# Patient Record
Sex: Male | Born: 1945 | Smoking: Former smoker
Health system: Southern US, Community
[De-identification: ages and names within clinical notes are randomized; demographics above are authoritative.]

## PROBLEM LIST (undated history)

## (undated) DIAGNOSIS — I10 Essential (primary) hypertension: Secondary | ICD-10-CM

## (undated) DIAGNOSIS — I219 Acute myocardial infarction, unspecified: Secondary | ICD-10-CM

## (undated) DIAGNOSIS — K5790 Diverticulosis of intestine, part unspecified, without perforation or abscess without bleeding: Secondary | ICD-10-CM

## (undated) DIAGNOSIS — K219 Gastro-esophageal reflux disease without esophagitis: Secondary | ICD-10-CM

## (undated) DIAGNOSIS — E079 Disorder of thyroid, unspecified: Secondary | ICD-10-CM

## (undated) DIAGNOSIS — A419 Sepsis, unspecified organism: Secondary | ICD-10-CM

## (undated) DIAGNOSIS — I2510 Atherosclerotic heart disease of native coronary artery without angina pectoris: Secondary | ICD-10-CM

## (undated) DIAGNOSIS — J449 Chronic obstructive pulmonary disease, unspecified: Secondary | ICD-10-CM

## (undated) DIAGNOSIS — K7030 Alcoholic cirrhosis of liver without ascites: Secondary | ICD-10-CM

## (undated) DIAGNOSIS — B1910 Unspecified viral hepatitis B without hepatic coma: Secondary | ICD-10-CM

## (undated) DIAGNOSIS — F32A Depression, unspecified: Secondary | ICD-10-CM

## (undated) DIAGNOSIS — F329 Major depressive disorder, single episode, unspecified: Secondary | ICD-10-CM

## (undated) DIAGNOSIS — R1310 Dysphagia, unspecified: Secondary | ICD-10-CM

## (undated) DIAGNOSIS — R339 Retention of urine, unspecified: Secondary | ICD-10-CM

## (undated) DIAGNOSIS — I4891 Unspecified atrial fibrillation: Secondary | ICD-10-CM

## (undated) HISTORY — DX: Acute myocardial infarction, unspecified: I21.9

## (undated) HISTORY — PX: COLOSTOMY: SHX63

## (undated) HISTORY — DX: Essential (primary) hypertension: I10

## (undated) HISTORY — DX: Sepsis, unspecified organism: A41.9

## (undated) HISTORY — DX: Chronic obstructive pulmonary disease, unspecified: J44.9

## (undated) HISTORY — DX: Dysphagia, unspecified: R13.10

## (undated) HISTORY — DX: Unspecified atrial fibrillation: I48.91

## (undated) HISTORY — DX: Gastro-esophageal reflux disease without esophagitis: K21.9

## (undated) HISTORY — DX: Diverticulosis of intestine, part unspecified, without perforation or abscess without bleeding: K57.90

## (undated) HISTORY — DX: Unspecified viral hepatitis B without hepatic coma: B19.10

## (undated) HISTORY — PX: PEG TUBE REMOVAL: SHX2187

## (undated) HISTORY — PX: PEG TUBE PLACEMENT: SUR1034

---

## 2013-03-03 ENCOUNTER — Encounter: Payer: Self-pay | Admitting: Internal Medicine

## 2013-03-03 ENCOUNTER — Non-Acute Institutional Stay (SKILLED_NURSING_FACILITY): Payer: Medicare Other | Admitting: Internal Medicine

## 2013-03-03 DIAGNOSIS — R5383 Other fatigue: Secondary | ICD-10-CM

## 2013-03-03 DIAGNOSIS — R5381 Other malaise: Secondary | ICD-10-CM

## 2013-03-03 DIAGNOSIS — F3289 Other specified depressive episodes: Secondary | ICD-10-CM

## 2013-03-03 DIAGNOSIS — R531 Weakness: Secondary | ICD-10-CM

## 2013-03-03 DIAGNOSIS — F329 Major depressive disorder, single episode, unspecified: Secondary | ICD-10-CM

## 2013-03-03 DIAGNOSIS — E039 Hypothyroidism, unspecified: Secondary | ICD-10-CM

## 2013-03-03 DIAGNOSIS — K219 Gastro-esophageal reflux disease without esophagitis: Secondary | ICD-10-CM

## 2013-03-03 DIAGNOSIS — F32A Depression, unspecified: Secondary | ICD-10-CM

## 2013-03-03 DIAGNOSIS — L89109 Pressure ulcer of unspecified part of back, unspecified stage: Secondary | ICD-10-CM

## 2013-03-03 DIAGNOSIS — E43 Unspecified severe protein-calorie malnutrition: Secondary | ICD-10-CM

## 2013-03-03 DIAGNOSIS — L89154 Pressure ulcer of sacral region, stage 4: Secondary | ICD-10-CM

## 2013-03-03 DIAGNOSIS — I4891 Unspecified atrial fibrillation: Secondary | ICD-10-CM

## 2013-03-03 DIAGNOSIS — L8994 Pressure ulcer of unspecified site, stage 4: Secondary | ICD-10-CM

## 2013-03-03 DIAGNOSIS — K5732 Diverticulitis of large intestine without perforation or abscess without bleeding: Secondary | ICD-10-CM

## 2013-03-03 NOTE — Progress Notes (Signed)
Patient ID: Craig GuerinWilliam [name], male   DOB: Nov 14, 1945, 68 y.o.   MRN: [id number]    [name]    PCP: No primary provider on file.  Code Status: full code  No Known Allergies  Chief Complaint: new admit  HPI:  68 y/o male patient is here for STR after hospital and [facility] with respiratory failure, malnutrition, decubitus ulcer and generalized weakness. He has history of HTN, COPD, depression, CAD, afib and was residing in a nursing home prior to this. [facility] was [facility]. He was intubated and treated for influenza a and HCAP. [facility] was [facility]/18/[facility]. He had a diverting colostomy and gastrojejunostomy to help with healing of his decubitus ulcer. He was started on TPN. He developed diverticulitis and is on tigecycline. He is here for rehabilitation. Goal is for him to return him. He is seen in his room today. He appears frail, chronically ill but in no distress. He is sitting on his wheelchair, able to answer few questions. No new concern from the staff. Therapy team have evaluated him.   Review of Systems:  Difficult to obtain from patient No fever or chills No chest pain or dyspnea Denies any pain On TPN Is out of bed to chair Denies nausea or vomiting  Past Medical History  Diagnosis Date  . COPD (chronic obstructive pulmonary disease)   . Hypertension   . Myocardial infarction   . A-fib   . GERD (gastroesophageal reflux disease)    No past surgical history on file. Social History:   reports that he has quit smoking. He does not have any smokeless tobacco history on file. He reports that he does not drink alcohol or use illicit drugs.  Family History  Problem Relation Age of Onset  . Family history unknown: Yes    Medications: Patient's Medications  New Prescriptions   No medications on file  Previous Medications   AMIODARONE (PACERONE) 200 MG TABLET    Take 200 mg by mouth daily.     ASPIRIN 81 MG TABLET    Take 81 mg by mouth daily.   ESCITALOPRAM (LEXAPRO) 10 MG TABLET    Take 10 mg by mouth daily.   FLUTICASONE FUROATE-VILANTEROL (BREO ELLIPTA) 100-25 MCG/INH AEPB    Inhale 1 each into the lungs daily.   FOLIC ACID (FOLVITE) 1 MG TABLET    Take 1 mg by mouth daily.   K PHOS MONO-SOD PHOS DI & MONO (PHOSPHA 250 NEUTRAL PO)    Take 1 capsule by mouth 2 (two) times daily.   LACTULOSE (CHRONULAC) 10 GM/15ML SOLUTION    Take 20 g by mouth daily.   LANSOPRAZOLE (PREVACID SOLUTAB) 30 MG DISINTEGRATING TABLET    Take 30 mg by mouth daily.   LEVOTHYROXINE (SYNTHROID, LEVOTHROID) 150 MCG TABLET    Take 150 mcg by mouth daily before breakfast.   MELATONIN 3 MG TABS    Take 1 tablet by mouth daily.   METOCLOPRAMIDE (REGLAN) 5 MG/5ML SOLUTION    Take 5 mg by mouth every 6 (six) hours as needed for nausea.   METOLAZONE (ZAROXOLYN) 10 MG TABLET    Take 10 mg by mouth 2 (two) times daily.   MORPHINE (ROXANOL) 20 MG/ML CONCENTRATED SOLUTION    Place 5 mg into feeding tube every 4 (four) hours as needed for severe pain.   POTASSIUM CHLORIDE (KLOR-CON) 20 MEQ PACKET    Take 20 mEq by mouth daily.  THIAMINE 100 MG TABLET    Take 100 mg by mouth daily.   TIGECYCLINE (TYGACIL) 50 MG INJECTION    Inject 50 mg into the vein 2 (two) times daily.  Modified Medications   No medications on file  Discontinued Medications   No medications on file     Physical Exam: Filed Vitals:   03/03/13 1102  BP: 118/82  Pulse: 70  Temp: 98 F (36.7 C)  Resp: 18  Height: 5\' 10"  (1.778 m)  Weight: 130 lb (58.968 kg)  SpO2: 95%   General- elderly ill appearing frail male in no acute distress Head- atraumatic, normocephalic Eyes- PERRLA, EOMI, no pallor, no icterus Neck- no lymphadenopathy Cardiovascular- irregular rate, no murmurs/ rubs/ gallops Respiratory- bilateral decreased air entry but no wheeze/ crackles or rhonchi Abdomen- bowel sounds present, soft, non tender, colostomy bag in place,  jejunostomy site clean and dry Musculoskeletal- able to move all 4 extremities, weakness present, thin built Neurological- no focal deficit Psychiatry- alert and oriented to person  Labs reviewed: 02/24/13 wbc 17.6, hb 9.7, hct 34.1, plt 373, na 149, k 4.6, bun 41, cr 1, ca 10.3, phos 1.6 02/28/13 wbc 17.7, hb 10.8, hct 34.1, plt 373, na 137, k 4, bun 73, cr 1  Assessment/Plan  Generalized weakness- with his co-morbidities, malnutrition, recent infection and wounds, he has a poor prognosis. He is here for STR but I feel he might be a long term care resident. Continue antibiotics, working with therapy team and skin care for now. Continue peg site and colostomy site care. I will see how he participates with therapy team. We will need to readdress goals of care for him with his sister depending on whether he makes improvement or not. Check cmp and cbc with diff  Severe protein calorie malnutrition- is on TPN at present. Continue this with protein supplement. Continue folic acid and thiamin supplement, potassium and phosphate supplement. Check cmp and pre-albumin level  Sacral wound- continue wound care, pressure ulcer prophylaxis and morphine prn for pain. Monitor nutritional status  Diverticulitis- continue tigecycline and complete course on 03/04/13  GERD- has hx of hiatal hernia, is on TPN. To prevent stress ulcer as well, continue lansoprazole  Hypothyroidism- continue levothyroxine 150 mcg daily  afib- rate currently controlled. Continue amiodarone and aspirin for now  Depression- will continue his lexapro, monitor mood   Family/ staff Communication: reviewed care plan with patient and nursing supervisor  Goals of care: STR   Labs/tests ordered: cbc, cmp, mg, pre-albumin  Oneal Grout, MD  [facility] [phone] (Monday-Friday 8 am - 5 pm) [phone] (afterhours)

## 2013-03-04 ENCOUNTER — Other Ambulatory Visit: Payer: Self-pay

## 2013-03-04 MED ORDER — MORPHINE SULFATE (CONCENTRATE) 20 MG/ML PO SOLN: ORAL | Status: DC

## 2013-03-04 NOTE — Telephone Encounter (Signed)
RX was received via fax and [facility]ed back to [facility] @ [phone], phone number [phone]

## 2013-03-05 DIAGNOSIS — K5790 Diverticulosis of intestine, part unspecified, without perforation or abscess without bleeding: Secondary | ICD-10-CM

## 2013-03-05 DIAGNOSIS — K219 Gastro-esophageal reflux disease without esophagitis: Secondary | ICD-10-CM | POA: Insufficient documentation

## 2013-03-05 DIAGNOSIS — E039 Hypothyroidism, unspecified: Secondary | ICD-10-CM | POA: Insufficient documentation

## 2013-03-05 DIAGNOSIS — I4891 Unspecified atrial fibrillation: Secondary | ICD-10-CM | POA: Insufficient documentation

## 2013-03-05 DIAGNOSIS — F32A Depression, unspecified: Secondary | ICD-10-CM | POA: Insufficient documentation

## 2013-03-05 DIAGNOSIS — R531 Weakness: Secondary | ICD-10-CM | POA: Insufficient documentation

## 2013-03-05 DIAGNOSIS — E43 Unspecified severe protein-calorie malnutrition: Secondary | ICD-10-CM | POA: Insufficient documentation

## 2013-03-05 DIAGNOSIS — F329 Major depressive disorder, single episode, unspecified: Secondary | ICD-10-CM | POA: Insufficient documentation

## 2013-03-05 DIAGNOSIS — L89154 Pressure ulcer of sacral region, stage 4: Secondary | ICD-10-CM | POA: Insufficient documentation

## 2013-03-05 HISTORY — DX: Diverticulosis of intestine, part unspecified, without perforation or abscess without bleeding: K57.90

## 2013-03-18 ENCOUNTER — Non-Acute Institutional Stay (SKILLED_NURSING_FACILITY): Payer: Medicare Other | Admitting: Internal Medicine

## 2013-03-18 DIAGNOSIS — R5381 Other malaise: Secondary | ICD-10-CM

## 2013-03-18 DIAGNOSIS — R52 Pain, unspecified: Secondary | ICD-10-CM

## 2013-03-18 DIAGNOSIS — L8990 Pressure ulcer of unspecified site, unspecified stage: Secondary | ICD-10-CM

## 2013-03-18 DIAGNOSIS — L8994 Pressure ulcer of unspecified site, stage 4: Secondary | ICD-10-CM

## 2013-03-18 NOTE — Progress Notes (Signed)
Patient ID: Booker Bhatnagar, male   DOB: Apr 24, 1945, 68 y.o.   MRN: [id number]    [name] living [employer]  Chief Complaint  Patient presents with  . Acute Visit    pain   No Known Allergies  HPI 68 y/o male patient seen today for his pain. He has pressure ulcers, severe protein calorie malnutrition and his pain is not under control with current pain regimen. he has history of HTN, COPD, depression, CAD, afib   Review of Systems:  No fever or chills No chest pain or dyspnea On TPN No nausea or vomiting  Past Medical History  Diagnosis Date  . COPD (chronic obstructive pulmonary disease)   . Hypertension   . Myocardial infarction   . A-fib   . GERD (gastroesophageal reflux disease)    No past surgical history on file.  Current Outpatient Prescriptions on File Prior to Visit  Medication Sig Dispense Refill  . amiodarone (PACERONE) 200 MG tablet Take 200 mg by mouth daily.      Marland Kitchen aspirin 81 MG tablet Take 81 mg by mouth daily.      Marland Kitchen escitalopram (LEXAPRO) 10 MG tablet Take 10 mg by mouth daily.      . Fluticasone Furoate-Vilanterol (BREO ELLIPTA) 100-25 MCG/INH AEPB Inhale 1 each into the lungs daily.      . folic acid (FOLVITE) 1 MG tablet Take 1 mg by mouth daily.      . K Phos Mono-Sod Phos Di & Mono (PHOSPHA 250 NEUTRAL PO) Take 1 capsule by mouth 2 (two) times daily.      Marland Kitchen lactulose (CHRONULAC) 10 GM/15ML solution Take 20 g by mouth daily.      . lansoprazole (PREVACID SOLUTAB) 30 MG disintegrating tablet Take 30 mg by mouth daily.      Marland Kitchen levothyroxine (SYNTHROID, LEVOTHROID) 150 MCG tablet Take 150 mcg by mouth daily before breakfast.      . Melatonin 3 MG TABS Take 1 tablet by mouth daily.      . metoCLOPramide (REGLAN) 5 MG/5ML solution Take 5 mg by mouth every 6 (six) hours as needed for nausea.      . metolazone (ZAROXOLYN) 10 MG tablet Take 10 mg by mouth 2 (two) times daily.      Marland Kitchen morphine (ROXANOL) 20 MG/ML concentrated solution Give 0.25 cc per G-Tube once  daily 30 minutes before dressing changes, give 0.25 ml per G-Tube every 4 hours as needed for pain  120 mL  0  . potassium chloride (KLOR-CON) 20 MEQ packet Take 20 mEq by mouth daily.      Marland Kitchen thiamine 100 MG tablet Take 100 mg by mouth daily.      . tigecycline (TYGACIL) 50 MG injection Inject 50 mg into the vein 2 (two) times daily.       No current facility-administered medications on file prior to visit.    Physical exam BP 112/58  Pulse 77  Temp(Src) 97.5 F (36.4 C)  Resp 18  Ht 5\' 10"  (1.778 m)  Wt 131 lb (59.421 kg)  BMI 18.80 kg/m2  SpO2 94%  General- elderly ill appearing frail male in no acute distress Head- atraumatic, normocephalic Eyes- PERRLA, EOMI, no pallor, no icterus Neck- no lymphadenopathy Cardiovascular- irregular rate, no murmurs/ rubs/ gallops Respiratory- bilateral decreased air entry but no wheeze/ crackles or rhonchi Abdomen- bowel sounds present, soft, non tender, colostomy bag in place, jejunostomy site clean and dry Musculoskeletal- able to move all 4 extremities, pain with change in position  Neurological- no focal deficit Skin- stage 4 sacral ulcer, right lateral foot ulcer, right ankle ulcer, ulcer also present in left ankle and foot, has stage 3 ulcer at peg tube site Psychiatry- awake and oriented to person  Labs reviewed: 02/24/13 wbc 17.6, hb 9.7, hct 34.1, plt 373, na 149, k 4.6, bun 41, cr 1, ca 10.3, phos 1.6 02/28/13 wbc 17.7, hb 10.8, hct 34.1, plt 373, na 137, k 4, bun 73, cr 1  Assessment/Plan  68 y/o male pt with severe protein calorie malnutrtion, decubitus ulcer, generalized weakness here for rehabilitation was seen today for worsening pain.   Pressure ulcer- multiple decubitus ulcer present. Continue wound care , pressure ulcer prophylaxis and TPN. Will adjust his pain medication. See below. With his poor prognosis, will also have palliative care see patient  Pain- with multiple pressure ulcers. Will change his morphine sulphate to  5 mg po every 6 hours for now and have him get 10 mg half an hour prior to dressing change. Reassess if no improvement.   Deconditioning- medical co-morbidities with his poor nutritional state and pressure ulcers has debilitated him. He has poor overall prognosis at present. He has minimal participation with therapy team. He is on TPN for nutrition and poor wound healing. Will provide palliative care referral to evaluate him further for comfort care.   Family/ staff Communication: reviewed care plan with patient and nursing supervisor   Oneal GroutMAHIMA Araina Butrick, MD  [facility] [phone] (Monday-Friday 8 am - 5 pm) [phone] (afterhours)

## 2013-03-28 ENCOUNTER — Non-Acute Institutional Stay (SKILLED_NURSING_FACILITY): Payer: Medicare Other | Admitting: Internal Medicine

## 2013-03-28 ENCOUNTER — Encounter: Payer: Self-pay | Admitting: Internal Medicine

## 2013-03-28 DIAGNOSIS — F32A Depression, unspecified: Secondary | ICD-10-CM

## 2013-03-28 DIAGNOSIS — I4891 Unspecified atrial fibrillation: Secondary | ICD-10-CM

## 2013-03-28 DIAGNOSIS — F3289 Other specified depressive episodes: Secondary | ICD-10-CM

## 2013-03-28 DIAGNOSIS — E43 Unspecified severe protein-calorie malnutrition: Secondary | ICD-10-CM

## 2013-03-28 DIAGNOSIS — F329 Major depressive disorder, single episode, unspecified: Secondary | ICD-10-CM

## 2013-03-28 DIAGNOSIS — G4700 Insomnia, unspecified: Secondary | ICD-10-CM

## 2013-03-28 DIAGNOSIS — L8994 Pressure ulcer of unspecified site, stage 4: Secondary | ICD-10-CM

## 2013-03-28 DIAGNOSIS — L89109 Pressure ulcer of unspecified part of back, unspecified stage: Secondary | ICD-10-CM

## 2013-03-28 DIAGNOSIS — K219 Gastro-esophageal reflux disease without esophagitis: Secondary | ICD-10-CM

## 2013-03-28 DIAGNOSIS — L89154 Pressure ulcer of sacral region, stage 4: Secondary | ICD-10-CM

## 2013-03-28 NOTE — Progress Notes (Signed)
Patient ID: Craig [name], male   DOB: [birth year], 68 y.o.   MRN: [id number]    [name] living [employer]  Chief Complaint  Patient presents with  . Medical Managment of Chronic Issues   No Known Allergies  HPI 68 y/o male patient is here for STR. He has history of copd with respiratory failure, malnutrition, decubitus ulcer,depression, CAD, afib among others. He is on TPN here. He has been working with therapy team. He is now on room air and using incentive spirometer. He appears in no distress. He denies any concern this visit. Difficult to obtain history and ROS from patient  ROSReview of Systems  Constitutional: Negative for fever, chills, diaphoresis.  HENT: Negative for congestion, hearing loss and sore throat.   Eyes: Negative for blurred vision, double vision and discharge.  Respiratory: Negative for cough, sputum production, shortness of breath and wheezing.   Cardiovascular: Negative for chest pain, palpitations, orthopnea and leg swelling.  Gastrointestinal: Negative for heartburn, nausea, vomiting, abdominal pain Genitourinary: Negative for dysuria Musculoskeletal: Negative for back pain, falls Skin: Negative for itching and rash. getting wound care Neurological: Positive for weakness. Negative for dizziness, tingling, focal weakness and headaches.  Psychiatric/Behavioral: Negative for depression.The patient is not nervous/anxious.    Past Medical History  Diagnosis Date  . COPD (chronic obstructive pulmonary disease)   . Hypertension   . Myocardial infarction   . A-fib   . GERD (gastroesophageal reflux disease)    Current Outpatient Prescriptions on File Prior to Visit  Medication Sig Dispense Refill  . amiodarone (PACERONE) 200 MG tablet Take 200 mg by mouth daily.      Marland Kitchen aspirin 81 MG tablet Take 81 mg by mouth daily.      Marland Kitchen escitalopram (LEXAPRO) 10 MG tablet Take 10 mg by mouth daily.      . Fluticasone Furoate-Vilanterol (BREO ELLIPTA) 100-25 MCG/INH AEPB  Inhale 1 each into the lungs daily.      . folic acid (FOLVITE) 1 MG tablet Take 1 mg by mouth daily.      Marland Kitchen lactulose (CHRONULAC) 10 GM/15ML solution Take 20 g by mouth daily.      . lansoprazole (PREVACID SOLUTAB) 30 MG disintegrating tablet Take 30 mg by mouth daily.      Marland Kitchen levothyroxine (SYNTHROID, LEVOTHROID) 150 MCG tablet Take 150 mcg by mouth daily before breakfast.      . Melatonin 3 MG TABS Take 1 tablet by mouth daily.      . metoCLOPramide (REGLAN) 5 MG/5ML solution Take 5 mg by mouth every 6 (six) hours as needed for nausea.      . metolazone (ZAROXOLYN) 10 MG tablet Take 10 mg by mouth 2 (two) times daily.      Marland Kitchen thiamine 100 MG tablet Take 100 mg by mouth daily.      . K Phos Mono-Sod Phos Di & Mono (PHOSPHA 250 NEUTRAL PO) Take 1 capsule by mouth 2 (two) times daily.       No current facility-administered medications on file prior to visit.   History reviewed. No pertinent past surgical history.  Physical exam BP 115/60  Pulse 88  Temp(Src) 97.3 F (36.3 C)  Resp 18  Ht 5\' 10"  (1.778 m)  Wt 138 lb (62.596 kg)  BMI 19.80 kg/m2  SpO2 95%  General- elderly ifrail male in no acute distress Head- atraumatic, normocephalic Eyes- PERRLA, EOMI, no pallor, no icterus Neck- no lymphadenopathy Cardiovascular- irregular rate, no murmurs/ rubs/ gallops Respiratory- bilateral decreased air entry  but no wheeze/ crackles or rhonchi Abdomen- bowel sounds present, soft, non tender, colostomy bag in place, jejunostomy site clean and dry Musculoskeletal- able to move all 4 extremities, weakness present, thin built Neurological- no focal deficit Psychiatry- alert and oriented to person  Labs reviewed: 02/24/13 wbc 17.6, hb 9.7, hct 34.1, plt 373, na 149, k 4.6, bun 41, cr 1, ca 10.3, phos 1.6 02/28/13 wbc 17.7, hb 10.8, hct 34.1, plt 373, na 137, k 4, bun 73, cr 1 03/24/13 na 132, k 3.1, bun 64, cr 1.05, glu 99  Assessment/Plan  Severe protein calorie malnutrition- is on TPN at  present. Continue this with protein supplement. Continue folic acid and thiamine supplement. D/c florastor with increased risk for fungal infection. Encourage OOB and to work with PT and OT. Skin care, fall precautions  afib- rate currently controlled. Continue amiodarone and aspirin for now  pneumonia- complete course of levofloxacin on 03/30/13, aspiration precautions  Sacral wound- continue wound care, pressure ulcer prophylaxis and morphine for pain. Recent pain med adjustment has been helpful  Depression- will continue his lexapro, monitor mood  GERD- has hx of hiatal hernia, is on TPN. continue lansoprazole and ranitidine  Insomnia- melatonin at bedtime for sleep

## 2013-04-10 DIAGNOSIS — G4700 Insomnia, unspecified: Secondary | ICD-10-CM | POA: Insufficient documentation

## 2013-04-26 ENCOUNTER — Non-Acute Institutional Stay (SKILLED_NURSING_FACILITY): Payer: Medicare Other | Admitting: Internal Medicine

## 2013-04-26 ENCOUNTER — Emergency Department (HOSPITAL_COMMUNITY): Payer: Medicare Other

## 2013-04-26 ENCOUNTER — Encounter (HOSPITAL_COMMUNITY): Payer: Self-pay | Admitting: Emergency Medicine

## 2013-04-26 ENCOUNTER — Inpatient Hospital Stay (HOSPITAL_COMMUNITY): Payer: Medicare Other

## 2013-04-26 ENCOUNTER — Encounter: Payer: Self-pay | Admitting: Internal Medicine

## 2013-04-26 ENCOUNTER — Inpatient Hospital Stay (HOSPITAL_COMMUNITY)
Admission: EM | Admit: 2013-04-26 | Discharge: 2013-05-03 | DRG: 871 | Disposition: A | Discharge status: Skilled Nursing Facility | Payer: Medicare Other | Attending: Internal Medicine | Admitting: Internal Medicine

## 2013-04-26 DIAGNOSIS — J189 Pneumonia, unspecified organism: Secondary | ICD-10-CM

## 2013-04-26 DIAGNOSIS — Z5900 Homelessness unspecified: Secondary | ICD-10-CM

## 2013-04-26 DIAGNOSIS — I10 Essential (primary) hypertension: Secondary | ICD-10-CM | POA: Diagnosis present

## 2013-04-26 DIAGNOSIS — R627 Adult failure to thrive: Secondary | ICD-10-CM | POA: Diagnosis present

## 2013-04-26 DIAGNOSIS — K5790 Diverticulosis of intestine, part unspecified, without perforation or abscess without bleeding: Secondary | ICD-10-CM

## 2013-04-26 DIAGNOSIS — K5732 Diverticulitis of large intestine without perforation or abscess without bleeding: Secondary | ICD-10-CM

## 2013-04-26 DIAGNOSIS — E039 Hypothyroidism, unspecified: Secondary | ICD-10-CM | POA: Diagnosis present

## 2013-04-26 DIAGNOSIS — J449 Chronic obstructive pulmonary disease, unspecified: Secondary | ICD-10-CM | POA: Diagnosis present

## 2013-04-26 DIAGNOSIS — Z789 Other specified health status: Secondary | ICD-10-CM | POA: Diagnosis present

## 2013-04-26 DIAGNOSIS — Z933 Colostomy status: Secondary | ICD-10-CM

## 2013-04-26 DIAGNOSIS — I4891 Unspecified atrial fibrillation: Secondary | ICD-10-CM | POA: Diagnosis present

## 2013-04-26 DIAGNOSIS — N390 Urinary tract infection, site not specified: Secondary | ICD-10-CM | POA: Diagnosis present

## 2013-04-26 DIAGNOSIS — A419 Sepsis, unspecified organism: Secondary | ICD-10-CM

## 2013-04-26 DIAGNOSIS — R042 Hemoptysis: Secondary | ICD-10-CM

## 2013-04-26 DIAGNOSIS — R740 Nonspecific elevation of levels of transaminase and lactic acid dehydrogenase [LDH]: Secondary | ICD-10-CM

## 2013-04-26 DIAGNOSIS — J4489 Other specified chronic obstructive pulmonary disease: Secondary | ICD-10-CM | POA: Diagnosis present

## 2013-04-26 DIAGNOSIS — L89154 Pressure ulcer of sacral region, stage 4: Secondary | ICD-10-CM

## 2013-04-26 DIAGNOSIS — B9689 Other specified bacterial agents as the cause of diseases classified elsewhere: Secondary | ICD-10-CM | POA: Diagnosis present

## 2013-04-26 DIAGNOSIS — K7460 Unspecified cirrhosis of liver: Secondary | ICD-10-CM | POA: Diagnosis present

## 2013-04-26 DIAGNOSIS — L8990 Pressure ulcer of unspecified site, unspecified stage: Secondary | ICD-10-CM

## 2013-04-26 DIAGNOSIS — R1310 Dysphagia, unspecified: Secondary | ICD-10-CM

## 2013-04-26 DIAGNOSIS — L8995 Pressure ulcer of unspecified site, unstageable: Secondary | ICD-10-CM | POA: Diagnosis present

## 2013-04-26 DIAGNOSIS — F32A Depression, unspecified: Secondary | ICD-10-CM | POA: Diagnosis present

## 2013-04-26 DIAGNOSIS — Z7982 Long term (current) use of aspirin: Secondary | ICD-10-CM

## 2013-04-26 DIAGNOSIS — K219 Gastro-esophageal reflux disease without esophagitis: Secondary | ICD-10-CM | POA: Diagnosis present

## 2013-04-26 DIAGNOSIS — R7401 Elevation of levels of liver transaminase levels: Secondary | ICD-10-CM | POA: Diagnosis present

## 2013-04-26 DIAGNOSIS — B1910 Unspecified viral hepatitis B without hepatic coma: Secondary | ICD-10-CM | POA: Diagnosis present

## 2013-04-26 DIAGNOSIS — R7881 Bacteremia: Secondary | ICD-10-CM

## 2013-04-26 DIAGNOSIS — R000 Tachycardia, unspecified: Secondary | ICD-10-CM | POA: Diagnosis present

## 2013-04-26 DIAGNOSIS — I252 Old myocardial infarction: Secondary | ICD-10-CM

## 2013-04-26 DIAGNOSIS — Z418 Encounter for other procedures for purposes other than remedying health state: Secondary | ICD-10-CM

## 2013-04-26 DIAGNOSIS — L8994 Pressure ulcer of unspecified site, stage 4: Secondary | ICD-10-CM | POA: Diagnosis present

## 2013-04-26 DIAGNOSIS — F3289 Other specified depressive episodes: Secondary | ICD-10-CM | POA: Diagnosis present

## 2013-04-26 DIAGNOSIS — E43 Unspecified severe protein-calorie malnutrition: Secondary | ICD-10-CM | POA: Diagnosis present

## 2013-04-26 DIAGNOSIS — R531 Weakness: Secondary | ICD-10-CM | POA: Diagnosis present

## 2013-04-26 DIAGNOSIS — Z2989 Encounter for other specified prophylactic measures: Secondary | ICD-10-CM

## 2013-04-26 DIAGNOSIS — R7402 Elevation of levels of lactic acid dehydrogenase (LDH): Secondary | ICD-10-CM | POA: Diagnosis present

## 2013-04-26 DIAGNOSIS — Z87891 Personal history of nicotine dependence: Secondary | ICD-10-CM

## 2013-04-26 DIAGNOSIS — Z931 Gastrostomy status: Secondary | ICD-10-CM

## 2013-04-26 DIAGNOSIS — D649 Anemia, unspecified: Secondary | ICD-10-CM | POA: Diagnosis present

## 2013-04-26 DIAGNOSIS — Z79899 Other long term (current) drug therapy: Secondary | ICD-10-CM

## 2013-04-26 DIAGNOSIS — L89609 Pressure ulcer of unspecified heel, unspecified stage: Secondary | ICD-10-CM | POA: Diagnosis present

## 2013-04-26 DIAGNOSIS — L97509 Non-pressure chronic ulcer of other part of unspecified foot with unspecified severity: Secondary | ICD-10-CM | POA: Diagnosis present

## 2013-04-26 DIAGNOSIS — L89899 Pressure ulcer of other site, unspecified stage: Secondary | ICD-10-CM | POA: Diagnosis present

## 2013-04-26 DIAGNOSIS — F329 Major depressive disorder, single episode, unspecified: Secondary | ICD-10-CM

## 2013-04-26 DIAGNOSIS — R64 Cachexia: Secondary | ICD-10-CM | POA: Diagnosis present

## 2013-04-26 DIAGNOSIS — J9600 Acute respiratory failure, unspecified whether with hypoxia or hypercapnia: Secondary | ICD-10-CM | POA: Diagnosis present

## 2013-04-26 DIAGNOSIS — R6510 Systemic inflammatory response syndrome (SIRS) of non-infectious origin without acute organ dysfunction: Secondary | ICD-10-CM

## 2013-04-26 DIAGNOSIS — L89109 Pressure ulcer of unspecified part of back, unspecified stage: Secondary | ICD-10-CM | POA: Diagnosis present

## 2013-04-26 HISTORY — DX: Sepsis, unspecified organism: A41.9

## 2013-04-26 LAB — CBC WITH DIFFERENTIAL/PLATELET
BASOS PCT: 0 % (ref 0–1)
Basophils Absolute: 0 10*3/uL (ref 0.0–0.1)
EOS ABS: 0.2 10*3/uL (ref 0.0–0.7)
EOS PCT: 1 % (ref 0–5)
HEMATOCRIT: 30.5 % — AB (ref 39.0–52.0)
HEMOGLOBIN: 9.8 g/dL — AB (ref 13.0–17.0)
Lymphocytes Relative: 19 % (ref 12–46)
Lymphs Abs: 3.2 10*3/uL (ref 0.7–4.0)
MCH: 25.3 pg — AB (ref 26.0–34.0)
MCHC: 32.1 g/dL (ref 30.0–36.0)
MCV: 78.8 fL (ref 78.0–100.0)
MONO ABS: 1.5 10*3/uL — AB (ref 0.1–1.0)
Monocytes Relative: 9 % (ref 3–12)
NEUTROS ABS: 12 10*3/uL — AB (ref 1.7–7.7)
Neutrophils Relative %: 71 % (ref 43–77)
Platelets: 284 10*3/uL (ref 150–400)
RBC: 3.87 MIL/uL — AB (ref 4.22–5.81)
RDW: 19.4 % — ABNORMAL HIGH (ref 11.5–15.5)
WBC: 16.9 10*3/uL — ABNORMAL HIGH (ref 4.0–10.5)

## 2013-04-26 LAB — URINALYSIS, ROUTINE W REFLEX MICROSCOPIC
Bilirubin Urine: NEGATIVE
Glucose, UA: NEGATIVE mg/dL
Ketones, ur: NEGATIVE mg/dL
NITRITE: NEGATIVE
Protein, ur: 30 mg/dL — AB
SPECIFIC GRAVITY, URINE: 1.017 (ref 1.005–1.030)
UROBILINOGEN UA: 0.2 mg/dL (ref 0.0–1.0)
pH: 7 (ref 5.0–8.0)

## 2013-04-26 LAB — URINE MICROSCOPIC-ADD ON

## 2013-04-26 LAB — COMPREHENSIVE METABOLIC PANEL
ALT: 119 U/L — ABNORMAL HIGH (ref 0–53)
AST: 131 U/L — ABNORMAL HIGH (ref 0–37)
Albumin: 2.2 g/dL — ABNORMAL LOW (ref 3.5–5.2)
Alkaline Phosphatase: 90 U/L (ref 39–117)
BILIRUBIN TOTAL: 0.2 mg/dL — AB (ref 0.3–1.2)
BUN: 42 mg/dL — ABNORMAL HIGH (ref 6–23)
CALCIUM: 10.3 mg/dL (ref 8.4–10.5)
CHLORIDE: 100 meq/L (ref 96–112)
CO2: 27 mEq/L (ref 19–32)
CREATININE: 1.09 mg/dL (ref 0.50–1.35)
GFR calc non Af Amer: 69 mL/min — ABNORMAL LOW (ref >90)
GFR, EST AFRICAN AMERICAN: 80 mL/min — AB (ref >90)
GLUCOSE: 121 mg/dL — AB (ref 70–99)
Potassium: 4.9 mEq/L (ref 3.7–5.3)
Sodium: 140 mEq/L (ref 137–147)
Total Protein: 8.7 g/dL — ABNORMAL HIGH (ref 6.0–8.3)

## 2013-04-26 LAB — CLOSTRIDIUM DIFFICILE BY PCR: Toxigenic C. Difficile by PCR: NEGATIVE

## 2013-04-26 LAB — MRSA PCR SCREENING: MRSA by PCR: NEGATIVE

## 2013-04-26 LAB — I-STAT CG4 LACTIC ACID, ED: LACTIC ACID, VENOUS: 1.52 mmol/L (ref 0.5–2.2)

## 2013-04-26 LAB — I-STAT TROPONIN, ED: Troponin i, poc: 0 ng/mL (ref 0.00–0.08)

## 2013-04-26 LAB — PRO B NATRIURETIC PEPTIDE: Pro B Natriuretic peptide (BNP): 525.4 pg/mL — ABNORMAL HIGH (ref 0–125)

## 2013-04-26 LAB — VANCOMYCIN, TROUGH: VANCOMYCIN TR: 19 ug/mL (ref 10.0–20.0)

## 2013-04-26 LAB — APTT: APTT: 35 s (ref 24–37)

## 2013-04-26 MED ORDER — ONDANSETRON HCL 4 MG PO TABS: 4.0000 mg | ORAL_TABLET | Freq: Four times a day (QID) | ORAL | Status: DC | PRN

## 2013-04-26 MED ORDER — ESCITALOPRAM OXALATE 10 MG PO TABS
10.0000 mg | ORAL_TABLET | Freq: Every day | ORAL | Status: DC
  Administered 2013-04-27: 10 mg via ORAL
  Filled 2013-04-26: qty 1

## 2013-04-26 MED ORDER — LANSOPRAZOLE 30 MG PO CPDR
30.0000 mg | DELAYED_RELEASE_CAPSULE | Freq: Every day | ORAL | Status: DC
  Administered 2013-04-27: 30 mg via ORAL
  Filled 2013-04-26: qty 1

## 2013-04-26 MED ORDER — DEXTROSE 10 % IV SOLN: INTRAVENOUS | Status: AC

## 2013-04-26 MED ORDER — LACTULOSE 10 GM/15ML PO SOLN
20.0000 g | Freq: Every day | ORAL | Status: DC
  Administered 2013-04-27: 20 g via ORAL
  Filled 2013-04-26: qty 30

## 2013-04-26 MED ORDER — FLUTICASONE FUROATE-VILANTEROL 100-25 MCG/INH IN AEPB: 1.0000 | INHALATION_SPRAY | Freq: Every day | RESPIRATORY_TRACT | Status: DC

## 2013-04-26 MED ORDER — AMIODARONE HCL 200 MG PO TABS
200.0000 mg | ORAL_TABLET | Freq: Every day | ORAL | Status: DC
  Administered 2013-04-27: 200 mg via ORAL
  Filled 2013-04-26: qty 1

## 2013-04-26 MED ORDER — METOLAZONE 10 MG PO TABS
10.0000 mg | ORAL_TABLET | Freq: Two times a day (BID) | ORAL | Status: DC
  Administered 2013-04-26: 10 mg via ORAL
  Filled 2013-04-26 (×3): qty 1

## 2013-04-26 MED ORDER — ONDANSETRON HCL 4 MG/2ML IJ SOLN
4.0000 mg | Freq: Four times a day (QID) | INTRAMUSCULAR | Status: DC | PRN
Start: 2013-04-26 — End: 2013-04-27

## 2013-04-26 MED ORDER — SODIUM CHLORIDE 0.9 % IV SOLN: INTRAVENOUS | Status: DC

## 2013-04-26 MED ORDER — DEXTROSE 5 % IV SOLN
1.0000 g | Freq: Three times a day (TID) | INTRAVENOUS | Status: DC
  Administered 2013-04-26 – 2013-04-27 (×2): 1 g via INTRAVENOUS
  Filled 2013-04-26 (×5): qty 1

## 2013-04-26 MED ORDER — HYDROGEL GEL: 1.0000 "application " | Status: DC

## 2013-04-26 MED ORDER — MOMETASONE FURO-FORMOTEROL FUM 100-5 MCG/ACT IN AERO
2.0000 | INHALATION_SPRAY | Freq: Two times a day (BID) | RESPIRATORY_TRACT | Status: DC
  Administered 2013-04-26 – 2013-05-03 (×13): 2 via RESPIRATORY_TRACT
  Filled 2013-04-26: qty 8.8

## 2013-04-26 MED ORDER — DEXTROSE 5 % IV SOLN
2.0000 g | Freq: Once | INTRAVENOUS | Status: AC
  Administered 2013-04-26: 2 g via INTRAVENOUS

## 2013-04-26 MED ORDER — VANCOMYCIN HCL 10 G IV SOLR: 1500.0000 mg | INTRAVENOUS | Status: DC

## 2013-04-26 MED ORDER — SODIUM CHLORIDE 0.9 % IJ SOLN
3.0000 mL | Freq: Two times a day (BID) | INTRAMUSCULAR | Status: DC
  Administered 2013-04-27 – 2013-05-03 (×9): 3 mL via INTRAVENOUS

## 2013-04-26 MED ORDER — VITAMIN B-1 100 MG PO TABS
100.0000 mg | ORAL_TABLET | Freq: Every day | ORAL | Status: DC
  Administered 2013-04-27: 100 mg via ORAL
  Filled 2013-04-26: qty 1

## 2013-04-26 MED ORDER — VANCOMYCIN HCL IN DEXTROSE 1-5 GM/200ML-% IV SOLN
1000.0000 mg | INTRAVENOUS | Status: DC
  Administered 2013-04-26 – 2013-04-28 (×3): 1000 mg via INTRAVENOUS
  Filled 2013-04-26 (×4): qty 200

## 2013-04-26 MED ORDER — K PHOS MONO-SOD PHOS DI & MONO 155-852-130 MG PO TABS
250.0000 mg | ORAL_TABLET | Freq: Two times a day (BID) | ORAL | Status: DC
  Administered 2013-04-26 – 2013-04-27 (×2): 250 mg via ORAL
  Filled 2013-04-26 (×3): qty 1

## 2013-04-26 MED ORDER — ALBUTEROL SULFATE (2.5 MG/3ML) 0.083% IN NEBU: 2.5000 mg | INHALATION_SOLUTION | RESPIRATORY_TRACT | Status: DC | PRN

## 2013-04-26 MED ORDER — LEVOTHYROXINE SODIUM 150 MCG PO TABS
150.0000 ug | ORAL_TABLET | Freq: Every day | ORAL | Status: DC
  Administered 2013-04-27: 150 ug via ORAL
  Filled 2013-04-26 (×2): qty 1

## 2013-04-26 MED ORDER — POTASSIUM CHLORIDE 20 MEQ PO PACK
20.0000 meq | PACK | Freq: Two times a day (BID) | ORAL | Status: DC
  Filled 2013-04-26 (×2): qty 1

## 2013-04-26 MED ORDER — MORPHINE SULFATE (CONCENTRATE) 10 MG /0.5 ML PO SOLN
5.0000 mg | Freq: Four times a day (QID) | ORAL | Status: DC
  Administered 2013-04-26 – 2013-04-29 (×11): 5 mg
  Filled 2013-04-26 (×10): qty 0.5

## 2013-04-26 MED ORDER — ENOXAPARIN SODIUM 40 MG/0.4ML ~~LOC~~ SOLN
40.0000 mg | SUBCUTANEOUS | Status: DC
  Administered 2013-04-26 – 2013-05-02 (×7): 40 mg via SUBCUTANEOUS
  Filled 2013-04-26 (×8): qty 0.4

## 2013-04-26 MED ORDER — ASPIRIN EC 81 MG PO TBEC
81.0000 mg | DELAYED_RELEASE_TABLET | Freq: Every day | ORAL | Status: DC
  Administered 2013-04-27: 81 mg via ORAL
  Filled 2013-04-26: qty 1

## 2013-04-26 MED ORDER — FOLIC ACID 1 MG PO TABS
1.0000 mg | ORAL_TABLET | Freq: Every day | ORAL | Status: DC
  Administered 2013-04-27: 1 mg via ORAL
  Filled 2013-04-26: qty 1

## 2013-04-26 MED ORDER — SODIUM CHLORIDE 0.9 % IV SOLN
INTRAVENOUS | Status: DC
  Administered 2013-04-27: 05:00:00 via INTRAVENOUS

## 2013-04-26 MED ORDER — MORPHINE SULFATE (CONCENTRATE) 10 MG /0.5 ML PO SOLN
10.0000 mg | Freq: Every day | ORAL | Status: DC | PRN
  Filled 2013-04-26: qty 0.5

## 2013-04-26 MED ORDER — MELATONIN 3 MG PO TABS: 1.0000 | ORAL_TABLET | Freq: Every day | ORAL | Status: DC

## 2013-04-26 MED ORDER — SODIUM CHLORIDE 0.9 % IV SOLN
1000.0000 mL | INTRAVENOUS | Status: DC
  Administered 2013-04-26: 1000 mL via INTRAVENOUS

## 2013-04-26 MED ORDER — COLLAGENASE 250 UNIT/GM EX OINT
1.0000 "application " | TOPICAL_OINTMENT | Freq: Three times a day (TID) | CUTANEOUS | Status: DC
  Administered 2013-04-26 – 2013-04-28 (×6): 1 via TOPICAL
  Filled 2013-04-26: qty 30

## 2013-04-26 MED ORDER — IOHEXOL 350 MG/ML SOLN
100.0000 mL | Freq: Once | INTRAVENOUS | Status: AC | PRN
  Administered 2013-04-26: 100 mL via INTRAVENOUS

## 2013-04-26 NOTE — ED Notes (Signed)
NOTIFIED DR. ZACKOWSKI IN PERSON OF PATIENTS LAB RESULTS OF CG4+ LACTIC ACID ,@ 12:00 PM , 04/26/2013.

## 2013-04-26 NOTE — Progress Notes (Signed)
PHARMACIST - PHYSICIAN ORDER COMMUNICATION  CONCERNING: [employer] Medication Policy on Herbal Medications  DESCRIPTION:  This patient's order for:  Melatonin  has been noted.  This product(s) is classified as an "herbal" or natural product. Due to a lack of definitive safety studies or FDA approval, nonstandard manufacturing practices, plus the potential risk of unknown drug-drug interactions while on inpatient medications, the Pharmacy and Therapeutics Committee does not permit the use of "herbal" or natural products of this type within [facility].   ACTION TAKEN: The pharmacy department is unable to verify this order at this time and your patient has been informed of this safety policy. Please reevaluate patient's clinical condition at discharge and address if the herbal or natural product(s) should be resumed at that time.   Margie BilletErika K. von Vajna, PharmD Clinical Pharmacist - Resident Pager: [phone] Pharmacy: [phone] 04/26/2013 7:28 PM

## 2013-04-26 NOTE — H&P (Addendum)
[facility] History and Physical  Craig GuerinWilliam [name] JXB:[id number] DOB: 03/14/1945 DOA: 04/26/2013  Referring physician: Dr. Gustavus [name] PCP: Craig GroutMahima Pandey, MD  Chief Complaint:  Sent from skilled nursing facility for fever and hypoxia since one day  History obtained from ED physician and physician notes from skilled nursing facility  HPI:  68 year old African American male with history of diverticulitis status post colostomy, A. fib not on anticoagulation, hypothyroidism, GERD, depression, severe protein calorie malnutrition, stage IV sacral decubitus ulcer and bilateral heel and ankle ulcers, generalized weakness who is a resident of skilled nursing facility was sent from there with fever one day back and episode of hemoptysis with findings of hypoxia on room air. Patient was admitted to skilled nursing facility from [facility] about 2 months back where he had been managed for respiratory failure [facility] with HCAP and influenza. He had a diverting colostomy and GJ tube to help with the healing of his stage IV sacral decubitus ulcer. He is being followed by Dr. Lorenz CoasterKeller with [facility] and currently has a wound vac on his sacral wound.Patient also has a multiple pressure ulcers over the ankles, heels  and foot. Patient has a left sided PICC line with TPN and was treated with IV antibiotics for pneumonia when he first came to the skilled nursing facility. He was treated with Levaquin and a 2 weeks course of vancomycin again I recurrent pneumonia. Patient completed his course of vancomycin only yesterday.  Patient reportedly had a fever overnight and also had an episode of hemoptysis. (Blood-tinged yellow sputum).  This morning he was found to be in respiratory distress and tachycardic . His O2 sats dropped to 81% on room air. Blood work done that showed leukocytosis with WBC of 15,000. Patient placed on 2 L via nasal cannula and O2 sats improved we'll  91%. Patient denied any headache,nausea , vomiting, chest pain, palpitations, SOB, abdominal pain. Did not have any increased output from colostomy.  At baseline he is nonambulatory.  Patient sent to the ED with concern for sepsis and hypoxia.  Course in the ED Patient had a temperature of 100.3F. He had normal respiratory rate and blood pressure. O2 sats were 82% on room air and improved to 97% via nasal cannula. Was unremarkable for any infiltrates. Blood work done showed leukocytosis with WBC of 16.9, hemoglobin of 9.8 (no baseline to compare), platelets of 284. Chemistry was normal except for BUN of 42 and transaminitis with AST of 131, ALT of 119. Lactic acid was normal. Initial troponin was negative. Pro BNP was 588 UA was suggestive of UTI.  Patient given a dose of IV cefepime and [facility] consulted for admission post abdominal. CT angiogram of the chest ordered by ED physician given acute hypoxic respiratory failure, tachycardia and hemoptysis to rule out acute PE A chest x-ray done     Review of Systems:  As outlined in history of present illness. Review of systems difficult to obtain as patient is a poor historian   Past Medical History  Diagnosis Date  . COPD (chronic obstructive pulmonary disease)   . Hypertension   . Myocardial infarction   . A-fib   . GERD (gastroesophageal reflux disease)    History reviewed. No pertinent past surgical history. Social History:  reports that he has quit smoking. He does not have any smokeless tobacco history on file. He reports that he does not drink alcohol or use illicit drugs.  No Known Allergies  History  reviewed. No pertinent family history.  Prior to Admission medications   Medication Sig Start Date End Date Taking? Authorizing Provider  amiodarone (PACERONE) 200 MG tablet Take 200 mg by mouth daily.   Yes Historical Provider, MD  aspirin 81 MG tablet Take 81 mg by mouth daily.   Yes Historical Provider, MD   Carbomer Gel Base (HYDROGEL) GEL 1 application by Does not apply route every other day. To left ankle   Yes Historical Provider, MD  Carbomer Gel Base (HYDROGEL) GEL Apply 1 application topically every other day. To right ankle   Yes Historical Provider, MD  collagenase (SANTYL) ointment Apply 1 application topically 3 (three) times daily. To right plantar surface   Yes Historical Provider, MD  [employer] CALCIUM ALGINATE 4"X4" EX Apply 1 application topically. To left heel   Yes Historical Provider, MD  escitalopram (LEXAPRO) 10 MG tablet Take 10 mg by mouth daily.   Yes Historical Provider, MD  Fluticasone Furoate-Vilanterol (BREO ELLIPTA) 100-25 MCG/INH AEPB Inhale 1 puff into the lungs daily.    Yes Historical Provider, MD  folic acid (FOLVITE) 1 MG tablet Take 1 mg by mouth daily.   Yes Historical Provider, MD  K Phos Mono-Sod Phos Di & Mono (PHOSPHA 250 NEUTRAL PO) Take 1 capsule by mouth 2 (two) times daily.   Yes Historical Provider, MD  lactulose (CHRONULAC) 10 GM/15ML solution Take 20 g by mouth daily.   Yes Historical Provider, MD  lansoprazole (PREVACID SOLUTAB) 30 MG disintegrating tablet Take 30 mg by mouth daily.   Yes Historical Provider, MD  levothyroxine (SYNTHROID, LEVOTHROID) 150 MCG tablet Take 150 mcg by mouth daily before breakfast.   Yes Historical Provider, MD  Melatonin 3 MG TABS Take 1 tablet by mouth daily.   Yes Historical Provider, MD  metoCLOPramide (REGLAN) 5 MG/5ML solution Take 5 mg by mouth every 6 (six) hours as needed for nausea.   Yes Historical Provider, MD  metolazone (ZAROXOLYN) 10 MG tablet Take 10 mg by mouth 2 (two) times daily.   Yes Historical Provider, MD  morphine (ROXANOL) 20 MG/ML concentrated solution Place 5 mg into feeding tube every 6 (six) hours. Give 10 mg per G-Tube once daily 30 minutes before dressing changes 03/04/13  Yes Tieshia A Durant, NP  Multiple Vitamins-Minerals (DECUBI-VITE PO) Take 1 capsule by mouth daily.   Yes Historical Provider, MD   potassium chloride (KLOR-CON) 20 MEQ packet 20 mEq by PEG Tube route 2 (two) times daily.   Yes Historical Provider, MD  thiamine 100 MG tablet Take 100 mg by mouth daily.   Yes Historical Provider, MD     Physical Exam:  Filed Vitals:   04/26/13 1530 04/26/13 1534 04/26/13 1600 04/26/13 1630  BP: 137/77 137/77 155/66 163/69  Pulse: 92 112 110 109  Temp:      TempSrc:      Resp: 18 16 20 18   SpO2: 100% 93% 96% 97%    Constitutional: Vital signs reviewed.  Patient is an elderly male lying in bed in no acute distress HEENT: no pallor, temporal wasting, no icterus, poor dentition, dry oral mucosa no cervical lymphadenopathy Cardiovascular: S1-S2 tachycardic, no murmurs rub or gallop Chest: Bilateral equal air entry, scattered rhonchi Abdominal: Colostomy in place with liquid brown stool, g tube in place, ND, NT, BS+ sacral wound VAC in place No CVA tenderness  Ext: warm, multiple ulceration over the ankles , heels and plantar aspect of b/l feet Neurological: Alert and awake, appears oriented but has poor  speech  Labs on Admission:  Basic Metabolic Panel:  Recent Labs Lab 04/26/13 1135  NA 140  K 4.9  CL 100  CO2 27  GLUCOSE 121*  BUN 42*  CREATININE 1.09  CALCIUM 10.3   Liver Function Tests:  Recent Labs Lab 04/26/13 1135  AST 131*  ALT 119*  ALKPHOS 90  BILITOT 0.2*  PROT 8.7*  ALBUMIN 2.2*   No results found for this basename: LIPASE, AMYLASE,  in the last 168 hours No results found for this basename: AMMONIA,  in the last 168 hours CBC:  Recent Labs Lab 04/26/13 1135  WBC 16.9*  NEUTROABS 12.0*  HGB 9.8*  HCT 30.5*  MCV 78.8  PLT 284   Cardiac Enzymes: No results found for this basename: CKTOTAL, CKMB, CKMBINDEX, TROPONINI,  in the last 168 hours BNP: No components found with this basename: POCBNP,  CBG: No results found for this basename: GLUCAP,  in the last 168 hours  Radiological Exams on Admission: Dg Chest Portable 1 View  04/26/2013    CLINICAL DATA:  Shortness of breath  EXAM: PORTABLE CHEST - 1 VIEW  COMPARISON:  None.  FINDINGS: Cardiac shadow is within normal limits. The lungs are clear bilaterally. A left-sided PICC line is within normal limits. No bony abnormality is noted.  IMPRESSION: No acute abnormality noted.   Electronically Signed   By: Alcide CleverMark  Lukens M.D.   On: 04/26/2013 12:34    EKG: Sinus tachycardia at 109, no ST T changes  Assessment/Plan Principal Problem:   SIRS (systemic inflammatory response syndrome) Broad etiology including sepsis due to UTI, wound infection,? PE/pneumonia. Admit to step down for closer monitoring Patient given a dose of IV cefepime in the ED. I will place him on empiric vancomycin and cefepime. Follow blood culture and wound culture from the feet. Urine culture sent from the ED. Check stool for C. difficile Given concern for hypoxia and hemoptysis plan to obtain a CT angiogram of the chest to rule out for PE.   Active Problems: Acute hypoxic respiratory failure and ?hemoptysis Patient maintaining O2 sat on 2 L via nasal cannula. Has bilateral rhonchi on exam. No signs of infiltrate on chest x-ray. Continue empiric antibiotics and albuterol nebs. Follow H&H (no baseline available). Follow CT angiogram of the chest    UTI (lower urinary tract infection) On empiric cefepime. Follow urine culture. Has chronic Foley.      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 Has good colostomy output.    Unspecified hypothyroidism Continue Synthroid. Check TSH    Sacral decubitus ulcer, stage IV Has a wound VAC    Depression Continue Lexapro   A-fib Currently rate controlled. Continue amiodarone  Anemia Hemoglobin at the facility  yesterday was 8.4. No other baseline available. Monitor closely    GERD (gastroesophageal reflux disease)     Pressure ulcer of foot Does not appear clinically infected. Obtain wound culture. sentyl dressing . Wound care consult. Continue pain control with home dose of roxanol    Cirrhosis of liver Moderate transaminitis noted. No baseline to compare. Monitor for now.    Diet:TPN  DVT prophylaxis: sq lovenox   Code Status: full code Family Communication: None at bedside Disposition Plan: return to SNF upon discharge  Kalli Greenfield [facility] Pager [phone]  Total time spent on admission :70 minutes  If 7PM-7AM, please contact night-coverage [email or web address] Password [email or web address] 04/26/2013, 4:40 PM

## 2013-04-26 NOTE — ED Provider Notes (Addendum)
Medical screening examination/treatment/procedure(s) were conducted as a shared visit with non-physician practitioner(s) and myself.  I personally evaluated the patient during the encounter.   EKG Interpretation None        Date: 04/26/2013  Rate: 109  Rhythm: sinus tachycardia  QRS Axis: normal  Intervals: normal  ST/T Wave abnormalities: nonspecific ST/T changes  Conduction Disutrbances:none  Narrative Interpretation:   Old EKG Reviewed: none available  Did not cross over into MUSE  Results for orders placed during the hospital encounter of 04/26/13  CBC WITH DIFFERENTIAL      Result Value Ref Range   WBC 16.9 (*) 4.0 - 10.5 K/uL   RBC 3.87 (*) 4.22 - 5.81 MIL/uL   Hemoglobin 9.8 (*) 13.0 - 17.0 g/dL   HCT 78.230.5 (*) 95.639.0 - 21.352.0 %   MCV 78.8  78.0 - 100.0 fL   MCH 25.3 (*) 26.0 - 34.0 pg   MCHC 32.1  30.0 - 36.0 g/dL   RDW 08.619.4 (*) 57.811.5 - 46.915.5 %   Platelets 284  150 - 400 K/uL   Neutrophils Relative % 71  43 - 77 %   Lymphocytes Relative 19  12 - 46 %   Monocytes Relative 9  3 - 12 %   Eosinophils Relative 1  0 - 5 %   Basophils Relative 0  0 - 1 %   Neutro Abs 12.0 (*) 1.7 - 7.7 K/uL   Lymphs Abs 3.2  0.7 - 4.0 K/uL   Monocytes Absolute 1.5 (*) 0.1 - 1.0 K/uL   Eosinophils Absolute 0.2  0.0 - 0.7 K/uL   Basophils Absolute 0.0  0.0 - 0.1 K/uL   RBC Morphology POLYCHROMASIA PRESENT     WBC Morphology MILD LEFT SHIFT (1-5% METAS, OCC MYELO, OCC BANDS)    COMPREHENSIVE METABOLIC PANEL      Result Value Ref Range   Sodium 140  137 - 147 mEq/L   Potassium 4.9  3.7 - 5.3 mEq/L   Chloride 100  96 - 112 mEq/L   CO2 27  19 - 32 mEq/L   Glucose, Bld 121 (*) 70 - 99 mg/dL   BUN 42 (*) 6 - 23 mg/dL   Creatinine, Ser 6.291.09  0.50 - 1.35 mg/dL   Calcium 52.810.3  8.4 - 41.310.5 mg/dL   Total Protein 8.7 (*) 6.0 - 8.3 g/dL   Albumin 2.2 (*) 3.5 - 5.2 g/dL   AST 244131 (*) 0 - 37 U/L   ALT 119 (*) 0 - 53 U/L   Alkaline Phosphatase 90  39 - 117 U/L   Total Bilirubin 0.2 (*) 0.3 - 1.2  mg/dL   GFR calc non Af Amer 69 (*) >90 mL/min   GFR calc Af Amer 80 (*) >90 mL/min  URINALYSIS, ROUTINE W REFLEX MICROSCOPIC      Result Value Ref Range   Color, Urine YELLOW  YELLOW   APPearance CLOUDY (*) CLEAR   Specific Gravity, Urine 1.017  1.005 - 1.030   pH 7.0  5.0 - 8.0   Glucose, UA NEGATIVE  NEGATIVE mg/dL   Hgb urine dipstick MODERATE (*) NEGATIVE   Bilirubin Urine NEGATIVE  NEGATIVE   Ketones, ur NEGATIVE  NEGATIVE mg/dL   Protein, ur 30 (*) NEGATIVE mg/dL   Urobilinogen, UA 0.2  0.0 - 1.0 mg/dL   Nitrite NEGATIVE  NEGATIVE   Leukocytes, UA LARGE (*) NEGATIVE  PRO B NATRIURETIC PEPTIDE      Result Value Ref Range   Pro B Natriuretic  peptide (BNP) 525.4 (*) 0 - 125 pg/mL  URINE MICROSCOPIC-ADD ON      Result Value Ref Range   Squamous Epithelial / LPF RARE  RARE   WBC, UA TOO NUMEROUS TO COUNT  <3 WBC/hpf   RBC / HPF 3-6  <3 RBC/hpf   Bacteria, UA MANY (*) RARE   Crystals CA OXALATE CRYSTALS (*) NEGATIVE  APTT      Result Value Ref Range   aPTT 35  24 - 37 seconds  I-STAT CG4 LACTIC ACID, ED      Result Value Ref Range   Lactic Acid, Venous 1.52  0.5 - 2.2 mmol/L  I-STAT TROPOININ, ED      Result Value Ref Range   Troponin i, poc 0.00  0.00 - 0.08 ng/mL   Comment 3            Dg Chest Portable 1 View  04/26/2013   CLINICAL DATA:  Shortness of breath  EXAM: PORTABLE CHEST - 1 VIEW  COMPARISON:  None.  FINDINGS: Cardiac shadow is within normal limits. The lungs are clear bilaterally. A left-sided PICC line is within normal limits. No bony abnormality is noted.  IMPRESSION: No acute abnormality noted.   Electronically Signed   By: Alcide CleverMark  Lukens M.D.   On: 04/26/2013 12:34    Patient with a pre-septic kind of picture. Lactic acid is not significantly elevated so not full-blown sepsis. The persistent tachycardia. Patient treated initially as if it could be urosepsis. Urinalysis consistent with a significant urinary tract infection. Patient will require admission.  Patient's nursing home physician is Dr. Glade LloydPandey.  Condition the patient's initial concern was for the shortness of breath and some coughing up some blood. With tachycardia and some hypoxia even a chest x-ray is negative we'll get CT angios chest to further evaluate the chest and to rule out PE. This all discussed with the admitting hospitalist team. Has been ordered.   Shelda JakesScott W. Kaneshia Cater, MD 04/26/13 1122  Shelda JakesScott W. Destiney Sanabia, MD 04/26/13 1555  Shelda JakesScott W. Layken Doenges, MD 04/26/13 [phone]

## 2013-04-26 NOTE — ED Notes (Signed)
Admitting doctor at bedside 

## 2013-04-26 NOTE — Progress Notes (Addendum)
ANTIBIOTIC CONSULT NOTE - INITIAL  Pharmacy Consult for Cefepime Indication: sepsis - suspected HCAP  No Known Allergies  Patient Measurements:    Vital Signs: Temp: 100.6 F (38.1 C) (04/14 1113) Temp src: Rectal (04/14 1113) BP: 153/65 mmHg (04/14 1245) Pulse Rate: 108 (04/14 1245) Intake/Output from previous day:   Intake/Output from this shift:    Labs:  Recent Labs  04/26/13 1135  WBC 16.9*  HGB 9.8*  PLT 284  CREATININE 1.09   The CrCl is unknown because both a height and weight (above a minimum accepted value) are required for this calculation. No results found for this basename: VANCOTROUGH, VANCOPEAK, VANCORANDOM, GENTTROUGH, GENTPEAK, GENTRANDOM, TOBRATROUGH, TOBRAPEAK, TOBRARND, AMIKACINPEAK, AMIKACINTROU, AMIKACIN,  in the last 72 hours   Microbiology: No results found for this or any previous visit (from the past 720 hour(s)).  Medical History: Past Medical History  Diagnosis Date  . COPD (chronic obstructive pulmonary disease)   . Hypertension   . Myocardial infarction   . A-fib   . GERD (gastroesophageal reflux disease)     Medications:  See electronic med rec  Assessment: 68 y.o. male presents from [facility] coughing up blood-tinged sputum, tachycardic, tachypneic. Pt with h/o recurrent PNA - received Levaquin and then Vancomycin to complete 2 weeks course. Just finished antibiotics yesterday per NH MD note. Pt with PICC line and chronic TPN. UA shows UTI. To begin Cefepime for sepsis.  Estimated CrCl 65 ml/min  Goal of Therapy:  Resolution of infection  Plan:  1. Cefepime 2gm IV now then 1gm IV q8h. 2. Will f/u micro data, renal function, pt's clinical condition 3. If suspect HCAP, will need addition of MRSA coverage   Christoper Fabianaron Amend, PharmD, BCPS Clinical pharmacist, pager [phone] 04/26/2013,1:47 PM   Addendum: Continuing vancomycin. Had just been stopped yesterday and pt received a dose last night. A trough was checked today  and is therapeutic at 19.  Pt was also on chronic TPN PTA. This was ordered to be resumed. However, it was ordered after cutoff time to prepare TPN so TPN will be resumed tomorrow  Plan: 1. Resume previous dose of 1gm IV Q24H 2. F/u renal fxn, C&S, clinical status and recheck trough as appropriate 3. Restart TPN 4/15 4. Start D10 at 10295ml/hr when current TPN runs out 5. TPN labs in AM and consult RD  Lysle Pearlachel Ryenne Lynam, PharmD, BCPS Pager # [phone] 04/26/2013 8:30 PM

## 2013-04-26 NOTE — ED Notes (Signed)
Spoke with sheila in main lab states she will add APTT onto blood work sent down.

## 2013-04-26 NOTE — ED Notes (Signed)
Pt arrives via EMS from [name] with SOB and bright red bloody cough today. NH sats 81% RA. Placed on [place] at 4L sats 98%. Pt awake, alert, orientedx4. PICC line L arm infusing TPN, GTube, colostomy, wound vac to sacrum, wounds to bilateral feet.

## 2013-04-26 NOTE — Progress Notes (Signed)
Patient ID: Craig GuerinWilliam [name], male   DOB: [birth year], 68 y.o.   MRN: [id number]  Location:  [facility] Provider:  Gwenith Spitziffany L. Renato Gailseed, D.O., C.M.D.  Code Status:  FULL CODE   Chief Complaint  Patient presents with  . Acute Visit    tachycardic, tachypneic, coughing up blood-tinged sputum    HPI:  68 yo frail black male with h/o diverticulitis, afib, hypothyroidism, GERD, depression, severe protein calorie malnutrition, and generalized weakness was admitted here for short term rehab 03/03/13 from [facility] where he had been for respiratory failure [facility] and [facility].  He had a diverting colostomy and GJ tube to help with the healing of his stage IV sacral decubitus ulcer.  He is being followed by Dr. Lorenz CoasterKeller with [facility] and currently has a wound vac on his sacral wound.  He had initially come to us on IV antibiotics for pneumonia, then had recurrence of the same for which he received levaquin and finally  was placed back on vancomycin for a two week course which he completed yesterday.  He has not done well.  He has numerous other areas of skin breakdown including his right plantar, right and left ankles and bilateral heels.  There has been no evidence of infection at these sites.    I was asked to see him today due to a fever overnight.  He just completed his latest course of vancomycin for pneumonia yesterday.  He has a PICC in his left arm for his TPN that he continues to receive.  His wounds did not have any signs of infection per wound care notes (copies sent along with latest labs, xray and all active orders to the hospital with pt).    When seen, he initially said he did not want to go to the hospital.  His prognosis is quite poor considering his nutritional state, and it appears, he has really not made much progress in the past 2 mos here.  He had just coughed up yellow sputum that was blood-tinged, he was in respiratory  distress, tachycardic, afebrile at this point.  His labs reveal worsened leukocytosis to 15, his lungs are congested and he is coughing.  His CXR showed only right lower lobe atelectasis on 04/25/13.   I asked nursing staff to send him out and he did then agree to go.    Review of Systems:  Review of Systems  Constitutional: Positive for fever, weight loss and malaise/fatigue.  HENT: Negative for congestion.   Respiratory: Positive for cough, hemoptysis, sputum production and shortness of breath.   Cardiovascular: Negative for chest pain.  Gastrointestinal: Negative for abdominal pain.  Genitourinary:       Foley in place  Skin:       Multiple ulcers  Neurological: Positive for weakness.  Endo/Heme/Allergies: Bruises/bleeds easily.  Psychiatric/Behavioral: Positive for depression.    Medications: Patient's Medications  New Prescriptions   No medications on file  Previous Medications   AMIODARONE (PACERONE) 200 MG TABLET    Take 200 mg by mouth daily.   ASPIRIN 81 MG TABLET    Take 81 mg by mouth daily.   CARBOMER GEL BASE (HYDROGEL) GEL    1 application by Does not apply route every other day. To left ankle   CARBOMER GEL BASE (HYDROGEL) GEL    Apply 1 application topically every other day. To right ankle   COLLAGENASE (SANTYL) OINTMENT    Apply 1 application topically  3 (three) times daily. To right plantar surface   [employer] CALCIUM ALGINATE 4"X4" EX    Apply 1 application topically. To left heel   ESCITALOPRAM (LEXAPRO) 10 MG TABLET    Take 10 mg by mouth daily.   FLUTICASONE FUROATE-VILANTEROL (BREO ELLIPTA) 100-25 MCG/INH AEPB    Inhale 1 each into the lungs daily.   FOLIC ACID (FOLVITE) 1 MG TABLET    Take 1 mg by mouth daily.   K PHOS MONO-SOD PHOS DI & MONO (PHOSPHA 250 NEUTRAL PO)    Take 1 capsule by mouth 2 (two) times daily.   LACTULOSE (CHRONULAC) 10 GM/15ML SOLUTION    Take 20 g by mouth daily.   LANSOPRAZOLE (PREVACID SOLUTAB) 30 MG DISINTEGRATING TABLET    Take 30 mg by  mouth daily.   LEVOTHYROXINE (SYNTHROID, LEVOTHROID) 150 MCG TABLET    Take 150 mcg by mouth daily before breakfast.   MELATONIN 3 MG TABS    Take 1 tablet by mouth daily.   METOCLOPRAMIDE (REGLAN) 5 MG/5ML SOLUTION    Take 5 mg by mouth every 6 (six) hours as needed for nausea.   METOLAZONE (ZAROXOLYN) 10 MG TABLET    Take 10 mg by mouth 2 (two) times daily.   MORPHINE (ROXANOL) 20 MG/ML CONCENTRATED SOLUTION    Place 5 mg into feeding tube every 6 (six) hours. Give 10 mg per G-Tube once daily 30 minutes before dressing changes   MULTIPLE VITAMINS-MINERALS (DECUBI-VITE PO)    Take 1 capsule by mouth daily.   POTASSIUM CHLORIDE (KLOR-CON) 20 MEQ PACKET    20 mEq by PEG Tube route 2 (two) times daily.   THIAMINE 100 MG TABLET    Take 100 mg by mouth daily.  Modified Medications   No medications on file  Discontinued Medications   LEVOFLOXACIN (LEVAQUIN) 500 MG TABLET    Take 500 mg by mouth daily.    Physical Exam: Filed Vitals:   04/26/13 1028  BP: 146/70  Pulse: 24  Temp: 98.6 F (37 C)  Resp: 24  Height: 5\' 10"  (1.778 m)  Weight: 144 lb (65.318 kg)  SpO2: 82%  Physical Exam  Constitutional:  Frail black male in bed; has left PICC line with lipids running w/o erythema, warmth, drainage of site, PEG tube in place with black discoloration of tubing, no erythema or warmth, colostomy with soft brown stool, foley with dark yellow urine, wound vac in place on sacral wound;  kerlix wraps of bilateral ankles/heels  HENT:  Head: Normocephalic and atraumatic.  Eyes: Pupils are equal, round, and reactive to light.  Neck: Neck supple. No JVD present.  Cardiovascular: Regular rhythm.   tachycardic  Pulmonary/Chest:  Hypoxic until oxygen placed;  Coarse rhonchi present bilaterally  Abdominal: Soft. Bowel sounds are normal. He exhibits no distension. There is no tenderness.  Musculoskeletal: Normal range of motion. He exhibits no edema.  Neurological: He is alert.  Does not speak much even  when asked questions (apparently this is not a change)  Skin:  See constitutional  Psychiatric:  Flat affect     Labs reviewed: 04/18/13:  Wbc 14.1, h/h 8.8/28.5, prealb 16.4, AST 48, ALT 65, alb 2.4  04/25/13:  Wbc 15, h/h 8.4/26.5, plts 333, phos 4, Mg 1.7, TG 97, prealb 13.5, Na 139, K 4.6, BUN 42, cr 1.07, AST 52, ALT 59, AP 68, alb 2.4, Ca 9.5  Significant Diagnostic Results: 04/25/13 CXR:  Right basilar atelectasis Reviewed [facility] notes re: wounds  Assessment/Plan 1. SIRS (systemic  inflammatory response syndrome) -suspect this is due to HCAP, ? Empyema -also could have PE due to immobility, inflammation -sent out for evaluation due to acute presentation   2. Hemoptysis -could be pneumonia or bronchitis related vs. PE -needs CT chest  3. HCAP (healthcare-associated pneumonia) -highly suspected though CXR was negative yesterday  Family/ staff Communication: discussed plan with nursing staff  Goals of care: full code--appears palliative care order did not take place and hospice was not possible as he was on his rehab benefit--hospice is my recommendation at this point, but patient was unstable and this was not in place when I saw him so he had to be sent to the hospital for intervention  Labs/tests ordered:  Send to ED for suspected  pneumonia vs. PE;  Recommend palliative care/hospice assessment

## 2013-04-26 NOTE — Progress Notes (Signed)
Pt arrived from ED, VSS, oriented to unit routine, wrapped foot ulcers-wound consult in place, will continue to monitor.

## 2013-04-26 NOTE — ED Provider Notes (Signed)
CSN: [id number]     Arrival date & time 04/26/13  1043 History   First MD Initiated Contact with Patient 04/26/13 1047     Chief Complaint  Patient presents with  . Shortness of Breath  . Hemoptysis     (Consider location/radiation/quality/duration/timing/severity/associated sxs/prior Treatment) Patient is a 68 y.o. male presenting with shortness of breath.  Shortness of Breath Associated symptoms: cough and wheezing    68 yo male with recent hx of PNA being treated with IV Vancomycin for the past 2 weeks at [facility]. Patient presents today with worsening SOB over the past 2 days and reports of blood tinged sputum per the nursing home. Patient is poor historian. Contacted [name], who states they received pateitn on 03/01/13 with PNA in addition to multiple decubitus ulcers. Patient is currently NPO with Foley catheter, colostomy, and TPN.  Nursing staff reports patients has luekocytosis increased from 14 to 15 over the past 24 hours. Patient sent to ED today due to reports of the blood tinged sputum. Patient level 5 caveat due to poor historian.  Past Medical History  Diagnosis Date  . COPD (chronic obstructive pulmonary disease)   . Hypertension   . Myocardial infarction   . A-fib   . GERD (gastroesophageal reflux disease)    History reviewed. No pertinent past surgical history. History reviewed. No pertinent family history. History  Substance Use Topics  . Smoking status: Former Games developermoker  . Smokeless tobacco: Not on file  . Alcohol Use: No    Review of Systems  Unable to perform ROS: Other  Respiratory: Positive for cough, shortness of breath and wheezing.   Skin: Positive for wound.      Allergies  Review of patient's allergies indicates no known allergies.  Home Medications   Prior to Admission medications   Medication Sig Start Date End Date Taking? Authorizing Provider  amiodarone (PACERONE) 200 MG tablet Take 200 mg by mouth daily.     Historical Provider, MD  aspirin 81 MG tablet Take 81 mg by mouth daily.    Historical Provider, MD  Carbomer Gel Base (HYDROGEL) GEL 1 application by Does not apply route every other day. To left ankle    Historical Provider, MD  Carbomer Gel Base (HYDROGEL) GEL Apply 1 application topically every other day. To right ankle    Historical Provider, MD  collagenase (SANTYL) ointment Apply 1 application topically 3 (three) times daily. To right plantar surface    Historical Provider, MD  [employer]S CALCIUM ALGINATE 4"X4" EX Apply 1 application topically. To left heel    Historical Provider, MD  escitalopram (LEXAPRO) 10 MG tablet Take 10 mg by mouth daily.    Historical Provider, MD  Fluticasone Furoate-Vilanterol (BREO ELLIPTA) 100-25 MCG/INH AEPB Inhale 1 each into the lungs daily.    Historical Provider, MD  folic acid (FOLVITE) 1 MG tablet Take 1 mg by mouth daily.    Historical Provider, MD  K Phos Mono-Sod Phos Di & Mono (PHOSPHA 250 NEUTRAL PO) Take 1 capsule by mouth 2 (two) times daily.    Historical Provider, MD  lactulose (CHRONULAC) 10 GM/15ML solution Take 20 g by mouth daily.    Historical Provider, MD  lansoprazole (PREVACID SOLUTAB) 30 MG disintegrating tablet Take 30 mg by mouth daily.    Historical Provider, MD  levothyroxine (SYNTHROID, LEVOTHROID) 150 MCG tablet Take 150 mcg by mouth daily before breakfast.    Historical Provider, MD  Melatonin 3 MG TABS Take 1 tablet by  mouth daily.    Historical Provider, MD  metoCLOPramide (REGLAN) 5 MG/5ML solution Take 5 mg by mouth every 6 (six) hours as needed for nausea.    Historical Provider, MD  metolazone (ZAROXOLYN) 10 MG tablet Take 10 mg by mouth 2 (two) times daily.    Historical Provider, MD  morphine (ROXANOL) 20 MG/ML concentrated solution Place 5 mg into feeding tube every 6 (six) hours. Give 10 mg per G-Tube once daily 30 minutes before dressing changes 03/04/13   Astrid Divineieshia A Durant, NP  Multiple Vitamins-Minerals (DECUBI-VITE PO) Take 1  capsule by mouth daily.    Historical Provider, MD  potassium chloride (KLOR-CON) 20 MEQ packet 20 mEq by PEG Tube route 2 (two) times daily.    Historical Provider, MD  thiamine 100 MG tablet Take 100 mg by mouth daily.    Historical Provider, MD   BP 138/53  Pulse 80  Temp(Src) 97.2 F (36.2 C) (Oral)  Resp 13  Ht 5\' 8"  (1.727 m)  Wt 143 lb (64.864 kg)  BMI 21.75 kg/m2  SpO2 97% Physical Exam  Nursing note and vitals reviewed. Constitutional: He is oriented to person, place, and time. He appears well-developed and well-nourished. No distress.  HENT:  Head: Normocephalic and atraumatic.  Eyes: Conjunctivae and EOM are normal. Pupils are equal, round, and reactive to light.  Neck: No JVD present. No tracheal deviation present.  Cardiovascular: Regular rhythm.  Tachycardia present.  Exam reveals no gallop and no friction rub.   No murmur heard. Pulmonary/Chest: Accessory muscle usage present. He is in respiratory distress. He has wheezes. He has rhonchi. He has rales in the right lower field and the left lower field.  Musculoskeletal: He exhibits no edema.  Neurological: He is alert and oriented to person, place, and time.  Skin: Skin is warm and dry. He is not diaphoretic.     Decubitus ulcer to sacral region with wound vac in place.    decubitus ulcers to bilateral feet  Psychiatric: He has a normal mood and affect. His behavior is normal.    ED Course  Procedures (including critical care time) Labs Review Labs Reviewed  CBC WITH DIFFERENTIAL - Abnormal; Notable for the following:    WBC 16.9 (*)    RBC 3.87 (*)    Hemoglobin 9.8 (*)    HCT 30.5 (*)    MCH 25.3 (*)    RDW 19.4 (*)    Neutro Abs 12.0 (*)    Monocytes Absolute 1.5 (*)    All other components within normal limits  COMPREHENSIVE METABOLIC PANEL - Abnormal; Notable for the following:    Glucose, Bld 121 (*)    BUN 42 (*)    Total Protein 8.7 (*)    Albumin 2.2 (*)    AST 131 (*)    ALT 119 (*)     Total Bilirubin 0.2 (*)    GFR calc non Af Amer 69 (*)    GFR calc Af Amer 80 (*)    All other components within normal limits  URINALYSIS, ROUTINE W REFLEX MICROSCOPIC - Abnormal; Notable for the following:    APPearance CLOUDY (*)    Hgb urine dipstick MODERATE (*)    Protein, ur 30 (*)    Leukocytes, UA LARGE (*)    All other components within normal limits  PRO B NATRIURETIC PEPTIDE - Abnormal; Notable for the following:    Pro B Natriuretic peptide (BNP) 525.4 (*)    All other components within normal limits  URINE MICROSCOPIC-ADD ON - Abnormal; Notable for the following:    Bacteria, UA MANY (*)    Crystals CA OXALATE CRYSTALS (*)    All other components within normal limits  COMPREHENSIVE METABOLIC PANEL - Abnormal; Notable for the following:    Glucose, Bld 102 (*)    BUN 38 (*)    Albumin 1.9 (*)    AST 217 (*)    ALT 207 (*)    GFR calc non Af Amer 67 (*)    GFR calc Af Amer 78 (*)    All other components within normal limits  PREALBUMIN - Abnormal; Notable for the following:    Prealbumin 10.5 (*)    All other components within normal limits  CBC - Abnormal; Notable for the following:    WBC 14.5 (*)    RBC 3.18 (*)    Hemoglobin 7.9 (*)    HCT 25.1 (*)    MCH 24.8 (*)    RDW 19.6 (*)    All other components within normal limits  DIFFERENTIAL - Abnormal; Notable for the following:    Monocytes Relative 13 (*)    Neutro Abs 9.2 (*)    Monocytes Absolute 1.9 (*)    All other components within normal limits  GLUCOSE, CAPILLARY - Abnormal; Notable for the following:    Glucose-Capillary 118 (*)    All other components within normal limits  URINE CULTURE  CULTURE, BLOOD (ROUTINE X 2)  CULTURE, BLOOD (ROUTINE X 2)  MRSA PCR SCREENING  CLOSTRIDIUM DIFFICILE BY PCR  CLOSTRIDIUM DIFFICILE BY PCR  WOUND CULTURE  APTT  VANCOMYCIN, TROUGH  MAGNESIUM  PHOSPHORUS  TRIGLYCERIDES  GLUCOSE, CAPILLARY  I-STAT CG4 LACTIC ACID, ED  I-STAT TROPOININ, ED    Imaging  Review Ct Angio Chest Pe W/cm &/or Wo Cm  04/26/2013   CLINICAL DATA:  Shortness of breath and hemoptysis for 1 day  EXAM: CT ANGIOGRAPHY CHEST WITH CONTRAST  TECHNIQUE: Multidetector CT imaging of the chest was performed using the standard protocol during bolus administration of intravenous contrast. Multiplanar CT image reconstructions and MIPs were obtained to evaluate the vascular anatomy.  CONTRAST:  OMNIPAQUE IOHEXOL 350 MG/ML SOLN  COMPARISON:  DG CHEST 1V PORT dated 04/26/2013  FINDINGS: Contrast within the pulmonary arterial tree is normal in appearance. There are no filling defects to suggest an acute pulmonary embolism. The caliber of the thoracic aorta is normal. There is no evidence of a false lumen. The cardiac chambers are normal in size. There are coronary artery calcifications present. There is no pleural nor pericardial effusion. There are borderline enlarged mediastinal and hilar lymph nodes bilaterally. The largest mediastinal lymph node lies anterior to the left mainstem bronchus and measures 1.3 cm in short axis. A pre carinal node measures approximately the same in short axis. A right hilar lymph node measures 1.7 cm in short axis. The retrosternal soft tissues appear normal. The thoracic esophagus is not abnormally distended.  At lung window settings there are emphysematous changes bilaterally. There are patchy areas of confluent increased density that are partially pleural based in both lower lobes. There is bronchiectasis in both lower lobes. No suspicious masses are demonstrated but small nodules could be obscured by areas of consolidation in the lower lobes.  Within the upper abdomen the observed portions of the liver and spleen and adrenal glands appear normal. There is a tubular structure present likely within the duodenum. It is not completely included in the field of view. It does not  appear to be an esophagogastric tube.  The thoracic vertebral bodies are preserved in height.  The sternum appears intact. The observed portions of the ribs exhibit no acute abnormalities.  Review of the MIP images confirms the above findings.  IMPRESSION: 1. There is no evidence of an acute pulmonary embolism or acute thoracic aortic pathology. 2. There is bronchiectasis within both lower lobes with areas of parenchymal consolidation distal to the areas of bronchiectasis. The findings likely reflect bibasilar pneumonia. 3. There are mildly enlarged mediastinal and hilar lymph nodes which may be reactive but follow-up is needed. 4. There is no evidence of CHF. There are coronary artery calcifications present.   Electronically Signed   By: David  [place]   On: 04/26/2013 17:33   Dg Chest Portable 1 View  04/26/2013   CLINICAL DATA:  Shortness of breath  EXAM: PORTABLE CHEST - 1 VIEW  COMPARISON:  None.  FINDINGS: Cardiac shadow is within normal limits. The lungs are clear bilaterally. A left-sided PICC line is within normal limits. No bony abnormality is noted.  IMPRESSION: No acute abnormality noted.   Electronically Signed   By: Alcide Clever M.D.   On: 04/26/2013 12:34     EKG Interpretation   Date/Time:  Tuesday April 26 2013 11:01:34 EDT Ventricular Rate:  109 PR Interval:  171 QRS Duration: 79 QT Interval:  323 QTC Calculation: 435 R Axis:   55 Text Interpretation:  Sinus tachycardia No previous ECGs available  Confirmed by ZACKOWSKI  MD, SCOTT [phone]) on 04/26/2013 3:47:58 PM      MDM   Final diagnoses:  UTI (lower urinary tract infection)  Sepsis   Tachycardia on exam with rectal temp of 100.6 Worsening leukocytosis at 16.9  Hgb low at 9.8  Transaminitis UA shows UTI with many bacteria and large Leukocytes Lactic acid negative Troponin negative BNP elevated at 525 CXR no acute abnormality  Discussed patient with Dr. Deretha Emory. Plan to admit patient for further management and evaluation of Sepsis.  Empiric Vancomycin and Cefepime started in ED. Due to hx of SOB and  Hemoptysis, will get CT angio Chest to assess for PE.          Rudene Anda, PA-C 04/27/13 1451

## 2013-04-26 NOTE — ED Notes (Signed)
[facility]

## 2013-04-27 ENCOUNTER — Inpatient Hospital Stay (HOSPITAL_COMMUNITY): Payer: Medicare Other

## 2013-04-27 ENCOUNTER — Other Ambulatory Visit: Payer: Self-pay | Admitting: *Deleted

## 2013-04-27 DIAGNOSIS — B1910 Unspecified viral hepatitis B without hepatic coma: Secondary | ICD-10-CM | POA: Diagnosis present

## 2013-04-27 DIAGNOSIS — Z789 Other specified health status: Secondary | ICD-10-CM | POA: Diagnosis present

## 2013-04-27 DIAGNOSIS — J449 Chronic obstructive pulmonary disease, unspecified: Secondary | ICD-10-CM | POA: Diagnosis present

## 2013-04-27 DIAGNOSIS — R1310 Dysphagia, unspecified: Secondary | ICD-10-CM

## 2013-04-27 DIAGNOSIS — R627 Adult failure to thrive: Secondary | ICD-10-CM | POA: Diagnosis present

## 2013-04-27 DIAGNOSIS — I10 Essential (primary) hypertension: Secondary | ICD-10-CM | POA: Diagnosis present

## 2013-04-27 HISTORY — DX: Dysphagia, unspecified: R13.10

## 2013-04-27 HISTORY — DX: Unspecified viral hepatitis B without hepatic coma: B19.10

## 2013-04-27 LAB — COMPREHENSIVE METABOLIC PANEL
ALK PHOS: 79 U/L (ref 39–117)
ALT: 207 U/L — ABNORMAL HIGH (ref 0–53)
AST: 217 U/L — ABNORMAL HIGH (ref 0–37)
Albumin: 1.9 g/dL — ABNORMAL LOW (ref 3.5–5.2)
BILIRUBIN TOTAL: 0.3 mg/dL (ref 0.3–1.2)
BUN: 38 mg/dL — AB (ref 6–23)
CHLORIDE: 100 meq/L (ref 96–112)
CO2: 26 mEq/L (ref 19–32)
CREATININE: 1.11 mg/dL (ref 0.50–1.35)
Calcium: 9.5 mg/dL (ref 8.4–10.5)
GFR calc Af Amer: 78 mL/min — ABNORMAL LOW (ref >90)
GFR, EST NON AFRICAN AMERICAN: 67 mL/min — AB (ref >90)
Glucose, Bld: 102 mg/dL — ABNORMAL HIGH (ref 70–99)
POTASSIUM: 3.9 meq/L (ref 3.7–5.3)
Sodium: 139 mEq/L (ref 137–147)
Total Protein: 7.3 g/dL (ref 6.0–8.3)

## 2013-04-27 LAB — MAGNESIUM: MAGNESIUM: 1.7 mg/dL (ref 1.5–2.5)

## 2013-04-27 LAB — CBC
HCT: 25.1 % — ABNORMAL LOW (ref 39.0–52.0)
Hemoglobin: 7.9 g/dL — ABNORMAL LOW (ref 13.0–17.0)
MCH: 24.8 pg — ABNORMAL LOW (ref 26.0–34.0)
MCHC: 31.5 g/dL (ref 30.0–36.0)
MCV: 78.9 fL (ref 78.0–100.0)
Platelets: 214 10*3/uL (ref 150–400)
RBC: 3.18 MIL/uL — ABNORMAL LOW (ref 4.22–5.81)
RDW: 19.6 % — ABNORMAL HIGH (ref 11.5–15.5)
WBC: 14.5 10*3/uL — ABNORMAL HIGH (ref 4.0–10.5)

## 2013-04-27 LAB — GLUCOSE, CAPILLARY
GLUCOSE-CAPILLARY: 99 mg/dL (ref 70–99)
GLUCOSE-CAPILLARY: 84 mg/dL (ref 70–99)
Glucose-Capillary: 118 mg/dL — ABNORMAL HIGH (ref 70–99)

## 2013-04-27 LAB — DIFFERENTIAL
BASOS PCT: 0 % (ref 0–1)
Basophils Absolute: 0 10*3/uL (ref 0.0–0.1)
EOS ABS: 0.1 10*3/uL (ref 0.0–0.7)
Eosinophils Relative: 1 % (ref 0–5)
LYMPHS ABS: 3.3 10*3/uL (ref 0.7–4.0)
Lymphocytes Relative: 23 % (ref 12–46)
MONO ABS: 1.9 10*3/uL — AB (ref 0.1–1.0)
Monocytes Relative: 13 % — ABNORMAL HIGH (ref 3–12)
NEUTROS PCT: 63 % (ref 43–77)
Neutro Abs: 9.2 10*3/uL — ABNORMAL HIGH (ref 1.7–7.7)

## 2013-04-27 LAB — PHOSPHORUS: Phosphorus: 4.5 mg/dL (ref 2.3–4.6)

## 2013-04-27 LAB — PREALBUMIN: PREALBUMIN: 10.5 mg/dL — AB (ref 17.0–34.0)

## 2013-04-27 LAB — TRIGLYCERIDES: TRIGLYCERIDES: 80 mg/dL (ref <150)

## 2013-04-27 MED ORDER — AMIODARONE HCL 200 MG PO TABS
200.0000 mg | ORAL_TABLET | Freq: Every day | ORAL | Status: DC
  Administered 2013-04-28: 200 mg
  Filled 2013-04-27 (×2): qty 1

## 2013-04-27 MED ORDER — LANSOPRAZOLE 30 MG PO CPDR
30.0000 mg | DELAYED_RELEASE_CAPSULE | Freq: Every day | ORAL | Status: DC
  Administered 2013-04-28: 30 mg
  Filled 2013-04-27 (×2): qty 1

## 2013-04-27 MED ORDER — ONDANSETRON HCL 4 MG/2ML IJ SOLN: 4.0000 mg | Freq: Four times a day (QID) | INTRAMUSCULAR | Status: DC | PRN

## 2013-04-27 MED ORDER — ASPIRIN 81 MG PO CHEW
81.0000 mg | CHEWABLE_TABLET | Freq: Every day | ORAL | Status: DC
  Administered 2013-04-28 – 2013-04-29 (×2): 81 mg
  Filled 2013-04-27 (×2): qty 1

## 2013-04-27 MED ORDER — INSULIN ASPART 100 UNIT/ML ~~LOC~~ SOLN
0.0000 [IU] | Freq: Four times a day (QID) | SUBCUTANEOUS | Status: DC
Start: 2013-04-28 — End: 2013-04-29
  Administered 2013-04-28 – 2013-04-29 (×2): 1 [IU] via SUBCUTANEOUS

## 2013-04-27 MED ORDER — LEVOTHYROXINE SODIUM 150 MCG PO TABS
150.0000 ug | ORAL_TABLET | Freq: Every day | ORAL | Status: DC
  Administered 2013-04-28 – 2013-04-29 (×2): 150 ug
  Filled 2013-04-27 (×3): qty 1

## 2013-04-27 MED ORDER — SODIUM CHLORIDE 0.9 % IV SOLN
INTRAVENOUS | Status: DC
  Administered 2013-04-28: 08:00:00 via INTRAVENOUS
  Administered 2013-04-29: 50 mL/h via INTRAVENOUS
  Administered 2013-04-30: 17:00:00 via INTRAVENOUS

## 2013-04-27 MED ORDER — CLINIMIX E/DEXTROSE (5/20) 5 % IV SOLN
INTRAVENOUS | Status: AC
  Administered 2013-04-27: 18:00:00 via INTRAVENOUS
  Filled 2013-04-27: qty 2000

## 2013-04-27 MED ORDER — ONDANSETRON HCL 4 MG PO TABS
4.0000 mg | ORAL_TABLET | Freq: Four times a day (QID) | ORAL | Status: DC | PRN
Start: 2013-04-27 — End: 2013-04-29

## 2013-04-27 MED ORDER — ESCITALOPRAM OXALATE 10 MG PO TABS
10.0000 mg | ORAL_TABLET | Freq: Every day | ORAL | Status: DC
  Administered 2013-04-28: 10 mg
  Filled 2013-04-27 (×2): qty 1

## 2013-04-27 MED ORDER — PIPERACILLIN-TAZOBACTAM 3.375 G IVPB
3.3750 g | Freq: Three times a day (TID) | INTRAVENOUS | Status: DC
  Administered 2013-04-27 – 2013-04-29 (×6): 3.375 g via INTRAVENOUS
  Filled 2013-04-27 (×8): qty 50

## 2013-04-27 MED ORDER — K PHOS MONO-SOD PHOS DI & MONO 155-852-130 MG PO TABS
250.0000 mg | ORAL_TABLET | Freq: Two times a day (BID) | ORAL | Status: DC
  Administered 2013-04-27 – 2013-04-28 (×3): 250 mg
  Filled 2013-04-27 (×5): qty 1

## 2013-04-27 MED ORDER — LACTULOSE 10 GM/15ML PO SOLN
20.0000 g | Freq: Every day | ORAL | Status: DC
  Administered 2013-04-28: 20 g
  Filled 2013-04-27 (×2): qty 30

## 2013-04-27 MED ORDER — POTASSIUM CHLORIDE 20 MEQ/15ML (10%) PO LIQD
20.0000 meq | Freq: Two times a day (BID) | ORAL | Status: DC
  Administered 2013-04-27: 20 meq
  Filled 2013-04-27: qty 15

## 2013-04-27 MED ORDER — FAT EMULSION 20 % IV EMUL
250.0000 mL | INTRAVENOUS | Status: AC
  Administered 2013-04-27: 250 mL via INTRAVENOUS
  Filled 2013-04-27: qty 250

## 2013-04-27 MED ORDER — MORPHINE SULFATE (CONCENTRATE) 20 MG/ML PO SOLN: ORAL | Status: DC

## 2013-04-27 MED ORDER — SODIUM CHLORIDE 0.9 % IJ SOLN: 10.0000 mL | INTRAMUSCULAR | Status: DC | PRN

## 2013-04-27 NOTE — Consult Note (Signed)
WOC wound consult note Reason for Consult: evaluation of LE wounds and sacrum.  Limited information from patient, he does agree that he has wounds on his feet and sacrum.  He does not know how long but agrees they have been there for a long time.  He is from SNF, no family at the bedside. He presents with NPWT VAC dressing to the sacrum.  Bilateral LE wounds, shinny pretibial region with no hair but he has palpable pulses.  He has scarring over bilateral pretibial region, no edema.  Wound type: 1. Right foot: lateral Unstageable Pressure ulcer 2. Right foot: great toe sDTI (suspected deep tissue injury) 3. Right plantar foot: debrided away large roof of preexisting bulla that has drained, under this is an open area, unclear but due to patient height it appears his plantar foot surfaces bilaterally have been against a bed rail just based on the shape and location of the wounds.  4. Left plantar foot: Unstageable Pressure ulcer 5. Left heel: Ustageable Pressure ulcer 6. Left malleolus: Unstageable Pressure ulcer 7. Sacrum: Stage IV Pressure Ulcer  Pressure Ulcer POA: Yes x 7 Measurement: 1. Right lateral foot: 4cm x 3cm x 0.5cm 2. Right great toe: 0.5cm x 1.0cm x 0 3. Right plantar surface: 4cm x 3cm x 0.2cm 4. Left plantar surface: 5c x 4.5cm x 0.5cm  5. Left heel: 2cm x 2cm x 0 6. Left malleolus: 0.5cm x 0.5cm x 0.2cm  Wound bed: 1: 100% yellow, loose, avascular tissue 2. Dark, maroon, intact tissue 3. Pink, moist (post CSWD) 4. 100% necrotic, yellow/black/brown, avascular tissue 5. 100% black, stable eschar 6. 100% eschar, stable 7. 100% granulation tissue, clean, pink, moist  Drainage (amount, consistency, odor) drainage noted from the left plantar and right lateral foot wounds, due to the necrotic tissue, yellow tan drainage, no odor Oozing from the sacral wound with the NPWT VAC dressing, no unit hooked to patient at the time of his arrival to the hospital for me to evaluate this  drainage. Periwound:intact at all sites, some superficial skin peeling at on the left and right feet. Dressing procedure/placement/frequency: Continue NPWT VAC dressings to the sacrum M/W/F.  Add Santyl to the necrotic wounds for enzymatic debridement, CC MD at bedside-requested consult for ortho due to the extent of the necrotic tissue and the palpable bone in both the foot wounds.  Would recommend xray to rule out osteomyelitis and ABI's for profusion, CCS to order. Pt on air mattress currently for pressure redistribution.  Offload heels and other foot wounds with Prevelon boots. Maintain stable heel eschar on the left foot if possible with offloading.  Moist gauze to the left plantar surface for moist wound healing. Monitor left malleolus for changes, it is current dry and stable.   Conservative sharp wound debridement (CSWD performed at the bedside): cleansed the right plantar surface with betadine, then removed the non viable tissue without difficulty.  Pt tolerated well.

## 2013-04-27 NOTE — Progress Notes (Signed)
SLP Cancellation Note  Patient Details Name: Craig [name] MRN: [id number] DOB: 1945-02-25   Cancelled treatment:        Orders received for BSE.  According to RN, pt has J-tube and TPN.  Calls placed to Craig [name] to acquire information about pt's swallowing history, to determine most appropriate course of action for this pt. Pt is currently NPO. No SLP intervention has been completed at this facility to date.  Will continue efforts. RN aware.  Craig [name], [facility] Hospital[facility]P, CCC-SLP [phone]  Craig [name] 04/27/2013, 2:40 PM

## 2013-04-27 NOTE — Progress Notes (Signed)
Foley removed per order, 400 ccs in drainage bag. New one inserted. See Doc Flowsheets.

## 2013-04-27 NOTE — Progress Notes (Addendum)
PARENTERAL NUTRITION CONSULT NOTE - INITIAL  Pharmacy Consult for TPN Indication: Chronic TPN (severe PCM, poor wound healing s/p diverting colostomy and gastrojejunostomy per SNF note)  No Known Allergies  Patient Measurements: Height: 5\' 8"  (172.7 cm) Weight: 143 lb (64.864 kg) IBW/kg (Calculated) : 68.4  Vital Signs: Temp: 98.2 F (36.8 C) (04/15 0736) Temp src: Oral (04/15 0736) BP: 121/43 mmHg (04/15 0736) Pulse Rate: 84 (04/15 0736) Intake/Output from previous day: 04/14 0701 - 04/15 0700 In: 2622.5 [I.V.:2172.5; IV Piggyback:300] Out: 1425 [Urine:1425] Intake/Output from this shift: Total I/O In: 170 [I.V.:170] Out: 300 [Urine:300]  Labs:  Recent Labs  04/26/13 1135 04/27/13 0500  WBC 16.9* 14.5*  HGB 9.8* 7.9*  HCT 30.5* 25.1*  PLT 284 214  APTT 35  --      Recent Labs  04/26/13 1135 04/27/13 0500  NA 140 139  K 4.9 3.9  CL 100 100  CO2 27 26  GLUCOSE 121* 102*  BUN 42* 38*  CREATININE 1.09 1.11  CALCIUM 10.3 9.5  MG  --  1.7  PHOS  --  4.5  PROT 8.7* 7.3  ALBUMIN 2.2* 1.9*  AST 131* 217*  ALT 119* 207*  ALKPHOS 90 79  BILITOT 0.2* 0.3  TRIG  --  80   Estimated Creatinine Clearance: 60.1 ml/min (by C-G formula based on Cr of 1.11).    Recent Labs  04/26/13 2334  GLUCAP 118*    Medical History: Past Medical History  Diagnosis Date  . COPD (chronic obstructive pulmonary disease)   . Hypertension   . Myocardial infarction   . A-fib   . GERD (gastroesophageal reflux disease)     Medications:  Prescriptions prior to admission  Medication Sig Dispense Refill  . amiodarone (PACERONE) 200 MG tablet Take 200 mg by mouth daily.      Marland Kitchen. aspirin 81 MG tablet Take 81 mg by mouth daily.      . Carbomer Gel Base (HYDROGEL) GEL 1 application by Does not apply route every other day. To left ankle      . Carbomer Gel Base (HYDROGEL) GEL Apply 1 application topically every other day. To right ankle      . collagenase (SANTYL) ointment Apply  1 application topically 3 (three) times daily. To right plantar surface      . [employer] CALCIUM ALGINATE 4"X4" EX Apply 1 application topically. To left heel      . escitalopram (LEXAPRO) 10 MG tablet Take 10 mg by mouth daily.      . Fluticasone Furoate-Vilanterol (BREO ELLIPTA) 100-25 MCG/INH AEPB Inhale 1 puff into the lungs daily.       . folic acid (FOLVITE) 1 MG tablet Take 1 mg by mouth daily.      . K Phos Mono-Sod Phos Di & Mono (PHOSPHA 250 NEUTRAL PO) Take 1 capsule by mouth 2 (two) times daily.      Marland Kitchen. lactulose (CHRONULAC) 10 GM/15ML solution Take 20 g by mouth daily.      . lansoprazole (PREVACID SOLUTAB) 30 MG disintegrating tablet Take 30 mg by mouth daily.      Marland Kitchen. levothyroxine (SYNTHROID, LEVOTHROID) 150 MCG tablet Take 150 mcg by mouth daily before breakfast.      . Melatonin 3 MG TABS Take 1 tablet by mouth daily.      . metoCLOPramide (REGLAN) 5 MG/5ML solution Take 5 mg by mouth every 6 (six) hours as needed for nausea.      . metolazone (ZAROXOLYN) 10 MG  tablet Take 10 mg by mouth 2 (two) times daily.      . Multiple Vitamins-Minerals (DECUBI-VITE PO) Take 1 capsule by mouth daily.      . potassium chloride (KLOR-CON) 20 MEQ packet 20 mEq by PEG Tube route 2 (two) times daily.      Marland Kitchen. thiamine 100 MG tablet Take 100 mg by mouth daily.      . TPN ADULT Inject 96 mL/hr into the vein continuous. Pt receiving 2300 ml TPN over 24 hours - obtains from [facility]        Insulin Requirements in the past 24 hours:  None  Current Nutrition:  D10 at 95 ml/hr provides 775 kcal daily TPN PTA 96 ml/hr over 24 hours provides 2308 kcal, 112 gm protein  Nutritional Goals:  Pending RD recommendations  Assessment: Admit: 68 y.o. male presents from [facility] coughing up blood-tinged sputum. Noted pt with h/o recurrent PNA. 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 Pt has been on TPN since at least 03/05/13 per SNF records. Unable to tell if TF ever tried via GJ tube.  Endo: Pt with hypothyroidism - on levothyroxine. TSH pending. No hx of DM and no insulin in chronic TPN.  Lytes: WNL at baseline. Phos 4.5, K 3.9 - pt continued on po Kphos tabs BID and KCl 20mEq BID per tube as PTA.  Renal: MIVF: D10 at 95 ml/hr and NS at 75 ml/hr.  Pulm: RA. CT chest neg for PE.  Cards: VSS. On amio, ASA 81.  Hepatobil: AST/ALT elevated at baseline. TG 80.  Heme: Hgb 7.9 at baseline, plt ok.  Neuro: h/o depression. On escitalopram.  ID: Cefepime/Vanc for sepsis - suspected HCAP. Pt also with positive UA. Cdiff neg.  Best Practices: Lovenox, lansoprazole po  TPN Access: PICC (from SNF)  TPN day#: Chronic  Plan:  1. Will start Clinimix E 5/20 at 83 ml/hr and lipids 20% at 10 ml/hr. This will provide 2233 kcal and 100 gm protein (close approximation of home formula with hospital premixed TPN).  2. D/C D10 when TPN hangs today at 1800. 3. Empiric CBGs/sensitive SSI q6h once TPN starts - if CBGs remain well controlled, will d/c. 4. Will add zinc and vitamin C to TPN to aid in wound healing. Will d/c per tube folic acid and thiamine as will be added in TPN as part of MVI. 5. MVI daily in TPN and trace elements M/W/F due to national shortage 6. F/u RD recommendations 7. F/u TPN labs 8. Noted pt with GJ tube - consider GI or CCS consult to see if trial of tube feed is option.   Christoper Fabianaron Colvin Blatt, PharmD, BCPS Clinical pharmacist, pager [phone] 04/27/2013,9:10 AM

## 2013-04-27 NOTE — Progress Notes (Signed)
[name]  Nitesh Pitstick ZOX:[id number] DOB: [birth year] DOA: 04/26/2013 PCP: No primary provider on file.  Time spent : 35 minutes  Brief narrative: 68 year old male patient resident of a skilled nursing facility who has never been hospitalized at [facility]. Was sent from the skilled nursing facility because of fever and hypoxia x 24 hours. Patient has a significant past medical history of failure to thrive, chronic stage IV sacral decubitus as well as bilateral heel and ankle ulcers. He required a diverting colostomy. He has had a PEG tube placed in the past and has a PICC line in place for chronic parenteral nutrition. He was recently admitted to the skilled nursing facility after spending 2 months at [facility] after being treated at [facility] for complications related to the decubitus and subsequent formation of a diverting colostomy.  He apparently presented to the nursing facility from the LTAC with plans to continue antibiotics for 2 weeks. Patient recently completed a vancomycin course 24 hours prior to development of these symptoms. In addition to the fever and hypoxemia he reportedly had developed hemoptysis. In the emergency department his room air saturations were 81%. He had a leukocytosis of 15,000 and had no output from his colostomy.  The electronic medical records from [facility] as well as [facility] have been reviewed in detail back to 2010. He has a significant history of poor social situation including homelessness that required living in shelters as well as being incarcerated in the county jail. 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 An October visit to the hospital documented poor living conditions and that the patient had been bed bound for several months but it was unclear if he had a decubitus at that time. He was discharged to a skilled nursing  facility.  He returned to the hospital in December of 2014 for influenza and healthcare acquired pneumonia and at that time a documented stage IV decubitus. He was later found to have Proteus pneumonia as well as E. Faecali bacteremia. CT scan done during that admission revealed sacral and coccygeal osteomyelitis but a three-phase bone scan done on 01/12/2013 demonstrated no evidence of osteomyelitis. Patient also has a history of atrial fibrillation but given his poor social situation he apparently was deemed an inappropriate candidate for long-term anticoagulation.  He underwent a diverting colostomy and apparent insertion of a gastric feeding tube in January 2015 and eventually was sent to [facility] and as noted was subsequently discharged to the skilled nursing facility.  It is unclear from the notes why TNA was being utilized in leu of the G tube for tube feeding.  Per the encounter in December 2014 at [facility] a sister is his legal guardian. A palliative evaluation was undertaken at that time and although she did not wish to change the patient's CODE STATUS from full resuscitation but she did wish to keep the patient comfortable.  HPI/Subjective: Patient appears very weak with limited ability to respond verbally and therefore history was difficult to obtain. No apparent complaints regarding chest pain, shortness of breath or any areas of significant pain.  Assessment/Plan:  Sepsis:   A) UTI (lower urinary tract infection)   B) Severe Pressure ulcers of bilateral feet w/ extensive necrotic tissue  -Above problems appear to be etiology for sepsis -FU on urine cx -XR feet: Right foot without evidence of osteomyelitis but left foot  x-ray has possible area of osteomyelitis on fifth metatarsal  -Check ABIs -may need Ortho eval to consider debridement v/s amputations -cont empiric anbx- dc Cefepime in favor of Zosyn- cont Vanco -wound care per WOC RN -may have a degree  of infectious related encpehalopathy -cont morphine for pain  COPD  -compensated without wheezing or need for oxygen  Transaminitis /Hepatitis B -LFTs have increased slightly since admission and both AST and ALT greater than 200 with a normal total bilirubin -This is likely a combination of ongoing use of parenteral nutrition as well as possible issues related to recent hypoxemia and possible hypotension prior to admission i.e. shock liver -Follow labs -Last CT abdomen and pelvis at [facility] in February 2015 demonstrated normal liver and biliary tree as well as normal pancreas and spleen and adrenal glands -Patient on Chronulac for presumed constipation prevention since no evidence of cirrhosis or hepatic encephalopathy in the past  Unspecified hypothyroidism -cont Synthroid  Sacral decubitus ulcer, stage IV -clean and granulating well -cont wound VAC and other care as outlined by WOC RN  A-fib -currently maintaining NSR on Amiodarone  Dysphagia -Records from December 2014 admission to [facility] reveal that the patient was suspected of having dysphagia and an outpatient speech evaluation was recommended to be completed at the long-term care facility unfortunately I do not have documentation regarding this -Patient presents to this facility with a PEG tube but unclear if functioning or if was actually being used at the nursing facility other than for medications -Check abdominal x-ray to see if appropriate placement -Speech evaluation to determine if patient can attempt oral diet noting medication orders at nursing facilities state by mouth and patient may have been taking his medications by mouth prior to admission  Diverticulosis -No apparent abdominal pain at this time -Continue Chronulac to prevent constipation  Depression -Seems to be a long-standing issue -Continue Lexapro  GERD -Continue Prevacid  FTT in adult / Protein-calorie malnutrition, severe / On total parenteral  nutrition (TPN) -As above -Prealbumin 10.5 -Preferred route of nutrition would be enteral especially if need to increase protein level    DVT prophylaxis: Lovenox Code Status: Full Family Communication: No family at bedside but per previous documentation at [facility] states patient's sister is his legal guardian Disposition Plan: Stepdown  Consultants: None  Procedures: Ankle brachial index - pending  Antibiotics: Cefepime 4/14 >>> 4/15 Zosyn 4/15 >> Vancomycin 4/14 >>>  Objective: Blood pressure 138/53, pulse 80, temperature 97.2 F (36.2 C), temperature source Oral, resp. rate 13, height 5\' 8"  (1.727 m), weight 143 lb (64.864 kg), SpO2 97.00%.  Intake/Output Summary (Last 24 hours) at 04/27/13 1519 Last data filed at 04/27/13 1500  Gross per 24 hour  Intake 4332.5 ml  Output   2075 ml  Net 2257.5 ml   Exam: General: No acute respiratory distress - appears chronically ill/cachectic  ENT; oral mucous membranes dry, poor dentition and thick retained secretions Lungs: Clear to auscultation bilaterally without wheezes or crackles, RA Cardiovascular: Regular rate and rhythm without murmur gallop or rub normal S1 and S2, no peripheral edema or JVD Abdomen: Nontender, nondistended, soft, bowel sounds positive, no rebound, no ascites, no appreciable mass-colostomy with flatus and stool in bag, gastrostomy tube clamped and is dark in color with some oozing around the insertion site and redness of skin Musculoskeletal: No significant cyanosis, clubbing of bilateral lower extremities Neurological: Awake and minimally verbally responsive, generalized weakness without any obvious focal neurological deficits, speech difficult to understand  Scheduled Meds:  Scheduled Meds: . amiodarone  200 mg Oral Daily  . aspirin EC  81 mg Oral Daily  . collagenase  1 application Topical TID  . enoxaparin (LOVENOX) injection  40 mg Subcutaneous Q24H  . escitalopram  10 mg Oral Daily  .  [START ON 04/28/2013] insulin aspart  0-9 Units Subcutaneous 4 times per day  . lactulose  20 g Oral Daily  . lansoprazole  30 mg Oral Daily  . levothyroxine  150 mcg Oral QAC breakfast  . mometasone-formoterol  2 puff Inhalation BID  . morphine CONCENTRATE  5 mg Per Tube Q6H  . phosphorus  250 mg Oral BID  . piperacillin-tazobactam (ZOSYN)  IV  3.375 g Intravenous Q8H  . potassium chloride  20 mEq Per Tube BID  . sodium chloride  3 mL Intravenous Q12H  . vancomycin  1,000 mg Intravenous Q24H   Continuous Infusions: . sodium chloride 125 mL/hr at 04/27/13 1336  . dextrose    . Marland Kitchen.TPN (CLINIMIX-E) Adult     And  . fat emulsion      Data Reviewed: Basic Metabolic Panel:  Recent Labs Lab 04/26/13 1135 04/27/13 0500  NA 140 139  K 4.9 3.9  CL 100 100  CO2 27 26  GLUCOSE 121* 102*  BUN 42* 38*  CREATININE 1.09 1.11  CALCIUM 10.3 9.5  MG  --  1.7  PHOS  --  4.5   Liver Function Tests:  Recent Labs Lab 04/26/13 1135 04/27/13 0500  AST 131* 217*  ALT 119* 207*  ALKPHOS 90 79  BILITOT 0.2* 0.3  PROT 8.7* 7.3  ALBUMIN 2.2* 1.9*   CBC:  Recent Labs Lab 04/26/13 1135 04/27/13 0500  WBC 16.9* 14.5*  NEUTROABS 12.0* 9.2*  HGB 9.8* 7.9*  HCT 30.5* 25.1*  MCV 78.8 78.9  PLT 284 214    CBG:  Recent Labs Lab 04/26/13 2334 04/27/13 1139  GLUCAP 118* 99    Recent Results (from the past 240 hour(s))  URINE CULTURE     Status: None   Collection Time    04/26/13 11:20 AM      Result Value Ref Range Status   Specimen Description URINE, CATHETERIZED   Final   Special Requests Normal   Final   Culture  Setup Time     Final   Value: 04/26/2013 12:09     Performed at [facility]   Colony Count PENDING   Incomplete   Culture     Final   Value: Culture reincubated for better growth     [facility]formed at [facility]   Report Status PENDING   Incomplete  CULTURE, BLOOD (ROUTINE X 2)     Status: None   Collection Time    04/26/13 11:35 AM       Result Value Ref Range Status   Specimen Description BLOOD LEFT HAND   Final   Special Requests BOTTLES DRAWN AEROBIC AND ANAEROBIC 5CC   Final   Culture  Setup Time     Final   Value: 04/26/2013 16:17     Performed at [facility]   Culture     Final   Value:        BLOOD CULTURE RECEIVED NO GROWTH TO DATE CULTURE WILL BE HELD FOR 5 DAYS BEFORE ISSUING A FINAL NEGATIVE REPORT     Performed at [facility]   Report Status PENDING   Incomplete  CULTURE, BLOOD (ROUTINE X 2)     Status: None  Collection Time    04/26/13 11:45 AM      Result Value Ref Range Status   Specimen Description BLOOD LEFT ANTECUBITAL   Final   Special Requests BOTTLES DRAWN AEROBIC AND ANAEROBIC 5CC   Final   Culture  Setup Time     Final   Value: 04/26/2013 16:17     Performed at [facility]   Culture     Final   Value:        BLOOD CULTURE RECEIVED NO GROWTH TO DATE CULTURE WILL BE HELD FOR 5 DAYS BEFORE ISSUING A FINAL NEGATIVE REPORT     Performed at [facility]   Report Status PENDING   Incomplete  CLOSTRIDIUM DIFFICILE BY PCR     Status: None   Collection Time    04/26/13  5:46 PM      Result Value Ref Range Status   C difficile by pcr NEGATIVE  NEGATIVE Final  MRSA PCR SCREENING     Status: None   Collection Time    04/26/13  6:28 PM      Result Value Ref Range Status   MRSA by PCR NEGATIVE  NEGATIVE Final   Comment:            The GeneXpert MRSA Assay (FDA     approved for NASAL specimens     only), is one component of a     comprehensive MRSA colonization     surveillance program. It is not     intended to diagnose MRSA     infection nor to guide or     monitor treatment for     MRSA infections.     Studies:  Recent x-ray studies have been reviewed in detail by the Attending Physician       Junious SilkAllison Ellis, ANP [facility] Office  [phone] Pager [phone]  **If unable to reach the above provider after paging please contact the Flow  Manager @ [phone]  On-Call/Text Page:      [email or web address] [email or web address]      password [email or web address]  If 7PM-7AM, please contact night-coverage [email or web address] Password [email or web address] 04/27/2013, 3:19 PM   LOS: 1 day   I have personally examined this patient and reviewed the entire database. I have reviewed the above note, made any necessary editorial changes, and agree with its content.  Lonia BloodJeffrey T. Nashali Ditmer, MD [facility]

## 2013-04-27 NOTE — Telephone Encounter (Signed)
[facility] [place] 

## 2013-04-27 NOTE — Progress Notes (Signed)
ANTIBIOTIC CONSULT NOTE - FOLLOW UP  Pharmacy Consult for Zosyn and Vancomycin Indication: sepsis,possible osteomyelitis of toe, and UTI coverage  No Known Allergies  Patient Measurements: Height: 5\' 8"  (172.7 cm) Weight: 143 lb (64.864 kg) IBW/kg (Calculated) : 68.4  Vital Signs: Temp: 97.4 F (36.3 C) (04/15 1538) Temp src: Oral (04/15 1538) BP: 121/45 mmHg (04/15 1538) Pulse Rate: 81 (04/15 1538) Intake/Output from previous day: 04/14 0701 - 04/15 0700 In: 2622.5 [I.V.:2172.5; IV Piggyback:300] Out: 1425 [Urine:1425] Intake/Output from this shift: Total I/O In: 1930 [I.V.:1880; IV Piggyback:50] Out: 1125 [Urine:1000; Stool:125]  Labs:  Recent Labs  04/26/13 1135 04/27/13 0500  WBC 16.9* 14.5*  HGB 9.8* 7.9*  PLT 284 214  CREATININE 1.09 1.11   Estimated Creatinine Clearance: 60.1 ml/min (by C-G formula based on Cr of 1.11).  Recent Labs  04/26/13 1937  VANCOTROUGH 19.0    Assessment:  Vancomycin resumed last night with 1 gram IV q24hrs, when trough level was in goal range.  Cefepime changed to Zosyn today for possible osteomyelitis on left 5th metatarsal. Stage IV sacral decubitus ulcer noted clean and granulating; wound VAC in place.  Tmax 99.5, WBC trending down some. Urine and blood cultures pending.  Goal of Therapy:  Vancomycin trough level 15-20 mcg/ml appropriate Zosyn dose for renal function and infection  Plan:   Zosyn 3.375 grams IV q8hrs (each infused over 4 hours).  Continue Vancomycin 1 gram IV q24hrs.  Follow renal function, culture data and clinical status.  Dennie Fettersheresa Donovan Hendryx Ricke, [place] Pager: [phone] 04/27/2013,4:42 PM

## 2013-04-27 NOTE — Progress Notes (Addendum)
INITIAL NUTRITION ASSESSMENT  DOCUMENTATION CODES Per approved criteria  -Severe malnutrition in the context of chronic illness   INTERVENTION:  TPN per pharmacy  PO diet advancement vs TF initiation via G-J tube? RD to follow for nutrition care plan  NUTRITION DIAGNOSIS: Increased nutrient needs related to malnutrition, multiple wounds as evidenced by estimated nutrition needs  Goal: Pt to meet >/= 90% of their estimated nutrition needs   Monitor:  TPN prescription, PO diet advancement, weight, labs, I/O's  Reason for Assessment: Consult, Malnutrition Screening Tool Report  68 y.o. male  Admitting Dx: SIRS (systemic inflammatory response syndrome)  ASSESSMENT: Patient with PMH of diverticulitis status post colostomy, A. fib not on anticoagulation, hypothyroidism, GERD, stage IV sacral decubitus ulcer and bilateral heel and ankle ulcers, generalized weakness who is a resident of SNF was sent from there with fever and hypoxia.  RD unable to obtain nutrition hx from patient; [facility] who reported pt came to SNF in February 2015 on TPN; also reported SNF staff was told not to utilize pt's G-J tube for feeding at that time; RD spoke with Junious SilkAllison Ellis, NP regarding nutrition care plan (SLP consult, possible EN initiation); CWOCN note reviewed -- pt with multiple wounds.  Patient is receiving TPN with Clinimix E 5/20 @ 83 ml/hr and lipids @ 10 ml/hr. Provides 2233 kcal and 100 grams protein per day. Meets 100% minimum estimated energy needs and 100% minimum estimated protein needs.  Nutrition Focused Physical Exam:  Subcutaneous Fat:  Orbital Region: N/A Upper Arm Region: severe depletion Thoracic and Lumbar Region: N/A  Muscle:  Temple Region: severe depletion Clavicle Bone Region: severe depletion Clavicle and Acromion Bone Region: severe depletion Scapular Bone Region: N/A Dorsal Hand: N/A Patellar Region:  N/A Anterior Thigh Region: N/A Posterior Calf Region: N/A  Edema: none  Patient meets criteria for severe malnutrition in the context of chronic illness as evidenced by severe muscle loss and severe subcutaneous fat loss.  Height: Ht Readings from Last 1 Encounters:  04/26/13 5\' 8"  (1.727 m)    Weight: Wt Readings from Last 1 Encounters:  04/26/13 143 lb (64.864 kg)    Ideal Body Weight: 154 lb  % Ideal Body Weight: 92%  Wt Readings from Last 10 Encounters:  04/26/13 143 lb (64.864 kg)  04/26/13 144 lb (65.318 kg)  03/28/13 138 lb (62.596 kg)  03/18/13 131 lb (59.421 kg)  03/03/13 130 lb (58.968 kg)    Usual Body Weight: 138 lb  % Usual Body Weight: 104%  BMI:  Body mass index is 21.75 kg/(m^2).  Estimated Nutritional Needs: Kcal: 1800-2000 Protein: 100-110 gm Fluid: 1.8-2.0 L  Skin: Stage IV sacral pressure ulcer, unstageable pressure ulcers to: R foot, L heel, L plantar foot and L malleolus  Diet Order: NPO  EDUCATION NEEDS: -No education needs identified at this time   Intake/Output Summary (Last 24 hours) at 04/27/13 1357 Last data filed at 04/27/13 1323  Gross per 24 hour  Intake 3892.5 ml  Output   2025 ml  Net 1867.5 ml    Labs:   Recent Labs Lab 04/26/13 1135 04/27/13 0500  NA 140 139  K 4.9 3.9  CL 100 100  CO2 27 26  BUN 42* 38*  CREATININE 1.09 1.11  CALCIUM 10.3 9.5  MG  --  1.7  PHOS  --  4.5  GLUCOSE 121* 102*    CBG (last 3)   Recent Labs  04/26/13 2334 04/27/13  1139  GLUCAP 118* 99    Scheduled Meds: . amiodarone  200 mg Oral Daily  . aspirin EC  81 mg Oral Daily  . collagenase  1 application Topical TID  . enoxaparin (LOVENOX) injection  40 mg Subcutaneous Q24H  . escitalopram  10 mg Oral Daily  . [START ON 04/28/2013] insulin aspart  0-9 Units Subcutaneous 4 times per day  . lactulose  20 g Oral Daily  . lansoprazole  30 mg Oral Daily  . levothyroxine  150 mcg Oral QAC breakfast  . mometasone-formoterol  2  puff Inhalation BID  . morphine CONCENTRATE  5 mg Per Tube Q6H  . phosphorus  250 mg Oral BID  . piperacillin-tazobactam (ZOSYN)  IV  3.375 g Intravenous Q8H  . potassium chloride  20 mEq Per Tube BID  . sodium chloride  3 mL Intravenous Q12H  . vancomycin  1,000 mg Intravenous Q24H    Continuous Infusions: . sodium chloride 125 mL/hr at 04/27/13 1336  . dextrose    . Marland Kitchen.TPN (CLINIMIX-E) Adult     And  . fat emulsion      Past Medical History  Diagnosis Date  . COPD (chronic obstructive pulmonary disease)   . Hypertension   . Myocardial infarction   . A-fib   . GERD (gastroesophageal reflux disease)     History reviewed. No pertinent past surgical history.  Maureen ChattersKatie Harlie Ragle, RD, LDN Pager #: [phone] After-Hours Pager #: [phone]

## 2013-04-28 DIAGNOSIS — I4891 Unspecified atrial fibrillation: Secondary | ICD-10-CM

## 2013-04-28 DIAGNOSIS — I10 Essential (primary) hypertension: Secondary | ICD-10-CM

## 2013-04-28 DIAGNOSIS — L8994 Pressure ulcer of unspecified site, stage 4: Secondary | ICD-10-CM

## 2013-04-28 DIAGNOSIS — R627 Adult failure to thrive: Secondary | ICD-10-CM

## 2013-04-28 DIAGNOSIS — R1310 Dysphagia, unspecified: Secondary | ICD-10-CM

## 2013-04-28 DIAGNOSIS — L89109 Pressure ulcer of unspecified part of back, unspecified stage: Secondary | ICD-10-CM

## 2013-04-28 DIAGNOSIS — E039 Hypothyroidism, unspecified: Secondary | ICD-10-CM

## 2013-04-28 DIAGNOSIS — B1910 Unspecified viral hepatitis B without hepatic coma: Secondary | ICD-10-CM

## 2013-04-28 DIAGNOSIS — R7881 Bacteremia: Secondary | ICD-10-CM | POA: Diagnosis present

## 2013-04-28 DIAGNOSIS — J449 Chronic obstructive pulmonary disease, unspecified: Secondary | ICD-10-CM

## 2013-04-28 LAB — CBC
HCT: 22.7 % — ABNORMAL LOW (ref 39.0–52.0)
Hemoglobin: 7.2 g/dL — ABNORMAL LOW (ref 13.0–17.0)
MCH: 24.5 pg — AB (ref 26.0–34.0)
MCHC: 31.7 g/dL (ref 30.0–36.0)
MCV: 77.2 fL — AB (ref 78.0–100.0)
PLATELETS: 219 10*3/uL (ref 150–400)
RBC: 2.94 MIL/uL — ABNORMAL LOW (ref 4.22–5.81)
RDW: 19.4 % — AB (ref 11.5–15.5)
WBC: 11.5 10*3/uL — ABNORMAL HIGH (ref 4.0–10.5)

## 2013-04-28 LAB — COMPREHENSIVE METABOLIC PANEL
ALK PHOS: 76 U/L (ref 39–117)
ALT: 186 U/L — AB (ref 0–53)
AST: 160 U/L — AB (ref 0–37)
Albumin: 1.7 g/dL — ABNORMAL LOW (ref 3.5–5.2)
BUN: 28 mg/dL — ABNORMAL HIGH (ref 6–23)
CO2: 27 meq/L (ref 19–32)
Calcium: 9.1 mg/dL (ref 8.4–10.5)
Chloride: 97 mEq/L (ref 96–112)
Creatinine, Ser: 1.15 mg/dL (ref 0.50–1.35)
GFR calc non Af Amer: 65 mL/min — ABNORMAL LOW (ref >90)
GFR, EST AFRICAN AMERICAN: 75 mL/min — AB (ref >90)
GLUCOSE: 113 mg/dL — AB (ref 70–99)
Potassium: 3.9 mEq/L (ref 3.7–5.3)
SODIUM: 136 meq/L — AB (ref 137–147)
TOTAL PROTEIN: 6.6 g/dL (ref 6.0–8.3)
Total Bilirubin: <0.2 mg/dL — ABNORMAL LOW (ref 0.3–1.2)

## 2013-04-28 LAB — GLUCOSE, CAPILLARY
GLUCOSE-CAPILLARY: 121 mg/dL — AB (ref 70–99)
GLUCOSE-CAPILLARY: 119 mg/dL — AB (ref 70–99)
Glucose-Capillary: 115 mg/dL — ABNORMAL HIGH (ref 70–99)
Glucose-Capillary: 117 mg/dL — ABNORMAL HIGH (ref 70–99)

## 2013-04-28 LAB — MAGNESIUM: MAGNESIUM: 1.6 mg/dL (ref 1.5–2.5)

## 2013-04-28 LAB — PHOSPHORUS: PHOSPHORUS: 4.5 mg/dL (ref 2.3–4.6)

## 2013-04-28 MED ORDER — FAT EMULSION 20 % IV EMUL
250.0000 mL | INTRAVENOUS | Status: DC
  Administered 2013-04-28: 250 mL via INTRAVENOUS
  Filled 2013-04-28: qty 250

## 2013-04-28 MED ORDER — ZINC TRACE METAL 1 MG/ML IV SOLN
INTRAVENOUS | Status: DC
  Administered 2013-04-28: 18:00:00 via INTRAVENOUS
  Filled 2013-04-28: qty 2000

## 2013-04-28 MED ORDER — MAGNESIUM SULFATE 40 MG/ML IJ SOLN
2.0000 g | Freq: Once | INTRAMUSCULAR | Status: AC
  Administered 2013-04-28: 2 g via INTRAVENOUS
  Filled 2013-04-28: qty 50

## 2013-04-28 NOTE — Progress Notes (Signed)
PARENTERAL NUTRITION CONSULT NOTE - FOLLOW UP  Pharmacy Consult:  TPN Indication: Chronic TPN (severe PCM, poor wound healing s/p diverting colostomy and gastrojejunostomy per SNF note)  No Known Allergies  Patient Measurements: Height: 5\' 8"  (172.7 cm) Weight: 143 lb (64.864 kg) IBW/kg (Calculated) : 68.4  Vital Signs: Temp: 98.3 F (36.8 C) (04/16 0310) Temp src: Oral (04/16 0310) BP: 118/43 mmHg (04/16 0310) Pulse Rate: 85 (04/16 0310) Intake/Output from previous day: 04/15 0701 - 04/16 0700 In: 5206.8 [I.V.:3645; IV Piggyback:337.5; TPN:1224.3] Out: 3200 [Urine:2925; Stool:275]  Labs:  Recent Labs  04/26/13 1135 04/27/13 0500 04/28/13 0430  WBC 16.9* 14.5* 11.5*  HGB 9.8* 7.9* 7.2*  HCT 30.5* 25.1* 22.7*  PLT 284 214 219  APTT 35  --   --      Recent Labs  04/26/13 1135 04/27/13 0500 04/28/13 0430  NA 140 139 136*  K 4.9 3.9 3.9  CL 100 100 97  CO2 27 26 27   GLUCOSE 121* 102* 113*  BUN 42* 38* 28*  CREATININE 1.09 1.11 1.15  CALCIUM 10.3 9.5 9.1  MG  --  1.7 1.6  PHOS  --  4.5 4.5  PROT 8.7* 7.3 6.6  ALBUMIN 2.2* 1.9* 1.7*  AST 131* 217* 160*  ALT 119* 207* 186*  ALKPHOS 90 79 76  BILITOT 0.2* 0.3 <0.2*  PREALBUMIN  --  10.5*  --   TRIG  --  80  --    Estimated Creatinine Clearance: 58 ml/min (by C-G formula based on Cr of 1.15).    Recent Labs  04/27/13 1705 04/27/13 2358 04/28/13 0600  GLUCAP 84 115* 117*     Insulin Requirements in the past 24 hours:  None  Admit: 68 [facility]. [facility] up blood-tinged sputum. Noted pt with h/o recurrent PNA. 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  Unable to tell if TF ever tried via GJ tube.  Stool O/P 275mL Endo: hypothyroidism on levothyroxine. No hx of DM and no insulin in chronic TPN - CBGs controlled Lytes:  mild hyponatremia, magnesium low normal. Phos 4.5, K 3.9 - continued on PO Kphos tabs BID and KCl 20mEq BID PT as PTA. Renal: SCr 1.15, CrCL 58 ml/min.  MIVF: D10W at 95 ml/hr and NS at 75 ml/hr.  Great UOP Pulm: RA >> 2L [place] - on Dulera.  CT chest negative for PE. Cards: hx Afib not on AC.  VSS - on amiodarone, ASA 81 Hepatobil: AST/ALT elevated at baseline. TG WNL. Heme: Hgb 7.9 at baseline and decreased slightly, plts WNL Neuro: h/o depression on escitalopram.  Also on lactulose, morphine ID: Vanc/Zosyn for sepsis/UTI/pressure ulcers with necrotic tissue, hx recent HCAP.  C.diff negative, UA positive.  Afebrile, WBC improving.  VR 19 mcg/mL on admit (completed 2 wks of vanc PTA).   BCx grew GPC (1 of 2), UCx pending. Best Practices: Lovenox, lansoprazole PO TPN Access: PICC (from SNF) TPN day#: Chronic  Current Nutrition:  D10 at 95 ml/hr provides 775 kCal daily TPN PTA 96 ml/hr over 24 hours provides 2308 kcal, 112 gm protein  Nutritional Goals:  1800-2000 kCal and 100-110 gm protein per day   Plan:  - Change Clinimix to E 5/15 and keep rate at 83 ml/hr.  Continue IVFE at 10 ml/hr.  TPN will provide 1894 kCal and  100gm protein daily, meeting 100% of needs.    - Continue sensitive SSI Q6H, d/c if CBGs remain well controlled - Zinc 5mg  and Vitamin C 500mg  in TPN to aid in wound healing.  D/C'ed PT folic acid and thiamine as will be added in TPN as part of MVI. - MVI daily in TPN and trace elements M/W/F due to national shortage - Mag sulfate 2gm IV x 1 - F/U daily - Noted pt with GJ tub, consider GI or CCS consult to see if trial of tube feed is option.     Tasha Diaz D. Laney Potashang, PharmD, BCPS Pager:  [phone] 04/28/2013, 8:28 AM

## 2013-04-28 NOTE — Evaluation (Signed)
Clinical/Bedside Swallow Evaluation Patient Details  Name: Craig GuerinWilliam Kleiman MRN: [id number] Date of Birth: 1945-02-22  Today's Date: 04/28/2013 Time: [id number] SLP Time Calculation (min): 20 min  Past Medical History:  Past Medical History  Diagnosis Date  . COPD (chronic obstructive pulmonary disease)   . Hypertension   . Myocardial infarction   . A-fib   . GERD (gastroesophageal reflux disease)    Past Surgical History: History reviewed. No pertinent past surgical history. HPI:  68 year old male patient resident of a skilled nursing facility admitted with  fever and hypoxia x 24 hours. Patient has a significant past medical history of tobacco, crack abuse and possible alcohol abuse, failure to thrive, chronic stage IV sacral decubitus as well as bilateral heel and ankle ulcers. S/p diverting colostomy. Apparently was hospitalized in December 2014 for influenza and HCAP. G-tube placed 01/2013.  [facility] and as noted was subsequently discharged to the skilled nursing facility. It is unclear from the notes why TNA was being utilized in leu of the G tube for tube feeding. Records from December 2014 admission to [facility] reveal that the patient was suspected of having dysphagia and an outpatient speech evaluation was recommended to be completed at the long-term care facility unfortunately I do not have documentation regarding this Patient presents to this facility with a PEG tube but unclear if functioning or if was actually being used at the nursing facility other than for medications. Per patient, he has not had anything by mouth for the past 2 years, unclear origin.    Assessment / Plan / Recommendation Clinical Impression  Based on bedside exam, patient presents with a functional appearing oropharyngeal swallow without overt indication of aspiration. Patient however with unclear h/o dysphagia. Reports that he has not taken anything by mouth in approximately 2 years. Per  SLP at [facility], patients mentation during admission was not appropriate for pos or formal swallow evaluation. In light of unclear history, recommend proceeding with MBS to instrumentally evaluate function and determine potential to initiate a diet. MD in agreement. [facility]     Aspiration Risk       Diet Recommendation NPO   Medication Administration: Via alternative means    Other  Recommendations Recommended Consults: MBS Oral Care Recommendations:  (QID)   Follow Up Recommendations   (TBD)    Frequency and Duration        Pertinent Vitals/Pain n/a        Swallow Study    General HPI: 68 year old male patient resident of a skilled nursing facility admitted with  fever and hypoxia x 24 hours. Patient has a significant past medical history of tobacco, crack abuse and possible alcohol abuse, failure to thrive, chronic stage IV sacral decubitus as well as bilateral heel and ankle ulcers. S/p diverting colostomy. Apparently was hospitalized in December 2014 for influenza and HCAP. G-tube placed 01/2013.  [facility] and as noted was subsequently discharged to the skilled nursing facility. It is unclear from the notes why TNA was being utilized in leu of the G tube for tube feeding. Records from December 2014 admission [facility] reveal that the patient was suspected of having dysphagia and an outpatient speech evaluation was recommended to be completed at the long-term care facility unfortunately I do not have documentation regarding this Patient presents to this facility with a PEG tube but unclear if functioning or if was actually being used at the nursing facility other than for  medications. Per patient, he has not had anything by mouth for the past 2 years, unclear origin.  Type of Study: Bedside swallow evaluation Previous Swallow Assessment: none noted, per S[facility], mentation did not allow for swallow  assessment Diet Prior to this Study: NPO;PEG tube;TNA Temperature Spikes Noted: No Respiratory Status: Nasal cannula History of Recent Intubation: No Behavior/Cognition: Alert;Cooperative;Pleasant mood Oral Cavity - Dentition: Missing dentition;Poor condition Self-Feeding Abilities: Able to feed self Patient Positioning: Upright in bed Baseline Vocal Quality: Clear Volitional Cough: Strong Volitional Swallow: Able to elicit    Oral/Motor/Sensory Function Overall Oral Motor/Sensory Function: Appears within functional limits for tasks assessed   Ice Chips Ice chips: Not tested   Thin Liquid Thin Liquid: Within functional limits Presentation: Cup;Self Fed    Nectar Thick Nectar Thick Liquid: Not tested   Honey Thick Honey Thick Liquid: Not tested   Puree Puree: Within functional limits Presentation: Spoon   Solid   GO   Adilynn Bessey MA, CCC-SLP [phone]  Solid: Within functional limits Presentation: Self Fed       Yailyn Strack Meryl Mackenzi Krogh 04/28/2013,2:54 PM

## 2013-04-28 NOTE — Progress Notes (Signed)
Paged Donnamarie PoagK. Kirby of [facility] regarding pt's positive blood cultures. No orders received, pt already on antibiotics. Day Team will be informed and pt may need PICC line cultured. Will continue to monitor and assess.

## 2013-04-28 NOTE — Care Management Note (Signed)
    Page 1 of 1   05/03/2013     11:21:59 AM CARE MANAGEMENT NOTE 05/03/2013  Patient:  Craig [name],Craig [name]   Account Number:  [id number]  Date Initiated:  04/28/2013  Documentation initiated by:  GRAVES-BIGELOW,BRENDA  Subjective/Objective Assessment:   Pt admitted for fever, leukocytosis with WBC of 16.9.Treating for Sepsis.  [facility]. Pt has PEG tube & PICC line.     Action/Plan:   CSW to assist with dispositon needs when medically stable for d/c.   Anticipated DC Date:  05/03/2013   Anticipated DC Plan:  SKILLED NURSING FACILITY  In-house referral  Clinical Social Worker      DC Planning Services  CM consult      Choice offered to / List presented to:             Status of service:  Completed, signed off Medicare Important Message given?   (If response is "NO", the following Medicare IM given date fields will be blank) Date Medicare IM given:   Date Additional Medicare IM given:    Discharge Disposition:  SKILLED NURSING FACILITY  Per UR Regulation:  Reviewed for med. necessity/level of care/duration of stay  If discussed at Long Length of Stay Meetings, dates discussed:    Comments:

## 2013-04-28 NOTE — Progress Notes (Signed)
Craig [name]  Craig [name] ZOX:[id number] DOB: [birth year] DOA: 04/26/2013 PCP: No primary provider on file.  Time spent : 35 minutes  Brief narrative: 68 year old male patient resident of a skilled nursing facility who has never been hospitalized at [name]. Was sent from the skilled nursing facility because of fever and hypoxia x 24 hours. Patient has a significant past medical history of failure to thrive, chronic stage IV sacral decubitus as well as bilateral heel and ankle ulcers. He required a diverting colostomy. He has had a PEG tube placed in the past and has a PICC line in place for chronic parenteral nutrition. He was recently admitted to the skilled nursing facility after spending 2 months at [name] after being treated at [name] for complications related to the decubitus and subsequent formation of a diverting colostomy.  He apparently presented to the nursing facility from the [name] with plans to continue antibiotics for 2 weeks. Patient recently completed a vancomycin course 24 hours prior to development of these symptoms. In addition to the fever and hypoxemia he reportedly had developed hemoptysis. In the emergency department his room air saturations were 81%. He had a leukocytosis of 15,000 and had no output from his colostomy.  The electronic medical records from [name] as well as [name] have been reviewed in detail back to 2010. He has a significant history of poor social situation including homelessness that required living in shelters as well as being incarcerated in the county jail. 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 An October visit to the [name] documented poor living conditions and that the patient had been bed bound for several months but it was unclear if he had a decubitus at that time. He was discharged to a skilled nursing  facility.  He returned to the [name] in December of 2014 for influenza and healthcare acquired pneumonia and at that time a documented stage IV decubitus. He was later found to have Proteus pneumonia as well as E. Faecali bacteremia. CT scan done during that admission revealed sacral and coccygeal osteomyelitis but a three-phase bone scan done on 01/12/2013 demonstrated no evidence of osteomyelitis. Patient also has a history of atrial fibrillation but given his poor social situation he apparently was deemed an inappropriate candidate for long-term anticoagulation.  He underwent a diverting colostomy and apparent insertion of a gastric feeding tube in January 2015 and eventually was sent to [name] and as noted was subsequently discharged to the skilled nursing facility.  It is unclear from the notes why TNA was being utilized in leu of the G tube for tube feeding.  Per the encounter in December 2014 at [name] a sister is his legal guardian. A palliative evaluation was undertaken at that time and although she did not wish to change the patient's CODE STATUS from full resuscitation but she did wish to keep the patient comfortable.  HPI/Subjective: Patient appears very weak with limited ability to respond verbally and therefore history was difficult to obtain. No apparent complaints regarding chest pain, shortness of breath or any areas of significant pain.  Assessment/Plan:  Sepsis:   A) GNR UTI (lower urinary tract infection)   B) Severe Pressure ulcers of bilateral feet w/ extensive necrotic tissue  -Above problems appear to be etiology for sepsis -FU on urine cx: > 100,000 colonies GNRs -XR feet: Right foot without evidence  of osteomyelitis but left foot x-ray has possible area of osteomyelitis on fifth metatarsal -D/W Ortho Dr. Eulah PontMurphy and since pt not diabetic he deferred to [facility]-will wait on ABIs before call [facility] -ABIs pending -may need Ortho eval to consider  debridement v/s amputations -cont empiric anbx- dc Cefepime in favor of Zosyn- cont Vanco -wound care per WOC RN -may have a degree of infectious related encpehalopathy -cont morphine for pain   ? GPC Bacteremia [name]/2 blood cx's positive for GPCs- has PICC line so could be source esp since using for TNA-if both sets positive would need to dc PICC -h.o bacteremia recently and had just completed rxn  Acute hypoxic respiratory failure/COPD  -compensated without wheezing  -after hydration although lungs are clear on exam is now requiring oxygen so may need FU CXR to exclude PNA  Transaminitis /Hepatitis B -LFTs had increased slightly since admission and both AST and ALT greater than 200 with a normal total bilirubin-now trend is downward -This is likely a combination of ongoing use of parenteral nutrition as well as possible issues related to recent hypoxemia and physiological stressors -Follow labs -[facility] demonstrated normal liver and biliary tree as well as normal pancreas and spleen and adrenal glands -Patient on Chronulac for presumed constipation prevention since no evidence of cirrhosis or hepatic encephalopathy in the past  Unspecified hypothyroidism -cont Synthroid  Sacral decubitus ulcer, stage IV -clean and granulating well -cont wound VAC and other care as outlined by WOC RN  A-fib -currently maintaining NSR on Amiodarone  Dysphagia -Records from December 2014 admission to [facility] reveal that the patient was suspected of having dysphagia and an outpatient speech evaluation was recommended to be completed at the long-term care facility unfortunately I do not have documentation regarding this -Patient presents to this facility with a PEG tube but unclear if functioning or if was actually being used at the nursing facility other than for medications -Check abdominal x-ray confirms tube placement -Speech evaluation to determine if  patient can attempt oral diet noting medication orders at nursing facilities state by mouth and patient may have been taking his medications by mouth prior to admission-plan is for MBSS  Diverticulosis -No apparent abdominal pain at this time -Continue Chronulac to prevent constipation  Depression -Seems to be a long-standing issue -Continue Lexapro  GERD -Continue Prevacid  FTT in adult / Protein-calorie malnutrition, severe / On total parenteral nutrition (TPN) -As above -Prealbumin 10.5 -Preferred route of nutrition would be enteral especially if need to increase protein level  -consider PT/OT eval soon -if confirms can eat or can feed via GT would like to dc TNA    DVT prophylaxis: Lovenox Code Status: Full Family Communication: No family at bedside but per previous [facility] at [name] states patient's sister is his legal guardian Disposition Plan: Stepdown  Consultants: None  Procedures: Ankle brachial index - pending  Antibiotics: Cefepime 4/14 >>> 4/15 Zosyn 4/15 >> Vancomycin 4/14 >>>  Objective: Blood pressure 114/41, pulse 85, temperature 97.6 F (36.4 C), temperature source Oral, resp. rate 20, height 5\' 8"  ([name].727 m), weight 143 lb (64.864 kg), SpO2 98.00%.  Intake/Output Summary (Last 24 hours) at 04/28/13 1142 Last data filed at 04/28/13 1103  Gross per 24 hour  Intake 4951.33 ml  Output   3250 ml  Net 1701.33 ml   Exam: General: No acute respiratory distress - appears chronically ill/cachectic  ENT; oral mucous membranes dry, poor dentition and thick retained  secretions Lungs: Clear to auscultation bilaterally without wheezes or crackles, 2L Cardiovascular: Regular rate and rhythm without murmur gallop or rub normal S1 and S2, no peripheral edema or JVD Abdomen: Nontender, nondistended, soft, bowel sounds positive, no rebound, no ascites, no appreciable mass-colostomy with flatus and stool in bag, gastrostomy tube clamped and is dark in  color with some oozing around the insertion site and redness of skin Musculoskeletal: No significant cyanosis, clubbing of bilateral lower extremities Neurological: Awake and more verbally responsive, generalized weakness without any obvious focal neurological deficits, speech difficult to understand  Scheduled Meds:  Scheduled Meds: . amiodarone  200 mg Per Tube Daily  . aspirin  81 mg Per Tube Daily  . collagenase  [name] application Topical TID  . enoxaparin (LOVENOX) injection  40 mg Subcutaneous Q24H  . escitalopram  10 mg Per Tube Daily  . insulin aspart  0-9 Units Subcutaneous 4 times per day  . lactulose  20 g Per Tube Daily  . lansoprazole  30 mg Per Tube Daily  . levothyroxine  150 mcg Per Tube QAC breakfast  . magnesium sulfate [name] - 4 g bolus IVPB  2 g Intravenous Once  . mometasone-formoterol  2 puff Inhalation BID  . morphine CONCENTRATE  5 mg Per Tube Q6H  . phosphorus  250 mg Per Tube BID  . piperacillin-tazobactam (ZOSYN)  IV  3.375 g Intravenous Q8H  . sodium chloride  3 mL Intravenous Q12H  . vancomycin  [name],000 mg Intravenous Q24H   Continuous Infusions: . sodium chloride 30 mL/hr at 04/28/13 0827  . Marland Kitchen.TPN (CLINIMIX-E) Adult 83 mL/hr at 04/28/13 0900   And  . fat emulsion 500 kcal (04/28/13 0900)  . Marland Kitchen.TPN (CLINIMIX-E) Adult     And  . fat emulsion      Data Reviewed: Basic Metabolic Panel:  Recent Labs Lab 04/26/13 1135 04/27/13 0500 04/28/13 0430  NA 140 139 136*  K 4.9 3.9 3.9  CL 100 100 97  CO2 27 26 27   GLUCOSE 121* 102* 113*  BUN 42* 38* 28*  CREATININE [name].09 [name].11 [name].15  CALCIUM 10.3 9.5 9.[name]  MG  --  [name].7 [name].6  PHOS  --  4.5 4.5   Liver Function Tests:  Recent Labs Lab 04/26/13 1135 04/27/13 0500 04/28/13 0430  AST 131* 217* 160*  ALT 119* 207* 186*  ALKPHOS 90 79 76  BILITOT 0.2* 0.3 <0.2*  PROT 8.7* 7.3 6.6  ALBUMIN 2.2* [name].9* [name].7*   CBC:  Recent Labs Lab 04/26/13 1135 04/27/13 0500 04/28/13 0430  WBC 16.9* 14.5* 11.5*  NEUTROABS  12.0* 9.2*  --   HGB 9.8* 7.9* 7.2*  HCT 30.5* 25.[name]* 22.7*  MCV 78.8 78.9 77.2*  PLT 284 214 219    CBG:  Recent Labs Lab 04/26/13 2334 04/27/13 1139 04/27/13 1705 04/27/13 2358 04/28/13 0600  GLUCAP 118* 99 84 115* 117*    Recent Results (from the past 240 hour(s))  URINE CULTURE     Status: None   Collection Time    04/26/13 11:20 AM      Result Value Ref Range Status   Specimen Description URINE, CATHETERIZED   Final   Special Requests Normal   Final   Culture  Setup Time     Final   Value: 04/26/2013 12:09     Performed at [facility]   Colony Count     Final   Value: >=100,000 COLONIES/ML     Performed at [facility]  Culture     Final   Value: GRAM NEGATIVE RODS     Performed at [facility]   Report Status PENDING   Incomplete  CULTURE, BLOOD (ROUTINE X 2)     Status: None   Collection Time    04/26/13 11:35 AM      Result Value Ref Range Status   Specimen Description BLOOD LEFT HAND   Final   Special Requests BOTTLES DRAWN AEROBIC AND ANAEROBIC 5CC   Final   Culture  Setup Time     Final   Value: 04/26/2013 16:17     Performed at [facility]   Culture     Final   Value: GRAM POSITIVE COCCI IN CLUSTERS     [name]: Gram Stain Report Called to,Read Back By and Verified With: MATT YORK 04/28/13 1239A FULKC     Performed at [facility]   Report Status PENDING   Incomplete  CULTURE, BLOOD (ROUTINE X 2)     Status: None   Collection Time    04/26/13 11:45 AM      Result Value Ref Range Status   Specimen Description BLOOD LEFT ANTECUBITAL   Final   Special Requests BOTTLES DRAWN AEROBIC AND ANAEROBIC 5CC   Final   Culture  Setup Time     Final   Value: 04/26/2013 16:17     Performed at [facility]   Culture     Final   Value:        BLOOD CULTURE RECEIVED NO GROWTH TO DATE CULTURE WILL BE HELD FOR 5 DAYS BEFORE ISSUING A FINAL NEGATIVE REPORT     Performed at [facility]   Report Status PENDING    Incomplete  CLOSTRIDIUM DIFFICILE BY PCR     Status: None   Collection Time    04/26/13  5:46 PM      Result Value Ref Range Status   C difficile by pcr NEGATIVE  NEGATIVE Final  MRSA PCR SCREENING     Status: None   Collection Time    04/26/13  6:28 PM      Result Value Ref Range Status   MRSA by PCR NEGATIVE  NEGATIVE Final   Comment:            The GeneXpert MRSA Assay (FDA     approved for NASAL specimens     only), is one component of a     comprehensive MRSA colonization     surveillance program. It is not     intended to diagnose MRSA     infection nor to guide or     monitor treatment for     MRSA infections.     Studies:  Recent x-ray studies have been reviewed in detail by the Attending Physician       Junious Silk, ANP [facility] Office  [phone] Pager [phone]  **If unable to reach the above provider after paging please contact the Flow Manager @ [phone]  On-Call/Text Page:      [email or web address]      password [email or web address]  If 7PM-7AM, please contact night-coverage [email or web address] Password [email or web address] 04/28/2013, 11:42 AM   Craig: 2 days   Have examined the patient and reviewed assessment and plan with ANP Revonda Standard and agree with the above plan. Plan explained to patient and all questions answered

## 2013-04-29 ENCOUNTER — Inpatient Hospital Stay (HOSPITAL_COMMUNITY): Payer: Medicare Other

## 2013-04-29 DIAGNOSIS — L98499 Non-pressure chronic ulcer of skin of other sites with unspecified severity: Secondary | ICD-10-CM

## 2013-04-29 DIAGNOSIS — D649 Anemia, unspecified: Secondary | ICD-10-CM | POA: Diagnosis present

## 2013-04-29 DIAGNOSIS — I739 Peripheral vascular disease, unspecified: Secondary | ICD-10-CM

## 2013-04-29 LAB — CBC
HCT: 23.8 % — ABNORMAL LOW (ref 39.0–52.0)
Hemoglobin: 7.5 g/dL — ABNORMAL LOW (ref 13.0–17.0)
MCH: 24.4 pg — AB (ref 26.0–34.0)
MCHC: 31.5 g/dL (ref 30.0–36.0)
MCV: 77.5 fL — ABNORMAL LOW (ref 78.0–100.0)
PLATELETS: 258 10*3/uL (ref 150–400)
RBC: 3.07 MIL/uL — AB (ref 4.22–5.81)
RDW: 19.4 % — ABNORMAL HIGH (ref 11.5–15.5)
WBC: 11.9 10*3/uL — AB (ref 4.0–10.5)

## 2013-04-29 LAB — CULTURE, BLOOD (ROUTINE X 2)

## 2013-04-29 LAB — BASIC METABOLIC PANEL
BUN: 31 mg/dL — ABNORMAL HIGH (ref 6–23)
CALCIUM: 9.2 mg/dL (ref 8.4–10.5)
CHLORIDE: 100 meq/L (ref 96–112)
CO2: 25 mEq/L (ref 19–32)
CREATININE: 1.12 mg/dL (ref 0.50–1.35)
GFR calc non Af Amer: 67 mL/min — ABNORMAL LOW (ref >90)
GFR, EST AFRICAN AMERICAN: 77 mL/min — AB (ref >90)
Glucose, Bld: 102 mg/dL — ABNORMAL HIGH (ref 70–99)
Potassium: 4.2 mEq/L (ref 3.7–5.3)
SODIUM: 136 meq/L — AB (ref 137–147)

## 2013-04-29 LAB — MAGNESIUM: Magnesium: 1.9 mg/dL (ref 1.5–2.5)

## 2013-04-29 LAB — GLUCOSE, CAPILLARY
GLUCOSE-CAPILLARY: 142 mg/dL — AB (ref 70–99)
Glucose-Capillary: 103 mg/dL — ABNORMAL HIGH (ref 70–99)

## 2013-04-29 MED ORDER — ZINC SULFATE 220 (50 ZN) MG PO CAPS
220.0000 mg | ORAL_CAPSULE | Freq: Every day | ORAL | Status: DC
  Administered 2013-04-29 – 2013-05-03 (×5): 220 mg via ORAL
  Filled 2013-04-29 (×5): qty 1

## 2013-04-29 MED ORDER — VITAMIN B-1 100 MG PO TABS
100.0000 mg | ORAL_TABLET | Freq: Every day | ORAL | Status: DC
  Administered 2013-04-29 – 2013-05-03 (×5): 100 mg via ORAL
  Filled 2013-04-29 (×5): qty 1

## 2013-04-29 MED ORDER — LANSOPRAZOLE 30 MG PO CPDR
30.0000 mg | DELAYED_RELEASE_CAPSULE | Freq: Every day | ORAL | Status: DC
  Administered 2013-04-30 – 2013-05-03 (×4): 30 mg via ORAL
  Filled 2013-04-29 (×4): qty 1

## 2013-04-29 MED ORDER — BIOTENE DRY MOUTH MT LIQD
15.0000 mL | Freq: Two times a day (BID) | OROMUCOSAL | Status: DC
  Administered 2013-04-29 – 2013-05-03 (×8): 15 mL via OROMUCOSAL

## 2013-04-29 MED ORDER — COLLAGENASE 250 UNIT/GM EX OINT
TOPICAL_OINTMENT | Freq: Every day | CUTANEOUS | Status: DC
Start: 2013-04-29 — End: 2013-05-03
  Administered 2013-04-29 – 2013-05-03 (×4): via TOPICAL
  Filled 2013-04-29 (×3): qty 30

## 2013-04-29 MED ORDER — ONDANSETRON HCL 4 MG PO TABS: 4.0000 mg | ORAL_TABLET | Freq: Four times a day (QID) | ORAL | Status: DC | PRN

## 2013-04-29 MED ORDER — FAT EMULSION 20 % IV EMUL
250.0000 mL | INTRAVENOUS | Status: DC
  Filled 2013-04-29: qty 250

## 2013-04-29 MED ORDER — VITAMIN C 500 MG PO TABS
500.0000 mg | ORAL_TABLET | Freq: Every day | ORAL | Status: DC
  Administered 2013-04-29 – 2013-05-03 (×5): 500 mg via ORAL
  Filled 2013-04-29 (×5): qty 1

## 2013-04-29 MED ORDER — K PHOS MONO-SOD PHOS DI & MONO 155-852-130 MG PO TABS
250.0000 mg | ORAL_TABLET | Freq: Two times a day (BID) | ORAL | Status: DC
  Administered 2013-04-30 – 2013-05-03 (×7): 250 mg via ORAL
  Filled 2013-04-29 (×9): qty 1

## 2013-04-29 MED ORDER — ASPIRIN 81 MG PO CHEW
81.0000 mg | CHEWABLE_TABLET | Freq: Every day | ORAL | Status: DC
  Administered 2013-04-30 – 2013-05-03 (×4): 81 mg via ORAL
  Filled 2013-04-29 (×4): qty 1

## 2013-04-29 MED ORDER — BOOST / RESOURCE BREEZE PO LIQD
1.0000 | Freq: Three times a day (TID) | ORAL | Status: DC
  Administered 2013-04-29 – 2013-05-02 (×5): 1 via ORAL

## 2013-04-29 MED ORDER — MORPHINE SULFATE (CONCENTRATE) 10 MG /0.5 ML PO SOLN
10.0000 mg | Freq: Every day | ORAL | Status: DC | PRN
  Administered 2013-04-29 – 2013-05-01 (×2): 10 mg via ORAL
  Filled 2013-04-29: qty 0.5

## 2013-04-29 MED ORDER — FOSFOMYCIN TROMETHAMINE 3 G PO PACK
3.0000 g | PACK | ORAL | Status: DC
  Administered 2013-04-29: 3 g via ORAL
  Filled 2013-04-29 (×2): qty 3

## 2013-04-29 MED ORDER — ONDANSETRON HCL 4 MG/2ML IJ SOLN: 4.0000 mg | Freq: Four times a day (QID) | INTRAMUSCULAR | Status: DC | PRN

## 2013-04-29 MED ORDER — LEVOTHYROXINE SODIUM 150 MCG PO TABS
150.0000 ug | ORAL_TABLET | Freq: Every day | ORAL | Status: DC
  Administered 2013-04-30 – 2013-05-03 (×4): 150 ug via ORAL
  Filled 2013-04-29 (×5): qty 1

## 2013-04-29 MED ORDER — M.V.I. ADULT IV INJ
INJECTION | INTRAVENOUS | Status: DC
  Filled 2013-04-29: qty 2000

## 2013-04-29 MED ORDER — ESCITALOPRAM OXALATE 10 MG PO TABS
10.0000 mg | ORAL_TABLET | Freq: Every day | ORAL | Status: DC
  Administered 2013-04-30 – 2013-05-03 (×4): 10 mg via ORAL
  Filled 2013-04-29 (×4): qty 1

## 2013-04-29 MED ORDER — FOLIC ACID 1 MG PO TABS
1.0000 mg | ORAL_TABLET | Freq: Every day | ORAL | Status: DC
  Administered 2013-04-29 – 2013-05-03 (×5): 1 mg via ORAL
  Filled 2013-04-29 (×5): qty 1

## 2013-04-29 MED ORDER — SODIUM CHLORIDE 0.9 % IV SOLN
250.0000 mg | Freq: Four times a day (QID) | INTRAVENOUS | Status: DC
  Administered 2013-04-29 (×2): 250 mg via INTRAVENOUS
  Filled 2013-04-29 (×4): qty 250

## 2013-04-29 MED ORDER — MORPHINE SULFATE (CONCENTRATE) 10 MG /0.5 ML PO SOLN
5.0000 mg | Freq: Four times a day (QID) | ORAL | Status: DC
  Administered 2013-04-29 – 2013-05-03 (×16): 5 mg via ORAL
  Filled 2013-04-29 (×17): qty 0.5

## 2013-04-29 MED ORDER — DOCUSATE SODIUM 100 MG PO CAPS
100.0000 mg | ORAL_CAPSULE | Freq: Two times a day (BID) | ORAL | Status: DC
  Administered 2013-05-01 – 2013-05-03 (×5): 100 mg via ORAL
  Filled 2013-04-29 (×9): qty 1

## 2013-04-29 MED ORDER — AMIODARONE HCL 200 MG PO TABS
200.0000 mg | ORAL_TABLET | Freq: Every day | ORAL | Status: DC
  Administered 2013-04-30 – 2013-05-03 (×4): 200 mg via ORAL
  Filled 2013-04-29 (×4): qty 1

## 2013-04-29 MED ORDER — CEFTAZIDIME 1 G IJ SOLR
1.0000 g | Freq: Three times a day (TID) | INTRAMUSCULAR | Status: DC
  Administered 2013-04-29 – 2013-05-03 (×14): 1 g via INTRAVENOUS
  Filled 2013-04-29 (×18): qty 1

## 2013-04-29 MED ORDER — CHLORHEXIDINE GLUCONATE 0.12 % MT SOLN
15.0000 mL | Freq: Two times a day (BID) | OROMUCOSAL | Status: DC
Start: 2013-04-29 — End: 2013-05-03
  Administered 2013-04-29 – 2013-05-03 (×8): 15 mL via OROMUCOSAL
  Filled 2013-04-29 (×11): qty 15

## 2013-04-29 NOTE — Procedures (Signed)
Objective Swallowing Evaluation: Modified Barium Swallowing Study  Patient Details  Name: [name] MRN: [id number] Date of Birth: [birth year]  Today's Date: 04/29/2013 Time: 0950-1010 SLP Time Calculation (min): 20 min  Past Medical History:  Past Medical History  Diagnosis Date  . COPD (chronic obstructive pulmonary disease)   . Hypertension   . Myocardial infarction   . A-fib   . GERD (gastroesophageal reflux disease)    Past Surgical History: History reviewed. No pertinent past surgical history. HPI:  68 year old male patient resident of a skilled nursing facility admitted with  fever and hypoxia x 24 hours. Patient has a significant past medical history of tobacco, crack abuse and possible alcohol abuse, failure to thrive, chronic stage IV sacral decubitus as well as bilateral heel and ankle ulcers. S/p diverting colostomy. Apparently was hospitalized in December 2014 for influenza and HCAP. G-tube placed 01/2013.  [facility] and as noted was subsequently discharged to the skilled nursing facility. It is unclear from the notes why TNA was being utilized in leu of the G tube for tube feeding. [facility] from December [facility] reveal that the patient was suspected of having dysphagia and an outpatient speech evaluation was recommended to be completed at the long-term care facility unfortunately I do not have documentation regarding this Patient presents to this facility with a PEG tube but unclear if functioning or if was actually being used at the nursing facility other than for medications. Per patient, he has not had anything by mouth for the past 2 years, unclear origin. Had clinical swallow eval 4/16 with recs to proceed with MBS.     Assessment / Plan / Recommendation Clinical Impression  Dysphagia Diagnosis: Mild oral phase dysphagia Clinical impression: Pt with quite functional oropharyngeal swallow, marked by mild oral preparation delays  (related to dentition,) and high penetration of thin liquids, but no aspiration, even when taxed with large, successive thin liquid boluses.  Recommend beginning a mechanical soft diet with thin liquids, meds whole with liquid.  Pt may need assist with tray set-up and self-feeding.  No further SLP warranted - will sign off.      Treatment Recommendation    none   Diet Recommendation Dysphagia 3 (Mechanical Soft);Thin liquid   Liquid Administration via: Cup;Straw Medication Administration: Whole meds with liquid Supervision: Staff to assist with self feeding (tray set-up) Postural Changes and/or Swallow Maneuvers: Seated upright 90 degrees    Other  Recommendations Oral Care Recommendations: Oral care BID   Follow Up Recommendations  None    SLP Swallow Goals     General HPI: 68 year old male patient resident of a skilled nursing facility admitted with  fever and hypoxia x 24 hours. Patient has a significant past medical history of tobacco, crack abuse and possible alcohol abuse, failure to thrive, chronic stage IV sacral decubitus as well as bilateral heel and ankle ulcers. S/p diverting colostomy. Apparently was hospitalized in December 2014 for influenza and HCAP. G-tube placed 01/2013.  [facility] and as noted was subsequently discharged to the skilled nursing facility. It is unclear from the notes why TNA was being utilized in leu of the G tube for tube feeding. [facility] that the patient was suspected of having dysphagia and an outpatient speech evaluation was recommended to be completed at the long-term care facility unfortunately I do not have documentation regarding this Patient presents to this facility with a PEG tube but unclear  if functioning or if was actually being used at the nursing facility other than for medications. Per patient, he has not had anything by mouth for the past 2 years, unclear origin.  Type of  Study: Modified Barium Swallowing Study Reason for Referral: Objectively evaluate swallowing function Previous Swallow Assessment: none noted, per [facility], mentation did not allow for swallow assessment Diet Prior to this Study: NPO;TNA (Jtube) Temperature Spikes Noted: No Respiratory Status: Nasal cannula History of Recent Intubation: No Behavior/Cognition: Alert;Cooperative;Pleasant mood Oral Cavity - Dentition: Missing dentition;Poor condition Oral Motor / Sensory Function: Impaired - see Bedside swallow eval Self-Feeding Abilities: Needs assist Patient Positioning: Upright in chair Volitional Cough: Strong Volitional Swallow: Able to elicit Anatomy: Within functional limits Pharyngeal Secretions: Not observed secondary MBS    Reason for Referral Objectively evaluate swallowing function   Oral Phase Oral Preparation/Oral Phase Oral Phase: Impaired Oral - Solids Oral - Mechanical Soft: Delayed oral transit   Pharyngeal Phase Pharyngeal Phase Pharyngeal Phase: Within functional limits (high penetration with liquids from straw, ejected from laryn)  Cervical Esophageal Phase   Ouita Nish L. Samson Fredericouture, [place] CCC/SLP Pager [phone]               Carolan Shivermanda Laurice Suhas Estis 04/29/2013, 10:35 AM

## 2013-04-29 NOTE — Progress Notes (Signed)
OT Cancellation Note  Patient Details Name: [name] MRN: [id number] DOB: Jun 10, 1945   Cancelled Treatment:    Reason Eval/Treat Not Completed: OT screened, no needs identified, will sign off - chart reviewed  And called [name], where pt was a resident.  Pt was fully dependent with all BADLs and required use of a hoyer lift for OOB transfers.  Pt with no acute OT needs.  Would recommend a trial of OT once he returns to SNF - unsure of his rehab potential.  Angelene GiovanniWendi M Talayah Picardi [place], OTR/L [phone] 04/29/2013, 2:02 PM

## 2013-04-29 NOTE — Progress Notes (Addendum)
NUTRITION CONSULT/FOLLOW UP  DOCUMENTATION CODES Per approved criteria  -Severe malnutrition in the context of chronic illness   INTERVENTION: Continue Resource Breeze po TID, each supplement provides 250 kcal and 9 grams of protein  If PO intake does not improve, may need to supplement with nocturnal TF via G-tube RD to follow for nutrition care plan  NUTRITION DIAGNOSIS: Increased nutrient needs related to malnutrition, multiple wounds as evidenced by estimated nutrition needs, ongoing  Goal: Pt to meet >/= 90% of their estimated nutrition needs, progressing  Monitor:  PO & supplemental intake, weight, labs, I/O's  ASSESSMENT: Patient with PMH of diverticulitis status post colostomy, A. fib not on anticoagulation, hypothyroidism, GERD, stage IV sacral decubitus ulcer and bilateral heel and ankle ulcers, generalized weakness who is a resident of SNF was sent from there with fever and hypoxia.  RD consulted.  Initial nutrition assessment completed 4/15.  Patient s/p MBSS 4/17.  Presented with mild oral phase dysphagia.  Diet advanced to Dysphagia 3, thin liquids.  Per RN, intake at lunch limited.  Ate some bites of chicken and greens.  Resource Breeze supplement ordered TID.  RD added chopped meats comment to diet order.  TPN D/C'd.  DG Portable Abdomen View 4/15 reviewed.  Patient has G-tube present with tip overlying the region of the stomach in left upper quadrant.  Height: Ht Readings from Last 1 Encounters:  04/26/13 5\' 8"  (1.727 m)    Weight: Wt Readings from Last 1 Encounters:  04/26/13 143 lb (64.864 kg)    Ideal Body Weight: 154 lb  % Ideal Body Weight: 92%  Wt Readings from Last 10 Encounters:  04/26/13 143 lb (64.864 kg)  04/26/13 144 lb (65.318 kg)  03/28/13 138 lb (62.596 kg)  03/18/13 131 lb (59.421 kg)  03/03/13 130 lb (58.968 kg)    Usual Body Weight: 138 lb  % Usual Body Weight: 104%  BMI:  Body mass index is 21.75 kg/(m^2).  Estimated  Nutritional Needs: Kcal: 1800-2000 Protein: 100-110 gm Fluid: 1.8-2.0 L  Skin: Stage IV sacral pressure ulcer, unstageable pressure ulcers to: R foot, L heel, L plantar foot and L malleolus  Diet Order: Dysphagia 3, thin liquids  EDUCATION NEEDS: -No education needs identified at this time   Intake/Output Summary (Last 24 hours) at 04/29/13 1359 Last data filed at 04/29/13 1121  Gross per 24 hour  Intake   1729 ml  Output   2800 ml  Net  -1071 ml    Labs:   Recent Labs Lab 04/26/13 1135 04/27/13 0500 04/28/13 0430 04/29/13 0740  NA 140 139 136* 136*  K 4.9 3.9 3.9 4.2  CL 100 100 97 100  CO2 27 26 27 25   BUN 42* 38* 28* 31*  CREATININE 1.09 1.11 1.15 1.12  CALCIUM 10.3 9.5 9.1 9.2  MG  --  1.7 1.6 1.9  PHOS  --  4.5 4.5  --   GLUCOSE 121* 102* 113* 102*    CBG (last 3)   Recent Labs  04/28/13 1659 04/28/13 2351 04/29/13 0559  GLUCAP 119* 142* 103*    Scheduled Meds: . [START ON 04/30/2013] amiodarone  200 mg Oral Daily  . antiseptic oral rinse  15 mL Mouth Rinse q12n4p  . [START ON 04/30/2013] aspirin  81 mg Oral Daily  . cefTAZidime (FORTAZ)  IV  1 g Intravenous 3 times per day  . chlorhexidine  15 mL Mouth Rinse BID  . collagenase   Topical Daily  . docusate sodium  100 mg Oral BID  . enoxaparin (LOVENOX) injection  40 mg Subcutaneous Q24H  . [START ON 04/30/2013] escitalopram  10 mg Oral Daily  . feeding supplement (RESOURCE BREEZE)  1 Container Oral TID BM  . folic acid  1 mg Oral Daily  . imipenem-cilastatin  250 mg Intravenous 4 times per day  . [START ON 04/30/2013] lansoprazole  30 mg Oral Daily  . [START ON 04/30/2013] levothyroxine  150 mcg Oral QAC breakfast  . mometasone-formoterol  2 puff Inhalation BID  . morphine CONCENTRATE  5 mg Oral Q6H  . phosphorus  250 mg Oral BID  . sodium chloride  3 mL Intravenous Q12H  . thiamine  100 mg Oral Daily  . vitamin C  500 mg Oral Daily  . zinc sulfate  220 mg Oral Daily    Continuous  Infusions: . sodium chloride 50 mL/hr (04/29/13 1120)    Past Medical History  Diagnosis Date  . COPD (chronic obstructive pulmonary disease)   . Hypertension   . Myocardial infarction   . A-fib   . GERD (gastroesophageal reflux disease)     History reviewed. No pertinent past surgical history.  Maureen ChattersKatie Eryka Dolinger, RD, LDN Pager #: [phone] After-Hours Pager #: [phone]

## 2013-04-29 NOTE — Progress Notes (Addendum)
PARENTERAL NUTRITION CONSULT NOTE - FOLLOW UP  Pharmacy Consult:  TPN Indication: Chronic TPN (severe PCM, poor wound healing s/p diverting colostomy and gastrojejunostomy per SNF note)  No Known Allergies  Patient Measurements: Height: 5\' 8"  (172.7 cm) Weight: 143 lb (64.864 kg) IBW/kg (Calculated) : 68.4  Vital Signs: Temp: 97.9 F (36.6 C) (04/17 0700) Temp src: Oral (04/17 0700) BP: 108/36 mmHg (04/17 0345) Pulse Rate: 82 (04/17 0345) Intake/Output from previous day: 04/16 0701 - 04/17 0700 In: 2550 [I.V.:733; IV Piggyback:50; TPN:1767] Out: 2800 [Urine:2600; Stool:200]  Labs:  Recent Labs  04/26/13 1135 04/27/13 0500 04/28/13 0430  WBC 16.9* 14.5* 11.5*  HGB 9.8* 7.9* 7.2*  HCT 30.5* 25.1* 22.7*  PLT 284 214 219  APTT 35  --   --      Recent Labs  04/26/13 1135 04/27/13 0500 04/28/13 0430  NA 140 139 136*  K 4.9 3.9 3.9  CL 100 100 97  CO2 27 26 27   GLUCOSE 121* 102* 113*  BUN 42* 38* 28*  CREATININE 1.09 1.11 1.15  CALCIUM 10.3 9.5 9.1  MG  --  1.7 1.6  PHOS  --  4.5 4.5  PROT 8.7* 7.3 6.6  ALBUMIN 2.2* 1.9* 1.7*  AST 131* 217* 160*  ALT 119* 207* 186*  ALKPHOS 90 79 76  BILITOT 0.2* 0.3 <0.2*  PREALBUMIN  --  10.5*  --   TRIG  --  80  --    Estimated Creatinine Clearance: 58 ml/min (by C-G formula based on Cr of 1.15).    Recent Labs  04/28/13 1107 04/28/13 1659 04/28/13 2351  GLUCAP 121* 119* 142*     Insulin Requirements in the past 24 hours:  1 units SSI  Admit: 68 y.o. male [facility] coughing up blood-tinged sputum. Noted pt with h/o recurrent PNA. 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  Patient continues on TPN from PTA and is tolerating it well.  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  Unable to tell if TF ever tried via GJ tube.  Swallow eval completed and pt to remain NPO.  Stool O/P  275mL Endo: hypothyroidism on levothyroxine. No hx of DM and no insulin in chronic TPN - CBGs controlled Lytes: mild hyponatremia. Phos 4.5, K 4.2 - continued on PO Kphos tabs BID and KCl 20mEq BID PT as PTA. Renal: SCr 1.12 (stable), CrCL 60 ml/min. Great UOP - NS at 30 ml/hr Pulm: simple mask - on Dulera.  CT chest negative for PE. Cards: hx Afib not on AC.  VSS - on amiodarone, ASA 81 Hepatobil: AST/ALT elevated at baseline. TG WNL. Heme: Hgb 7.9 at baseline and decreased slightly, plts WNL Neuro: hx depression on escitalopram.  Also on lactulose, morphine ID: Vanc/Fortaz/Primaxin for possible GPC bacteremia and Klebsiella/Pseudomonas UTI, has pressure ulcers with necrotic tissue, hx recent HCAP.  Afebrile, WBC improving.  VR 19 mcg/mL on admit (completed 2 wks of vanc PTA).   BCx grew GPC (1 of 2) Best Practices: Lovenox, lansoprazole PO TPN Access: PICC (from SNF) TPN day#: Chronic  Current Nutrition:  TPN PTA TPN:  96 ml/hr over 24 hours provides 2308 kcal, 112 gm protein  Nutritional Goals:  1800-2000 kCal and 100-110 gm protein per day   Plan:  - Continue Clinimix E 5/15 at 83 ml/hr and IVFE at 10 ml/hr.  TPN will provide 1894 kCal  and 100gm protein daily, meeting 100% of needs.    - Zinc 5mg  and Vitamin C 500mg  in TPN to aid in wound healing.  D/C'ed PT folic acid and thiamine as will be added in TPN as part of MVI. - MVI daily in TPN and trace elements M/W/F due to national shortage - D/C SSI/CBG checks - F/U daily (watch K+) - Noted pt with GJ tub, consider GI or CCS consult to see if trial of tube feed is option.     Craig [name] D. Laney Potashang, PharmD, BCPS Pager:  [phone] 04/29/2013, 9:23 AM    ================================================  Addendum: - received order to d/c TPN - passed swallow eval, starting mechanical soft diet   Plan: - Decrease Clinimix to 40 ml/hr, then stop at 1800 (RN aware) - Zinc 220mg  PO daily - Vit C 500mg  PO daily - Folic acid 1mg  PO  daily - Thiamine 100mg  PO daily - D/C TPN labs    Craig [name] D. Laney Potashang, PharmD, BCPS Pager:  [phone] 04/29/2013, 11:04 AM

## 2013-04-29 NOTE — Progress Notes (Addendum)
VASCULAR LAB PRELIMINARY  PRELIMINARY  PRELIMINARY  PRELIMINARY  VASCULAR LAB PRELIMINARY  ARTERIAL  ABI completed:    RIGHT    LEFT    PRESSURE WAVEFORM  PRESSURE WAVEFORM  BRACHIAL 115 Triphasic BRACHIAL Restricted limb Triphasic  DP Not found possibly due the jerking of foot  DP 77 Monophasic  PT 75 Monophasic PT 97 Biphasic  PER 100 Monophasic sounds biphasic       RIGHT LEFT  ABI 0.87 0.71   ABI indicates a mild reduction in arterial flow on the right and moderate reduction on the left  Craig [name], RVS 04/29/2013, 11:53 AM

## 2013-04-29 NOTE — Progress Notes (Signed)
PT Cancellation and Discharge Note  Patient Details Name: Craig GuerinWilliam [name] MRN: [id number] DOB: Aug 19, 1945   Cancelled Treatment:    Reason Eval/Treat Not Completed: PT screened, no needs identified, will sign off.  [facility] pt was dependent with all ADLs and was only able to get OOB with a [employer] Lift.  No acute therapy needs at this time.  Recommend continue to use [employer] Lift for OOB activity and defer PT to SNF level.  Will sign off.     Alison MurrayMegan F Siedah Sedor 04/29/2013, 2:01 PM

## 2013-04-29 NOTE — Plan of Care (Signed)
Urine cx final results reviewed- note MDR Klebsiella only sensitive to Imipenem- also has Pseudomomas with less resistant pattern and sensitive to [place]- will dc Zosyn and ask pharmacy to dose the new anbx's. 1/2 blood cx's positive for GPC in clusters so cont Vanco until cx's final.  Junious SilkAllison Ellis, ANP

## 2013-04-29 NOTE — Progress Notes (Addendum)
ANTIBIOTIC CONSULT NOTE - FOLLOW UP  Pharmacy Consult for imipenem, ceftazidime, continue vancomycin Indication: UTI, 1 of 2 BC coag neg staph  No Known Allergies  Patient Measurements: Height: 5\' 8"  (172.7 cm) Weight: 143 lb (64.864 kg) IBW/kg (Calculated) : 68.4   Vital Signs: Temp: 97.9 F (36.6 C) (04/17 0700) Temp src: Oral (04/17 0700) BP: 98/40 mmHg (04/17 0800) Pulse Rate: 82 (04/17 0345) Intake/Output from previous day: 04/16 0701 - 04/17 0700 In: 2550 [I.V.:733; IV Piggyback:50; TPN:1767] Out: 2800 [Urine:2600; Stool:200] Intake/Output from this shift: Total I/O In: -  Out: 650 [Urine:650]  Labs:  Recent Labs  04/27/13 0500 04/28/13 0430 04/29/13 0740  WBC 14.5* 11.5* 11.9*  HGB 7.9* 7.2* 7.5*  PLT 214 219 258  CREATININE 1.11 1.15 1.12   Estimated Creatinine Clearance: 59.6 ml/min (by C-G formula based on Cr of 1.12).  Recent Labs  04/26/13 1937  VANCOTROUGH 19.0     Microbiology: Recent Results (from the past 720 hour(s))  URINE CULTURE     Status: None   Collection Time    04/26/13 11:20 AM      Result Value Ref Range Status   Specimen Description URINE, CATHETERIZED   Final   Special Requests Normal   Final   Culture  Setup Time     Final   Value: 04/26/2013 12:09     Performed at [facility]   Colony Count     Final   Value: >=100,000 COLONIES/ML     Performed at [facility]   Culture     Final   Value: KLEBSIELLA PNEUMONIAE     Note: Confirmed Extended Spectrum Beta-Lactamase Producer (ESBL) CRITICAL RESULT CALLED TO, READ BACK BY AND VERIFIED WITH: IRBY AY 5:48 A.M. ON 04/29/13 WARRB     PSEUDOMONAS AERUGINOSA     Performed at [facility]   Report Status PENDING   Incomplete   Organism ID, Bacteria KLEBSIELLA PNEUMONIAE   Final   Organism ID, Bacteria PSEUDOMONAS AERUGINOSA   Final  CULTURE, BLOOD (ROUTINE X 2)     Status: None   Collection Time    04/26/13 11:35 AM      Result Value Ref Range Status    Specimen Description BLOOD LEFT HAND   Final   Special Requests BOTTLES DRAWN AEROBIC AND ANAEROBIC 5CC   Final   Culture  Setup Time     Final   Value: 04/26/2013 16:17     Performed at [facility]   Culture     Final   Value: STAPHYLOCOCCUS SPECIES (COAGULASE NEGATIVE)     Note: THE SIGNIFICANCE OF ISOLATING THIS ORGANISM FROM A SINGLE SET OF BLOOD CULTURES WHEN MULTIPLE SETS ARE DRAWN IS UNCERTAIN. PLEASE NOTIFY THE MICROBIOLOGY DEPARTMENT WITHIN ONE WEEK IF SPECIATION AND SENSITIVITIES ARE REQUIRED.     Note: Gram Stain Report Called to,Read Back By and Verified With: MATT YORK 04/28/13 1239A [facility]     Performed at [facility]   Report Status 04/29/2013 FINAL   Final  CULTURE, BLOOD (ROUTINE X 2)     Status: None   Collection Time    04/26/13 11:45 AM      Result Value Ref Range Status   Specimen Description BLOOD LEFT ANTECUBITAL   Final   Special Requests BOTTLES DRAWN AEROBIC AND ANAEROBIC 5CC   Final   Culture  Setup Time     Final   Value: 04/26/2013 16:17     Performed at [facility]  Culture     Final   Value:        BLOOD CULTURE RECEIVED NO GROWTH TO DATE CULTURE WILL BE HELD FOR 5 DAYS BEFORE ISSUING A FINAL NEGATIVE REPORT     Performed at [facility]   Report Status PENDING   Incomplete  CLOSTRIDIUM DIFFICILE BY PCR     Status: None   Collection Time    04/26/13  5:46 PM      Result Value Ref Range Status   C difficile by pcr NEGATIVE  NEGATIVE Final  MRSA PCR SCREENING     Status: None   Collection Time    04/26/13  6:28 PM      Result Value Ref Range Status   MRSA by PCR NEGATIVE  NEGATIVE Final   Comment:            The GeneXpert MRSA Assay (FDA     approved for NASAL specimens     only), is one component of a     comprehensive MRSA colonization     surveillance program. It is not     intended to diagnose MRSA     infection nor to guide or     monitor treatment for     MRSA infections.     Assessment: 68 yo M to  stop zosyn and start imipenem and ceftazidime for ESBL klebsiella and pseudomonas in 4/14 urine cx.  4/14 BC 1 of 2 BC staph coag negative.  [facility]   He has chronic stage IV sacral decubitus as well as B heel and ankle ulcers.  Area of possible osteomyelitis on 5th metatarsal. ESBL klebsiella sensitive to imipenem and pseudomonas sensitive to ceftazidime.  WBC 11.9. Afebrile.  Cefepime 4/14>>4/15 Zosyn 4/15>>4/17 vanc 4/14>> Imipenem 4/17>> Ceftazidime 4/17>>  Goal of Therapy:  erradication of infection  Plan:  1. Imipenem 250 mg IV q6h 2. Ceftazidime 1 gm IV q8h 3. Continue vancomycin 1 gm q24 4. DC vancomycin if 2nd BC remains negative 5. F/u renal function, wbc, temp. Herby AbrahamMichelle T. Zafira Munos, Pharm.D. [phone] 04/29/2013 10:38 AM

## 2013-04-29 NOTE — Progress Notes (Signed)
[name]  Craig [name] ZOX:[id number] DOB: 06-03-1945 DOA: 04/26/2013 PCP: No primary provider on file.  Time spent : 35 minutes  Brief narrative: 68 year old male patient resident of a skilled nursing facility who has never been hospitalized at [facility]. Was sent from the skilled nursing facility because of fever and hypoxia x 24 hours. Patient has a significant past medical history of failure to thrive, chronic stage IV sacral decubitus as well as bilateral heel and ankle ulcers. He required a diverting colostomy. He has had a PEG tube placed in the past and has a PICC line in place for chronic parenteral nutrition. He was recently admitted to the skilled nursing facility after spending 2 months at [facility] after being treated at [facility] for complications related to the decubitus and subsequent formation of a diverting colostomy.  He apparently presented to the nursing facility from the LTAC with plans to continue antibiotics for 2 weeks. Patient recently completed a vancomycin course 24 hours prior to development of these symptoms. In addition to the fever and hypoxemia he reportedly had developed hemoptysis. In the emergency department his room air saturations were 81%. He had a leukocytosis of 15,000 and had no output from his colostomy.  The electronic medical records from [facility] as well as [facility] have been reviewed in detail back to 2010. He has a significant history of poor social situation including homelessness that required living in shelters as well as being incarcerated in the county jail. 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 An October visit to the hospital documented poor living conditions and that the patient had been bed bound for several months but it was unclear if he had a decubitus at that time. He was discharged to a skilled nursing  facility.  He returned to the hospital in December of 2014 for influenza and healthcare acquired pneumonia and at that time a documented stage IV decubitus. He was later found to have Proteus pneumonia as well as E. Faecali bacteremia. CT scan done during that admission revealed sacral and coccygeal osteomyelitis but a three-phase bone scan done on 01/12/2013 demonstrated no evidence of osteomyelitis. Patient also has a history of atrial fibrillation but given his poor social situation he apparently was deemed an inappropriate candidate for long-term anticoagulation.  He underwent a diverting colostomy and apparent insertion of a gastric feeding tube in January 2015 and eventually was sent to [facility] and as noted was subsequently discharged to the skilled nursing facility.  It is unclear from the notes why TNA was being utilized in leu of the G tube for tube feeding.  Per the encounter in December 2014 at [facility] a sister is his legal guardian. A palliative evaluation was undertaken at that time and although she did not wish to change the patient's CODE STATUS from full resuscitation but she did wish to keep the patient comfortable.  HPI/Subjective: Patient much more alert today-says is hungry and says "I can walk."  Assessment/Plan:  Sepsis:   A) MDR Klebsiella and Psuedomonas UTI (lower urinary tract infection)   B) Severe Pressure ulcers of bilateral feet w/ extensive necrotic tissue  -Urine cx: MDR Klebsiella only sensitive to Imipenem - pseudomonas sensitive to [place]- anbx's adjusted -due to MDR organism contact isolation initiated -XR feet: Right foot without evidence of osteomyelitis but left foot x-ray has possible area of osteomyelitis  on fifth metatarsal - D/W Ortho Dr. Eulah PontMurphy and since pt not diabetic he deferred to [facility] -ABIs preliminary results: mild reduced flow on right and mild to moderate reduction on left -cont off load devices -wound care per WOC  RN -cont morphine for pain   Coag negative staph positive blood cx 1/2 blood cx's positive for coag neg staph- c/w skin contaminant -h.o bacteremia recently and had just completed rxn -dc Vanco  Acute hypoxic respiratory failure/COPD  -compensated without wheezing   Transaminitis /Hepatitis B -LFTs had increased slightly since admission and both AST and ALT greater than 200 with a normal total bilirubin-now trend is downward -Follow labs -[facility] demonstrated normal liver and biliary tree as well as normal pancreas and spleen and adrenal glands -Patient on Chronulac for presumed constipation prevention since no evidence of cirrhosis or hepatic encephalopathy in the past-have dc'd in favor of Colace  Unspecified hypothyroidism -cont Synthroid  Anemia -hgb appears stable around 7 -baseline 9.5 to 10.7 in Feb 2015 -check anemia panel  Sacral decubitus ulcer, stage IV -clean and granulating well -cont wound VAC and other care as outlined by WOC RN -OOB to chair-gelpad to chair  A-fib -currently maintaining NSR on Amiodarone  ?? Dysphagia -Records [facility] reveal that the patient was suspected of having dysphagia and an outpatient speech evaluation was recommended to be completed at the long-term care facility unfortunately I do not have documentation regarding this -SLP eval and MBSS confirm pt WITHOUT dysphagia so will begin diet- rec Regular but with poor dentition D3 -plain x-ray confirms tube in stomach - has been tolerating meds per tube without abdominal pain -dc PICC and cx tip and dc TNA -initiate nocturnal tube feed if oral intake not sufficient   Diverticulosis -No apparent abdominal pain at this time -Continue Colace to prevent constipation  Depression -Seems to be a long-standing issue -Continue Lexapro  GERD -Continue Prevacid  Profound deconditioning / FTT in adult /  Protein-calorie malnutrition, severe  -As above -Prealbumin 10.5 -begin enteral nutrition-dc PICC and TNA -PT/OT eval - pt says would like to walk    DVT prophylaxis: Lovenox Code Status: Full Family Communication: No family at bedside but per [facility] states patient's sister is his legal guardian Disposition Plan: Stepdown  Consultants: None  Procedures: Ankle brachial index   Antibiotics: Cefepime 4/14 >>> 4/15 Zosyn 4/15 >> 4/16 Vancomycin 4/14 >> 4/16 Ceftaz 4/17 >> Imipenem 4/17 >>  Objective: Blood pressure 98/40, pulse 82, temperature 97.9 F (36.6 C), temperature source Oral, resp. rate 13, height 5\' 8"  (1.727 m), weight 143 lb (64.864 kg), SpO2 97.00%.  Intake/Output Summary (Last 24 hours) at 04/29/13 1326 Last data filed at 04/29/13 1121  Gross per 24 hour  Intake   1729 ml  Output   2800 ml  Net  -1071 ml   Exam: General: No acute respiratory distress - appears chronically ill/cachectic  ENT; oral mucous membranes dry, poor dentition Lungs: Clear to auscultation bilaterally without wheezes or crackles, 2L Cardiovascular: Regular rate and rhythm without murmur gallop or rub normal S1 and S2, no peripheral edema or JVD Abdomen: Nontender, nondistended, soft, bowel sounds positive, no rebound, no ascites, no appreciable mass-colostomy with flatus and stool in bag, gastrostomy tube clamped with minor oozing around insertion site Musculoskeletal: No significant cyanosis, clubbing of bilateral lower extremities Neurological: Awake and more verbally responsive and appropriate today, generalized weakness without any  obvious focal neurological deficits, speech difficult to understand  Scheduled Meds:  Scheduled Meds: . [START ON 04/30/2013] amiodarone  200 mg Oral Daily  . antiseptic oral rinse  15 mL Mouth Rinse q12n4p  . [START ON 04/30/2013] aspirin  81 mg Oral Daily  . cefTAZidime (FORTAZ)  IV  1 g Intravenous 3 times per day  .  chlorhexidine  15 mL Mouth Rinse BID  . collagenase   Topical Daily  . docusate sodium  100 mg Oral BID  . enoxaparin (LOVENOX) injection  40 mg Subcutaneous Q24H  . [START ON 04/30/2013] escitalopram  10 mg Oral Daily  . feeding supplement (RESOURCE BREEZE)  1 Container Oral TID BM  . folic acid  1 mg Oral Daily  . imipenem-cilastatin  250 mg Intravenous 4 times per day  . [START ON 04/30/2013] lansoprazole  30 mg Oral Daily  . [START ON 04/30/2013] levothyroxine  150 mcg Oral QAC breakfast  . mometasone-formoterol  2 puff Inhalation BID  . morphine CONCENTRATE  5 mg Oral Q6H  . phosphorus  250 mg Oral BID  . sodium chloride  3 mL Intravenous Q12H  . thiamine  100 mg Oral Daily  . vancomycin  1,000 mg Intravenous Q24H  . vitamin C  500 mg Oral Daily  . zinc sulfate  220 mg Oral Daily   Data Reviewed: Basic Metabolic Panel:  Recent Labs Lab 04/26/13 1135 04/27/13 0500 04/28/13 0430 04/29/13 0740  NA 140 139 136* 136*  K 4.9 3.9 3.9 4.2  CL 100 100 97 100  CO2 27 26 27 25   GLUCOSE 121* 102* 113* 102*  BUN 42* 38* 28* 31*  CREATININE 1.09 1.11 1.15 1.12  CALCIUM 10.3 9.5 9.1 9.2  MG  --  1.7 1.6 1.9  PHOS  --  4.5 4.5  --    Liver Function Tests:  Recent Labs Lab 04/26/13 1135 04/27/13 0500 04/28/13 0430  AST 131* 217* 160*  ALT 119* 207* 186*  ALKPHOS 90 79 76  BILITOT 0.2* 0.3 <0.2*  PROT 8.7* 7.3 6.6  ALBUMIN 2.2* 1.9* 1.7*   CBC:  Recent Labs Lab 04/26/13 1135 04/27/13 0500 04/28/13 0430 04/29/13 0740  WBC 16.9* 14.5* 11.5* 11.9*  NEUTROABS 12.0* 9.2*  --   --   HGB 9.8* 7.9* 7.2* 7.5*  HCT 30.5* 25.1* 22.7* 23.8*  MCV 78.8 78.9 77.2* 77.5*  PLT 284 214 219 258    CBG:  Recent Labs Lab 04/28/13 0600 04/28/13 1107 04/28/13 1659 04/28/13 2351 04/29/13 0559  GLUCAP 117* 121* 119* 142* 103*    Recent Results (from the past 240 hour(s))  URINE CULTURE     Status: None   Collection Time    04/26/13 11:20 AM      Result Value Ref Range  Status   Specimen Description URINE, CATHETERIZED   Final   Special Requests Normal   Final   Culture  Setup Time     Final   Value: 04/26/2013 12:09     Performed at [facility] Count     Final   Value: >=100,000 COLONIES/ML     Performed at [facility]   Culture     Final   Value: KLEBSIELLA PNEUMONIAE     Note: Confirmed Extended Spectrum Beta-Lactamase Producer (ESBL) CRITICAL RESULT CALLED TO, READ BACK BY AND VERIFIED WITH: IRBY AY 5:48 A.M. ON 04/29/13 WARRB     PSEUDOMONAS AERUGINOSA     Performed at [facility]   Report Status PENDING   Incomplete   Organism ID, Bacteria KLEBSIELLA PNEUMONIAE   Final   Organism ID, Bacteria PSEUDOMONAS AERUGINOSA   Final  CULTURE, BLOOD (ROUTINE X 2)     Status: None   Collection Time    04/26/13 11:35 AM      Result Value Ref Range Status   Specimen Description BLOOD LEFT HAND   Final   Special Requests BOTTLES DRAWN AEROBIC AND ANAEROBIC 5CC   Final   Culture  Setup Time     Final   Value: 04/26/2013 16:17     Performed at [facility]   Culture     Final   Value: STAPHYLOCOCCUS SPECIES (COAGULASE NEGATIVE)     Note: THE SIGNIFICANCE OF ISOLATING THIS ORGANISM FROM A SINGLE SET OF BLOOD CULTURES WHEN MULTIPLE SETS ARE DRAWN IS UNCERTAIN. PLEASE NOTIFY THE MICROBIOLOGY DEPARTMENT WITHIN ONE WEEK IF SPECIATION AND SENSITIVITIES ARE REQUIRED.     Note: Gram Stain Report Called to,Read Back By and Verified With: MATT YORK 04/28/13 1239A [facility]     Performed at [facility]   Report Status 04/29/2013 FINAL   Final  CULTURE, BLOOD (ROUTINE X 2)     Status: None   Collection Time    04/26/13 11:45 AM      Result Value Ref Range Status   Specimen Description BLOOD LEFT ANTECUBITAL   Final   Special Requests BOTTLES DRAWN AEROBIC AND ANAEROBIC 5CC   Final   Culture  Setup Time     Final   Value: 04/26/2013 16:17     Performed at [facility]   Culture     Final   Value:        BLOOD  CULTURE RECEIVED NO GROWTH TO DATE CULTURE WILL BE HELD FOR 5 DAYS BEFORE ISSUING A FINAL NEGATIVE REPORT     Performed at [facility]   Report Status PENDING   Incomplete  CLOSTRIDIUM DIFFICILE BY PCR     Status: None   Collection Time    04/26/13  5:46 PM      Result Value Ref Range Status   C difficile by pcr NEGATIVE  NEGATIVE Final  MRSA PCR SCREENING     Status: None   Collection Time    04/26/13  6:28 PM      Result Value Ref Range Status   MRSA by PCR NEGATIVE  NEGATIVE Final   Comment:            The GeneXpert MRSA Assay (FDA     approved for NASAL specimens     only), is one component of a     comprehensive MRSA colonization     surveillance program. It is not     intended to diagnose MRSA     infection nor to guide or     monitor treatment for     MRSA infections.     Studies:  Recent x-ray studies have been reviewed in detail by the Attending Physician       Junious Silk, ANP [facility] Office  [phone] Pager [phone]  **If unable to reach the above provider after paging please contact the Flow Manager @ [phone]  On-Call/Text Page:      [email or web address]      password [email or web address]  If 7PM-7AM, please contact night-coverage [email or web address] Password [email or web address] 04/29/2013, 1:26 PM   LOS: 3 days   I have personally examined this patient and reviewed the entire database. I  have reviewed the above note, made any necessary editorial changes, and agree with its content.  Lonia BloodJeffrey T. McClung, MD [facility]

## 2013-04-30 LAB — RETICULOCYTES
RBC.: 3.03 MIL/uL — ABNORMAL LOW (ref 4.22–5.81)
RETIC COUNT ABSOLUTE: 36.4 10*3/uL (ref 19.0–186.0)
Retic Ct Pct: 1.2 % (ref 0.4–3.1)

## 2013-04-30 LAB — FERRITIN: Ferritin: 380 ng/mL — ABNORMAL HIGH (ref 22–322)

## 2013-04-30 LAB — URINE CULTURE: Special Requests: NORMAL

## 2013-04-30 LAB — IRON AND TIBC: UIBC: 162 ug/dL (ref 125–400)

## 2013-04-30 LAB — CBC
HCT: 23.2 % — ABNORMAL LOW (ref 39.0–52.0)
HEMOGLOBIN: 7.5 g/dL — AB (ref 13.0–17.0)
MCH: 24.8 pg — ABNORMAL LOW (ref 26.0–34.0)
MCHC: 32.3 g/dL (ref 30.0–36.0)
MCV: 76.6 fL — ABNORMAL LOW (ref 78.0–100.0)
Platelets: 249 10*3/uL (ref 150–400)
RBC: 3.03 MIL/uL — ABNORMAL LOW (ref 4.22–5.81)
RDW: 19.5 % — ABNORMAL HIGH (ref 11.5–15.5)
WBC: 15.8 10*3/uL — ABNORMAL HIGH (ref 4.0–10.5)

## 2013-04-30 LAB — BASIC METABOLIC PANEL
BUN: 25 mg/dL — ABNORMAL HIGH (ref 6–23)
CO2: 25 mEq/L (ref 19–32)
Calcium: 9.2 mg/dL (ref 8.4–10.5)
Chloride: 101 mEq/L (ref 96–112)
Creatinine, Ser: 1.11 mg/dL (ref 0.50–1.35)
GFR calc non Af Amer: 67 mL/min — ABNORMAL LOW (ref >90)
GFR, EST AFRICAN AMERICAN: 78 mL/min — AB (ref >90)
GLUCOSE: 70 mg/dL (ref 70–99)
Potassium: 4.2 mEq/L (ref 3.7–5.3)
Sodium: 137 mEq/L (ref 137–147)

## 2013-04-30 LAB — FOLATE

## 2013-04-30 LAB — VITAMIN B12: Vitamin B-12: 1085 pg/mL — ABNORMAL HIGH (ref 211–911)

## 2013-04-30 NOTE — Progress Notes (Signed)
eLink Physician-Brief Progress Note Patient Name: Craig GuerinWilliam [name] DOB: 01/25/1945 MRN: [id number]  Date of Service  04/30/2013   HPI/Events of Note   Urine culture results noted. - Klebsiella with extensive resistance. Primary team aware per note 4/18. On Ceftazidine.    eICU Interventions   Defer antibiotic choice to primary team. Would suggest low threshold to broaden gram negative coverage if decompensates.    Intervention Category Intermediate Interventions: Infection - evaluation and management  Jenelle MagesAdam R. Laquinta Hazell 04/30/2013, 3:58 PM

## 2013-04-30 NOTE — Progress Notes (Signed)
Received transfer patient to 5w08 at this time,  Denies pain/nausea, no family present, oriented pt to room and surroundings.

## 2013-04-30 NOTE — Progress Notes (Signed)
[name]  Craig GuerinWilliam [name] EAV:[id number] DOB: 01/17/1945 DOA: 04/26/2013 PCP: No primary provider on file.  Time spent : 35 minutes  Brief narrative: 68 year old male patient resident of a skilled nursing facility who [facility]. Was sent from the skilled nursing facility because of fever and hypoxia x 24 hours. Patient has a significant past medical history of failure to thrive, chronic stage IV sacral decubitus as well as bilateral heel and ankle ulcers. He required a diverting colostomy. He has had a PEG tube placed in the past and has a PICC line in place for chronic parenteral nutrition. He was recently admitted to the skilled nursing facility after [facility] for complications related to the decubitus and subsequent formation of a diverting colostomy.  He apparently presented to the nursing facility from the LTAC with plans to continue antibiotics for 2 weeks. Patient recently completed a vancomycin course 24 hours prior to development of these symptoms. In addition to the fever and hypoxemia he reportedly had developed hemoptysis. In the emergency department his room air saturations were 81%. He had a leukocytosis of 15,000 and had no output from his colostomy.  The [facility] medical [facility] as well as [facility] have been reviewed in detail back to 2010. He has a significant history of poor social situation including homelessness that required living in shelters as well as being incarcerated [place] the county jail. 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 An October visit to the hospital documented poor living conditions and that the patient had been bed bound for several months but it was unclear if he had a decubitus at that time. He was discharged to a skilled nursing  facility.  He returned to the hospital in December of 2014 for influenza and healthcare acquired pneumonia and at that time a documented stage IV decubitus. He was later found to have Proteus pneumonia as well as E. Faecali bacteremia. CT scan done during that admission revealed sacral and coccygeal osteomyelitis but a three-phase bone scan done on 01/12/2013 demonstrated no evidence of osteomyelitis. Patient also has a history of atrial fibrillation but given his poor social situation he apparently was deemed an inappropriate candidate for long-term anticoagulation.  He underwent a diverting colostomy and apparent insertion of a gastric feeding tube in January 2015 and eventually was [facility] and as noted was subsequently discharged to the skilled nursing facility.  It is unclear from the notes why TNA was being utilized in leu of the G tube for tube feeding.  Per the encounter in December 2014 at [facility] a sister is his legal guardian. A palliative evaluation was undertaken at that time and although she did not wish to change the patient's CODE STATUS from full resuscitation but she did wish to keep the patient comfortable.  HPI/Subjective: Patient is alert conversant.  Denies pain at the present time.  No nausea or vomiting.  No sob.    Assessment/Plan:  Sepsis due to multiple sites of infection:   A) MDR Klebsiella and Psuedomonas UTI (lower urinary tract infection)   B) Severe Pressure ulcers of bilateral feet w/ extensive necrotic tissue  -Urine cx: MDR Klebsiella only sensitive to Imipenem - pseudomonas sensitive to [place]- anbx's adjusted after discussion w/ Pharmacy  -XR feet: Right foot  without evidence of osteomyelitis but left foot x-ray has possible area of osteomyelitis on fifth metatarsal - D/W Ortho Dr. Eulah PontMurphy and since pt not diabetic he deferred any operative evaluation to VVS -ABIs preliminary results: mild reduced flow on right and mild to  moderate reduction on left suggesting blood supply adequate  -cont off load devices and local wound care per WOC RN at this time - if no improvement/progress made will need to consider VVS consultation  -cont morphine for pain  Coag negative staph positive blood cx -1/2 blood cx's positive for coag neg staph- c/w skin contaminant -h/o bacteremia recently and had just completed rxn -dc Vanco and follow clinically   Chronic COPD  -compensated at present without wheezing   Transaminitis / Hepatitis B -LFTs had increased slightly since admission - now trend is downward -Follow labs -[facility] demonstrated normal liver and biliary tree as well as normal pancreas and spleen and adrenal glands -Patient on Chronulac for presumed constipation prevention since no evidence of cirrhosis or hepatic encephalopathy in the past-have dc'd in favor of Colace  Unspecified hypothyroidism -cont Synthroid - check TSH in AM  Anemia -hgb appears stable around 7.5 -baseline 9.5 to 10.7 in Feb 2015 -B12 and folate normal - Fe studies pending   Sacral decubitus ulcer, stage IV -clean and granulating well -cont wound VAC and other care as outlined by WOC RN -OOB to chair - gelpad to chair  A-fib -currently maintaining NSR on Amiodarone  ?Dysphagia -Records from December [facility] reveal that the patient was suspected of having dysphagia and an outpatient speech evaluation was recommended to be completed at the long-term care facility - unfortunately I do not have documentation regarding this -SLP eval and MBSS here confirm pt WITHOUT dysphagia so will begin diet -plain x-ray confirms tube in stomach - has been tolerating meds per tube without abdominal pain -dc PICC and cx tip and dc TNA -initiate nocturnal tube feed if oral intake not sufficient   Diverticulosis -No apparent abdominal pain at this time -Continue Colace to prevent  constipation  Depression -Seems to be a long-standing issue -Continue Lexapro  GERD -Continue Prevacid  Profound deconditioning / FTT in adult / Protein-calorie malnutrition, severe  -As above -Prealbumin 10.5 -begin enteral nutrition - dc PICC and TNA -PT/OT eval - pt says would like to walk    DVT prophylaxis: Lovenox Code Status: Full Family Communication: No family at bedside but per previous documentation at [facility] states patient's sister is his legal guardian Disposition Plan: Stable for transfer to med bed - cont to push oral nutrition - re-attempt PT/OT next week  Consultants: None  Procedures: ABIs - noted above   Antibiotics: Cefepime 4/14  Zosyn 4/15 >> 4/16 Vancomycin 4/14 >> 4/16 Ceftaz 4/17 >> Imipenem 4/17 Fosfomycin 4/17 >>  Objective: Blood pressure 123/44, pulse 88, temperature 98 F (36.7 C), temperature source Oral, resp. rate 20, height 5\' 8"  (1.727 m), weight 64.864 kg (143 lb), SpO2 96.00%.  Intake/Output Summary (Last 24 hours) at 04/30/13 1527 Last data filed at 04/30/13 1400  Gross per 24 hour  Intake 2058.33 ml  Output   1775 ml  Net 283.33 ml   Exam: General: No acute respiratory distress - cachectic  ENT; poor dentition Lungs: Clear to auscultation bilaterally without wheezes or crackles Cardiovascular: Regular rate and rhythm without murmur gallop or rub, no peripheral edema  Abdomen: Nontender, nondistended, soft, bowel sounds positive, no  rebound, no ascites, no appreciable mass - colostomy with flatus and stool in bag - gastrostomy tube clamped with minor oozing around insertion site Musculoskeletal: No significant cyanosis, clubbing of bilateral lower extremities Neurological: Awake and verbally responsive, generalized weakness without any obvious focal neurological deficits  Scheduled Meds:  Scheduled Meds: . amiodarone  200 mg Oral Daily  . antiseptic oral rinse  15 mL Mouth Rinse q12n4p  . aspirin  81 mg Oral  Daily  . cefTAZidime (FORTAZ)  IV  1 g Intravenous 3 times per day  . chlorhexidine  15 mL Mouth Rinse BID  . collagenase   Topical Daily  . docusate sodium  100 mg Oral BID  . enoxaparin (LOVENOX) injection  40 mg Subcutaneous Q24H  . escitalopram  10 mg Oral Daily  . feeding supplement (RESOURCE BREEZE)  1 Container Oral TID BM  . folic acid  1 mg Oral Daily  . fosfomycin  3 g Oral QODAY  . lansoprazole  30 mg Oral Daily  . levothyroxine  150 mcg Oral QAC breakfast  . mometasone-formoterol  2 puff Inhalation BID  . morphine CONCENTRATE  5 mg Oral Q6H  . phosphorus  250 mg Oral BID  . sodium chloride  3 mL Intravenous Q12H  . thiamine  100 mg Oral Daily  . vitamin C  500 mg Oral Daily  . zinc sulfate  220 mg Oral Daily   Data Reviewed: Basic Metabolic Panel:  Recent Labs Lab 04/26/13 1135 04/27/13 0500 04/28/13 0430 04/29/13 0740 04/30/13 0437  NA 140 139 136* 136* 137  K 4.9 3.9 3.9 4.2 4.2  CL 100 100 97 100 101  CO2 27 26 27 25 25   GLUCOSE 121* 102* 113* 102* 70  BUN 42* 38* 28* 31* 25*  CREATININE 1.09 1.11 1.15 1.12 1.11  CALCIUM 10.3 9.5 9.1 9.2 9.2  MG  --  1.7 1.6 1.9  --   PHOS  --  4.5 4.5  --   --    Liver Function Tests:  Recent Labs Lab 04/26/13 1135 04/27/13 0500 04/28/13 0430  AST 131* 217* 160*  ALT 119* 207* 186*  ALKPHOS 90 79 76  BILITOT 0.2* 0.3 <0.2*  PROT 8.7* 7.3 6.6  ALBUMIN 2.2* 1.9* 1.7*   CBC:  Recent Labs Lab 04/26/13 1135 04/27/13 0500 04/28/13 0430 04/29/13 0740 04/30/13 0437  WBC 16.9* 14.5* 11.5* 11.9* 15.8*  NEUTROABS 12.0* 9.2*  --   --   --   HGB 9.8* 7.9* 7.2* 7.5* 7.5*  HCT 30.5* 25.1* 22.7* 23.8* 23.2*  MCV 78.8 78.9 77.2* 77.5* 76.6*  PLT 284 214 219 258 249    CBG:  Recent Labs Lab 04/28/13 0600 04/28/13 1107 04/28/13 1659 04/28/13 2351 04/29/13 0559  GLUCAP 117* 121* 119* 142* 103*    Recent Results (from the past 240 hour(s))  URINE CULTURE     Status: None   Collection Time    04/26/13  11:20 AM      Result Value Ref Range Status   Specimen Description URINE, CATHETERIZED   Final   Special Requests Normal   Final   Culture  Setup Time     Final   Value: 04/26/2013 12:09     Performed at [facility] Count     Final   Value: >=100,000 COLONIES/ML     Performed at [facility]   Culture     Final   Value: KLEBSIELLA PNEUMONIAE  Note: Confirmed Extended Spectrum Beta-Lactamase Producer (ESBL) CRITICAL RESULT CALLED TO, READ BACK BY AND VERIFIED WITH: IRBY AY 5:48 A.M. ON 04/29/13 WARRB MEROPENEM=SENSITIVE <=0.25     PSEUDOMONAS AERUGINOSA     Performed at [facility]   Report Status 04/30/2013 FINAL   Final   Organism ID, Bacteria KLEBSIELLA PNEUMONIAE   Final   Organism ID, Bacteria PSEUDOMONAS AERUGINOSA   Final  CULTURE, BLOOD (ROUTINE X 2)     Status: None   Collection Time    04/26/13 11:35 AM      Result Value Ref Range Status   Specimen Description BLOOD LEFT HAND   Final   Special Requests BOTTLES DRAWN AEROBIC AND ANAEROBIC 5CC   Final   Culture  Setup Time     Final   Value: 04/26/2013 16:17     Performed at [facility]   Culture     Final   Value: STAPHYLOCOCCUS SPECIES (COAGULASE NEGATIVE)     Note: THE SIGNIFICANCE OF ISOLATING THIS ORGANISM FROM A SINGLE SET OF BLOOD CULTURES WHEN MULTIPLE SETS ARE DRAWN IS UNCERTAIN. PLEASE NOTIFY THE MICROBIOLOGY DEPARTMENT WITHIN ONE WEEK IF SPECIATION AND SENSITIVITIES ARE REQUIRED.     Note: Gram Stain Report Called to,Read Back By and Verified With: MATT YORK 04/28/13 1239A [facility]     Performed at [facility]   Report Status 04/29/2013 FINAL   Final  CULTURE, BLOOD (ROUTINE X 2)     Status: None   Collection Time    04/26/13 11:45 AM      Result Value Ref Range Status   Specimen Description BLOOD LEFT ANTECUBITAL   Final   Special Requests BOTTLES DRAWN AEROBIC AND ANAEROBIC 5CC   Final   Culture  Setup Time     Final   Value: 04/26/2013 16:17     Performed  at [facility]   Culture     Final   Value:        BLOOD CULTURE RECEIVED NO GROWTH TO DATE CULTURE WILL BE HELD FOR 5 DAYS BEFORE ISSUING A FINAL NEGATIVE REPORT     Performed at [facility]   Report Status PENDING   Incomplete  CLOSTRIDIUM DIFFICILE BY PCR     Status: None   Collection Time    04/26/13  5:46 PM      Result Value Ref Range Status   C difficile by pcr NEGATIVE  NEGATIVE Final  MRSA PCR SCREENING     Status: None   Collection Time    04/26/13  6:28 PM      Result Value Ref Range Status   MRSA by PCR NEGATIVE  NEGATIVE Final   Comment:            The GeneXpert MRSA Assay (FDA     approved for NASAL specimens     only), is one component of a     comprehensive MRSA colonization     surveillance program. It is not     intended to diagnose MRSA     infection nor to guide or     monitor treatment for     MRSA infections.     Studies:  Recent x-ray studies have been reviewed in detail by the Attending Physician  Lonia Blood, MD [facility] For Consults/Admissions - Flow Manager - [phone] Office  [phone] Pager [phone]  On-Call/Text Page:      [email or web address]      password [email or web address]   If 7PM-7AM,  please contact night-coverage [email or web address] Password [email or web address] 04/30/2013, 3:27 PM   LOS: 4 days

## 2013-05-01 DIAGNOSIS — E43 Unspecified severe protein-calorie malnutrition: Secondary | ICD-10-CM

## 2013-05-01 DIAGNOSIS — D649 Anemia, unspecified: Secondary | ICD-10-CM

## 2013-05-01 DIAGNOSIS — A419 Sepsis, unspecified organism: Principal | ICD-10-CM

## 2013-05-01 LAB — HEPATITIS PANEL, ACUTE
HCV AB: NEGATIVE
HEP A IGM: NONREACTIVE
Hep B C IgM: NONREACTIVE
Hepatitis B Surface Ag: NEGATIVE

## 2013-05-01 LAB — COMPREHENSIVE METABOLIC PANEL
ALBUMIN: 1.9 g/dL — AB (ref 3.5–5.2)
ALK PHOS: 89 U/L (ref 39–117)
ALT: 134 U/L — AB (ref 0–53)
AST: 86 U/L — AB (ref 0–37)
BUN: 23 mg/dL (ref 6–23)
CHLORIDE: 98 meq/L (ref 96–112)
CO2: 24 mEq/L (ref 19–32)
Calcium: 9.5 mg/dL (ref 8.4–10.5)
Creatinine, Ser: 1.15 mg/dL (ref 0.50–1.35)
GFR calc Af Amer: 75 mL/min — ABNORMAL LOW (ref >90)
GFR calc non Af Amer: 65 mL/min — ABNORMAL LOW (ref >90)
Glucose, Bld: 56 mg/dL — ABNORMAL LOW (ref 70–99)
POTASSIUM: 4.4 meq/L (ref 3.7–5.3)
SODIUM: 136 meq/L — AB (ref 137–147)
Total Bilirubin: 0.3 mg/dL (ref 0.3–1.2)
Total Protein: 8 g/dL (ref 6.0–8.3)

## 2013-05-01 LAB — CBC
HCT: 25.2 % — ABNORMAL LOW (ref 39.0–52.0)
Hemoglobin: 8.1 g/dL — ABNORMAL LOW (ref 13.0–17.0)
MCH: 24.6 pg — AB (ref 26.0–34.0)
MCHC: 32.1 g/dL (ref 30.0–36.0)
MCV: 76.6 fL — AB (ref 78.0–100.0)
Platelets: 302 10*3/uL (ref 150–400)
RBC: 3.29 MIL/uL — ABNORMAL LOW (ref 4.22–5.81)
RDW: 19.2 % — AB (ref 11.5–15.5)
WBC: 15.7 10*3/uL — ABNORMAL HIGH (ref 4.0–10.5)

## 2013-05-01 LAB — TSH: TSH: 0.15 u[IU]/mL — AB (ref 0.350–4.500)

## 2013-05-01 LAB — PHOSPHORUS: Phosphorus: 3.6 mg/dL (ref 2.3–4.6)

## 2013-05-01 LAB — HIV ANTIBODY (ROUTINE TESTING W REFLEX): HIV: NONREACTIVE

## 2013-05-01 MED ORDER — SODIUM CHLORIDE 0.9 % IV SOLN
250.0000 mg | Freq: Four times a day (QID) | INTRAVENOUS | Status: DC
  Administered 2013-05-01 – 2013-05-03 (×9): 250 mg via INTRAVENOUS
  Filled 2013-05-01 (×13): qty 250

## 2013-05-01 NOTE — Progress Notes (Signed)
ANTIBIOTIC CONSULT NOTE - INITIAL  Pharmacy Consult for Primaxin Indication: ESBL +  No Known Allergies  Patient Measurements: Height: 5\' 8"  (172.7 cm) Weight: 143 lb (64.864 kg) IBW/kg (Calculated) : 68.4 Adjusted Body Weight:    Vital Signs: Temp: 97.9 F (36.6 C) (04/19 0649) Temp src: Oral (04/19 0649) BP: 135/68 mmHg (04/19 0649) Pulse Rate: 90 (04/19 0649) Intake/Output from previous day: 04/18 0701 - 04/19 0700 In: 955 [P.O.:480; I.V.:425; IV Piggyback:50] Out: 1800 [Urine:1750; Drains:50] Intake/Output from this shift:    Labs:  Recent Labs  04/29/13 0740 04/30/13 0437 05/01/13 0715  WBC 11.9* 15.8* 15.7*  HGB 7.5* 7.5* 8.1*  PLT 258 249 302  CREATININE 1.12 1.11 1.15   Estimated Creatinine Clearance: 58 ml/min (by C-G formula based on Cr of 1.15). No results found for this basename: VANCOTROUGH, Leodis BinetVANCOPEAK, VANCORANDOM, GENTTROUGH, GENTPEAK, GENTRANDOM, TOBRATROUGH, TOBRAPEAK, TOBRARND, AMIKACINPEAK, AMIKACINTROU, AMIKACIN,  in the last 72 hours   Microbiology: Recent Results (from the past 720 hour(s))  URINE CULTURE     Status: None   Collection Time    04/26/13 11:20 AM      Result Value Ref Range Status   Specimen Description URINE, CATHETERIZED   Final   Special Requests Normal   Final   Culture  Setup Time     Final   Value: 04/26/2013 12:09     Performed at [facility]   Colony Count     Final   Value: >=100,000 COLONIES/ML     Performed at [facility]   Culture     Final   Value: KLEBSIELLA PNEUMONIAE     Note: Confirmed Extended Spectrum Beta-Lactamase Producer (ESBL) CRITICAL RESULT CALLED TO, READ BACK BY AND VERIFIED WITH: IRBY AY 5:48 A.M. ON 04/29/13 WARRB MEROPENEM=SENSITIVE <=0.25     PSEUDOMONAS AERUGINOSA     Performed at [facility]   Report Status 04/30/2013 FINAL   Final   Organism ID, Bacteria KLEBSIELLA PNEUMONIAE   Final   Organism ID, Bacteria PSEUDOMONAS AERUGINOSA   Final  CULTURE, BLOOD  (ROUTINE X 2)     Status: None   Collection Time    04/26/13 11:35 AM      Result Value Ref Range Status   Specimen Description BLOOD LEFT HAND   Final   Special Requests BOTTLES DRAWN AEROBIC AND ANAEROBIC 5CC   Final   Culture  Setup Time     Final   Value: 04/26/2013 16:17     Performed at [facility]   Culture     Final   Value: STAPHYLOCOCCUS SPECIES (COAGULASE NEGATIVE)     Note: THE SIGNIFICANCE OF ISOLATING THIS ORGANISM FROM A SINGLE SET OF BLOOD CULTURES WHEN MULTIPLE SETS ARE DRAWN IS UNCERTAIN. PLEASE NOTIFY THE MICROBIOLOGY DEPARTMENT WITHIN ONE WEEK IF SPECIATION AND SENSITIVITIES ARE REQUIRED.     Note: Gram Stain Report Called to,Read Back By and Verified With: MATT YORK 04/28/13 1239A [facility]     Performed at [facility]   Report Status 04/29/2013 FINAL   Final  CULTURE, BLOOD (ROUTINE X 2)     Status: None   Collection Time    04/26/13 11:45 AM      Result Value Ref Range Status   Specimen Description BLOOD LEFT ANTECUBITAL   Final   Special Requests BOTTLES DRAWN AEROBIC AND ANAEROBIC 5CC   Final   Culture  Setup Time     Final   Value: 04/26/2013 16:17     [facility]formed  at [facility]   Culture     Final   Value:        BLOOD CULTURE RECEIVED NO GROWTH TO DATE CULTURE WILL BE HELD FOR 5 DAYS BEFORE ISSUING A FINAL NEGATIVE REPORT     Performed at [facility]   Report Status PENDING   Incomplete  CLOSTRIDIUM DIFFICILE BY PCR     Status: None   Collection Time    04/26/13  5:46 PM      Result Value Ref Range Status   C difficile by pcr NEGATIVE  NEGATIVE Final  MRSA PCR SCREENING     Status: None   Collection Time    04/26/13  6:28 PM      Result Value Ref Range Status   MRSA by PCR NEGATIVE  NEGATIVE Final   Comment:            The GeneXpert MRSA Assay (FDA     approved for NASAL specimens     only), is one component of a     comprehensive MRSA colonization     surveillance program. It is not     intended to diagnose MRSA      infection nor to guide or     monitor treatment for     MRSA infections.    Medical History: Past Medical History  Diagnosis Date  . COPD (chronic obstructive pulmonary disease)   . Hypertension   . Myocardial infarction   . A-fib   . GERD (gastroesophageal reflux disease)    Assessment: Admit Complaint: 68 y.[name] coughing up blood-tinged sputum. Pt with h/o recurrent PNA - rec lvq and then Vanc for 2 wks. Finished antibiotics 4/13 per NH MD note.   AC - Enox 40 for VTE px  ID:  UTI/Sepsis. ESBL klebsiella and pseudomonas in 4/14 urine cx. Also has multiple decubitus ulcers in sacral area B heels and ankles.. WBC up to 15.8, afb. PICC out 4/17  Elita QuickFortaz 4/17>> Imi 4/17>>4/17 (got 2 doses), 4/19>> Fosfo 4/17 x 1 Vanc 4/14>>4/17 --VT at goal = 19.0 on 4/14 Cefepime 4/14>>4/15 Zosyn 4/15>>4/17  4/14 - urine -ESBL Klebsiella S to imipenem; pseudomonas S to ceftaz 4/14 - blood x 2 - 1 of 2 staph coag neg - final, thought to be contaminant. 4/14 MRSA PCR neg 4/14 C diff neg   Plan:  Fortaz 1g IV q8hrs. Primaxin 250mg  IV q6hrs.   Cleotis Sparr S. Merilynn Finlandobertson, PharmD, [facility] Clinical Staff Pharmacist Pager [phone]  [facility]illing[facility] Merilynn Finlandobertson 05/01/2013,10:38 AM

## 2013-05-01 NOTE — Progress Notes (Signed)
Progress Note  Craig [name] ZOX:[id number] DOB: [birth year] DOA: 04/26/2013 PCP: No primary provider on file.  Time spent : 35 minutes  Brief narrative: 68 year old male patient resident of a skilled nursing facility who has never been hospitalized at [facility]. Was sent from the skilled nursing facility because of fever and hypoxia x 24 hours. Patient has a significant past medical history of failure to thrive, chronic stage IV sacral decubitus as well as bilateral heel and ankle ulcers. He required a diverting colostomy. He has had a PEG tube placed in the past and has a PICC line in place for chronic parenteral nutrition. He was recently admitted to the skilled nursing facility after spending 2 months at [facility] after being treated at [facility] for complications related to the decubitus and subsequent formation of a diverting colostomy.  He apparently presented to the nursing facility from the LTAC with plans to continue antibiotics for 2 weeks. Patient recently completed a vancomycin course 24 hours prior to development of these symptoms. In addition to the fever and hypoxemia he reportedly had developed hemoptysis. In the emergency department his room air saturations were 81%. He had a leukocytosis of 15,000 and had no output from his colostomy.  The electronic medical records from [facility] as well as [facility] have been reviewed in detail back to 2010. He has a significant history of poor social situation including homelessness that required living in shelters as well as being [place] the [place] jail. 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 An October visit to the hospital documented poor living conditions and that the patient had been bed bound for several months but it was unclear if he had a decubitus at that time. He was discharged to a skilled nursing facility.  He returned to the  hospital in December of 2014 for influenza and healthcare acquired pneumonia and at that time a documented stage IV decubitus. He was later found to have Proteus pneumonia as well as E. Faecali bacteremia. CT scan done during that admission revealed sacral and coccygeal osteomyelitis but a three-phase bone scan done on 01/12/2013 demonstrated no evidence of osteomyelitis. Patient also has a history of atrial fibrillation but given his poor social situation he apparently was deemed an inappropriate candidate for long-term anticoagulation.  He underwent a diverting colostomy and apparent insertion of a gastric feeding tube in January 2015 and eventually was sent to [facility] and as noted was subsequently discharged to the skilled nursing facility.  It is unclear from the notes why TNA was being utilized in leu of the G tube for tube feeding.  Per the encounter in December 2014 at [facility] a sister is his legal guardian. A palliative evaluation was undertaken at that time and although she did not wish to change the patient's CODE STATUS from full resuscitation but she did wish to keep the patient comfortable.  HPI/Subjective: Patient is awake and alert, denies any complaints.  Assessment/Plan:  Sepsis -Sepsis is secondary to UTI and pressure ulcer. -Patient currently on ceftazidime and oral fosfomycin, we'll add Primaxin. -Blood culture showed coagulase-negative staph, likely contaminant. Blood culture repeated.  UTI -Secondary to ESBL Klebsiella and Pseudomonas. -The patient is on ceftazidime and oral fosfomycin. -Will switch fosfomycin to Primaxin.  Severe Pressure ulcers of bilateral feet w/ extensive necrotic tissue  -XR feet: Right foot without evidence of osteomyelitis but left foot x-ray has possible  area of osteomyelitis on fifth metatarsal - D/W Ortho Dr. Eulah PontMurphy and since pt not diabetic he deferred any operative evaluation to VVS -ABIs preliminary results: mild  reduced flow on right and mild to moderate reduction on left suggesting blood supply adequate  -cont off load devices and local wound care per WOC RN at this time - if no improvement/progress made will need to consider VVS consultation  -cont morphine for pain  Coag negative staph positive blood cx -1/2 blood cx's positive for coag neg staph- c/w skin contaminant -h/o bacteremia recently and had just completed rxn -dc Vanco and follow clinically   Chronic COPD  -compensated at present without wheezing   Transaminitis / Hepatitis B -LFTs had increased slightly since admission - now trend is downward -Follow labs -[facility] demonstrated normal liver and biliary tree as well as normal pancreas and spleen and adrenal glands -Patient on Chronulac for presumed constipation prevention since no evidence of cirrhosis or hepatic encephalopathy in the past-have dc'd in favor of Colace  Unspecified hypothyroidism -cont Synthroid - check TSH in AM  Anemia -hgb appears stable around 7.5 -baseline 9.5 to 10.7 in Feb 2015 -B12 and folate normal - Fe studies pending   Sacral decubitus ulcer, stage IV -clean and granulating well -cont wound VAC and other care as outlined by WOC RN -OOB to chair - gelpad to chair  A-fib -currently maintaining NSR on Amiodarone  ?Dysphagia -Records from December 2014 [facility] reveal that the patient was suspected of having dysphagia and an outpatient speech evaluation was recommended to be completed at the long-term care facility - unfortunately I do not have documentation regarding this -SLP eval and MBSS here confirm pt WITHOUT dysphagia so will begin diet -plain x-ray confirms tube in stomach - has been tolerating meds per tube without abdominal pain -dc PICC and cx tip and dc TNA -initiate nocturnal tube feed if oral intake not sufficient   Diverticulosis -No apparent abdominal pain at this  time -Continue Colace to prevent constipation  Depression -Seems to be a long-standing issue -Continue Lexapro  GERD -Continue Prevacid  Profound deconditioning / FTT in adult / Protein-calorie malnutrition, severe  -As above -Prealbumin 10.5 -begin enteral nutrition - dc PICC and TNA -PT/OT eval - pt says would like to walk    DVT prophylaxis: Lovenox Code Status: Full Family Communication: No family at bedside but per previous documentation at [facility] states patient's sister is his legal guardian Disposition Plan: Stable for transfer to med bed - cont to push oral nutrition - re-attempt PT/OT next week  Consultants: None  Procedures: ABIs - noted above   Antibiotics: Cefepime 4/14  Zosyn 4/15 >> 4/16 Vancomycin 4/14 >> 4/16 Ceftaz 4/17 >> Imipenem 4/17 Fosfomycin 4/17 >>  Objective: Blood pressure 135/68, pulse 90, temperature 97.9 F (36.6 C), temperature source Oral, resp. rate 16, height 5\' 8"  (1.727 m), weight 64.864 kg (143 lb), SpO2 94.00%.  Intake/Output Summary (Last 24 hours) at 05/01/13 1006 Last data filed at 05/01/13 0654  Gross per 24 hour  Intake    565 ml  Output   1600 ml  Net  -1035 ml   Exam: General: No acute respiratory distress - cachectic  ENT; poor dentition Lungs: Clear to auscultation bilaterally without wheezes or crackles Cardiovascular: Regular rate and rhythm without murmur gallop or rub, no peripheral edema  Abdomen: Nontender, nondistended, soft, bowel sounds positive, no rebound, no ascites, no appreciable mass -  colostomy with flatus and stool in bag - gastrostomy tube clamped with minor oozing around insertion site Musculoskeletal: No significant cyanosis, clubbing of bilateral lower extremities Neurological: Awake and verbally responsive, generalized weakness without any obvious focal neurological deficits  Scheduled Meds:  Scheduled Meds: . amiodarone  200 mg Oral Daily  . antiseptic oral rinse  15 mL Mouth Rinse  q12n4p  . aspirin  81 mg Oral Daily  . cefTAZidime (FORTAZ)  IV  1 g Intravenous 3 times per day  . chlorhexidine  15 mL Mouth Rinse BID  . collagenase   Topical Daily  . docusate sodium  100 mg Oral BID  . enoxaparin (LOVENOX) injection  40 mg Subcutaneous Q24H  . escitalopram  10 mg Oral Daily  . feeding supplement (RESOURCE BREEZE)  1 Container Oral TID BM  . folic acid  1 mg Oral Daily  . fosfomycin  3 g Oral QODAY  . lansoprazole  30 mg Oral Daily  . levothyroxine  150 mcg Oral QAC breakfast  . mometasone-formoterol  2 puff Inhalation BID  . morphine CONCENTRATE  5 mg Oral Q6H  . phosphorus  250 mg Oral BID  . sodium chloride  3 mL Intravenous Q12H  . thiamine  100 mg Oral Daily  . vitamin C  500 mg Oral Daily  . zinc sulfate  220 mg Oral Daily   Data Reviewed: Basic Metabolic Panel:  Recent Labs Lab 04/26/13 1135 04/27/13 0500 04/28/13 0430 04/29/13 0740 04/30/13 0437 05/01/13 0715  NA 140 139 136* 136* 137 136*  K 4.9 3.9 3.9 4.2 4.2 4.4  CL 100 100 97 100 101 98  CO2 27 26 27 25 25 24   GLUCOSE 121* 102* 113* 102* 70 56*  BUN 42* 38* 28* 31* 25* 23  CREATININE 1.09 1.11 1.15 1.12 1.11 1.15  CALCIUM 10.3 9.5 9.1 9.2 9.2 9.5  MG  --  1.7 1.6 1.9  --   --   PHOS  --  4.5 4.5  --   --  3.6   Liver Function Tests:  Recent Labs Lab 04/26/13 1135 04/27/13 0500 04/28/13 0430 05/01/13 0715  AST 131* 217* 160* 86*  ALT 119* 207* 186* 134*  ALKPHOS 90 79 76 89  BILITOT 0.2* 0.3 <0.2* 0.3  PROT 8.7* 7.3 6.6 8.0  ALBUMIN 2.2* 1.9* 1.7* 1.9*   CBC:  Recent Labs Lab 04/26/13 1135 04/27/13 0500 04/28/13 0430 04/29/13 0740 04/30/13 0437 05/01/13 0715  WBC 16.9* 14.5* 11.5* 11.9* 15.8* 15.7*  NEUTROABS 12.0* 9.2*  --   --   --   --   HGB 9.8* 7.9* 7.2* 7.5* 7.5* 8.1*  HCT 30.5* 25.1* 22.7* 23.8* 23.2* 25.2*  MCV 78.8 78.9 77.2* 77.5* 76.6* 76.6*  PLT 284 214 219 258 249 302    CBG:  Recent Labs Lab 04/28/13 0600 04/28/13 1107 04/28/13 1659  04/28/13 2351 04/29/13 0559  GLUCAP 117* 121* 119* 142* 103*    Recent Results (from the past 240 hour(s))  URINE CULTURE     Status: None   Collection Time    04/26/13 11:20 AM      Result Value Ref Range Status   Specimen Description URINE, CATHETERIZED   Final   Special Requests Normal   Final   Culture  Setup Time     Final   Value: 04/26/2013 12:09     Performed at [facility]   Colony Count     Final   Value: >=100,000 COLONIES/ML  Performed at [facility]     Final   Value: KLEBSIELLA PNEUMONIAE     Note: Confirmed Extended Spectrum Beta-Lactamase Producer (ESBL) CRITICAL RESULT CALLED TO, READ BACK BY AND VERIFIED WITH: IRBY AY 5:48 A.M. ON 04/29/13 WARRB MEROPENEM=SENSITIVE <=0.25     PSEUDOMONAS AERUGINOSA     Performed at [facility]   Report Status 04/30/2013 FINAL   Final   Organism ID, Bacteria KLEBSIELLA PNEUMONIAE   Final   Organism ID, Bacteria PSEUDOMONAS AERUGINOSA   Final  CULTURE, BLOOD (ROUTINE X 2)     Status: None   Collection Time    04/26/13 11:35 AM      Result Value Ref Range Status   Specimen Description BLOOD LEFT HAND   Final   Special Requests BOTTLES DRAWN AEROBIC AND ANAEROBIC 5CC   Final   Culture  Setup Time     Final   Value: 04/26/2013 16:17     Performed at [facility]   Culture     Final   Value: STAPHYLOCOCCUS SPECIES (COAGULASE NEGATIVE)     Note: THE SIGNIFICANCE OF ISOLATING THIS ORGANISM FROM A SINGLE SET OF BLOOD CULTURES WHEN MULTIPLE SETS ARE DRAWN IS UNCERTAIN. PLEASE NOTIFY THE MICROBIOLOGY DEPARTMENT WITHIN ONE WEEK IF SPECIATION AND SENSITIVITIES ARE REQUIRED.     Note: Gram Stain Report Called to,Read Back By and Verified With: MATT YORK 04/28/13 1239A [facility]     Performed at [facility]   Report Status 04/29/2013 FINAL   Final  CULTURE, BLOOD (ROUTINE X 2)     Status: None   Collection Time    04/26/13 11:45 AM      Result Value Ref Range Status   Specimen  Description BLOOD LEFT ANTECUBITAL   Final   Special Requests BOTTLES DRAWN AEROBIC AND ANAEROBIC 5CC   Final   Culture  Setup Time     Final   Value: 04/26/2013 16:17     Performed at [facility]   Culture     Final   Value:        BLOOD CULTURE RECEIVED NO GROWTH TO DATE CULTURE WILL BE HELD FOR 5 DAYS BEFORE ISSUING A FINAL NEGATIVE REPORT     Performed at [facility]   Report Status PENDING   Incomplete  CLOSTRIDIUM DIFFICILE BY PCR     Status: None   Collection Time    04/26/13  5:46 PM      Result Value Ref Range Status   C difficile by pcr NEGATIVE  NEGATIVE Final  MRSA PCR SCREENING     Status: None   Collection Time    04/26/13  6:28 PM      Result Value Ref Range Status   MRSA by PCR NEGATIVE  NEGATIVE Final   Comment:            The GeneXpert MRSA Assay (FDA     approved for NASAL specimens     only), is one component of a     comprehensive MRSA colonization     surveillance program. It is not     intended to diagnose MRSA     infection nor to guide or     monitor treatment for     MRSA infections.     Studies:  Recent x-ray studies have been reviewed in detail by the Attending Physician  Lonia Blood, MD [facility] For Consults/Admissions - Flow Manager - [phone] Office  [phone]  Pager [phone]  On-Call/Text Page:      [email or web address]      password [email or web address]   If 7PM-7AM, please contact night-coverage [email or web address] Password [email or web address] 05/01/2013, 10:06 AM   LOS: 5 days

## 2013-05-02 DIAGNOSIS — K5730 Diverticulosis of large intestine without perforation or abscess without bleeding: Secondary | ICD-10-CM

## 2013-05-02 DIAGNOSIS — F329 Major depressive disorder, single episode, unspecified: Secondary | ICD-10-CM

## 2013-05-02 DIAGNOSIS — F3289 Other specified depressive episodes: Secondary | ICD-10-CM

## 2013-05-02 LAB — GLUCOSE, CAPILLARY
Glucose-Capillary: 102 mg/dL — ABNORMAL HIGH (ref 70–99)
Glucose-Capillary: 75 mg/dL (ref 70–99)

## 2013-05-02 LAB — CBC
HCT: 23.7 % — ABNORMAL LOW (ref 39.0–52.0)
Hemoglobin: 7.5 g/dL — ABNORMAL LOW (ref 13.0–17.0)
MCH: 24.1 pg — AB (ref 26.0–34.0)
MCHC: 31.6 g/dL (ref 30.0–36.0)
MCV: 76.2 fL — AB (ref 78.0–100.0)
Platelets: 325 10*3/uL (ref 150–400)
RBC: 3.11 MIL/uL — ABNORMAL LOW (ref 4.22–5.81)
RDW: 19.6 % — ABNORMAL HIGH (ref 11.5–15.5)
WBC: 16 10*3/uL — ABNORMAL HIGH (ref 4.0–10.5)

## 2013-05-02 LAB — BASIC METABOLIC PANEL
BUN: 20 mg/dL (ref 6–23)
CHLORIDE: 100 meq/L (ref 96–112)
CO2: 23 mEq/L (ref 19–32)
CREATININE: 1.12 mg/dL (ref 0.50–1.35)
Calcium: 9.5 mg/dL (ref 8.4–10.5)
GFR calc non Af Amer: 67 mL/min — ABNORMAL LOW (ref >90)
GFR, EST AFRICAN AMERICAN: 77 mL/min — AB (ref >90)
GLUCOSE: 79 mg/dL (ref 70–99)
Potassium: 3.7 mEq/L (ref 3.7–5.3)
Sodium: 138 mEq/L (ref 137–147)

## 2013-05-02 LAB — CULTURE, BLOOD (ROUTINE X 2): Culture: NO GROWTH

## 2013-05-02 LAB — PHOSPHORUS: Phosphorus: 3.8 mg/dL (ref 2.3–4.6)

## 2013-05-02 LAB — MAGNESIUM: MAGNESIUM: 1.7 mg/dL (ref 1.5–2.5)

## 2013-05-02 MED ORDER — VITAL 1.5 CAL PO LIQD
1000.0000 mL | ORAL | Status: DC
  Administered 2013-05-02: 1000 mL
  Filled 2013-05-02 (×5): qty 1000

## 2013-05-02 MED ORDER — ENSURE COMPLETE PO LIQD
237.0000 mL | ORAL | Status: DC
  Administered 2013-05-02 – 2013-05-03 (×2): 237 mL via ORAL

## 2013-05-02 MED ORDER — PRO-STAT SUGAR FREE PO LIQD
30.0000 mL | Freq: Three times a day (TID) | ORAL | Status: DC
  Administered 2013-05-02 – 2013-05-03 (×4): 30 mL
  Filled 2013-05-02 (×3): qty 30

## 2013-05-02 MED ORDER — ADULT MULTIVITAMIN W/MINERALS CH
1.0000 | ORAL_TABLET | Freq: Every day | ORAL | Status: DC
  Administered 2013-05-02 – 2013-05-03 (×2): 1 via ORAL
  Filled 2013-05-02 (×3): qty 1

## 2013-05-02 NOTE — Progress Notes (Addendum)
NUTRITION CONSULT/FOLLOW UP  DOCUMENTATION CODES Per approved criteria  -Severe malnutrition in the context of chronic illness   INTERVENTION: Change Resource Breeze to Ensure Complete po daily. Discontinue calorie count - pt is consuming < 25% of estimated nutrition needs at this time. Will initiate nocturnal feedings to meet majority of nutrition needs and maximize wound healing. Will continue to monitor and wean TF if/when indicated. Initiate nocturnal feedings of Vital 1.5 via G-tube at 25 ml/hr x 12 hours from 8pm - 8am. Advance by 10 ml q 4 hours to goal of 75 ml/hr x 12 hours. Add 30 ml Prostat liquid protein via tube TID. Goal regimen will provide: 1650 kcal, 106 grams protein, 688 ml free water. This will meet 92% of minimum estimated kcal needs and 100% of estimated protein needs. Add MVI daily. RD to follow for nutrition care plan  NUTRITION DIAGNOSIS: Increased nutrient needs related to malnutrition, multiple wounds as evidenced by estimated nutrition needs, ongoing  Goal: Pt to meet >/= 90% of their estimated nutrition needs, progressing  Monitor:  PO & supplemental intake, weight, labs, I/O's  ASSESSMENT: Patient with PMH of diverticulitis status post colostomy, A. fib not on anticoagulation, hypothyroidism, GERD, stage IV sacral decubitus ulcer and bilateral heel and ankle ulcers, generalized weakness who is a resident of SNF was sent from there with fever and hypoxia.  RD consulted.  Initial nutrition assessment completed 4/15.  Patient s/p MBSS 4/17.  Presented with mild oral phase dysphagia.  Diet advanced to Dysphagia 3, thin liquids.  Pt is eating very little, consuming <15% of meals. Per MD, pt is tolerating meds per tube without abdominal pain. Per Dr. Arthor CaptainElmahi, RD can initiate nocturnal feeds at this time if desired.  DG Portable Abdomen View 4/15 reviewed.  Patient has G-tube present with tip overlying the region of the stomach in left upper quadrant.  Potassium,  magnesium and phosphorus WNL. Ordered for zinc and vitamin C.  Height: Ht Readings from Last 1 Encounters:  04/26/13 5\' 8"  (1.727 m)    Weight: Wt Readings from Last 1 Encounters:  04/26/13 143 lb (64.864 kg)  No new weight available.  BMI:  Body mass index is 21.75 kg/(m^2). WNL  Estimated Nutritional Needs: Kcal: 1800 - 2000 Protein: 100 - 110 gm Fluid: 1.8 - 2.0 L  Skin: Stage IV sacral pressure ulcer, unstageable pressure ulcers to: R foot, L heel, L plantar foot and L malleolus  Diet Order: Dysphagia 3, thin liquids   Intake/Output Summary (Last 24 hours) at 05/02/13 0842 Last data filed at 05/02/13 0706  Gross per 24 hour  Intake    400 ml  Output   1200 ml  Net   -800 ml    Last BM: colostomy  Labs:   Recent Labs Lab 04/28/13 0430 04/29/13 0740 04/30/13 0437 05/01/13 0715 05/02/13 0450  NA 136* 136* 137 136* 138  K 3.9 4.2 4.2 4.4 3.7  CL 97 100 101 98 100  CO2 27 25 25 24 23   BUN 28* 31* 25* 23 20  CREATININE 1.15 1.12 1.11 1.15 1.12  CALCIUM 9.1 9.2 9.2 9.5 9.5  MG 1.6 1.9  --   --  1.7  PHOS 4.5  --   --  3.6 3.8  GLUCOSE 113* 102* 70 56* 79    CBG (last 3)  No results found for this basename: GLUCAP,  in the last 72 hours  Scheduled Meds: . amiodarone  200 mg Oral Daily  . antiseptic oral rinse  15 mL Mouth Rinse q12n4p  . aspirin  81 mg Oral Daily  . cefTAZidime (FORTAZ)  IV  1 g Intravenous 3 times per day  . chlorhexidine  15 mL Mouth Rinse BID  . collagenase   Topical Daily  . docusate sodium  100 mg Oral BID  . enoxaparin (LOVENOX) injection  40 mg Subcutaneous Q24H  . escitalopram  10 mg Oral Daily  . feeding supplement (RESOURCE BREEZE)  1 Container Oral TID BM  . folic acid  1 mg Oral Daily  . imipenem-cilastatin  250 mg Intravenous 4 times per day  . lansoprazole  30 mg Oral Daily  . levothyroxine  150 mcg Oral QAC breakfast  . mometasone-formoterol  2 puff Inhalation BID  . morphine CONCENTRATE  5 mg Oral Q6H  .  phosphorus  250 mg Oral BID  . sodium chloride  3 mL Intravenous Q12H  . thiamine  100 mg Oral Daily  . vitamin C  500 mg Oral Daily  . zinc sulfate  220 mg Oral Daily    Continuous Infusions: . sodium chloride 10 mL/hr at 04/30/13 1639    Jarold MottoSamantha Emsley Custer MS, RD, LDN Inpatient Registered Dietitian Pager: [phone] After-hours pager: [phone]

## 2013-05-02 NOTE — Progress Notes (Signed)
Progress Note  Craig [name] ZOX:[id number] DOB: [birth year] DOA: 04/26/2013 PCP: No primary provider on file.  Time spent : 35 minutes  Brief narrative: 68 year old male patient resident of a skilled nursing facility who has never been hospitalized at [facility]. Was sent from the skilled nursing facility because of fever and hypoxia x 24 hours. Patient has a significant past medical history of failure to thrive, chronic stage IV sacral decubitus as well as bilateral heel and ankle ulcers. He required a diverting colostomy. He has had a PEG tube placed in the past and has a PICC line in place for chronic parenteral nutrition. He was recently admitted to the skilled nursing facility after spending 2 months at [facility] after being treated at [facility] for complications related to the decubitus and subsequent formation of a diverting colostomy.  He apparently presented to the nursing facility from the LTAC with plans to continue antibiotics for 2 weeks. Patient recently completed a vancomycin course 24 hours prior to development of these symptoms. In addition to the fever and hypoxemia he reportedly had developed hemoptysis. In the emergency department his room air saturations were 81%. He had a leukocytosis of 15,000 and had no output from his colostomy.  The electronic medical records from [facility] as well as [facility] have been reviewed in detail back to 2010. He has a significant history of poor social situation including homelessness that required living in shelters as well as being [place] the county jail. 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 An October visit to the hospital documented poor living conditions and that the patient had been bed bound for several months but it was unclear if he had a decubitus at that time. He was discharged to a skilled nursing facility.  He returned to the  hospital in December of 2014 for influenza and healthcare acquired pneumonia and at that time a documented stage IV decubitus. He was later found to have Proteus pneumonia as well as E. Faecali bacteremia. CT scan done during that admission revealed sacral and coccygeal osteomyelitis but a three-phase bone scan done on 01/12/2013 demonstrated no evidence of osteomyelitis. Patient also has a history of atrial fibrillation but given his poor social situation he apparently was deemed an inappropriate candidate for long-term anticoagulation.  He underwent a diverting colostomy and apparent insertion of a gastric feeding tube in January 2015 and eventually was sent to [facility] and as noted was subsequently discharged to the skilled nursing facility.  It is unclear from the notes why TNA was being utilized in leu of the G tube for tube feeding.  Per the encounter in December 2014 at [facility] a sister is his legal guardian. A palliative evaluation was undertaken at that time and although she did not wish to change the patient's CODE STATUS from full resuscitation but she did wish to keep the patient comfortable.  HPI/Subjective: No complaints, seen by at the recommended nocturnal tube feeding.   Assessment/Plan:  Sepsis -Sepsis is secondary to UTI and pressure ulcer. -Patient currently on ceftazidime and oral fosfomycin, we'll add Primaxin. -Blood culture showed coagulase-negative staph, likely contaminant. Blood culture repeated.  UTI -Secondary to ESBL Klebsiella and Pseudomonas. -The patient is on ceftazidime and oral fosfomycin. -Was on fosfomycin, now on Primaxin for ESBL infection  Severe Pressure ulcers of bilateral feet w/ extensive necrotic tissue  -XR feet: Right foot without evidence of  osteomyelitis but left foot x-ray has possible area of osteomyelitis on fifth metatarsal - D/W Ortho Dr. Eulah Pont and since pt not diabetic he deferred any operative evaluation to  VVS -ABIs preliminary results: mild reduced flow on right and mild to moderate reduction on left suggesting blood supply adequate  -cont off load devices and local wound care per WOC RN at this time - if no improvement/progress made will need to consider VVS consultation  -cont morphine for pain  Coag negative staph positive blood cx -1/2 blood cx's positive for coag neg staph- c/w skin contaminant -h/o bacteremia recently and had just completed rxn -dc Vanco and follow clinically   Chronic COPD  -compensated at present without wheezing   Transaminitis / Hepatitis B -LFTs had increased slightly since admission - now trend is downward -Follow labs -Last CT abdomen and pelvis at [facility] in February 2015 demonstrated normal liver and biliary tree as well as normal pancreas and spleen and adrenal glands -Patient on Chronulac for presumed constipation prevention since no evidence of cirrhosis or hepatic encephalopathy in the past-have dc'd in favor of Colace  Unspecified hypothyroidism -cont Synthroid - check TSH in AM  Anemia -hgb appears stable around 7.5 -baseline 9.5 to 10.7 in Feb 2015 -B12 and folate normal - Fe studies pending   Sacral decubitus ulcer, stage IV -clean and granulating well -cont wound VAC and other care as outlined by WOC RN -OOB to chair - gelpad to chair  A-fib -currently maintaining NSR on Amiodarone  ?Dysphagia -Records from December 2014 admission to [facility] reveal that the patient was suspected of having dysphagia and an outpatient speech evaluation was recommended to be completed at the long-term care facility - unfortunately I do not have documentation regarding this -SLP eval and MBSS here confirm pt WITHOUT dysphagia so will begin diet -plain x-ray confirms tube in stomach - has been tolerating meds per tube without abdominal pain -dc PICC and cx tip and dc TNA -initiate nocturnal tube feed if oral intake not sufficient   Diverticulosis -No  apparent abdominal pain at this time -Continue Colace to prevent constipation  Depression -Seems to be a long-standing issue -Continue Lexapro  GERD -Continue Prevacid  Profound deconditioning / FTT in adult / Protein-calorie malnutrition, severe  -As above -Prealbumin 10.5 -begin enteral nutrition - dc PICC and TNA -PT/OT eval - pt says would like to walk    DVT prophylaxis: Lovenox Code Status: Full Family Communication: No family at bedside but per previous documentation at [facility] states patient's sister is his legal guardian Disposition Plan: Stable for transfer to med bed - cont to push oral nutrition - re-attempt PT/OT next week  Consultants: None  Procedures: ABIs - noted above   Antibiotics: Cefepime 4/14  Zosyn 4/15 >> 4/16 Vancomycin 4/14 >> 4/16 Ceftaz 4/17 >> Imipenem 4/17 Fosfomycin 4/17 >>  Objective: Blood pressure 119/58, pulse 80, temperature 98.3 F (36.8 C), temperature source Oral, resp. rate 18, height 5\' 8"  (1.727 m), weight 64.864 kg (143 lb), SpO2 97.00%.  Intake/Output Summary (Last 24 hours) at 05/02/13 1527 Last data filed at 05/02/13 0859  Gross per 24 hour  Intake    640 ml  Output   1200 ml  Net   -560 ml   Exam: General: No acute respiratory distress - cachectic  ENT; poor dentition Lungs: Clear to auscultation bilaterally without wheezes or crackles Cardiovascular: Regular rate and rhythm without murmur gallop or rub, no peripheral edema  Abdomen: Nontender, nondistended, soft, bowel sounds  positive, no rebound, no ascites, no appreciable mass - colostomy with flatus and stool in bag - gastrostomy tube clamped with minor oozing around insertion site Musculoskeletal: No significant cyanosis, clubbing of bilateral lower extremities Neurological: Awake and verbally responsive, generalized weakness without any obvious focal neurological deficits  Scheduled Meds:  Scheduled Meds: . amiodarone  200 mg Oral Daily  .  antiseptic oral rinse  15 mL Mouth Rinse q12n4p  . aspirin  81 mg Oral Daily  . cefTAZidime (FORTAZ)  IV  1 g Intravenous 3 times per day  . chlorhexidine  15 mL Mouth Rinse BID  . collagenase   Topical Daily  . docusate sodium  100 mg Oral BID  . enoxaparin (LOVENOX) injection  40 mg Subcutaneous Q24H  . escitalopram  10 mg Oral Daily  . feeding supplement (ENSURE COMPLETE)  237 mL Oral Q24H  . feeding supplement (PRO-STAT SUGAR FREE 64)  30 mL Per Tube TID WC  . folic acid  1 mg Oral Daily  . imipenem-cilastatin  250 mg Intravenous 4 times per day  . lansoprazole  30 mg Oral Daily  . levothyroxine  150 mcg Oral QAC breakfast  . mometasone-formoterol  2 puff Inhalation BID  . morphine CONCENTRATE  5 mg Oral Q6H  . multivitamin with minerals  1 tablet Oral Daily  . phosphorus  250 mg Oral BID  . sodium chloride  3 mL Intravenous Q12H  . thiamine  100 mg Oral Daily  . vitamin C  500 mg Oral Daily  . zinc sulfate  220 mg Oral Daily   Data Reviewed: Basic Metabolic Panel:  Recent Labs Lab 04/26/13 1135 04/27/13 0500 04/28/13 0430 04/29/13 0740 04/30/13 0437 05/01/13 0715 05/02/13 0450  NA 140 139 136* 136* 137 136* 138  K 4.9 3.9 3.9 4.2 4.2 4.4 3.7  CL 100 100 97 100 101 98 100  CO2 27 26 27 25 25 24 23   GLUCOSE 121* 102* 113* 102* 70 56* 79  BUN 42* 38* 28* 31* 25* 23 20  CREATININE 1.09 1.11 1.15 1.12 1.11 1.15 1.12  CALCIUM 10.3 9.5 9.1 9.2 9.2 9.5 9.5  MG  --  1.7 1.6 1.9  --   --  1.7  PHOS  --  4.5 4.5  --   --  3.6 3.8   Liver Function Tests:  Recent Labs Lab 04/26/13 1135 04/27/13 0500 04/28/13 0430 05/01/13 0715  AST 131* 217* 160* 86*  ALT 119* 207* 186* 134*  ALKPHOS 90 79 76 89  BILITOT 0.2* 0.3 <0.2* 0.3  PROT 8.7* 7.3 6.6 8.0  ALBUMIN 2.2* 1.9* 1.7* 1.9*   CBC:  Recent Labs Lab 04/26/13 1135 04/27/13 0500 04/28/13 0430 04/29/13 0740 04/30/13 0437 05/01/13 0715 05/02/13 0450  WBC 16.9* 14.5* 11.5* 11.9* 15.8* 15.7* 16.0*  NEUTROABS  12.0* 9.2*  --   --   --   --   --   HGB 9.8* 7.9* 7.2* 7.5* 7.5* 8.1* 7.5*  HCT 30.5* 25.1* 22.7* 23.8* 23.2* 25.2* 23.7*  MCV 78.8 78.9 77.2* 77.5* 76.6* 76.6* 76.2*  PLT 284 214 219 258 249 302 325    CBG:  Recent Labs Lab 04/28/13 1107 04/28/13 1659 04/28/13 2351 04/29/13 0559 05/02/13 1354  GLUCAP 121* 119* 142* 103* 102*    Recent Results (from the past 240 hour(s))  URINE CULTURE     Status: None   Collection Time    04/26/13 11:20 AM      Result Value Ref Range  Status   Specimen Description URINE, CATHETERIZED   Final   Special Requests Normal   Final   Culture  Setup Time     Final   Value: 04/26/2013 12:09     Performed at [facility]   Colony Count     Final   Value: >=100,000 COLONIES/ML     Performed at [facility]   Culture     Final   Value: KLEBSIELLA PNEUMONIAE     Note: Confirmed Extended Spectrum Beta-Lactamase Producer (ESBL) CRITICAL RESULT CALLED TO, READ BACK BY AND VERIFIED WITH: IRBY AY 5:48 A.M. ON 04/29/13 WARRB MEROPENEM=SENSITIVE <=0.25     PSEUDOMONAS AERUGINOSA     Performed at [facility]   Report Status 04/30/2013 FINAL   Final   Organism ID, Bacteria KLEBSIELLA PNEUMONIAE   Final   Organism ID, Bacteria PSEUDOMONAS AERUGINOSA   Final  CULTURE, BLOOD (ROUTINE X 2)     Status: None   Collection Time    04/26/13 11:35 AM      Result Value Ref Range Status   Specimen Description BLOOD LEFT HAND   Final   Special Requests BOTTLES DRAWN AEROBIC AND ANAEROBIC 5CC   Final   Culture  Setup Time     Final   Value: 04/26/2013 16:17     Performed at [facility]   Culture     Final   Value: STAPHYLOCOCCUS SPECIES (COAGULASE NEGATIVE)     Note: THE SIGNIFICANCE OF ISOLATING THIS ORGANISM FROM A SINGLE SET OF BLOOD CULTURES WHEN MULTIPLE SETS ARE DRAWN IS UNCERTAIN. PLEASE NOTIFY THE MICROBIOLOGY DEPARTMENT WITHIN ONE WEEK IF SPECIATION AND SENSITIVITIES ARE REQUIRED.     Note: Gram Stain Report Called to,Read  Back By and Verified With: MATT YORK 04/28/13 1239A [facility]     Performed at [facility]   Report Status 04/29/2013 FINAL   Final  CULTURE, BLOOD (ROUTINE X 2)     Status: None   Collection Time    04/26/13 11:45 AM      Result Value Ref Range Status   Specimen Description BLOOD LEFT ANTECUBITAL   Final   Special Requests BOTTLES DRAWN AEROBIC AND ANAEROBIC 5CC   Final   Culture  Setup Time     Final   Value: 04/26/2013 16:17     Performed at [facility]   Culture     Final   Value: NO GROWTH 5 DAYS     Performed at [facility]   Report Status 05/02/2013 FINAL   Final  CLOSTRIDIUM DIFFICILE BY PCR     Status: None   Collection Time    04/26/13  5:46 PM      Result Value Ref Range Status   C difficile by pcr NEGATIVE  NEGATIVE Final  MRSA PCR SCREENING     Status: None   Collection Time    04/26/13  6:28 PM      Result Value Ref Range Status   MRSA by PCR NEGATIVE  NEGATIVE Final   Comment:            The GeneXpert MRSA Assay (FDA     approved for NASAL specimens     only), is one component of a     comprehensive MRSA colonization     surveillance program. It is not     intended to diagnose MRSA     infection nor to guide or     monitor treatment for  MRSA infections.  WOUND CULTURE     Status: None   Collection Time    05/01/13  5:00 AM      Result Value Ref Range Status   Specimen Description WOUND LEFT FOOT   Final   Special Requests NONE   Final   Gram Stain     Final   Value: NO WBC SEEN     RARE SQUAMOUS EPITHELIAL CELLS PRESENT     NO ORGANISMS SEEN     Performed at [facility]   Culture     Final   Value: Culture reincubated for better growth     Performed at [facility]   Report Status PENDING   Incomplete  WOUND CULTURE     Status: None   Collection Time    05/01/13  5:21 AM      Result Value Ref Range Status   Specimen Description WOUND RIGHT FOOT   Final   Special Requests NONE   Final   Gram Stain      Final   Value: RARE WBC PRESENT, PREDOMINANTLY PMN     RARE SQUAMOUS EPITHELIAL CELLS PRESENT     NO ORGANISMS SEEN     Performed at [facility]   Culture     Final   Value: Culture reincubated for better growth     Performed at [facility]   Report Status PENDING   Incomplete     Studies:  Recent x-ray studies have been reviewed in detail by the Attending Physician  Lonia Blood, MD [facility] For Consults/Admissions - Flow Manager - [phone] Office  [phone] Pager [phone]  On-Call/Text Page:      [email or web address]      password [email or web address]   If 7PM-7AM, please contact night-coverage [email or web address] Password [email or web address] 05/02/2013, 3:27 PM   LOS: 6 days

## 2013-05-03 LAB — GLUCOSE, CAPILLARY
GLUCOSE-CAPILLARY: 111 mg/dL — AB (ref 70–99)
GLUCOSE-CAPILLARY: 105 mg/dL — AB (ref 70–99)
Glucose-Capillary: 111 mg/dL — ABNORMAL HIGH (ref 70–99)
Glucose-Capillary: 87 mg/dL (ref 70–99)

## 2013-05-03 MED ORDER — DEXTROSE 5 % IV SOLN: 1.0000 g | Freq: Three times a day (TID) | INTRAVENOUS | Status: DC

## 2013-05-03 MED ORDER — SODIUM CHLORIDE 0.9 % IJ SOLN: 10.0000 mL | INTRAMUSCULAR | Status: DC | PRN

## 2013-05-03 MED ORDER — MORPHINE SULFATE (CONCENTRATE) 20 MG/ML PO SOLN
ORAL | Status: DC
Start: 2013-05-03 — End: 2013-05-11

## 2013-05-03 MED ORDER — SODIUM CHLORIDE 0.9 % IV SOLN: 250.0000 mg | Freq: Four times a day (QID) | INTRAVENOUS | Status: DC

## 2013-05-03 NOTE — Clinical Social Work Note (Signed)
Per MD [facility] RN, patient, friend Mr. Effie ShyColeman, and sister [place] Zaragosa informed of DC. RN given number for report. DC packet on chart. Ambulance transport requested for patient. CSW assessed patient for return to [facility], full assessment to come.  Roddie McBryant Marlis Oldaker MSW, [place]WA, [place], [phone]

## 2013-05-03 NOTE — Progress Notes (Signed)
Patient was discharged to nursing home by MD order; discharged instructions  and prescriptions sent with patient to the facility [name] IV DIC; PICC line on right arm; Foley in place; dressing wet to dry on sacral wound; oxygen 2L/min via [place]; facility was called and report was given to the nurse;  patient will be transported to the facility via EMS.

## 2013-05-03 NOTE — Progress Notes (Signed)
Patient refused midline catheter insertion - MD notified.

## 2013-05-03 NOTE — Progress Notes (Addendum)
NUTRITION FOLLOW UP  DOCUMENTATION CODES Per approved criteria  -Severe malnutrition in the context of chronic illness   INTERVENTION: Continue Ensure Complete and MVI po daily. Continue advancement of nocturnal feedings of Vital 1.5 via G-tube 10 ml q 4 hours to goal of 75 ml/hr x 12 hours (8p - 8a). Continue 30 ml Prostat liquid protein via tube TID. Goal regimen will provide: 1650 kcal, 106 grams protein, 688 ml free water. This will meet 92% of minimum estimated kcal needs and 100% of estimated protein needs. Recommend free water flushes of 185 ml q 4 hours via tube, once IVF discontinued; to be further adjusted by SNF RD/MD. RD to follow for nutrition care plan.  NUTRITION DIAGNOSIS: Increased nutrient needs related to malnutrition, multiple wounds as evidenced by estimated nutrition needs, ongoing  Goal: Pt to meet >/= 90% of their estimated nutrition needs, progressing  Monitor:  PO & supplemental intake, weight, labs, I/O's  ASSESSMENT: Patient with PMH of diverticulitis status post colostomy, A. fib not on anticoagulation, hypothyroidism, GERD, stage IV sacral decubitus ulcer and bilateral heel and ankle ulcers, generalized weakness who is a resident of SNF was sent from there with fever and hypoxia.  Patient s/p MBSS 4/17.  Presented with mild oral phase dysphagia.  Diet advanced to Dysphagia 3, thin liquids.  Pt is eating very little, consuming <15% of meals. Per MD, pt is tolerating meds per tube without abdominal pain.  Pt placed on nocturnal feedings 4/20 - per RN, pt tolerated well.  DG Portable Abdomen View 4/15 reviewed.  Patient has G-tube present with tip overlying the region of the stomach in left upper quadrant.  Potassium, magnesium and phosphorus WNL. Ordered for zinc, vitamin C, MVI daily.  Height: Ht Readings from Last 1 Encounters:  04/26/13 5\' 8"  (1.727 m)    Weight: Wt Readings from Last 1 Encounters:  05/03/13 136 lb (61.689 kg)  Admit wt 143  lb  BMI:  Body mass index is 20.68 kg/(m^2). WNL  Estimated Nutritional Needs: Kcal: 1800 - 2000 Protein: 100 - 110 gm Fluid: 1.8 - 2.0 L  Skin: Stage IV sacral pressure ulcer, unstageable pressure ulcers to: R foot, L heel, L plantar foot and L malleolus  Diet Order: Dysphagia 3, thin liquids   Intake/Output Summary (Last 24 hours) at 05/03/13 0907 Last data filed at 05/03/13 0606  Gross per 24 hour  Intake 734.83 ml  Output    500 ml  Net 234.83 ml    Last BM: colostomy  Labs:   Recent Labs Lab 04/28/13 0430 04/29/13 0740 04/30/13 0437 05/01/13 0715 05/02/13 0450  NA 136* 136* 137 136* 138  K 3.9 4.2 4.2 4.4 3.7  CL 97 100 101 98 100  CO2 27 25 25 24 23   BUN 28* 31* 25* 23 20  CREATININE 1.15 1.12 1.11 1.15 1.12  CALCIUM 9.1 9.2 9.2 9.5 9.5  MG 1.6 1.9  --   --  1.7  PHOS 4.5  --   --  3.6 3.8  GLUCOSE 113* 102* 70 56* 79    CBG (last 3)   Recent Labs  05/03/13 0018 05/03/13 0611 05/03/13 0751  GLUCAP 87 111* 105*    Scheduled Meds: . amiodarone  200 mg Oral Daily  . antiseptic oral rinse  15 mL Mouth Rinse q12n4p  . aspirin  81 mg Oral Daily  . cefTAZidime (FORTAZ)  IV  1 g Intravenous 3 times per day  . chlorhexidine  15 mL Mouth Rinse  BID  . collagenase   Topical Daily  . docusate sodium  100 mg Oral BID  . enoxaparin (LOVENOX) injection  40 mg Subcutaneous Q24H  . escitalopram  10 mg Oral Daily  . feeding supplement (ENSURE COMPLETE)  237 mL Oral Q24H  . feeding supplement (PRO-STAT SUGAR FREE 64)  30 mL Per Tube TID WC  . folic acid  1 mg Oral Daily  . imipenem-cilastatin  250 mg Intravenous 4 times per day  . lansoprazole  30 mg Oral Daily  . levothyroxine  150 mcg Oral QAC breakfast  . mometasone-formoterol  2 puff Inhalation BID  . morphine CONCENTRATE  5 mg Oral Q6H  . multivitamin with minerals  1 tablet Oral Daily  . phosphorus  250 mg Oral BID  . sodium chloride  3 mL Intravenous Q12H  . thiamine  100 mg Oral Daily  . vitamin  C  500 mg Oral Daily  . zinc sulfate  220 mg Oral Daily    Continuous Infusions: . sodium chloride 10 mL/hr at 05/02/13 0700  . feeding supplement (VITAL 1.5 CAL) 1,000 mL (05/02/13 2013)    Jarold MottoSamantha Colon Rueth MS, RD, LDN Inpatient Registered Dietitian Pager: [phone] After-hours pager: [phone]

## 2013-05-03 NOTE — Progress Notes (Addendum)
  Explained Midline procedure, risks and benefits to patient.  Patient states that he doesn't want it.  Staff RN notified

## 2013-05-03 NOTE — Discharge Summary (Signed)
Physician Discharge Summary  Craig [name] Duprey ZOX:[id number] DOB: 06/02/1945 DOA: 04/26/2013  PCP: No primary provider on file.  Admit date: 04/26/2013 Discharge date: 05/03/2013  Time spent: 40 minutes  Recommendations for Outpatient Follow-up:  1. Followup with nursing home M.D. 2. Elita QuickFortaz and Primaxin for Pseudomonas and ESBL Klebsiella for 5 more days. D/C midline after antibiotics done. 3. Discontinue TPN, dysphagia 3 diet with thin liquids, nocturnal tube feeding.  Tube feeding recommendation 1. Nocturnal feeding of Vital 1.5 via G-tube 10 mL every 4 hours goal of 75 mL per hour for 12 hours from 8 PM to 8 AM 2. Continue 30 mL of ProStat liquid protein via tube 3 times a day. 3. Free water flushes of 185 mL every 4 hours.  Discharge Diagnoses:  Active Problems:   Protein-calorie malnutrition, severe   Diverticulosis   Unspecified hypothyroidism   Sacral decubitus ulcer, stage IV   Depression   A-fib   GERD (gastroesophageal reflux disease)   UTI (lower urinary tract infection)   Sepsis   Pressure ulcer of foot   FTT (failure to thrive) in adult   HTN (hypertension)   COPD (chronic obstructive pulmonary disease)   Hepatitis B   On total parenteral nutrition (TPN)   Dysphagia   Bacteremia due to Gram-positive bacteria   Anemia   Discharge Condition: Fair  Diet recommendation: Dysphagia 3 with a thin liquids  Filed Weights   04/26/13 2200 05/03/13 0455  Weight: 64.864 kg (143 lb) 61.689 kg (136 lb)    History of present illness:  68 year old African American male with history of diverticulitis status post colostomy, A. fib not on anticoagulation, hypothyroidism, GERD, depression, severe protein calorie malnutrition, stage IV sacral decubitus ulcer and bilateral heel and ankle ulcers, generalized weakness who is a resident of skilled nursing facility was sent from there with fever one day back and episode of hemoptysis with findings of hypoxia on room air. Patient was  admitted to skilled nursing facility from [facility] about 2 months back where he had been managed for respiratory failure after [facility] with [facility] and influenza. He had a diverting colostomy and GJ tube to help with the healing of his stage IV sacral decubitus ulcer. He is being followed by Dr. Lorenz CoasterKeller with [facility] and currently has a wound vac on his sacral wound.Patient also has a multiple pressure ulcers over the ankles, heels and foot. Patient has a left sided PICC line with TPN and was treated with IV antibiotics for pneumonia when he first came to the skilled nursing facility. He was treated with Levaquin and a 2 weeks course of vancomycin again I recurrent pneumonia. Patient completed his course of vancomycin only yesterday.  Patient reportedly had a fever overnight and also had an episode of hemoptysis. (Blood-tinged yellow sputum). This morning he was found to be in respiratory distress and tachycardic . His O2 sats dropped to 81% on room air. Blood work done that showed leukocytosis with WBC of 15,000. Patient placed on 2 L via nasal cannula and O2 sats improved we'll 91%. Patient denied any headache,nausea , vomiting, chest pain, palpitations, SOB, abdominal pain. Did not have any increased output from colostomy.  At baseline he is nonambulatory.   Hospital Course:  68 year old male patient resident of a skilled nursing facility who has never been hospitalized at [facility]. Was sent from the skilled nursing facility because of fever and hypoxia x 24 hours. Patient has a significant past medical history of  failure to thrive, chronic stage IV sacral decubitus as well as bilateral heel and ankle ulcers. He required a diverting colostomy. He has had a PEG tube placed in the past and has a PICC line in place for chronic parenteral nutrition. He was recently admitted to the skilled nursing facility after spending 2 months at [facility] after  being treated at [facility] for complications related to the decubitus and subsequent formation of a diverting colostomy.  He apparently presented to the nursing facility from the LTAC with plans to continue antibiotics for 2 weeks. Patient recently completed a vancomycin course 24 hours prior to development of these symptoms. In addition to the fever and hypoxemia he reportedly had developed hemoptysis. In the emergency department his room air saturations were 81%. He had a leukocytosis of 15,000 and had no output from his colostomy.  The electronic medical records from [facility] as well as [facility] have been reviewed in detail back to 2010. He has a significant history of poor social situation including homelessness that required living in shelters as well as being incarcerated in the county jail. 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 An October visit to the hospital documented poor living conditions and that the patient had been bed bound for several months but it was unclear if he had a decubitus at that time. He was discharged to a skilled nursing facility.  He returned to the hospital in December of 2014 for influenza and healthcare acquired pneumonia and at that time a documented stage IV decubitus. He was later found to have Proteus pneumonia as well as E. Faecali bacteremia. CT scan done during that admission revealed sacral and coccygeal osteomyelitis but a three-phase bone scan done on 01/12/2013 demonstrated no evidence of osteomyelitis. Patient also has a history of atrial fibrillation but given his poor social situation he apparently was deemed an inappropriate candidate for long-term anticoagulation.  He underwent a diverting colostomy and apparent insertion of a gastric feeding tube in January 2015 and eventually was sent to [facility] and as noted was subsequently discharged to the skilled nursing facility.  It is unclear from the notes why TNA was being utilized in leu of the G tube for tube feeding.  Per the encounter in December 2014 at [facility] a sister is his legal guardian. A palliative evaluation was undertaken at that time and although she did not wish to change the patient's CODE STATUS from full resuscitation but she did wish to keep the patient comfortable.   Sepsis  -Sepsis is secondary to UTI and pressure ulcer.  -Patient currently on ceftazidime and oral fosfomycin, we'll add Primaxin.  -Blood culture showed coagulase-negative staph, likely contaminant. Blood culture repeated.   UTI  -Secondary to ESBL Klebsiella and Pseudomonas.  -The patient is on ceftazidime and oral fosfomycin.  -Was on fosfomycin, now on Primaxin for ESBL infection   Severe Pressure ulcers of bilateral feet w/ extensive necrotic tissue  -XR feet: Right foot without evidence of osteomyelitis but left foot x-ray has possible area of osteomyelitis on fifth metatarsal - D/W Ortho Dr. Eulah Pont and since pt not diabetic he deferred any operative evaluation to [facility]VS.  -ABIs preliminary results: mild reduced flow on right and mild to moderate reduction on left suggesting blood supply adequate.  -cont off load devices and local wound care per WOC RN at this time - if no improvement/progress made will need to consider  VVS consultation as outpatient. Patient seen previously for his decubitus ulcers in [facility] and can be referred back to them. -Continue oral morphine for pain control.  Coag negative staph positive blood cx  -1/2 blood cx's positive for coag neg staph- c/w skin contaminant  -h/o bacteremia recently and had just completed rxn  -dc Vanco and follow clinically   Chronic COPD  -compensated at present without wheezing   Transaminitis -LFTs had increased slightly since admission - now trend is downward , negative acute hepatitis panel. -Needs followup labs as outpatient. -[facility] CT [facility] 2015 demonstrated normal liver and biliary tree as well as normal pancreas and spleen and adrenal glands  -Patient on Chronulac for presumed constipation prevention since no evidence of cirrhosis or hepatic encephalopathy.   Unspecified hypothyroidism  -cont Synthroid - check TSH in AM   Anemia  -hgb appears stable around 7.5  -baseline 9.5 to 10.7 in Feb 2015  -B12 and folate normal - Fe studies pending   Sacral decubitus ulcer, stage IV  -clean and granulating well  -cont wound VAC and other care as outlined by WOC RN  -OOB to chair - gelpad to chair   A-fib  -currently maintaining NSR on Amiodarone   ?Dysphagia  -Records from December 2014 admission to [facility] reveal that the patient was suspected of having dysphagia and an outpatient speech evaluation was recommended to be completed at the long-term care facility - unfortunately I do not have documentation regarding this  -SLP eval and MBSS here confirm pt WITHOUT dysphagia so will begin diet  -plain x-ray confirms tube in stomach - has been tolerating meds per tube without abdominal pain  -dc PICC and cx tip and dc TNA  -initiate nocturnal tube feed if oral intake not sufficient   Diverticulosis  -No apparent abdominal pain at this time  -Continue lactulose to prevent constipation   Depression  -Seems to be a long-standing issue  -Continue Lexapro   GERD  -Continue Prevacid   Profound deconditioning / FTT in adult / Protein-calorie malnutrition, severe  -As above  -Prealbumin 10.5  -begin enteral nutrition - dc PICC and TNA  -PT/OT eval - pt says would like to walk  Procedures:  Discontinuation of PICC line and Placement of midline  Consultations:  None  Discharge Exam: Filed Vitals:   05/03/13 0455  BP: 122/61  Pulse: 69  Temp: 98.5 F (36.9 C)  Resp: 18   General: Alert and awake, oriented x3, not in any acute distress, very emaciated African American male HEENT: anicteric sclera,  pupils reactive to light and accommodation, EOMI CVS: S1-S2 clear, no murmur rubs or gallops Chest: clear to auscultation bilaterally, no wheezing, rales or rhonchi Abdomen: soft nontender, nondistended, normal bowel sounds, no organomegaly Extremities: no cyanosis, clubbing or edema noted bilaterally Neuro: Cranial nerves II-XII intact, no focal neurological deficits   Discharge Instructions You were cared for by a hospitalist during your hospital stay. If you have any questions about your discharge medications or the care you received while you were in the hospital after you are discharged, you can call the unit and asked to speak with the hospitalist on call if the hospitalist that took care of you is not available. Once you are discharged, your primary care physician will handle any further medical issues. Please note that NO REFILLS for any discharge medications will be authorized once you are discharged, as it is imperative that you return to your primary care  physician (or establish a relationship with a primary care physician if you do not have one) for your aftercare needs so that they can reassess your need for medications and monitor your lab values.  Discharge Orders   Future Orders Complete By Expires   Diet - low sodium heart healthy  As directed    Increase activity slowly  As directed        Medication List    STOP taking these medications       metolazone 10 MG tablet  Commonly known as:  ZAROXOLYN     TPN ADULT      TAKE these medications       amiodarone 200 MG tablet  Commonly known as:  PACERONE  Take 200 mg by mouth daily.     aspirin 81 MG tablet  Take 81 mg by mouth daily.     BREO ELLIPTA 100-25 MCG/INH Aepb  Generic drug:  Fluticasone Furoate-Vilanterol  Inhale 1 puff into the lungs daily.     cefTAZidime 1 g in dextrose 5 % 50 mL  Inject 1 g into the vein every 8 (eight) hours.     [employer] CALCIUM ALGINATE 4"X4" EX  Apply 1 application topically. To  left heel     DECUBI-VITE PO  Take 1 capsule by mouth daily.     escitalopram 10 MG tablet  Commonly known as:  LEXAPRO  Take 10 mg by mouth daily.     folic acid 1 MG tablet  Commonly known as:  FOLVITE  Take 1 mg by mouth daily.     HYDROGEL Gel  1 application by Does not apply route every other day. To left ankle     HYDROGEL Gel  Apply 1 application topically every other day. To right ankle     imipenem-cilastatin 250 mg in sodium chloride 0.9 % 100 mL  Inject 250 mg into the vein every 6 (six) hours.     lactulose 10 GM/15ML solution  Commonly known as:  CHRONULAC  Take 20 g by mouth daily.     lansoprazole 30 MG disintegrating tablet  Commonly known as:  PREVACID SOLUTAB  Take 30 mg by mouth daily.     levothyroxine 150 MCG tablet  Commonly known as:  SYNTHROID, LEVOTHROID  Take 150 mcg by mouth daily before breakfast.     Melatonin 3 MG Tabs  Take 1 tablet by mouth daily.     metoCLOPramide 5 MG/5ML solution  Commonly known as:  REGLAN  Take 5 mg by mouth every 6 (six) hours as needed for nausea.     morphine 20 MG/ML concentrated solution  Commonly known as:  ROXANOL  Give 0.27ml per G Tube once daily 30 minutes before dressing changes; Give 0.68ml per G Tube every 6 hours for pain     PHOSPHA 250 NEUTRAL PO  Take 1 capsule by mouth 2 (two) times daily.     potassium chloride 20 MEQ packet  Commonly known as:  KLOR-CON  20 mEq by PEG Tube route 2 (two) times daily.     SANTYL ointment  Generic drug:  collagenase  Apply 1 application topically 3 (three) times daily. To right plantar surface     thiamine 100 MG tablet  Take 100 mg by mouth daily.       No Known Allergies    The results of significant diagnostics from this hospitalization (including imaging, microbiology, ancillary and laboratory) are listed below for reference.    Significant Diagnostic Studies:  Ct Angio Chest Pe W/cm &/or Wo Cm  04/26/2013   CLINICAL DATA:  Shortness of  breath and hemoptysis for 1 day  EXAM: CT ANGIOGRAPHY CHEST WITH CONTRAST  TECHNIQUE: Multidetector CT imaging of the chest was performed using the standard protocol during bolus administration of intravenous contrast. Multiplanar CT image reconstructions and MIPs were obtained to evaluate the vascular anatomy.  CONTRAST:  OMNIPAQUE IOHEXOL 350 MG/ML SOLN  COMPARISON:  DG CHEST 1V PORT dated 04/26/2013  FINDINGS: Contrast within the pulmonary arterial tree is normal in appearance. There are no filling defects to suggest an acute pulmonary embolism. The caliber of the thoracic aorta is normal. There is no evidence of a false lumen. The cardiac chambers are normal in size. There are coronary artery calcifications present. There is no pleural nor pericardial effusion. There are borderline enlarged mediastinal and hilar lymph nodes bilaterally. The largest mediastinal lymph node lies anterior to the left mainstem bronchus and measures 1.3 cm in short axis. A pre carinal node measures approximately the same in short axis. A right hilar lymph node measures 1.7 cm in short axis. The retrosternal soft tissues appear normal. The thoracic esophagus is not abnormally distended.  At lung window settings there are emphysematous changes bilaterally. There are patchy areas of confluent increased density that are partially pleural based in both lower lobes. There is bronchiectasis in both lower lobes. No suspicious masses are demonstrated but small nodules could be obscured by areas of consolidation in the lower lobes.  Within the upper abdomen the observed portions of the liver and spleen and adrenal glands appear normal. There is a tubular structure present likely within the duodenum. It is not completely included in the field of view. It does not appear to be an esophagogastric tube.  The thoracic vertebral bodies are preserved in height. The sternum appears intact. The observed portions of the ribs exhibit no acute  abnormalities.  Review of the MIP images confirms the above findings.  IMPRESSION: 1. There is no evidence of an acute pulmonary embolism or acute thoracic aortic pathology. 2. There is bronchiectasis within both lower lobes with areas of parenchymal consolidation distal to the areas of bronchiectasis. The findings likely reflect bibasilar pneumonia. 3. There are mildly enlarged mediastinal and hilar lymph nodes which may be reactive but follow-up is needed. 4. There is no evidence of CHF. There are coronary artery calcifications present.   Electronically Signed   By: David  [place]   On: 04/26/2013 17:33   Dg Chest Portable 1 View  04/26/2013   CLINICAL DATA:  Shortness of breath  EXAM: PORTABLE CHEST - 1 VIEW  COMPARISON:  None.  FINDINGS: Cardiac shadow is within normal limits. The lungs are clear bilaterally. A left-sided PICC line is within normal limits. No bony abnormality is noted.  IMPRESSION: No acute abnormality noted.   Electronically Signed   By: Alcide Clever M.D.   On: 04/26/2013 12:34   Dg Abd Portable 1v  04/27/2013   CLINICAL DATA:  Gastrostomy tube placement.  Question ileus.  EXAM: PORTABLE ABDOMEN - 1 VIEW  COMPARISON:  None.  FINDINGS: The gastrostomy tube is present with tip overlying the region of the stomach in the left upper quadrant. Segment of the gastrostomy tube external to the skin appears somewhat low. Bowel gas pattern is nonobstructive. There is moderate degenerative change of the lumbar spine.  IMPRESSION: Nonobstructive bowel gas pattern.  Gastrostomy tube with tip overlying the stomach in the left upper quadrant. Segment of  tube immediately external to the skin appears somewhat low as recommend clinical correlation.   Electronically Signed   By: Elberta Fortisaniel  Boyle M.D.   On: 04/27/2013 15:07   Dg Foot 2 Views Left  04/27/2013   CLINICAL DATA:  Wounds on bilateral feet, evaluate for osteo  EXAM: LEFT FOOT - 2 VIEW  COMPARISON:  None.  FINDINGS: Suspected soft tissue wound along  the plantar aspect of the midfoot, best visualized on the lateral view.  Mild cortical regularity involving the overlying the base of the 5th metatarsal, at least raising the possibility of osteomyelitis.  No fracture or dislocation is seen.  Mild degenerative changes at the 1st MTP joint.  IMPRESSION: Suspected soft tissue wound along the plantar aspect of the midfoot.  Mild cortical regularity involving the base of the 5th metatarsal, at least raising the possibility of osteomyelitis. If further imaging is desired, consider MRI.   Electronically Signed   By: Charline BillsSriyesh  Krishnan M.D.   On: 04/27/2013 14:41   Dg Foot 2 Views Right  04/27/2013   CLINICAL DATA:  Soft tissue wound involving the bilateral feet, evaluate for osteomyelitis  EXAM: RIGHT FOOT - 2 VIEW  COMPARISON:  None.  FINDINGS: Suspected soft tissue wound along the plantar aspect of the midfoot, best visualized on the lateral view.  No definite overlying cortical regularity to suggest osteomyelitis.  No fracture or dislocation is seen.  Mild degenerative changes at the 1st MTP joint.  IMPRESSION: Suspected soft tissue wound along the plantar aspect of the midfoot.  No radiographic findings to suggest osteomyelitis.   Electronically Signed   By: Charline BillsSriyesh  Krishnan M.D.   On: 04/27/2013 14:47   Dg Swallowing Func-speech Pathology  04/29/2013   Carolan ShiverAmanda Laurice Couture, CCC-SLP     04/29/2013 10:36 AM Objective Swallowing Evaluation: Modified Barium Swallowing Study   Patient Details  Name: Craig [name] Ehly MRN: [id number] Date of Birth: March 06, 1945  Today's Date: 04/29/2013 Time: 0950-1010 SLP Time Calculation (min): 20 min  Past Medical History:  Past Medical History  Diagnosis Date  . COPD (chronic obstructive pulmonary disease)   . Hypertension   . Myocardial infarction   . A-fib   . GERD (gastroesophageal reflux disease)    Past Surgical History: History reviewed. No pertinent past  surgical history. HPI:  68 year old male patient resident of a skilled  nursing facility  admitted with  fever and hypoxia x 24 hours. Patient has a  significant past medical history of tobacco, crack abuse and  possible alcohol abuse, failure to thrive, chronic stage IV  sacral decubitus as well as bilateral heel and ankle ulcers. S/p  diverting colostomy. Apparently was hospitalized in December 2014  for influenza and HCAP. G-tube placed 01/2013.  [facility] and as noted was subsequently discharged to  the skilled nursing facility. It is unclear from the notes why  TNA was being utilized in leu of the G tube for tube feeding.  Records from December 2014 admission to [facility] reveal that the  patient was suspected of having dysphagia and an outpatient  speech evaluation was recommended to be completed at the  long-term care facility unfortunately I do not have documentation  regarding this Patient presents to this facility with a PEG tube  but unclear if functioning or if was actually being used at the  nursing facility other than for medications. Per patient, he has  not had anything by mouth for the past 2 years, unclear origin.  Had clinical swallow  eval 4/16 with recs to proceed with MBS.     Assessment / Plan / Recommendation Clinical Impression  Dysphagia Diagnosis: Mild oral phase dysphagia Clinical impression: Pt with quite functional oropharyngeal  swallow, marked by mild oral preparation delays (related to  dentition,) and high penetration of thin liquids, but no  aspiration, even when taxed with large, successive thin liquid  boluses.  Recommend beginning a mechanical soft diet with thin  liquids, meds whole with liquid.  Pt may need assist with tray  set-up and self-feeding.  No further SLP warranted - will sign  off.      Treatment Recommendation    none   Diet Recommendation Dysphagia 3 (Mechanical Soft);Thin liquid   Liquid Administration via: Cup;Straw Medication Administration: Whole meds with liquid Supervision: Staff to assist with self feeding  (tray set-up) Postural Changes and/or Swallow Maneuvers: Seated upright 90  degrees    Other  Recommendations Oral Care Recommendations: Oral care BID   Follow Up Recommendations  None    SLP Swallow Goals     General HPI: 68 year old male patient resident of a skilled  nursing facility admitted with  fever and hypoxia x 24 hours.  Patient has a significant past medical history of tobacco, crack  abuse and possible alcohol abuse, failure to thrive, chronic  stage IV sacral decubitus as well as bilateral heel and ankle  ulcers. S/p diverting colostomy. Apparently was hospitalized in  December 2014 for influenza and HCAP. G-tube placed 01/2013.  Sent  to [facility] and as noted was subsequently  discharged to the skilled nursing facility. It is unclear from  the notes why TNA was being utilized in leu of the G tube for  tube feeding. Records from December 2014 admission to [facility]  reveal that the patient was suspected of having dysphagia and an  outpatient speech evaluation was recommended to be completed at  the long-term care facility unfortunately I do not have  documentation regarding this Patient presents to this facility  with a PEG tube but unclear if functioning or if was actually  being used at the nursing facility other than for medications.  Per patient, he has not had anything by mouth for the past 2  years, unclear origin.  Type of Study: Modified Barium Swallowing Study Reason for Referral: Objectively evaluate swallowing function Previous Swallow Assessment: none noted, [facility], mentation did not allow for swallow  assessment Diet Prior to this Study: NPO;TNA (Jtube) Temperature Spikes Noted: No Respiratory Status: Nasal cannula History of Recent Intubation: No Behavior/Cognition: Alert;Cooperative;Pleasant mood Oral Cavity - Dentition: Missing dentition;Poor condition Oral Motor / Sensory Function: Impaired - see Bedside swallow  eval Self-Feeding  Abilities: Needs assist Patient Positioning: Upright in chair Volitional Cough: Strong Volitional Swallow: Able to elicit Anatomy: Within functional limits Pharyngeal Secretions: Not observed secondary MBS    Reason for Referral Objectively evaluate swallowing function   Oral Phase Oral Preparation/Oral Phase Oral Phase: Impaired Oral - Solids Oral - Mechanical Soft: Delayed oral transit   Pharyngeal Phase Pharyngeal Phase Pharyngeal Phase: Within functional limits (high penetration with  liquids from straw, ejected from laryn)  Cervical Esophageal Phase   Amanda L. Gunter, [place] CCC/SLP Pager [phone]               Carolan Shiver 04/29/2013, 10:35 AM     Microbiology: Recent Results (from the past 240 hour(s))  URINE CULTURE     Status: None   Collection Time  04/26/13 11:20 AM      Result Value Ref Range Status   Specimen Description URINE, CATHETERIZED   Final   Special Requests Normal   Final   Culture  Setup Time     Final   Value: 04/26/2013 12:09     Performed at [facility] Count     Final   Value: >=100,000 COLONIES/ML     Performed at [facility]   Culture     Final   Value: KLEBSIELLA PNEUMONIAE     Note: Confirmed Extended Spectrum Beta-Lactamase Producer (ESBL) CRITICAL RESULT CALLED TO, READ BACK BY AND VERIFIED WITH: IRBY AY 5:48 A.M. ON 04/29/13 WARRB MEROPENEM=SENSITIVE <=0.25     PSEUDOMONAS AERUGINOSA     Performed at [facility]   Report Status 04/30/2013 FINAL   Final   Organism ID, Bacteria KLEBSIELLA PNEUMONIAE   Final   Organism ID, Bacteria PSEUDOMONAS AERUGINOSA   Final  CULTURE, BLOOD (ROUTINE X 2)     Status: None   Collection Time    04/26/13 11:35 AM      Result Value Ref Range Status   Specimen Description BLOOD LEFT HAND   Final   Special Requests BOTTLES DRAWN AEROBIC AND ANAEROBIC 5CC   Final   Culture  Setup Time     Final   Value: 04/26/2013 16:17     Performed at [facility]   Culture     Final    Value: STAPHYLOCOCCUS SPECIES (COAGULASE NEGATIVE)     Note: THE SIGNIFICANCE OF ISOLATING THIS ORGANISM FROM A SINGLE SET OF BLOOD CULTURES WHEN MULTIPLE SETS ARE DRAWN IS UNCERTAIN. PLEASE NOTIFY THE MICROBIOLOGY DEPARTMENT WITHIN ONE WEEK IF SPECIATION AND SENSITIVITIES ARE REQUIRED.     Note: Gram Stain Report Called to,Read Back By and Verified With: MATT YORK 04/28/13 1239A [facility]     Performed at [facility]   Report Status 04/29/2013 FINAL   Final  CULTURE, BLOOD (ROUTINE X 2)     Status: None   Collection Time    04/26/13 11:45 AM      Result Value Ref Range Status   Specimen Description BLOOD LEFT ANTECUBITAL   Final   Special Requests BOTTLES DRAWN AEROBIC AND ANAEROBIC 5CC   Final   Culture  Setup Time     Final   Value: 04/26/2013 16:17     Performed at [facility]   Culture     Final   Value: NO GROWTH 5 DAYS     Performed at [facility]   Report Status 05/02/2013 FINAL   Final  CLOSTRIDIUM DIFFICILE BY PCR     Status: None   Collection Time    04/26/13  5:46 PM      Result Value Ref Range Status   C difficile by pcr NEGATIVE  NEGATIVE Final  MRSA PCR SCREENING     Status: None   Collection Time    04/26/13  6:28 PM      Result Value Ref Range Status   MRSA by PCR NEGATIVE  NEGATIVE Final   Comment:            The GeneXpert MRSA Assay (FDA     approved for NASAL specimens     only), is one component of a     comprehensive MRSA colonization     surveillance program. It is not     intended to diagnose MRSA     infection nor  to guide or     monitor treatment for     MRSA infections.  WOUND CULTURE     Status: None   Collection Time    05/01/13  5:00 AM      Result Value Ref Range Status   Specimen Description WOUND LEFT FOOT   Final   Special Requests NONE   Final   Gram Stain     Final   Value: NO WBC SEEN     RARE SQUAMOUS EPITHELIAL CELLS PRESENT     NO ORGANISMS SEEN     Performed at [facility]   Culture     Final    Value: Culture reincubated for better growth     Performed at [facility]   Report Status PENDING   Incomplete  WOUND CULTURE     Status: None   Collection Time    05/01/13  5:21 AM      Result Value Ref Range Status   Specimen Description WOUND RIGHT FOOT   Final   Special Requests NONE   Final   Gram Stain     Final   Value: RARE WBC PRESENT, PREDOMINANTLY PMN     RARE SQUAMOUS EPITHELIAL CELLS PRESENT     NO ORGANISMS SEEN     Performed at [facility]   Culture     Final   Value: FEW STAPHYLOCOCCUS AUREUS     Note: RIFAMPIN AND GENTAMICIN SHOULD NOT BE USED AS SINGLE DRUGS FOR TREATMENT OF STAPH INFECTIONS.     Performed at [facility]   Report Status PENDING   Incomplete     Labs: Basic Metabolic Panel:  Recent Labs Lab 04/27/13 0500 04/28/13 0430 04/29/13 0740 04/30/13 0437 05/01/13 0715 05/02/13 0450  NA 139 136* 136* 137 136* 138  K 3.9 3.9 4.2 4.2 4.4 3.7  CL 100 97 100 101 98 100  CO2 26 27 25 25 24 23   GLUCOSE 102* 113* 102* 70 56* 79  BUN 38* 28* 31* 25* 23 20  CREATININE 1.11 1.15 1.12 1.11 1.15 1.12  CALCIUM 9.5 9.1 9.2 9.2 9.5 9.5  MG 1.7 1.6 1.9  --   --  1.7  PHOS 4.5 4.5  --   --  3.6 3.8   Liver Function Tests:  Recent Labs Lab 04/27/13 0500 04/28/13 0430 05/01/13 0715  AST 217* 160* 86*  ALT 207* 186* 134*  ALKPHOS 79 76 89  BILITOT 0.3 <0.2* 0.3  PROT 7.3 6.6 8.0  ALBUMIN 1.9* 1.7* 1.9*   No results found for this basename: LIPASE, AMYLASE,  in the last 168 hours No results found for this basename: AMMONIA,  in the last 168 hours CBC:  Recent Labs Lab 04/27/13 0500 04/28/13 0430 04/29/13 0740 04/30/13 0437 05/01/13 0715 05/02/13 0450  WBC 14.5* 11.5* 11.9* 15.8* 15.7* 16.0*  NEUTROABS 9.2*  --   --   --   --   --   HGB 7.9* 7.2* 7.5* 7.5* 8.1* 7.5*  HCT 25.1* 22.7* 23.8* 23.2* 25.2* 23.7*  MCV 78.9 77.2* 77.5* 76.6* 76.6* 76.2*  PLT 214 219 258 249 302 325   Cardiac Enzymes: No results found  for this basename: CKTOTAL, CKMB, CKMBINDEX, TROPONINI,  in the last 168 hours BNP: BNP (last 3 results)  Recent Labs  04/26/13 1135  PROBNP 525.4*   CBG:  Recent Labs Lab 05/02/13 2030 05/03/13 0018 05/03/13 0611 05/03/13 0751 05/03/13 1157  GLUCAP 75 87 111* 105* 111*  Signed:  Clydia Llano  [facility] 05/03/2013, 12:34 PM

## 2013-05-04 LAB — WOUND CULTURE: Gram Stain: NONE SEEN

## 2013-05-04 NOTE — Clinical Social Work Psychosocial (Signed)
Clinical Social Work Department BRIEF PSYCHOSOCIAL ASSESSMENT 05/04/2013  Patient:  Craig [name], Craig [name]     Account Number:  [id number]     Admit date:  04/26/2013  Clinical Social Worker:  Lovey Newcomer  Date/Time:  05/03/2013 10:00 AM  Referred by:  Physician  Date Referred:  05/03/2013 Referred for  SNF Placement   Other Referral:   Interview type:  Family Other interview type:   Patient not completely oriented at time of assessment. CSW finally able to contact family (sister [place]) and friend Mr. Chana Bode.    PSYCHOSOCIAL DATA Living Status:  FACILITY Admitted from facility:  [place] Level of care:  [place] Primary support name:  Mr. Chana Bode Primary support relationship to patient:  FRIEND Degree of support available:   Support is limited. Patient has a long time friend that assists with his affairs.    CURRENT CONCERNS Current Concerns  Post-Acute Placement   Other Concerns:    SOCIAL WORK ASSESSMENT / PLAN CSW was given contact information for sister by RN. CSW contacted patient's sister [place]tar Krahenbuhl and friend Mr. Chana Bode to complete assessment. [place] and Mr. Chana Bode confirm that patient is from [place]. Both plan for patient to go back to facility at discharge. Mr. Chana Bode states that patient is not really close with any of his family. Patient has brother (Mr. Tyrone Nine) and sister ([place]tar). Gibraltar lives in [place] and brother lives in [place]. Mr. Chana Bode states that patient also has a mother who is in a SNF in [place]. Mr. Chana Bode states that the patient has had a long and "rough" history in and out of prison and that the family has kind of "written him off." Mr. Chana Bode states that patient was at [facility] in the past and that the care was not adequate at this facility, that's when Mr. Chana Bode got the patient placed at [facility]. Mr. Chana Bode and [place] are happy  to know that the patient will be able to return to [facility] at discharge.   Assessment/plan status:  Psychosocial Support/Ongoing Assessment of Needs Other assessment/ plan:   Complete Fl2, Fax   Information/referral to community resources:   CSW contact information given to [place] and Mr. Chana Bode    PATIENT'S/FAMILY'S RESPONSE TO PLAN OF CARE: Patient's sister and friend plan for patient to return to [facility] at discharge. CSW met with patient and Mr. Chana Bode at bedside to explain that patient will be discharged to [facility] today. Patient verbalizes minimally, but shook his head and said yes to the plan. CSW will assist with DC.       Liz Beach MSW, [place], [place], [phone]

## 2013-05-10 ENCOUNTER — Non-Acute Institutional Stay (SKILLED_NURSING_FACILITY): Payer: Medicare Other | Admitting: Internal Medicine

## 2013-05-10 ENCOUNTER — Encounter: Payer: Self-pay | Admitting: Internal Medicine

## 2013-05-10 DIAGNOSIS — J189 Pneumonia, unspecified organism: Secondary | ICD-10-CM

## 2013-05-10 DIAGNOSIS — R197 Diarrhea, unspecified: Secondary | ICD-10-CM

## 2013-05-10 DIAGNOSIS — L89109 Pressure ulcer of unspecified part of back, unspecified stage: Secondary | ICD-10-CM

## 2013-05-10 DIAGNOSIS — L8994 Pressure ulcer of unspecified site, stage 4: Secondary | ICD-10-CM

## 2013-05-10 DIAGNOSIS — L89154 Pressure ulcer of sacral region, stage 4: Secondary | ICD-10-CM

## 2013-05-10 NOTE — Progress Notes (Signed)
Patient ID: Craig [name], male   DOB: 1945-11-02, 68 y.o.   MRN: [id number]  [facility] Provider:  Gwenith Spitziffany L. Renato Gailseed, D.O., C.M.D.  Code Status:  Full code  Chief Complaint  Patient presents with  . Acute Visit    hyperkalemia, persistent leukocytosis and anemia on labs    HPI:  68 yo black male with h/o recurrent pneumonia with sepsis, severe debility, diverticulosis s/p colostomy, dysphagia, hepatitis B, COPD, afib, htn, hypothyroidism, severe protein calorie malnutrition on TPN, stage IV sacral decubitus ulcer and latest hospitalization with hypoxia, hemoptysis, and tachycardia where he was found to have a "UTI".  His TPN was discontinued and he is on now tube feedings.  He still has an IV in place, but has completed his IV antibiotics for the "UTI".    Review of Systems:  Review of Systems  Constitutional: Negative for fever.  HENT: Negative for congestion.   Eyes: Negative for blurred vision.  Respiratory: Positive for cough and shortness of breath.   Cardiovascular: Negative for chest pain.  Gastrointestinal: Positive for abdominal pain and diarrhea. Negative for nausea, vomiting, constipation, blood in stool and melena.  Genitourinary: Negative for dysuria.  Musculoskeletal: Negative for falls and myalgias.  Skin: Negative for rash.  Neurological: Negative for dizziness.  Psychiatric/Behavioral: Positive for depression.    Medications: Patient's Medications  New Prescriptions   No medications on file  Previous Medications   AMIODARONE (PACERONE) 200 MG TABLET    Take 200 mg by mouth daily.   ASPIRIN 81 MG TABLET    Take 81 mg by mouth daily.   CARBOMER GEL BASE (HYDROGEL) GEL    1 application by Does not apply route every other day. To left ankle   CARBOMER GEL BASE (HYDROGEL) GEL    Apply 1 application topically every other day. To right ankle   CEFTAZIDIME 1 G IN DEXTROSE 5 % 50 ML    Inject 1 g into the vein every 8 (eight) hours.   COLLAGENASE (SANTYL) OINTMENT    Apply 1 application topically 3 (three) times daily. To right plantar surface   [employer] CALCIUM ALGINATE 4"X4" EX    Apply 1 application topically. To left heel   ESCITALOPRAM (LEXAPRO) 10 MG TABLET    Take 10 mg by mouth daily.   FLUTICASONE FUROATE-VILANTEROL (BREO ELLIPTA) 100-25 MCG/INH AEPB    Inhale 1 puff into the lungs daily.    FOLIC ACID (FOLVITE) 1 MG TABLET    Take 1 mg by mouth daily.   IMIPENEM-CILASTATIN 250 MG IN SODIUM CHLORIDE 0.9 % 100 ML    Inject 250 mg into the vein every 6 (six) hours.   K PHOS MONO-SOD PHOS DI & MONO (PHOSPHA 250 NEUTRAL PO)    Take 1 capsule by mouth 2 (two) times daily.   LACTULOSE (CHRONULAC) 10 GM/15ML SOLUTION    Take 20 g by mouth daily.   LANSOPRAZOLE (PREVACID SOLUTAB) 30 MG DISINTEGRATING TABLET    Take 30 mg by mouth daily.   LEVOTHYROXINE (SYNTHROID, LEVOTHROID) 150 MCG TABLET    Take 150 mcg by mouth daily before breakfast.   MELATONIN 3 MG TABS    Take 1 tablet by mouth daily.   METOCLOPRAMIDE (REGLAN) 5 MG/5ML SOLUTION    Take 5 mg by mouth every 6 (six) hours as needed for nausea.   MORPHINE (ROXANOL) 20 MG/ML CONCENTRATED SOLUTION    Give 0.85ml per G Tube once daily 30 minutes before dressing changes; Give 0.3925ml per  G Tube every 6 hours for pain   MULTIPLE VITAMINS-MINERALS (DECUBI-VITE PO)    Take 1 capsule by mouth daily.   POTASSIUM CHLORIDE (KLOR-CON) 20 MEQ PACKET    20 mEq by PEG Tube route 2 (two) times daily.   THIAMINE 100 MG TABLET    Take 100 mg by mouth daily.  Modified Medications   No medications on file  Discontinued Medications   No medications on file    Physical Exam: Filed Vitals:   05/10/13 1541  BP: 125/63  Pulse: 94  Temp: 97.6 F (36.4 C)  Resp: 20  Height: 5\' 10"  (1.778 m)  Weight: 143 lb (64.864 kg)  SpO2: 91%  Physical Exam  Cardiovascular: Normal rate, regular rhythm and normal heart sounds.   Pulmonary/Chest: Effort normal.  Some coarse rhonchi  Abdominal: Soft.  Bowel sounds are normal.  Musculoskeletal: Normal range of motion.  Skin: Skin is warm and dry.  Stage IV sacral pressure ulcer present    Labs reviewed: Basic Metabolic Panel:  Recent Labs  62/13/0804/16/15 0430 04/29/13 0740 04/30/13 0437 05/01/13 0715 05/02/13 0450  NA 136* 136* 137 136* 138  K 3.9 4.2 4.2 4.4 3.7  CL 97 100 101 98 100  CO2 27 25 25 24 23   GLUCOSE 113* 102* 70 56* 79  BUN 28* 31* 25* 23 20  CREATININE 1.15 1.12 1.11 1.15 1.12  CALCIUM 9.1 9.2 9.2 9.5 9.5  MG 1.6 1.9  --   --  1.7  PHOS 4.5  --   --  3.6 3.8    Liver Function Tests:  Recent Labs  04/27/13 0500 04/28/13 0430 05/01/13 0715  AST 217* 160* 86*  ALT 207* 186* 134*  ALKPHOS 79 76 89  BILITOT 0.3 <0.2* 0.3  PROT 7.3 6.6 8.0  ALBUMIN 1.9* 1.7* 1.9*    CBC:  Recent Labs  04/26/13 1135 04/27/13 0500  04/30/13 0437 05/01/13 0715 05/02/13 0450  WBC 16.9* 14.5*  < > 15.8* 15.7* 16.0*  NEUTROABS 12.0* 9.2*  --   --   --   --   HGB 9.8* 7.9*  < > 7.5* 8.1* 7.5*  HCT 30.5* 25.1*  < > 23.2* 25.2* 23.7*  MCV 78.8 78.9  < > 76.6* 76.6* 76.2*  PLT 284 214  < > 249 302 325  < > = values in this interval not displayed. 05/09/13:  Wbc 14, h/h 7.6/24.5, plts 444;  Na 137, K 5.7, BUN 20, cr 0.86, alb 2.4, phos 3.4, mag 1.5, TG 203, prealbumin 18.9  Significant Diagnostic Results:  04/25/13 CXR PA and lateral:  Basilar findings are new compared to 04/18/13 (right lower lobe atelectasis, focal subsegmental opacity at right base)  Assessment/Plan 1. Diarrhea -suspect this is from abx -r/o cdiff:  Check cdiff pcr -d/c midline from abx for "UTI" (suspect actually had hcap) -start florastor  2. Sacral decubitus ulcer, stage IV -cont wound care here  3. HCAP (healthcare-associated pneumonia) -completed abx said to be for UTI, but pt actually had pneumonia symptoms and will d/c midline catheter   Family/ staff Communication: seen with unit supervisor Goals of care: long term car resident, but his  personal goal is still to return home if he can improve enough  Labs/tests ordered:  c diff pcr

## 2013-05-11 ENCOUNTER — Other Ambulatory Visit: Payer: Self-pay | Admitting: *Deleted

## 2013-05-11 MED ORDER — MORPHINE SULFATE (CONCENTRATE) 20 MG/ML PO SOLN: ORAL | Status: DC

## 2013-05-11 NOTE — Telephone Encounter (Signed)
[facility] [place] 

## 2013-05-13 ENCOUNTER — Emergency Department (HOSPITAL_COMMUNITY): Payer: Medicare Other

## 2013-05-13 ENCOUNTER — Encounter (HOSPITAL_COMMUNITY): Payer: Self-pay | Admitting: Emergency Medicine

## 2013-05-13 ENCOUNTER — Emergency Department (HOSPITAL_COMMUNITY)
Admission: EM | Admit: 2013-05-13 | Discharge: 2013-05-13 | Disposition: A | Discharge status: Home / Self Care | Payer: Medicare Other | Attending: Emergency Medicine | Admitting: Emergency Medicine

## 2013-05-13 ENCOUNTER — Non-Acute Institutional Stay (SKILLED_NURSING_FACILITY): Payer: Medicare Other | Admitting: Internal Medicine

## 2013-05-13 DIAGNOSIS — J449 Chronic obstructive pulmonary disease, unspecified: Secondary | ICD-10-CM | POA: Insufficient documentation

## 2013-05-13 DIAGNOSIS — R143 Flatulence: Secondary | ICD-10-CM

## 2013-05-13 DIAGNOSIS — R141 Gas pain: Secondary | ICD-10-CM | POA: Insufficient documentation

## 2013-05-13 DIAGNOSIS — K9423 Gastrostomy malfunction: Secondary | ICD-10-CM

## 2013-05-13 DIAGNOSIS — I10 Essential (primary) hypertension: Secondary | ICD-10-CM | POA: Insufficient documentation

## 2013-05-13 DIAGNOSIS — K219 Gastro-esophageal reflux disease without esophagitis: Secondary | ICD-10-CM | POA: Insufficient documentation

## 2013-05-13 DIAGNOSIS — I252 Old myocardial infarction: Secondary | ICD-10-CM | POA: Insufficient documentation

## 2013-05-13 DIAGNOSIS — R142 Eructation: Secondary | ICD-10-CM

## 2013-05-13 DIAGNOSIS — Z79899 Other long term (current) drug therapy: Secondary | ICD-10-CM | POA: Insufficient documentation

## 2013-05-13 DIAGNOSIS — R140 Abdominal distension (gaseous): Secondary | ICD-10-CM

## 2013-05-13 DIAGNOSIS — Z87891 Personal history of nicotine dependence: Secondary | ICD-10-CM | POA: Insufficient documentation

## 2013-05-13 DIAGNOSIS — J4489 Other specified chronic obstructive pulmonary disease: Secondary | ICD-10-CM | POA: Insufficient documentation

## 2013-05-13 DIAGNOSIS — Z7982 Long term (current) use of aspirin: Secondary | ICD-10-CM | POA: Insufficient documentation

## 2013-05-13 NOTE — ED Notes (Signed)
Pt sleeping, awakens easily.  Unsure of issue with G tube.  Remains in place, colostomy bag attached.  Stool noted to right side of stoma dressing, some stool in bag.

## 2013-05-13 NOTE — ED Notes (Signed)
[facility]d [facility], facility also reports "peg tube is split" pt axo x4, denies any pain. Foley in placed, colostomy bag in place.

## 2013-05-13 NOTE — Progress Notes (Signed)
Patient ID: Craig GuerinWilliam [name], male   DOB: 1945-01-24, 68 y.o.   MRN: [id number]    [facility]  Chief Complaint  Patient presents with  . Acute Visit    removal of midline   No Known Allergies  HPI 68 y.o. male patient is seen today for fractured peg tube. He has hx of severe malnutrition and pressure ulcer and has picc line and peg tube. This was noted by staff in the morning today. Patient also has abdominal distension with discomfort. He has a colostomy bag in place. The patient denies abdominal pain, nausea, vomiting. No fever or chills reported.  Review of Systems  Constitutional: Negative for fever and chills.  Respiratory: Negative for cough and shortness of breath.   Cardiovascular: Negative for chest pain.  Gastrointestinal: Negative for nausea and vomiting.  Genitourinary: Negative for dysuria.  Neurological: Negative for headaches.  Psychiatric/Behavioral: Negative for behavioral problems.   Past Medical History  Diagnosis Date  . COPD (chronic obstructive pulmonary disease)   . Hypertension   . Myocardial infarction   . A-fib   . GERD (gastroesophageal reflux disease)    [facility]  Physical exam Vital signs stable, afebrile  Constitutional: He is oriented to person, place, and time. No distress. Thin built HENT:  Head: Normocephalic and atraumatic.   Mouth/Throat: No oropharyngeal exudate.  Eyes: Conjunctivae are normal. Pupils are equal, round, and reactive to light. No scleral icterus.  Neck: Normal range of motion. No tracheal deviation present. No thyromegaly present.  Cardiovascular: Normal rate, regular rhythm and normal heart sounds. No murmur heard. Pulmonary/Chest: Effort normal and breath sounds normal. No stridor. No respiratory distress. He has no wheezes. He has no rales. Abdominal: Soft. Abdominal distension present, non tedner, no palpable mass. colostomy site in place and clean. Peg tube fractured with feed leaking  out.  Musculoskeletal: Normal range of motion. He exhibits no edema.  Neurological: He is alert and oriented to person, place, and time.  Skin: Skin is warm and dry. He is not diaphoretic.   Assessment/plan  Fractured peg tube- new and facility unable to get patient in for peg tube replacement with IR today. Will send to ER to help with peg tube replacement  Abdominal distension- new, but no tenderness or finding of acute abdomen at present. As patient is being sent to ED for peg tube replacement, will have abdominal xray to assess for gaseous distension vs obstruction

## 2013-05-13 NOTE — Discharge Instructions (Signed)
Care of a Feeding Tube °People who have trouble swallowing or cannot take food or medicine by mouth are sometimes given feeding tubes. A feeding tube can go into the nose and down to the stomach or through the skin in the abdomen and into the stomach or small bowel. Some of the names of these feeding tubes are gastrostomy tubes, PEG lines, nasogastric tubes, and gastrojejunostomy tubes.  °SUPPLIES NEEDED TO CARE FOR THE TUBE SITE °· Clean gloves. °· Clean wash cloth, gauze pads, or soft paper towel. °· Cotton swabs. °· Skin barrier ointment or cream. °· Soap and water. °· Pre-cut foam pads or gauze (that go around the tube). °· Tube tape. °TUBE SITE CARE °1. Have all supplies ready and available. °2. Wash hands well. °3. Put on clean gloves. °4. Remove the soiled foam pad or gauze, if present, that is found under the tube stabilizer. Change the foam pad or gauze daily or when soiled or moist. °5. Check the skin around the tube site for redness, rash, swelling, drainage, or extra tissue growth. If you notice any of these, call your caregiver. °6. Moisten gauze and cotton swabs with water and soap. °7. Wipe the area closest to the tube (right near the stoma) with cotton swabs. Wipe the surrounding skin with moistened gauze. Rinse with water. °8. Dry the skin and stoma site with a dry gauze pad or soft paper towel. Do not use antibiotic ointments at the tube site. °9. If the skin is red, apply a skin barrier cream or ointment (such as petroleum jelly) in a circular motion, using a cotton swab. The cream or ointment will provide a moisture barrier for the skin and helps with wound healing. °10. Apply a new pre-cut foam pad or gauze around the tube. Secure it with tape around the edges. If no drainage is present, foam pads or gauze may be left off. °11. Use tape or an anchoring device to fasten the feeding tube to the skin for comfort or as directed. Rotate where you tape the tube to avoid skin damage from the  adhesive. °12. Position the person in a semi-upright position (30 45 degree angle). °13. Throw away used supplies. °14. Remove gloves. °15. Wash hands. °SUPPLIES NEEDED TO FLUSH A FEEDING TUBE °· Clean gloves. °· 60 mL syringe (that connects to the feeding tube). °· Towel. °· Water. °FLUSHING A FEEDING TUBE  °1. Have all supplies ready and available. °2. Wash hands well. °3. Put on clean gloves. °4. Draw up 30 mL of water in the syringe. °5. Kink the feeding tube while disconnecting it from the feeding-bag tubing or while removing the plug at the end of the tube. Kinking closes the tube and prevents secretions in the tube from spilling out. °6. Insert the tip of the syringe into the end of the feeding tube. Release the kink. Slowly inject the water. °7. If unable to inject the water, the person with the feeding tube should lay on his or her left side. The tip of the tube may be against the stomach wall, blocking fluid flow. Changing positions may move the tip away from the stomach wall. After repositioning, try injecting the water again. °· Do not use a syringe smaller than 60 mL to flush the tubing. °· Do not use excessive force to overcome resistance because this could cause the tube to rupture. °8. After injecting the water, remove the syringe. °9. Always flush before giving the first medicine, between medicines, and after the final   medicine before starting a feeding. This prevents medicines from clogging the tube. °· Do not mix medicines with formula or with other medicines before giving medicines. °· Thoroughly flush medicines through the tube so they do not mix with formula. °10. Throw away used supplies. °11. Remove gloves. °12. Wash hands. °Document Released: 12/30/2004 Document Revised: 12/17/2011 Document Reviewed: 08/14/2011 °[employer]® Patient Information ©2014 [employer]. ° °

## 2013-05-13 NOTE — ED Provider Notes (Signed)
CSN: [id number]     Arrival date & time 05/13/13  1744 History   First MD Initiated Contact with Patient 05/13/13 1936     Chief Complaint  Patient presents with  . peg tube problem    . abdominal distention      (Consider location/radiation/quality/duration/timing/severity/associated sxs/prior Treatment) HPI  This is a 68 y.o. male with PMH of COPD, hypertension, MI, atrial fibrillation, sacral ulcer status post GJ tube, PICC line for malnourishment, presenting with fractured GJ tube.  The patient states that this occurred 3 days ago, although mechanism is unknown. Occurred at [facility].  Facility his been wrapping defects with tape successfully. Supposedly, they noticed that his abdomen was distended today, which prompted them to transported him here for further care. The patient denies distention, abdominal pain, nausea, vomiting, change in ostomy output. No new meds taken. Feeds been going well, without residual. Negative for fever. Negative for change in breathing.  Past Medical History  Diagnosis Date  . COPD (chronic obstructive pulmonary disease)   . Hypertension   . Myocardial infarction   . A-fib   . GERD (gastroesophageal reflux disease)    History reviewed. No pertinent past surgical history. No family history on file. History  Substance Use Topics  . Smoking status: Former Games developer  . Smokeless tobacco: Not on file  . Alcohol Use: No    Review of Systems  Constitutional: Negative for fever and chills.  HENT: Negative for facial swelling.   Eyes: Negative for pain and visual disturbance.  Respiratory: Negative for chest tightness and shortness of breath.   Cardiovascular: Negative for chest pain.  Gastrointestinal: Negative for nausea and vomiting.  Genitourinary: Negative for dysuria.  Musculoskeletal: Negative for arthralgias and myalgias.  Neurological: Negative for headaches.  Psychiatric/Behavioral: Negative for behavioral problems.      Allergies   Review of patient's allergies indicates no known allergies.  Home Medications   Prior to Admission medications   Medication Sig Start Date End Date Taking? Authorizing Provider  amiodarone (PACERONE) 200 MG tablet Take 200 mg by mouth daily.   Yes Historical Provider, MD  aspirin 81 MG tablet Take 81 mg by mouth daily.   Yes Historical Provider, MD  Carbomer Gel Base (HYDROGEL) GEL 1 application by Does not apply route every other day. To left ankle   Yes Historical Provider, MD  Carbomer Gel Base (HYDROGEL) GEL Apply 1 application topically every other day. To right ankle   Yes Historical Provider, MD  collagenase (SANTYL) ointment Apply 1 application topically 3 (three) times daily. To right plantar surface   Yes Historical Provider, MD  [employer]S CALCIUM ALGINATE 4"X4" EX Apply 1 application topically. To left heel   Yes Historical Provider, MD  escitalopram (LEXAPRO) 10 MG tablet Take 10 mg by mouth daily.   Yes Historical Provider, MD  Fluticasone Furoate-Vilanterol (BREO ELLIPTA) 100-25 MCG/INH AEPB Inhale 1 puff into the lungs daily.    Yes Historical Provider, MD  folic acid (FOLVITE) 1 MG tablet Take 1 mg by mouth daily.   Yes Historical Provider, MD  K Phos Mono-Sod Phos Di & Mono (PHOSPHA 250 NEUTRAL PO) Take 1 capsule by mouth 2 (two) times daily.   Yes Historical Provider, MD  lactulose (CHRONULAC) 10 GM/15ML solution Take 20 g by mouth daily.   Yes Historical Provider, MD  lansoprazole (PREVACID SOLUTAB) 30 MG disintegrating tablet Take 30 mg by mouth daily.   Yes Historical Provider, MD  lansoprazole (PREVACID) 30 MG capsule Take 30 mg  by mouth daily at 12 noon.   Yes Historical Provider, MD  levothyroxine (SYNTHROID, LEVOTHROID) 150 MCG tablet Take 150 mcg by mouth daily before breakfast.   Yes Historical Provider, MD  Melatonin 3 MG TABS Take 1 tablet by mouth daily.   Yes Historical Provider, MD  metoCLOPramide (REGLAN) 5 MG/5ML solution Take 5 mg by mouth every 6 (six) hours as  needed for nausea.   Yes Historical Provider, MD  morphine (ROXANOL) 20 MG/ML concentrated solution Give 0.885ml per G Tube once daily before dressing changes; Give 0.1325ml per G Tube every 6 hours as needed for pain 05/11/13  Yes Kimber RelicArthur G Green, MD  Multiple Vitamins-Minerals (DECUBI-VITE PO) Take 1 capsule by mouth daily.   Yes Historical Provider, MD  potassium chloride (KLOR-CON) 20 MEQ packet 20 mEq by PEG Tube route 2 (two) times daily.   Yes Historical Provider, MD  saccharomyces boulardii (FLORASTOR) 250 MG capsule Take 250 mg by mouth 2 (two) times daily.   Yes Historical Provider, MD  thiamine 100 MG tablet Take 100 mg by mouth daily.   Yes Historical Provider, MD   BP 115/54  Pulse 96  Temp(Src) 99.2 F (37.3 C) (Rectal)  Resp 15  SpO2 92% Physical Exam  Constitutional: He is oriented to person, place, and time. No distress.  Thin  HENT:  Head: Normocephalic and atraumatic.  Mouth/Throat: No oropharyngeal exudate.  Eyes: Conjunctivae are normal. Pupils are equal, round, and reactive to light. No scleral icterus.  Neck: Normal range of motion. No tracheal deviation present. No thyromegaly present.  Cardiovascular: Normal rate, regular rhythm and normal heart sounds.  Exam reveals no gallop and no friction rub.   No murmur heard. Pulmonary/Chest: Effort normal and breath sounds normal. No stridor. No respiratory distress. He has no wheezes. He has no rales. He exhibits no tenderness.  Abdominal: Soft. He exhibits no distension and no mass. There is no tenderness. There is no rebound and no guarding.  Junction of skin and GJ tube intact, clean, dry, without signs of infection  The external tube between the junction of the skin and the ports is fractured, but well repaired with tape. Upon removing the tape, fracture is minimal  Colostomy site clean, dry, intact, without signs of infection  Musculoskeletal: Normal range of motion. He exhibits no edema.  Neurological: He is alert and  oriented to person, place, and time.  Skin: Skin is warm and dry. He is not diaphoretic.    ED Course  Procedures (including critical care time) Labs Review Labs Reviewed - No data to display  Imaging Review Dg Abd 1 View  05/13/2013   CLINICAL DATA:  J PEG tube.  EXAM: ABDOMEN - 1 VIEW  COMPARISON:  04/27/2013.  FINDINGS: The examination is significantly limited by pronounced grid lines. Normal bowel gas pattern. The left mid abdominal enteric tube extends superiorly and across the midline with its tip poorly visualized in the right mid to upper abdomen. Lumbar spine degenerative changes.  IMPRESSION: No acute abnormality. Left enteric tube tip most likely in the second portion of the duodenum.   Electronically Signed   By: Gordan PaymentSteve  Reid M.D.   On: 05/13/2013 19:38      MDM   Final diagnoses:  PEG tube malfunction    This is a 68 y.o. male with PMH of COPD, hypertension, MI, atrial fibrillation, sacral ulcer status post GJ tube, PICC line for malnourishment, presenting with fractured GJ tube.  The patient states that this occurred 3  days ago, although mechanism is unknown. [facility]  Facility his been wrapping defects with tape successfully. Supposedly, they noticed that his abdomen was distended today, which prompted them to transported him here for further care. The patient denies distention, abdominal pain, nausea, vomiting, change in ostomy output. No new meds taken. Feeds been going well, without residual. Negative for fever. Negative for change in breathing.  Vitals are normal. Patient has no new complaints. His abdomen is without tenderness to palpation, rebound, rigidity, or guarding. The GJ tube is fractured externally, but still functional with tape in place. I've spoken with Dr. Loreta AveMann with gastroenterology. She states that replacing the tube is not of emergent importance. I agree with this. Upon chart review, the tube was [facility] by [facility] D[facility]omas Mut[facility]  I've contacted Mr. Henriette CombsJohnson's facility and [facility].  I've discussed his plan of care, and recommended followup with Dr. Lita MainsMuton.  She expresses understanding and is agreeable to the plan. The patient also is agreeable to the plan.  Pt stable for discharge, FU.  All questions answered.  Return precautions given.  I have discussed case and care has been guided by my attending physician, Dr. Radford PaxBeaton.  Loma BostonStirling Ginnifer Creelman, MD 05/14/13 [phone]

## 2013-05-13 NOTE — ED Notes (Signed)
[facility]  [facility] to [place], [facility]

## 2013-05-14 NOTE — ED Provider Notes (Signed)
I saw and discussed the patient with the resident, reviewed the resident's note and I agree with the findings and plan   .Face to face Exam:  General:  Awake HEENT:  Atraumatic Resp:  Normal effort Abd:  Nondistended Neuro:No focal weakness   Nelia Shiobert L Ger Ringenberg, MD 05/14/13 1100

## 2013-05-31 ENCOUNTER — Other Ambulatory Visit: Payer: Self-pay | Admitting: Internal Medicine

## 2013-05-31 ENCOUNTER — Non-Acute Institutional Stay (SKILLED_NURSING_FACILITY): Payer: Medicare Other | Admitting: Internal Medicine

## 2013-05-31 ENCOUNTER — Encounter: Payer: Self-pay | Admitting: Internal Medicine

## 2013-05-31 DIAGNOSIS — L89154 Pressure ulcer of sacral region, stage 4: Secondary | ICD-10-CM

## 2013-05-31 DIAGNOSIS — L8994 Pressure ulcer of unspecified site, stage 4: Secondary | ICD-10-CM

## 2013-05-31 DIAGNOSIS — R633 Feeding difficulties, unspecified: Secondary | ICD-10-CM

## 2013-05-31 DIAGNOSIS — R5381 Other malaise: Secondary | ICD-10-CM

## 2013-05-31 DIAGNOSIS — J690 Pneumonitis due to inhalation of food and vomit: Secondary | ICD-10-CM

## 2013-05-31 DIAGNOSIS — L89109 Pressure ulcer of unspecified part of back, unspecified stage: Secondary | ICD-10-CM

## 2013-05-31 DIAGNOSIS — E43 Unspecified severe protein-calorie malnutrition: Secondary | ICD-10-CM

## 2013-05-31 NOTE — Progress Notes (Signed)
Patient ID: Craig [name], male   DOB: 10-13-1945, 68 y.o.   MRN: [id number]  [facility]  [facility] Provider:  Gwenith Spitziffany L. Renato Gailseed, D.O., C.M.D.  Code Status:  Full code  Chief Complaint  Patient presents with  . Acute Visit    cough productive of yellow green sputum, has had CXR ordered    HPI:  68 yo chronically ill black male with h/o recurrent pneumonias, recent sepsis, COPD, GERD, afib on amiodarone, hypothyroidism, stage IV sacral decubitus ulcer, failure to thrive and severe protein calorie malnutrition was seen for acute visit due to another episode of cough productive of yellow green sputum.  The on call provider has ordered a CXR for him.  He has a feeding tube and is receiving osmalite and propass supplements.  His wounds are being cared for by the treatment nurse and wound care physician [facility]    Review of Systems:  Review of Systems  Constitutional: Positive for malaise/fatigue. Negative for fever and chills.  Eyes: Negative for blurred vision.  Respiratory: Positive for cough and sputum production. Negative for shortness of breath.        Some bloody sputum per nursing staff  Cardiovascular: Negative for chest pain and leg swelling.  Gastrointestinal: Negative for constipation.  Genitourinary: Negative for dysuria.  Musculoskeletal: Negative for falls.  Skin: Negative for rash.       Sacral and ankle/heel decubiti  Neurological: Positive for weakness. Negative for dizziness and loss of consciousness.  Psychiatric/Behavioral:       Seems annoyed and does not want to be bothered    Medications: Patient's Medications  New Prescriptions   No medications on file  Previous Medications   AMIODARONE (PACERONE) 200 MG TABLET    Take 200 mg by mouth daily.   ASPIRIN 81 MG TABLET    Take 81 mg by mouth daily.   CARBOMER GEL BASE (HYDROGEL) GEL    1 application by Does not apply route every other day. To left ankle   CARBOMER GEL BASE (HYDROGEL)  GEL    Apply 1 application topically every other day. To right ankle   COLLAGENASE (SANTYL) OINTMENT    Apply 1 application topically 3 (three) times daily. To right plantar surface   [employer]S CALCIUM ALGINATE 4"X4" EX    Apply 1 application topically. To left heel   ESCITALOPRAM (LEXAPRO) 10 MG TABLET    Take 10 mg by mouth daily.   FLUTICASONE FUROATE-VILANTEROL (BREO ELLIPTA) 100-25 MCG/INH AEPB    Inhale 1 puff into the lungs daily.    FOLIC ACID (FOLVITE) 1 MG TABLET    Take 1 mg by mouth daily.   K PHOS MONO-SOD PHOS DI & MONO (PHOSPHA 250 NEUTRAL PO)    Take 1 capsule by mouth 2 (two) times daily.   LACTULOSE (CHRONULAC) 10 GM/15ML SOLUTION    Take 20 g by mouth daily.   LANSOPRAZOLE (PREVACID SOLUTAB) 30 MG DISINTEGRATING TABLET    Take 30 mg by mouth daily.   LEVOTHYROXINE (SYNTHROID, LEVOTHROID) 150 MCG TABLET    Take 150 mcg by mouth daily before breakfast.   MELATONIN 3 MG TABS    Take 1 tablet by mouth daily.   METOCLOPRAMIDE (REGLAN) 5 MG/5ML SOLUTION    Take 5 mg by mouth every 6 (six) hours as needed for nausea.   MORPHINE (ROXANOL) 20 MG/ML CONCENTRATED SOLUTION    Give 0.765ml per G Tube once daily before dressing changes; Give 0.3025ml per G Tube every 6  hours as needed for pain   MULTIPLE VITAMINS-MINERALS (DECUBI-VITE PO)    Take 1 capsule by mouth daily.   POTASSIUM CHLORIDE (KLOR-CON) 20 MEQ PACKET    20 mEq by PEG Tube route 2 (two) times daily.   THIAMINE 100 MG TABLET    Take 100 mg by mouth daily.  Modified Medications   No medications on file  Discontinued Medications   LANSOPRAZOLE (PREVACID) 30 MG CAPSULE    Take 30 mg by mouth daily at 12 noon.   SACCHAROMYCES BOULARDII (FLORASTOR) 250 MG CAPSULE    Take 250 mg by mouth 2 (two) times daily.    Physical Exam: Filed Vitals:   05/31/13 0934  BP: 126/68  Pulse: 79  Temp: 97.8 F (36.6 C)  Resp: 20  Height: 5\' 10"  (1.778 m)  Weight: 143 lb (64.864 kg)  SpO2: 96%  Physical Exam  Constitutional:  Increasingly  cachectic black male resting in bed, receiving tube feedings  HENT:  Head: Normocephalic and atraumatic.  Cardiovascular: Normal rate, regular rhythm, normal heart sounds and intact distal pulses.   Pulmonary/Chest: Effort normal.  Diminished breath sounds left base, coughing up green sputum in small basin on bedside table  Abdominal: Soft. Bowel sounds are normal. He exhibits no distension and no mass. There is no tenderness.  PEG in place  Musculoskeletal: He exhibits no edema.  Skin:  Stage IV sacral pressure ulcer, also ulcers of heels, ankles bilateral feet dressed with kerlix at present  Psychiatric:  Flat affect--always states, "I just want to go home"    Labs reviewed: Basic Metabolic Panel:  Recent Labs  16/10/9602/16/15 0430 04/29/13 0740 04/30/13 0437 05/01/13 0715 05/02/13 0450  NA 136* 136* 137 136* 138  K 3.9 4.2 4.2 4.4 3.7  CL 97 100 101 98 100  CO2 27 25 25 24 23   GLUCOSE 113* 102* 70 56* 79  BUN 28* 31* 25* 23 20  CREATININE 1.15 1.12 1.11 1.15 1.12  CALCIUM 9.1 9.2 9.2 9.5 9.5  MG 1.6 1.9  --   --  1.7  PHOS 4.5  --   --  3.6 3.8    Liver Function Tests:  Recent Labs  04/27/13 0500 04/28/13 0430 05/01/13 0715  AST 217* 160* 86*  ALT 207* 186* 134*  ALKPHOS 79 76 89  BILITOT 0.3 <0.2* 0.3  PROT 7.3 6.6 8.0  ALBUMIN 1.9* 1.7* 1.9*    CBC:  Recent Labs  04/26/13 1135 04/27/13 0500  04/30/13 0437 05/01/13 0715 05/02/13 0450  WBC 16.9* 14.5*  < > 15.8* 15.7* 16.0*  NEUTROABS 12.0* 9.2*  --   --   --   --   HGB 9.8* 7.9*  < > 7.5* 8.1* 7.5*  HCT 30.5* 25.1*  < > 23.2* 25.2* 23.7*  MCV 78.8 78.9  < > 76.6* 76.6* 76.2*  PLT 284 214  < > 249 302 325  < > = values in this interval not displayed. 05/09/13:  Wbc 14, h/h 7.6/24.5, plts 444, Na 137, K 5.7, BUN 20, cr 0.86, alb 2.4, phos 3.4, mag 1.5, tg 203, prealb 18.9  05/09/13:  CXR basilar findings are new since 04/20/13, right lower lobe atelectasis, focal subsegmental opacity at right base (was  sent to hospital that day)  05/13/13:  Na 137, K 4.8, BUN 25, cr 0.95, Ca 9  Assessment/Plan 1. Aspiration pneumonia  NS at 70cc/hr x 1 liter today, start abx again Augmentin 850/125mg  po q12 x 7 days, cbc with diff and  bmp in am   2. Protein-calorie malnutrition, severe -due to dysphagia and deconditioning -receiving tube feeding since last hospitalization -had been on tpn which was stopped, peg placed for tube feeding per family request  3. Sacral decubitus ulcer, stage IV -receiving wound care [facility] treatment nurse and [facility]  4. Physical deconditioning -severe, bedbound, cachectic male continues to decline -I have consulted palliative care (previously order written) -now appears to have yet another pneumonia due to recurrent aspiration and immunocompromised state--I recommend hospice care for him, but I have been unable to meet with his family thus far  Family/ staff Communication: seen with unit supervisor  Goals of care: here for short term rehab, but prognosis is poor for recovery to return home  Labs/tests ordered:  Pending CXR, cbc with diff, bmp

## 2013-06-01 ENCOUNTER — Ambulatory Visit (HOSPITAL_COMMUNITY)
Admission: RE | Admit: 2013-06-01 | Discharge: 2013-06-01 | Disposition: A | Discharge status: Home / Self Care | Payer: Medicare Other | Source: Ambulatory Visit | Attending: Internal Medicine | Admitting: Internal Medicine

## 2013-06-01 DIAGNOSIS — R633 Feeding difficulties, unspecified: Secondary | ICD-10-CM

## 2013-06-01 DIAGNOSIS — Y833 Surgical operation with formation of external stoma as the cause of abnormal reaction of the patient, or of later complication, without mention of misadventure at the time of the procedure: Secondary | ICD-10-CM | POA: Insufficient documentation

## 2013-06-01 DIAGNOSIS — K9420 Gastrostomy complication, unspecified: Secondary | ICD-10-CM | POA: Insufficient documentation

## 2013-06-01 MED ORDER — IOHEXOL 300 MG/ML  SOLN
10.0000 mL | Freq: Once | INTRAMUSCULAR | Status: AC | PRN
  Administered 2013-06-01: 10 mL

## 2013-06-01 NOTE — Procedures (Signed)
GJ exchange

## 2013-06-28 ENCOUNTER — Non-Acute Institutional Stay (SKILLED_NURSING_FACILITY): Payer: Medicare Other | Admitting: Internal Medicine

## 2013-06-28 DIAGNOSIS — I4891 Unspecified atrial fibrillation: Secondary | ICD-10-CM

## 2013-06-28 DIAGNOSIS — R5381 Other malaise: Secondary | ICD-10-CM

## 2013-06-28 DIAGNOSIS — L8994 Pressure ulcer of unspecified site, stage 4: Secondary | ICD-10-CM

## 2013-06-28 DIAGNOSIS — L89154 Pressure ulcer of sacral region, stage 4: Secondary | ICD-10-CM

## 2013-06-28 DIAGNOSIS — F32A Depression, unspecified: Secondary | ICD-10-CM

## 2013-06-28 DIAGNOSIS — E038 Other specified hypothyroidism: Secondary | ICD-10-CM

## 2013-06-28 DIAGNOSIS — E43 Unspecified severe protein-calorie malnutrition: Secondary | ICD-10-CM

## 2013-06-28 DIAGNOSIS — F329 Major depressive disorder, single episode, unspecified: Secondary | ICD-10-CM

## 2013-06-28 DIAGNOSIS — L89109 Pressure ulcer of unspecified part of back, unspecified stage: Secondary | ICD-10-CM

## 2013-06-28 DIAGNOSIS — F3289 Other specified depressive episodes: Secondary | ICD-10-CM

## 2013-06-28 NOTE — Progress Notes (Signed)
Patient ID: Craig [name], male   DOB: Dec 13, 1945, 68 y.o.   MRN: [id number]  [facility]  Provider:  Gwenith Spitziffany L. Renato Gailseed, D.O., C.M.D.  Code Status:  Full code  Chief Complaint  Patient presents with  . Medical Management of Chronic Issues    Routine Visit     HPI:  Weight up to 159 from 143.   Was to have palliative care consult--unclear if this got sent through or done.    Review of Systems:  Review of Systems  Constitutional: Negative for fever.  HENT: Negative for congestion.   Respiratory: Negative for shortness of breath.   Cardiovascular: Negative for chest pain and leg swelling.  Gastrointestinal: Negative for abdominal pain, constipation, blood in stool and melena.  Genitourinary: Negative for dysuria.  Musculoskeletal:       Pain in heels  Skin: Negative for rash.  Neurological: Negative for dizziness.  Endo/Heme/Allergies: Does not bruise/bleed easily.  Psychiatric/Behavioral: Positive for depression. Negative for memory loss.    Medications: Patient's Medications  New Prescriptions   No medications on file  Previous Medications   AMIODARONE (PACERONE) 200 MG TABLET    Take 200 mg by mouth daily.   ASPIRIN 81 MG TABLET    Take 81 mg by mouth daily.   CARBOMER GEL BASE (HYDROGEL) GEL    1 application by Does not apply route every other day. To left ankle   CARBOMER GEL BASE (HYDROGEL) GEL    Apply 1 application topically every other day. To right ankle   COLLAGENASE (SANTYL) OINTMENT    Apply 1 application topically 3 (three) times daily. To right plantar surface   ESCITALOPRAM (LEXAPRO) 10 MG TABLET    Take 10 mg by mouth daily.   FLUTICASONE FUROATE-VILANTEROL (BREO ELLIPTA) 100-25 MCG/INH AEPB    Inhale 1 puff into the lungs daily.    FOLIC ACID (FOLVITE) 1 MG TABLET    Take 1 mg by mouth daily.   K PHOS MONO-SOD PHOS DI & MONO (PHOSPHA 250 NEUTRAL PO)    Take 1 capsule by mouth 2 (two) times daily.   LACTULOSE (CHRONULAC) 10  GM/15ML SOLUTION    Take 20 g by mouth daily.   LANSOPRAZOLE (PREVACID SOLUTAB) 30 MG DISINTEGRATING TABLET    Take 30 mg by mouth daily.   LEVOTHYROXINE (SYNTHROID, LEVOTHROID) 150 MCG TABLET    Take 150 mcg by mouth daily before breakfast.   MELATONIN 3 MG TABS    Take 1 tablet by mouth daily.   METOCLOPRAMIDE (REGLAN) 5 MG/5ML SOLUTION    Take 5 mg by mouth every 6 (six) hours as needed for nausea.   MORPHINE (ROXANOL) 20 MG/ML CONCENTRATED SOLUTION    Give 0.275ml per G Tube once daily before dressing changes; Give 0.1525ml per G Tube every 6 hours as needed for pain   MULTIPLE VITAMINS-MINERALS (DECUBI-VITE PO)    Take 1 capsule by mouth daily.   POTASSIUM CHLORIDE (KLOR-CON) 20 MEQ PACKET    20 mEq by PEG Tube route 2 (two) times daily.   THIAMINE 100 MG TABLET    Take 100 mg by mouth daily.  Modified Medications   No medications on file  Discontinued Medications   CVS CALCIUM ALGINATE 4"X4" EX    Apply 1 application topically. To left heel    Physical Exam: Filed Vitals:   06/28/13 1347  BP: 110/67  Pulse: 78  Temp: 97.4 F (36.3 C)  TempSrc: Oral  Resp: 15  Weight: 159  lb (72.122 kg)  SpO2: 98%  Physical Exam  Constitutional: He is oriented to person, place, and time. No distress.  Frail black male resting in bed  HENT:  Head: Normocephalic and atraumatic.  Eyes: EOM are normal. Pupils are equal, round, and reactive to light.  Cardiovascular: Regular rhythm, normal heart sounds and intact distal pulses.   Pulmonary/Chest: Effort normal and breath sounds normal.  Abdominal: Soft. Bowel sounds are normal.  Neurological: He is alert and oriented to person, place, and time.  Skin:  Bilateral heels with pressure ulcers present, in prevalon boots  Psychiatric:  Flat affect     Labs reviewed: Basic Metabolic Panel:  Recent Labs  69/62/9504/16/15 0430 04/29/13 0740 04/30/13 0437 05/01/13 0715 05/02/13 0450  NA 136* 136* 137 136* 138  K 3.9 4.2 4.2 4.4 3.7  CL 97 100 101 98  100  CO2 27 25 25 24 23   GLUCOSE 113* 102* 70 56* 79  BUN 28* 31* 25* 23 20  CREATININE 1.15 1.12 1.11 1.15 1.12  CALCIUM 9.1 9.2 9.2 9.5 9.5  MG 1.6 1.9  --   --  1.7  PHOS 4.5  --   --  3.6 3.8    Liver Function Tests:  Recent Labs  04/27/13 0500 04/28/13 0430 05/01/13 0715  AST 217* 160* 86*  ALT 207* 186* 134*  ALKPHOS 79 76 89  BILITOT 0.3 <0.2* 0.3  PROT 7.3 6.6 8.0  ALBUMIN 1.9* 1.7* 1.9*    CBC:  Recent Labs  04/26/13 1135 04/27/13 0500  04/30/13 0437 05/01/13 0715 05/02/13 0450  WBC 16.9* 14.5*  < > 15.8* 15.7* 16.0*  NEUTROABS 12.0* 9.2*  --   --   --   --   HGB 9.8* 7.9*  < > 7.5* 8.1* 7.5*  HCT 30.5* 25.1*  < > 23.2* 25.2* 23.7*  MCV 78.8 78.9  < > 76.6* 76.6* 76.2*  PLT 284 214  < > 249 302 325  < > = values in this interval not displayed.  Assessment/Plan 1. Protein-calorie malnutrition, severe -had been hospitalized with sepsis and has not recovered well -is finally gaining some weight with his tube feedings, eager to eat on his own, but very high aspiration risk -ST working with him  2. Sacral decubitus ulcer, stage IV -gives him pain as do the heel ulcers -cont roxanol prn -cont wound care per treatment nurse -need results of palliative care consult if order ever got sent by social work  3. Physical deconditioning -severe--still does not leave bed, prognosis poor, but he has determination to get strong and go home which would be quite miraculous  4. Depression -cont lexapro low dose  5. Atrial fibrillation, unspecified -cont amiodarone low dose--is sinus now  6. Other specified hypothyroidism Cont synthroid, monitor tsh  Family/ staff Communication: seen with unit supervisor  Goals of care: remain aggressive due to his plan to return home   Labs/tests ordered:  tsh

## 2013-09-03 ENCOUNTER — Emergency Department (HOSPITAL_COMMUNITY)
Admission: EM | Admit: 2013-09-03 | Discharge: 2013-09-03 | Disposition: A | Discharge status: Home / Self Care | Payer: Medicare Other | Attending: Emergency Medicine | Admitting: Emergency Medicine

## 2013-09-03 DIAGNOSIS — Z79899 Other long term (current) drug therapy: Secondary | ICD-10-CM | POA: Diagnosis not present

## 2013-09-03 DIAGNOSIS — Z7982 Long term (current) use of aspirin: Secondary | ICD-10-CM | POA: Diagnosis not present

## 2013-09-03 DIAGNOSIS — I252 Old myocardial infarction: Secondary | ICD-10-CM | POA: Diagnosis not present

## 2013-09-03 DIAGNOSIS — J449 Chronic obstructive pulmonary disease, unspecified: Secondary | ICD-10-CM | POA: Diagnosis not present

## 2013-09-03 DIAGNOSIS — I10 Essential (primary) hypertension: Secondary | ICD-10-CM | POA: Diagnosis not present

## 2013-09-03 DIAGNOSIS — K9423 Gastrostomy malfunction: Secondary | ICD-10-CM | POA: Insufficient documentation

## 2013-09-03 DIAGNOSIS — K219 Gastro-esophageal reflux disease without esophagitis: Secondary | ICD-10-CM | POA: Insufficient documentation

## 2013-09-03 DIAGNOSIS — Z87891 Personal history of nicotine dependence: Secondary | ICD-10-CM | POA: Insufficient documentation

## 2013-09-03 DIAGNOSIS — IMO0002 Reserved for concepts with insufficient information to code with codable children: Secondary | ICD-10-CM | POA: Insufficient documentation

## 2013-09-03 DIAGNOSIS — J4489 Other specified chronic obstructive pulmonary disease: Secondary | ICD-10-CM | POA: Insufficient documentation

## 2013-09-03 DIAGNOSIS — Y833 Surgical operation with formation of external stoma as the cause of abnormal reaction of the patient, or of later complication, without mention of misadventure at the time of the procedure: Secondary | ICD-10-CM | POA: Insufficient documentation

## 2013-09-03 MED ORDER — OXYCODONE HCL 5 MG PO TABS
5.0000 mg | ORAL_TABLET | Freq: Once | ORAL | Status: AC
  Administered 2013-09-03: 5 mg via ORAL
  Filled 2013-09-03: qty 1

## 2013-09-03 NOTE — Discharge Instructions (Signed)
Care of a Feeding Tube °People who have trouble swallowing or cannot take food or medicine by mouth are sometimes given feeding tubes. A feeding tube can go into the nose and down to the stomach or through the skin in the abdomen and into the stomach or small bowel. Some of the names of these feeding tubes are gastrostomy tubes, PEG lines, nasogastric tubes, and gastrojejunostomy tubes.  °SUPPLIES NEEDED TO CARE FOR THE TUBE SITE °· Clean gloves. °· Clean wash cloth, gauze pads, or soft paper towel. °· Cotton swabs. °· Skin barrier ointment or cream. °· Soap and water. °· Pre-cut foam pads or gauze (that go around the tube). °· Tube tape. °TUBE SITE CARE °1. Have all supplies ready and available. °2. Wash hands well. °3. Put on clean gloves. °4. Remove the soiled foam pad or gauze, if present, that is found under the tube stabilizer. Change the foam pad or gauze daily or when soiled or moist. °5. Check the skin around the tube site for redness, rash, swelling, drainage, or extra tissue growth. If you notice any of these, call your caregiver. °6. Moisten gauze and cotton swabs with water and soap. °7. Wipe the area closest to the tube (right near the stoma) with cotton swabs. Wipe the surrounding skin with moistened gauze. Rinse with water. °8. Dry the skin and stoma site with a dry gauze pad or soft paper towel. Do not use antibiotic ointments at the tube site. °9. If the skin is red, apply a skin barrier cream or ointment (such as petroleum jelly) in a circular motion, using a cotton swab. The cream or ointment will provide a moisture barrier for the skin and helps with wound healing. °10. Apply a new pre-cut foam pad or gauze around the tube. Secure it with tape around the edges. If no drainage is present, foam pads or gauze may be left off. °11. Use tape or an anchoring device to fasten the feeding tube to the skin for comfort or as directed. Rotate where you tape the tube to avoid skin damage from the  adhesive. °12. Position the person in a semi-upright position (30-45 degree angle). °13. Throw away used supplies. °14. Remove gloves. °15. Wash hands. °SUPPLIES NEEDED TO FLUSH A FEEDING TUBE °· Clean gloves. °· 60 mL syringe (that connects to the feeding tube). °· Towel. °· Water. °FLUSHING A FEEDING TUBE  °1. Have all supplies ready and available. °2. Wash hands well. °3. Put on clean gloves. °4. Draw up 30 mL of water in the syringe. °5. Kink the feeding tube while disconnecting it from the feeding-bag tubing or while removing the plug at the end of the tube. Kinking closes the tube and prevents secretions in the tube from spilling out. °6. Insert the tip of the syringe into the end of the feeding tube. Release the kink. Slowly inject the water. °7. If unable to inject the water, the person with the feeding tube should lay on his or her left side. The tip of the tube may be against the stomach wall, blocking fluid flow. Changing positions may move the tip away from the stomach wall. After repositioning, try injecting the water again. °8. After injecting the water, remove the syringe. °9. Always flush before giving the first medicine, between medicines, and after the final medicine before starting a feeding. This prevents medicines from clogging the tube. °10. Throw away used supplies. °11. Remove gloves. °12. Wash hands. °Document Released: 12/30/2004 Document Revised: 12/17/2011 Document Reviewed: 08/14/2011 °  Exit[employer]® Patient Information ©2015 [employer]. This information is not intended to replace advice given to you by your health care provider. Make sure you discuss any questions you have with your health care provider. ° °

## 2013-09-03 NOTE — ED Provider Notes (Signed)
CSN: [id number]     Arrival date & time 09/03/13  0825 History   First MD Initiated Contact with Patient 09/03/13 [phone]     Chief Complaint  Patient presents with  . PEG tube clogged      (Consider location/radiation/quality/duration/timing/severity/associated sxs/prior Treatment) HPI Comments: Pt reports his PEG tube has been clogged and the nursing staff at his facility haven't been able to use it since 2 days ago. He states he can take PO, but gets additional feeds through tube at night. He states he has otherwise been feeling "pretty good". He states "unclog this thing so I can go back home".   The history is provided by the patient. No language interpreter was used.    Past Medical History  Diagnosis Date  . COPD (chronic obstructive pulmonary disease)   . Hypertension   . Myocardial infarction   . A-fib   . GERD (gastroesophageal reflux disease)    No past surgical history on file. No family history on file. History  Substance Use Topics  . Smoking status: Former Games developer  . Smokeless tobacco: Not on file  . Alcohol Use: No    Review of Systems  Constitutional: Negative for fever, activity change, appetite change and fatigue.  HENT: Negative for congestion, facial swelling, rhinorrhea and trouble swallowing.   Eyes: Negative for photophobia and pain.  Respiratory: Negative for cough, chest tightness and shortness of breath.   Cardiovascular: Negative for chest pain and leg swelling.  Gastrointestinal: Negative for nausea, vomiting, abdominal pain, diarrhea and constipation.  Endocrine: Negative for polydipsia and polyuria.  Genitourinary: Negative for dysuria, urgency, decreased urine volume and difficulty urinating.  Musculoskeletal: Negative for back pain and gait problem.  Skin: Negative for color change, rash and wound.  Allergic/Immunologic: Negative for immunocompromised state.  Neurological: Negative for dizziness, facial asymmetry, speech difficulty, weakness,  numbness and headaches.  Psychiatric/Behavioral: Negative for confusion, decreased concentration and agitation.      Allergies  Review of patient's allergies indicates no known allergies.  Home Medications   Prior to Admission medications   Medication Sig Start Date End Date Taking? Authorizing Provider  amiodarone (PACERONE) 200 MG tablet Take 200 mg by mouth daily.    Historical Provider, MD  aspirin 81 MG tablet Take 81 mg by mouth daily.    Historical Provider, MD  Carbomer Gel Base (HYDROGEL) GEL 1 application by Does not apply route every other day. To left ankle    Historical Provider, MD  Carbomer Gel Base (HYDROGEL) GEL Apply 1 application topically every other day. To right ankle    Historical Provider, MD  collagenase (SANTYL) ointment Apply 1 application topically 3 (three) times daily. To right plantar surface    Historical Provider, MD  escitalopram (LEXAPRO) 10 MG tablet Take 10 mg by mouth daily.    Historical Provider, MD  Fluticasone Furoate-Vilanterol (BREO ELLIPTA) 100-25 MCG/INH AEPB Inhale 1 puff into the lungs daily.     Historical Provider, MD  folic acid (FOLVITE) 1 MG tablet Take 1 mg by mouth daily.    Historical Provider, MD  K Phos Mono-Sod Phos Di & Mono (PHOSPHA 250 NEUTRAL PO) Take 1 capsule by mouth 2 (two) times daily.    Historical Provider, MD  lactulose (CHRONULAC) 10 GM/15ML solution Take 20 g by mouth daily.    Historical Provider, MD  lansoprazole (PREVACID SOLUTAB) 30 MG disintegrating tablet Take 30 mg by mouth daily.    Historical Provider, MD  levothyroxine (SYNTHROID, LEVOTHROID) 150 MCG tablet  Take 150 mcg by mouth daily before breakfast.    Historical Provider, MD  Melatonin 3 MG TABS Take 1 tablet by mouth daily.    Historical Provider, MD  metoCLOPramide (REGLAN) 5 MG/5ML solution Take 5 mg by mouth every 6 (six) hours as needed for nausea.    Historical Provider, MD  morphine (ROXANOL) 20 MG/ML concentrated solution Give 0.85ml per G Tube  once daily before dressing changes; Give 0.57ml per G Tube every 6 hours as needed for pain 05/11/13   Kimber Relic, MD  Multiple Vitamins-Minerals (DECUBI-VITE PO) Take 1 capsule by mouth daily.    Historical Provider, MD  potassium chloride (KLOR-CON) 20 MEQ packet 20 mEq by PEG Tube route 2 (two) times daily.    Historical Provider, MD  thiamine 100 MG tablet Take 100 mg by mouth daily.    Historical Provider, MD   BP 109/54  Pulse 88  Temp(Src) 97.7 F (36.5 C) (Oral)  Resp 16  SpO2 94% Physical Exam  Constitutional: He is oriented to person, place, and time. He appears well-developed and well-nourished. No distress.  HENT:  Head: Normocephalic and atraumatic.  Mouth/Throat: No oropharyngeal exudate.  Eyes: Pupils are equal, round, and reactive to light.  Neck: Normal range of motion. Neck supple.  Cardiovascular: Normal rate, regular rhythm and normal heart sounds.  Exam reveals no gallop and no friction rub.   No murmur heard. Pulmonary/Chest: Effort normal and breath sounds normal. No respiratory distress. He has no wheezes. He has no rales.  Abdominal: Soft. Bowel sounds are normal. He exhibits no distension and no mass. There is no tenderness. There is no rebound and no guarding.    Musculoskeletal: Normal range of motion. He exhibits no edema and no tenderness.  Neurological: He is alert and oriented to person, place, and time.  Skin: Skin is warm and dry.  Psychiatric: He has a normal mood and affect.    ED Course  Procedures (including critical care time) Labs Review Labs Reviewed - No data to display  Imaging Review No results found.   EKG Interpretation None      MDM   Final diagnoses:  Gastrostomy tube dysfunction    Pt is a 68 y.o. male with Pmhx as above including severe protein calorie malnutrition who presents with clogged JG tube. He states the facility have been able to use it since two days ago. He reports he has able to take PO. JG is used for  additional feeds at night. He otherwise has no complaints and states he has been feeling "pretty good".   Nursing unable to get gastric port to flush, but jejunal port flushing. This does not need to be emergently replaced. Ordered placed for JG change by IR for Monday. This is how tube was replaced May '15 as well. Return precautions given for new or worsening symptoms including ab pain, fever, inability to tolerate PO.          Toy Cookey, MD 09/03/13 [phone]

## 2013-09-03 NOTE — ED Notes (Signed)
J-tube decloged and flushed,  Still unable to declog G-tube. Dr Micheline Mazeocherty notified. Spoke to pt's nurse  Molly Maduroobert, from [facility] who sts pt is receiving continuous feedings through the G-tube every night and if he can't declog it   We should go ahead and have it replaced.

## 2013-09-03 NOTE — ED Notes (Signed)
Bed: WA10 Expected date: 09/03/13 Expected time: 7:58 AM Means of arrival: Ambulance Comments: Diff flushing PEG tube

## 2013-09-03 NOTE — ED Notes (Addendum)
Pt from [facility] on [address], come in today with clogged PEG tube and sts no one has been flushing tube post feeding per EMS.  Alert and oriented x 4.  Bed sores on heels and bottom per EMS.  VITALS PER EMS 122/58, HR 84, 98% on 2L and normally on 2L,

## 2013-09-05 ENCOUNTER — Other Ambulatory Visit: Payer: Self-pay | Admitting: *Deleted

## 2013-09-05 ENCOUNTER — Other Ambulatory Visit (HOSPITAL_COMMUNITY): Payer: Self-pay | Admitting: Emergency Medicine

## 2013-09-05 ENCOUNTER — Other Ambulatory Visit (HOSPITAL_COMMUNITY): Payer: Medicare Other

## 2013-09-05 ENCOUNTER — Ambulatory Visit (HOSPITAL_COMMUNITY)
Admission: RE | Admit: 2013-09-05 | Discharge: 2013-09-05 | Disposition: A | Discharge status: Home / Self Care | Payer: Medicare Other | Source: Ambulatory Visit | Attending: Emergency Medicine | Admitting: Emergency Medicine

## 2013-09-05 DIAGNOSIS — Z434 Encounter for attention to other artificial openings of digestive tract: Secondary | ICD-10-CM | POA: Insufficient documentation

## 2013-09-05 DIAGNOSIS — K9429 Other complications of gastrostomy: Principal | ICD-10-CM

## 2013-09-05 DIAGNOSIS — K9423 Gastrostomy malfunction: Secondary | ICD-10-CM

## 2013-09-05 MED ORDER — AMBULATORY NON FORMULARY MEDICATION: Status: DC

## 2013-09-05 MED ORDER — IOHEXOL 300 MG/ML  SOLN
50.0000 mL | Freq: Once | INTRAMUSCULAR | Status: AC | PRN
  Administered 2013-09-05: 20 mL

## 2013-09-05 NOTE — Procedures (Signed)
Technically successful bedside un-clogging of GJ tube.  No exchange performed. Both lumens are ready for immediate use.

## 2013-09-05 NOTE — Telephone Encounter (Signed)
[facility] 

## 2013-09-06 ENCOUNTER — Emergency Department (HOSPITAL_COMMUNITY)
Admission: EM | Admit: 2013-09-06 | Discharge: 2013-09-06 | Disposition: A | Discharge status: Home / Self Care | Payer: Medicare Other | Attending: Emergency Medicine | Admitting: Emergency Medicine

## 2013-09-06 ENCOUNTER — Emergency Department (HOSPITAL_COMMUNITY): Payer: Medicare Other

## 2013-09-06 ENCOUNTER — Encounter (HOSPITAL_COMMUNITY): Payer: Self-pay | Admitting: Emergency Medicine

## 2013-09-06 DIAGNOSIS — R109 Unspecified abdominal pain: Secondary | ICD-10-CM | POA: Diagnosis present

## 2013-09-06 DIAGNOSIS — K9423 Gastrostomy malfunction: Secondary | ICD-10-CM | POA: Insufficient documentation

## 2013-09-06 DIAGNOSIS — T85528A Displacement of other gastrointestinal prosthetic devices, implants and grafts, initial encounter: Secondary | ICD-10-CM

## 2013-09-06 DIAGNOSIS — J449 Chronic obstructive pulmonary disease, unspecified: Secondary | ICD-10-CM | POA: Diagnosis not present

## 2013-09-06 DIAGNOSIS — I4891 Unspecified atrial fibrillation: Secondary | ICD-10-CM | POA: Insufficient documentation

## 2013-09-06 DIAGNOSIS — I252 Old myocardial infarction: Secondary | ICD-10-CM | POA: Insufficient documentation

## 2013-09-06 DIAGNOSIS — Z7982 Long term (current) use of aspirin: Secondary | ICD-10-CM | POA: Diagnosis not present

## 2013-09-06 DIAGNOSIS — Z09 Encounter for follow-up examination after completed treatment for conditions other than malignant neoplasm: Secondary | ICD-10-CM | POA: Diagnosis present

## 2013-09-06 DIAGNOSIS — K219 Gastro-esophageal reflux disease without esophagitis: Secondary | ICD-10-CM | POA: Diagnosis not present

## 2013-09-06 DIAGNOSIS — Z87891 Personal history of nicotine dependence: Secondary | ICD-10-CM | POA: Insufficient documentation

## 2013-09-06 DIAGNOSIS — I10 Essential (primary) hypertension: Secondary | ICD-10-CM | POA: Insufficient documentation

## 2013-09-06 DIAGNOSIS — Z79899 Other long term (current) drug therapy: Secondary | ICD-10-CM | POA: Diagnosis not present

## 2013-09-06 DIAGNOSIS — IMO0002 Reserved for concepts with insufficient information to code with codable children: Secondary | ICD-10-CM | POA: Insufficient documentation

## 2013-09-06 DIAGNOSIS — J4489 Other specified chronic obstructive pulmonary disease: Secondary | ICD-10-CM | POA: Insufficient documentation

## 2013-09-06 DIAGNOSIS — Z434 Encounter for attention to other artificial openings of digestive tract: Secondary | ICD-10-CM

## 2013-09-06 MED ORDER — IOHEXOL 300 MG/ML  SOLN
50.0000 mL | Freq: Once | INTRAMUSCULAR | Status: AC | PRN
  Administered 2013-09-06: 1 mL

## 2013-09-06 NOTE — ED Notes (Signed)
[facility] contacted for pt transfer

## 2013-09-06 NOTE — Procedures (Signed)
Successful fluoroscopic guided replacement of a new 18 Fr gastrojejunostomy tube.  No immediate post procedural complications.  The feeding tube is ready for immediate use.

## 2013-09-06 NOTE — ED Notes (Signed)
Awaiting update re: availability of gastric jejunal cath 18 Fr or larger

## 2013-09-06 NOTE — Discharge Instructions (Signed)
RETURN IF YOU DEVELOP PAIN, FEVER, VOMITING OR OTHER CONCERNING SYMPTOMS. YOU CAN RESUME USE OF YOUR FEEDING TUBE.

## 2013-09-06 NOTE — ED Provider Notes (Signed)
CSN: [id number]     Arrival date & time 09/06/13  4098 History   First MD Initiated Contact with Patient 09/06/13 [phone]     Chief Complaint  Patient presents with  . Abdominal Pain     (Consider location/radiation/quality/duration/timing/severity/associated sxs/prior Treatment) HPI Comments: Patient presents from outside nursing home with report of G-tube pulled out. The patient reports that about 3:30 this morning he noted a liquid dripping on his abdomen and observed that the tube was out. The patient denies any associated pain or other problems at this time. He was seen in the emergency department 2 days ago with the tube being clogged, it apparently was successfully flushed and was functional up until last night. The patient does have multiple chronic medical problems however this time he has no other associated complaints.  Patient is a 68 y.o. male presenting with abdominal pain.  Abdominal Pain   Past Medical History  Diagnosis Date  . COPD (chronic obstructive pulmonary disease)   . Hypertension   . Myocardial infarction   . A-fib   . GERD (gastroesophageal reflux disease)    History reviewed. No pertinent past surgical history. No family history on file. History  Substance Use Topics  . Smoking status: Former Games developer  . Smokeless tobacco: Not on file  . Alcohol Use: No    Review of Systems  Gastrointestinal: Positive for abdominal pain.  All other systems reviewed and are negative. Although patient has multiple medical problems and chronic severe disability he has no acute complaints on the review of systems.    Allergies  Review of patient's allergies indicates no known allergies.  Home Medications   Prior to Admission medications   Medication Sig Start Date End Date Taking? Authorizing Provider  AMBULATORY NON FORMULARY MEDICATION Morphine Sul Sol 100/9ml Sig: Take 0.63ml per tube every 6 hours as needed for pain 09/05/13   Tiffany L Reed, DO  amiodarone  (PACERONE) 200 MG tablet Take 200 mg by mouth daily.    Historical Provider, MD  aspirin 81 MG tablet Take 81 mg by mouth daily.    Historical Provider, MD  Carbomer Gel Base (HYDROGEL) GEL 1 application by Does not apply route every other day. To left ankle    Historical Provider, MD  Carbomer Gel Base (HYDROGEL) GEL Apply 1 application topically every other day. To right ankle    Historical Provider, MD  collagenase (SANTYL) ointment Apply 1 application topically 3 (three) times daily. To right plantar surface    Historical Provider, MD  escitalopram (LEXAPRO) 10 MG tablet Take 10 mg by mouth daily.    Historical Provider, MD  Fluticasone Furoate-Vilanterol (BREO ELLIPTA) 100-25 MCG/INH AEPB Inhale 1 puff into the lungs daily.     Historical Provider, MD  folic acid (FOLVITE) 1 MG tablet Take 1 mg by mouth daily.    Historical Provider, MD  K Phos Mono-Sod Phos Di & Mono (PHOSPHA 250 NEUTRAL PO) Take 1 capsule by mouth 2 (two) times daily.    Historical Provider, MD  lactulose (CHRONULAC) 10 GM/15ML solution Take 20 g by mouth daily.    Historical Provider, MD  lansoprazole (PREVACID SOLUTAB) 30 MG disintegrating tablet Take 30 mg by mouth daily.    Historical Provider, MD  levothyroxine (SYNTHROID, LEVOTHROID) 150 MCG tablet Take 150 mcg by mouth daily before breakfast.    Historical Provider, MD  Melatonin 3 MG TABS Take 1 tablet by mouth daily.    Historical Provider, MD  metoCLOPramide (REGLAN) 5 MG/5ML solution  Take 5 mg by mouth every 6 (six) hours as needed for nausea.    Historical Provider, MD  morphine (ROXANOL) 20 MG/ML concentrated solution Give 0.38ml per G Tube once daily before dressing changes; Give 0.58ml per G Tube every 6 hours as needed for pain 05/11/13   Kimber Relic, MD  Multiple Vitamins-Minerals (DECUBI-VITE PO) Take 1 capsule by mouth daily.    Historical Provider, MD  potassium chloride (KLOR-CON) 20 MEQ packet 20 mEq by PEG Tube route 2 (two) times daily.    Historical  Provider, MD  thiamine 100 MG tablet Take 100 mg by mouth daily.    Historical Provider, MD   BP 139/64  Pulse 86  Temp(Src) 98.1 F (36.7 C) (Oral)  Ht 6' 2.25" (1.886 m)  Wt 160 lb (72.576 kg)  BMI 20.40 kg/m2  SpO2 99% Physical Exam This is a debilitated elderly gentleman but he is alert appropriate and in no acute distress. Head normocephalic atraumatic Extraocular motions intact Mucous membrane is moist No acute respiratory distress. The patient has soft breath sounds occasional wheeze and mild Rales at the bases consistent with history of a COPD. Heart is regular Abdomen has positive bowel sounds. A jejunostomy tube is lying against out with the balloon inflated but outside of the fistula. Patient's colostomy with soft brown stool in it. His lower extremities are atrophic with bandages on the feet and una boots for what appears to be an extensive skin breakdown. No significant edema of the lower extremities. ED Course  Procedures (including critical care time) Labs Review Labs Reviewed - No data to display  Imaging Review   EKG Interpretation None      MDM  Patient has been to interventional radiology and successfully had his jejunostomy/gastrostomy tube replaced. he has no complaints he'll be discharged in stable condition. Final diagnoses:  Gastrojejunostomy tube dislodgement      [name], MD 09/06/13 1428

## 2013-09-06 NOTE — ED Notes (Signed)
Pt in from [facility] via [place], per report pt reports having G tube pulled out yesterday, upon arrival to ED the G tube ballon can be visualized & is outside of the abd, no peri redness around siteness, denies pain to the area, area covered with guaze & paper tape, pt A&O x4, follows commands, speaks in complete sentences

## 2013-09-20 ENCOUNTER — Encounter: Payer: Self-pay | Admitting: Internal Medicine

## 2013-09-20 ENCOUNTER — Non-Acute Institutional Stay (SKILLED_NURSING_FACILITY): Payer: Medicare Other | Admitting: Internal Medicine

## 2013-09-20 DIAGNOSIS — L8995 Pressure ulcer of unspecified site, unstageable: Secondary | ICD-10-CM

## 2013-09-20 DIAGNOSIS — L89620 Pressure ulcer of left heel, unstageable: Secondary | ICD-10-CM

## 2013-09-20 DIAGNOSIS — F329 Major depressive disorder, single episode, unspecified: Secondary | ICD-10-CM

## 2013-09-20 DIAGNOSIS — F3289 Other specified depressive episodes: Secondary | ICD-10-CM

## 2013-09-20 DIAGNOSIS — R5381 Other malaise: Secondary | ICD-10-CM

## 2013-09-20 DIAGNOSIS — F32A Depression, unspecified: Secondary | ICD-10-CM

## 2013-09-20 DIAGNOSIS — L89610 Pressure ulcer of right heel, unstageable: Secondary | ICD-10-CM

## 2013-09-20 DIAGNOSIS — I4891 Unspecified atrial fibrillation: Secondary | ICD-10-CM

## 2013-09-20 DIAGNOSIS — L89609 Pressure ulcer of unspecified heel, unspecified stage: Secondary | ICD-10-CM

## 2013-09-20 DIAGNOSIS — E43 Unspecified severe protein-calorie malnutrition: Secondary | ICD-10-CM

## 2013-09-20 NOTE — Progress Notes (Signed)
Patient ID: Craig [name], male   DOB: 12-08-1945, 68 y.o.   MRN: [id number] [facility]  Location:  [facility] Provider:  Gwenith Spitz. Renato Gails, D.O., C.M.D.   Chief Complaint  Patient presents with  . Medical Management of Chronic Issues    HPI:  68 yo black male long term care resident seen for med mgt chronic diseases.  His intake has improved.  His bowels move well if he eats enough.  He denies pain. He is sleeping well.  He c/o itchy legs and dry skin.  He continues to use prevalon boots due to bilateral heel pressure ulcers.  He has a foley and oxygen.  He wants to have his PEG tube removed.  We discussed that he must be eating adequately in order to have this removed and show weight gain.  He understands.  Review of Systems:  Review of Systems  Constitutional: Positive for malaise/fatigue. Negative for fever.  HENT: Negative for congestion.   Eyes: Negative for blurred vision.  Respiratory: Negative for shortness of breath.   Cardiovascular: Negative for chest pain and leg swelling.  Gastrointestinal: Negative for abdominal pain, constipation, blood in stool and melena.  Genitourinary: Negative for dysuria.       Foley draining dark yellow urine  Musculoskeletal: Negative for myalgias, joint pain and falls.  Skin: Positive for itching. Negative for rash.       Dry skin  Neurological: Positive for weakness. Negative for dizziness.  Psychiatric/Behavioral: Positive for depression.    Medications: Patient's Medications  New Prescriptions   No medications on file  Previous Medications   AMIODARONE (PACERONE) 200 MG TABLET    Take 200 mg by mouth daily.   ASPIRIN EC 81 MG TABLET    Take 81 mg by mouth daily.   CARBOMER GEL BASE (HYDROGEL) GEL    Apply 1 application topically every other day. To right ankle   ENTERAL NUTRITION SUPPLIES MISC    185 mLs by Gastric Tube route every 4 (four) hours. Patency free water flushes   ENTERAL NUTRITION  SUPPLIES MISC    75 mLs by Gastric Tube route continuous. Osmalite at 75 ml from 8pm to 8am   ESCITALOPRAM (LEXAPRO) 10 MG TABLET    Take 10 mg by mouth daily.   FLUTICASONE FUROATE-VILANTEROL (BREO ELLIPTA) 100-25 MCG/INH AEPB    Inhale 1 puff into the lungs daily.    FOLIC ACID (FOLVITE) 1 MG TABLET    Take 1 mg by mouth daily.   K PHOS MONO-SOD PHOS DI & MONO (PHOSPHA 250 NEUTRAL PO)    Take 250 mg by mouth 2 (two) times daily.    LACTULOSE (CHRONULAC) 10 GM/15ML SOLUTION    Take 20 g by mouth daily.   LANSOPRAZOLE (PREVACID SOLUTAB) 30 MG DISINTEGRATING TABLET    Take 30 mg by mouth daily.   LEVOTHYROXINE (SYNTHROID, LEVOTHROID) 150 MCG TABLET    Take 150 mcg by mouth daily before breakfast.   MELATONIN 3 MG TABS    Take 1 tablet by mouth at bedtime.    METOCLOPRAMIDE (REGLAN) 5 MG TABLET    Take 5 mg by mouth every 6 (six) hours as needed for nausea.   MORPHINE 20 MG/5ML SOLUTION    Take 5 mg by mouth 2 (two) times daily. And every 6 hours as needed for pain   MULTIPLE VITAMINS-MINERALS (DECUBI-VITE PO)    Take 1 capsule by mouth daily.   POLYETHYLENE GLYCOL (MIRALAX / GLYCOLAX) PACKET  Take 17 g by mouth 3 (three) times daily.   POTASSIUM CHLORIDE (KLOR-CON) 20 MEQ PACKET    20 mEq by PEG Tube route 2 (two) times daily.   THIAMINE 100 MG TABLET    Take 100 mg by mouth daily.  Modified Medications   No medications on file  Discontinued Medications   No medications on file    Physical Exam: Filed Vitals:   09/20/13 1252  BP: 154/72  Pulse: 81  Temp: 96.3 F (35.7 C)  Resp: 16  Height:  (1.778 m)  Weight: 158 lb (71.668 kg)  SpO2: 98%  Physical Exam  Constitutional: He is oriented to person, place, and time.  Thin black male resting in bed on air mattress in nad  Cardiovascular: Normal rate, regular rhythm and normal heart sounds.   Pulmonary/Chest: Effort normal and breath sounds normal.  Abdominal: Soft. Bowel sounds are normal. He exhibits no distension and no mass.  There is no tenderness.  Genitourinary:  foley  Neurological: He is alert and oriented to person, place, and time.  Skin:  Bilateral heel ulcers, wearing prevalon boots  Psychiatric:  Flat affect, but will joke occasionally     Labs reviewed: Basic Metabolic Panel:  Recent Labs  96/04/54 0430 04/29/13 0740 04/30/13 0437 05/01/13 0715 05/02/13 0450  NA 136* 136* 137 136* 138  K 3.9 4.2 4.2 4.4 3.7  CL 97 100 101 98 100  CO2 GLUCOSE 113* 102* 70 56* 79  BUN 28* 31* 25* 23 20  CREATININE 1.15 1.12 1.11 1.15 1.12  CALCIUM 9.1 9.2 9.2 9.5 9.5  MG 1.6 1.9  --   --  1.7  PHOS 4.5  --   --  3.6 3.8    Liver Function Tests:  Recent Labs  04/27/13 0500 04/28/13 0430 05/01/13 0715  AST 217* 160* 86*  ALT 207* 186* 134*  ALKPHOS 79 76 89  BILITOT 0.3 <0.2* 0.3  PROT 7.3 6.6 8.0  ALBUMIN 1.9* 1.7* 1.9*    CBC:  Recent Labs  04/26/13 1135 04/27/13 0500  04/30/13 0437 05/01/13 0715 05/02/13 0450  WBC 16.9* 14.5*  < > 15.8* 15.7* 16.0*  NEUTROABS 12.0* 9.2*  --   --   --   --   HGB 9.8* 7.9*  < > 7.5* 8.1* 7.5*  HCT 30.5* 25.1*  < > 23.2* 25.2* 23.7*  MCV 78.8 78.9  < > 76.6* 76.6* 76.2*  PLT 284 214  < > 249 302 325  < > = values in this interval not displayed.  Assessment/Plan 1. Pressure ulcer, heel, left, unstageable -cont care per treatment nurse, has poor arterial circulation so chance of heeling is much diminished and he does not get up oob due to fatigue so the has continuous some pressure (using prevalon boots)  2. Pressure ulcer, heel, right, unstageable -same as #1  3. Protein-calorie malnutrition, severe -continue peg feeding and encouraged po intake  -if shows weight gain and improved intake, able to maintain adequate hydration orally, peg can be discontinued  4. Physical deconditioning -pt's goal remains to get stronger and be independent, remains quite weak and does not get up oob due to fatigue and sacral wound  5. Atrial  fibrillation, unspecified -on amiodarone--in sinus with this (has been effective)  6. Depression -cont lexapro for mood, has helped him   Family/ staff Communication: seen with DNS  Goals of care: to get stronger and go home--he knows this is far  reaching, but continues to have faith that he can do this

## 2013-10-04 ENCOUNTER — Encounter: Payer: Self-pay | Admitting: Internal Medicine

## 2013-10-04 ENCOUNTER — Non-Acute Institutional Stay (SKILLED_NURSING_FACILITY): Payer: Medicare Other | Admitting: Internal Medicine

## 2013-10-04 DIAGNOSIS — L89620 Pressure ulcer of left heel, unstageable: Secondary | ICD-10-CM

## 2013-10-04 DIAGNOSIS — L89610 Pressure ulcer of right heel, unstageable: Secondary | ICD-10-CM

## 2013-10-04 DIAGNOSIS — L8990 Pressure ulcer of unspecified site, unspecified stage: Secondary | ICD-10-CM

## 2013-10-04 DIAGNOSIS — L89609 Pressure ulcer of unspecified heel, unspecified stage: Secondary | ICD-10-CM

## 2013-10-04 DIAGNOSIS — L8995 Pressure ulcer of unspecified site, unstageable: Secondary | ICD-10-CM

## 2013-10-04 DIAGNOSIS — I4891 Unspecified atrial fibrillation: Secondary | ICD-10-CM

## 2013-10-04 DIAGNOSIS — R5381 Other malaise: Secondary | ICD-10-CM

## 2013-10-04 DIAGNOSIS — E43 Unspecified severe protein-calorie malnutrition: Secondary | ICD-10-CM

## 2013-10-04 DIAGNOSIS — L8994 Pressure ulcer of unspecified site, stage 4: Secondary | ICD-10-CM

## 2013-10-04 NOTE — Progress Notes (Signed)
Patient ID: Craig [name], male   DOB: [birth year], 68 y.o.   MRN: [id number]  Location:  [facility] Provider:  Gwenith Spitz. Renato Gails, D.O., C.M.D.  Code Status:  Full code  Chief Complaint  Patient presents with  . Acute Visit    PEG tube is clogged and he doesn't want to get it replaced, wants to eat instead and does not want to get fat    HPI:  68 yo black male here initially for rehab after hospitalization with respiratory failure from influenza A and HCAP, malnutrition, and decubitus ulcers at [facility].  He underwent diverting colostomy and GJ tube to help with healing of his pressure ulcer. He also had diverticultis that was treated.  He was on TPN at first, then switched to tube feedings.  He has made great strides.    He is now eating.  He continues to have Stage 4s on his sacrum, left heel, and right posterior calf and stage 3 on the left posterior calf.  He has PAD.  He is on propass and decubivite.  He has been eating well recently and he is up 31 lbs since his admission when he weighed 131 (now 162 lbs).  He says he doesn't want to get big again.  His weights have oscillated considerably over time.  We had an extensive discussion with him about eating enough to maintain since his tube is clogged and he does not want it replaced.  Review of Systems:  Review of Systems  Constitutional: Negative for weight loss and malaise/fatigue.       Gained weight  HENT: Negative for congestion and hearing loss.   Eyes: Negative for blurred vision.  Respiratory: Negative for shortness of breath.   Cardiovascular: Negative for chest pain.  Gastrointestinal: Negative for abdominal pain.       Clogged peg  Genitourinary: Negative for dysuria, urgency and frequency.  Musculoskeletal: Negative for falls.  Skin:       Pressure ulcers remain  Neurological: Positive for weakness. Negative for dizziness.  Psychiatric/Behavioral: Positive for depression. Negative for memory loss.     Medications: Patient's Medications  New Prescriptions   No medications on file  Previous Medications   AMIODARONE (PACERONE) 200 MG TABLET    Take 200 mg by mouth daily.   ASPIRIN EC 81 MG TABLET    Take 81 mg by mouth daily.   CARBOMER GEL BASE (HYDROGEL) GEL    Apply 1 application topically every other day. To right ankle   ENTERAL NUTRITION SUPPLIES MISC    185 mLs by Gastric Tube route every 4 (four) hours. Patency free water flushes   ENTERAL NUTRITION SUPPLIES MISC    75 mLs by Gastric Tube route continuous. Osmalite at 75 ml from 8pm to 8am   ESCITALOPRAM (LEXAPRO) 10 MG TABLET    Take 10 mg by mouth daily.   FLUTICASONE FUROATE-VILANTEROL (BREO ELLIPTA) 100-25 MCG/INH AEPB    Inhale 1 puff into the lungs daily.    FOLIC ACID (FOLVITE) 1 MG TABLET    Take 1 mg by mouth daily.   K PHOS MONO-SOD PHOS DI & MONO (PHOSPHA 250 NEUTRAL PO)    Take 250 mg by mouth 2 (two) times daily.    LACTULOSE (CHRONULAC) 10 GM/15ML SOLUTION    Take 20 g by mouth daily.   LANSOPRAZOLE (PREVACID SOLUTAB) 30 MG DISINTEGRATING TABLET    Take 30 mg by mouth daily.   LEVOTHYROXINE (SYNTHROID, LEVOTHROID) 150 MCG TABLET  Take 150 mcg by mouth daily before breakfast.   MELATONIN 3 MG TABS    Take 1 tablet by mouth at bedtime.    METOCLOPRAMIDE (REGLAN) 5 MG TABLET    Take 5 mg by mouth every 6 (six) hours as needed for nausea.   MORPHINE 20 MG/5ML SOLUTION    Take 5 mg by mouth 2 (two) times daily. And every 6 hours as needed for pain   MULTIPLE VITAMINS-MINERALS (DECUBI-VITE PO)    Take 1 capsule by mouth daily.   POLYETHYLENE GLYCOL (MIRALAX / GLYCOLAX) PACKET    Take 17 g by mouth 3 (three) times daily.   POTASSIUM CHLORIDE (KLOR-CON) 20 MEQ PACKET    20 mEq by PEG Tube route 2 (two) times daily.   THIAMINE 100 MG TABLET    Take 100 mg by mouth daily.  Modified Medications   No medications on file  Discontinued Medications   No medications on file    Physical Exam: Filed Vitals:   10/04/13 0912   BP: 122/61  Pulse: 81  Temp: 97.3 F (36.3 C)  Resp: 16  Height:  (1.778 m)  Weight: 158 lb (71.668 kg)  SpO2: 96%   Physical Exam  Constitutional: He is oriented to person, place, and time. No distress.  Cardiovascular: Normal rate, regular rhythm and normal heart sounds.   Pulmonary/Chest: Effort normal and breath sounds normal. No respiratory distress.  Abdominal: Soft. Bowel sounds are normal. He exhibits no distension and no mass. There is no tenderness.  Clogged peg in place  Neurological: He is alert and oriented to person, place, and time.  Skin: Skin is warm and dry.  Pressure ulcers of left heel and sacrum and bilateral posterior calves    Labs reviewed: Basic Metabolic Panel:  Recent Labs  52/84/13 0430 04/29/13 0740 04/30/13 0437 05/01/13 0715 05/02/13 0450  NA 136* 136* 137 136* 138  K 3.9 4.2 4.2 4.4 3.7  CL 97 100 101 98 100  CO2 GLUCOSE 113* 102* 70 56* 79  BUN 28* 31* 25* 23 20  CREATININE 1.15 1.12 1.11 1.15 1.12  CALCIUM 9.1 9.2 9.2 9.5 9.5  MG 1.6 1.9  --   --  1.7  PHOS 4.5  --   --  3.6 3.8    Liver Function Tests:  Recent Labs  04/27/13 0500 04/28/13 0430 05/01/13 0715  AST 217* 160* 86*  ALT 207* 186* 134*  ALKPHOS 79 76 89  BILITOT 0.3 <0.2* 0.3  PROT 7.3 6.6 8.0  ALBUMIN 1.9* 1.7* 1.9*    CBC:  Recent Labs  04/26/13 1135 04/27/13 0500  04/30/13 0437 05/01/13 0715 05/02/13 0450  WBC 16.9* 14.5*  < > 15.8* 15.7* 16.0*  NEUTROABS 12.0* 9.2*  --   --   --   --   HGB 9.8* 7.9*  < > 7.5* 8.1* 7.5*  HCT 30.5* 25.1*  < > 23.2* 25.2* 23.7*  MCV 78.8 78.9  < > 76.6* 76.6* 76.2*  PLT 284 214  < > 249 302 325  < > = values in this interval not displayed.   Assessment/Plan 1. Severe protein-calorie malnutrition -has gained considerable weight between tube feedings and po intake -advised he needs to continue this trend or at least maintain or tube may need to be replaced -he does not want the tube, but  does have goals to get stronger and go home -Daily weights, monitor intake--cont regular diet with chopped meats  2. Pressure ulcer, heel, left, unstageable -persists, cont prevalon boots and wound care per treatment nurse  3. Pressure ulcer, heel, right, unstageable -same as #2  4. Physical deconditioning -remains weak and stays in bed all day -would favor increasing his activity some if sacral ulcer permits  5. Pressure ulcer stage IV Of sacrum -remains but has improved Cont current treatment  6. Atrial fibrillation, unspecified -continues on amiodarone, baby asa -needs monitoring of tsh and liver panel   Family/ staff Communication: seen with unit supervisor  Goals of care: remains full code and aggressive treatment;  Goal remains to go home  Labs/tests ordered:  Daily weights, monitor intake--cont regular diet with chopped meats

## 2013-10-25 ENCOUNTER — Other Ambulatory Visit: Payer: Self-pay | Admitting: *Deleted

## 2013-10-25 MED ORDER — AMBULATORY NON FORMULARY MEDICATION: Status: DC

## 2013-10-25 NOTE — Telephone Encounter (Signed)
[facility] 

## 2013-10-28 ENCOUNTER — Non-Acute Institutional Stay (SKILLED_NURSING_FACILITY): Payer: Medicare Other | Admitting: Internal Medicine

## 2013-10-28 DIAGNOSIS — R634 Abnormal weight loss: Secondary | ICD-10-CM

## 2013-10-28 DIAGNOSIS — L89154 Pressure ulcer of sacral region, stage 4: Secondary | ICD-10-CM

## 2013-10-28 DIAGNOSIS — E43 Unspecified severe protein-calorie malnutrition: Secondary | ICD-10-CM

## 2013-10-28 NOTE — Progress Notes (Signed)
Patient ID: Craig GuerinWilliam [name], male   DOB: [birth year], 68 y.o.   MRN: [id number]    [facility]  Chief Complaint  Patient presents with  . Acute Visit    weight loss   No Known Allergies  HPI 68 y/o male patient is seen today for acute concern of weight loss. He has protein calorie malnutrition, sacral wound, had a peg tube which was pulled by patient. He weighed 158 lb in 09/13/13 and weighs 154 lb today. He is seen in his room and denies any concerns today. He mentions having a good appetite and denies any nausea or vomiting. Denies any abdominal pain. He has a colostomy bag and foley catheter. He continues to get wound care. He does not want re-evaluation for peg tube.   ROS Denies fever or chills Denies chest pain, dyspnea, palpitations Denies nausea or vomiting, abdominal pain. Has loose stool in ostomy bag  Past Medical History  Diagnosis Date  . COPD (chronic obstructive pulmonary disease)   . Hypertension   . Myocardial infarction   . A-fib   . GERD (gastroesophageal reflux disease)    [facility]  Physical exam BP 129/84  Pulse 76  Temp(Src) 98 F (36.7 C)  Resp 18  Ht 5\' 10"  (1.778 m)  Wt 154 lb (69.854 kg)  BMI 22.10 kg/m2  SpO2 96%  General- elderly frail male in no acute distress Head- atraumatic, normocephalic Eyes- PERRLA, EOMI, no pallor, no icterus Neck- no lymphadenopathy Cardiovascular- irregular rate, no murmurs/ rubs/ gallops Respiratory- bilateral decreased air entry but no wheeze/ crackles or rhonchi Abdomen- bowel sounds present, soft, non tender, colostomy bag in place, peg tube site open and has dressing in place Musculoskeletal- normal ROM, no edema Neurological- no focal deficit Psychiatry- alert and oriented to person, place Skin- sacral ulcer present  Assessment/plan  Weight loss Has severe protein calorie malnutrition. This along with his decubitus ulcer is likely contributing to his  weight loss. Decline is anticipated. He has decent appetite and is on nutritional supplement. He does not want a peg tube placed. Continue to monitor weight for now  Protein calorie malnutrition Continue 2 cal supplement, propass and decubvite. Monitor weight. Decline anticipated. Continue folic acid and thiamine  Sacral wound Continue pressure ulcer prophylaxis, skin care, decubvite.  If patient continues to lose weight and refuses alternative method of feeding, will need family meeting to consider comfort care.patient aware of this

## 2013-11-02 DIAGNOSIS — E43 Unspecified severe protein-calorie malnutrition: Secondary | ICD-10-CM | POA: Insufficient documentation

## 2013-11-02 DIAGNOSIS — R634 Abnormal weight loss: Secondary | ICD-10-CM | POA: Insufficient documentation

## 2013-11-02 DIAGNOSIS — L98429 Non-pressure chronic ulcer of back with unspecified severity: Secondary | ICD-10-CM | POA: Insufficient documentation

## 2013-12-16 ENCOUNTER — Non-Acute Institutional Stay (SKILLED_NURSING_FACILITY): Payer: Medicare Other | Admitting: Adult Health

## 2013-12-16 DIAGNOSIS — L89154 Pressure ulcer of sacral region, stage 4: Secondary | ICD-10-CM

## 2013-12-16 DIAGNOSIS — J449 Chronic obstructive pulmonary disease, unspecified: Secondary | ICD-10-CM

## 2013-12-16 DIAGNOSIS — E039 Hypothyroidism, unspecified: Secondary | ICD-10-CM

## 2013-12-16 DIAGNOSIS — F329 Major depressive disorder, single episode, unspecified: Secondary | ICD-10-CM

## 2013-12-16 DIAGNOSIS — E876 Hypokalemia: Secondary | ICD-10-CM

## 2013-12-16 DIAGNOSIS — F32A Depression, unspecified: Secondary | ICD-10-CM

## 2013-12-16 DIAGNOSIS — K219 Gastro-esophageal reflux disease without esophagitis: Secondary | ICD-10-CM

## 2013-12-16 DIAGNOSIS — I482 Chronic atrial fibrillation, unspecified: Secondary | ICD-10-CM

## 2013-12-20 ENCOUNTER — Encounter: Payer: Self-pay | Admitting: Adult Health

## 2013-12-20 DIAGNOSIS — E876 Hypokalemia: Secondary | ICD-10-CM | POA: Insufficient documentation

## 2013-12-20 NOTE — Progress Notes (Signed)
Patient ID: Craig [name], male   DOB: 03/13/1945, 68 y.o.   MRN: [id number]  [name]     No Known Allergies     Chief Complaint  Patient presents with  . Medical Management of Chronic Issues    HPI:  He is a long term resident of this facility being seen for the management of his chronic illnesses.  He is current stable. He is complaining of a long term cough with sputum production; his chest does not demonstrate infection present. He denies any sinus congestion. There are no nursing concerns at this time.    Past Medical History  Diagnosis Date  . COPD (chronic obstructive pulmonary disease)   . Hypertension   . Myocardial infarction   . A-fib   . GERD (gastroesophageal reflux disease)   . Hepatitis B 04/27/2013  . Dysphagia 04/27/2013  . Diverticulosis 03/05/2013  . Sepsis 04/26/2013       Past Surgical History  Procedure Laterality Date  . Colostomy    . Peg tube placement    . Peg tube removal       VITAL SIGNS BP 158/73 mmHg  Pulse 84  Ht 5\' 10"  (1.778 m)  Wt 162 lb (73.483 kg)  BMI 23.24 kg/m2  SpO2 96%   Outpatient Encounter Prescriptions as of 12/16/2013  Medication Sig  . amiodarone (PACERONE) 200 MG tablet Take 200 mg by mouth daily.  Marland Kitchen. aspirin EC 81 MG tablet Take 81 mg by mouth daily.  Marland Kitchen. escitalopram (LEXAPRO) 10 MG tablet Take 10 mg by mouth daily.  . Fluticasone Furoate-Vilanterol (BREO ELLIPTA) 100-25 MCG/INH AEPB Inhale 1 puff into the lungs daily.   . folic acid (FOLVITE) 1 MG tablet Take 1 mg by mouth daily.  . hydrOXYzine (ATARAX/VISTARIL) 25 MG tablet Take 25 mg by mouth every 6 (six) hours as needed for itching.  . K Phos Mono-Sod Phos Di & Mono (PHOSPHA 250 NEUTRAL PO) Take 250 mg by mouth 2 (two) times daily.   Marland Kitchen. lactulose (CHRONULAC) 10 GM/15ML solution Take 20 g by mouth daily.  . lansoprazole (PREVACID SOLUTAB) 30 MG disintegrating tablet Take 30 mg by mouth daily.  Marland Kitchen. levothyroxine (SYNTHROID, LEVOTHROID) 150 MCG  tablet Take 150 mcg by mouth daily before breakfast.  . Melatonin 3 MG TABS Take 1 tablet by mouth at bedtime.   . metoCLOPramide (REGLAN) 5 MG tablet Take 5 mg by mouth every 6 (six) hours.   . mirtazapine (REMERON) 7.5 MG tablet Take 7.5 mg by mouth at bedtime.  Marland Kitchen. morphine 20 MG/5ML solution Take 5 mg by mouth 2 (two) times daily. And every 6 hours as needed for pain  . Multiple Vitamins-Minerals (DECUBI-VITE PO) Take 1 capsule by mouth daily.  . potassium chloride (KLOR-CON) 20 MEQ packet 20 mEq by PEG Tube route 2 (two) times daily.  Marland Kitchen. thiamine 100 MG tablet Take 100 mg by mouth daily.  . [DISCONTINUED] AMBULATORY NON FORMULARY MEDICATION Morphine Sul Sol 100/305ml Sig: Take 0.6925ml per tube every 6 hours as needed for pain (Patient not taking: Reported on 12/20/2013)  . [DISCONTINUED] Carbomer Gel Base (HYDROGEL) GEL Apply 1 application topically every other day. To right ankle  . [DISCONTINUED] Enteral Nutrition Supplies MISC 185 mLs by Gastric Tube route every 4 (four) hours. Patency free water flushes  . [DISCONTINUED] Enteral Nutrition Supplies MISC 75 mLs by Gastric Tube route continuous. Osmalite at 75 ml from 8pm to 8am  . [DISCONTINUED] polyethylene glycol (MIRALAX / GLYCOLAX) packet Take 17 g  by mouth 3 (three) times daily.     SIGNIFICANT DIAGNOSTIC EXAMS  10-31-13: kub: no acute abnormality   LABS REVIEWED:   06-01-13: wbc 12.6; hgb 7.9; hct 25.3; mcv 75.1; plt 382; glucose 74; bun 16; creat 1.43; k+4.4; na++139    Review of Systems  Constitutional: Negative for malaise/fatigue.  Respiratory: Positive for cough and sputum production. Negative for shortness of breath.   Cardiovascular: Negative for chest pain.  Gastrointestinal: Negative for heartburn and abdominal pain.  Musculoskeletal: Negative for myalgias and joint pain.  Skin:       Has sacral ulcer   Psychiatric/Behavioral: The patient is not nervous/anxious.      Physical Exam  Constitutional: He appears  well-developed and well-nourished. No distress.  Neck: Neck supple. No JVD present.  Cardiovascular: Normal rate and regular rhythm.   Pedal pulses faint   Respiratory: Effort normal and breath sounds normal. No respiratory distress. He has no wheezes.  GI: Soft. Bowel sounds are normal. He exhibits no distension. There is no tenderness.  Colostomy   Genitourinary:  has foley   Musculoskeletal: He exhibits no edema.  Is able to move upper extremities Does not move lower extremities   Neurological: He is alert.  Skin: Skin is warm and dry. He is not diaphoretic.  Sacral wound 2.8 x 8.0 cm granulating wound bed will use hydrogel        ASSESSMENT/ PLAN:  1. Sacral stage IV: no signs of infection present; will use hydrogel daily to wound bed and will monitor his status   2. Afib: his heart rate is stable; will continue amiodarone 200 mg daily for rate control; will continue asa 81 mg daily will monitor his status.   3. Hypokalemia: will continue k+ 20 meq twice daily   4. Hypothyroidism: will continue synthroid 150 mcg daily   5. Gerd: will continue prevacid 30 mg daily and reglan 5 mg every 6 hours and will monitor  6.  COPD: he is stable is 02 dependent; will continue breo ellipta 100/25 daily   7. Depression: is emotionally stable will continue lexapro 10 mg daily; is taking remeron 7.5 mg nightly to help with appetite   8. Constipation: will continue lactulose 30 cc daily   9. Chronic pain: he states he is not having pain at this time; will continue roxanol 5 mg twice daily and every 6 hours as needed   10. Insomnia: will continue melatonin 3 mg nightly   Will stop the zinc Will check cbc; cmp tsh; phos; mag and ammonia levels     Time spent with patient 50 minutes   Synthia Innocenteborah Benjamin Merrihew NP [facility]  Contact [phone] Monday through Friday 8am- 5pm  After hours call [phone]

## 2013-12-25 ENCOUNTER — Encounter: Payer: Self-pay | Admitting: Internal Medicine

## 2014-01-19 ENCOUNTER — Non-Acute Institutional Stay (SKILLED_NURSING_FACILITY): Payer: Medicare Other | Admitting: Adult Health

## 2014-01-19 DIAGNOSIS — F329 Major depressive disorder, single episode, unspecified: Secondary | ICD-10-CM

## 2014-01-19 DIAGNOSIS — J449 Chronic obstructive pulmonary disease, unspecified: Secondary | ICD-10-CM

## 2014-01-19 DIAGNOSIS — G8929 Other chronic pain: Secondary | ICD-10-CM

## 2014-01-19 DIAGNOSIS — I482 Chronic atrial fibrillation, unspecified: Secondary | ICD-10-CM

## 2014-01-19 DIAGNOSIS — K219 Gastro-esophageal reflux disease without esophagitis: Secondary | ICD-10-CM

## 2014-01-19 DIAGNOSIS — F32A Depression, unspecified: Secondary | ICD-10-CM

## 2014-01-19 DIAGNOSIS — L89154 Pressure ulcer of sacral region, stage 4: Secondary | ICD-10-CM

## 2014-01-19 DIAGNOSIS — E039 Hypothyroidism, unspecified: Secondary | ICD-10-CM

## 2014-01-29 ENCOUNTER — Encounter: Payer: Self-pay | Admitting: Adult Health

## 2014-01-29 DIAGNOSIS — G8929 Other chronic pain: Secondary | ICD-10-CM | POA: Insufficient documentation

## 2014-01-29 NOTE — Progress Notes (Signed)
Patient ID: Craig [name], male   DOB: 1945/05/02, 69 y.o.   MRN: [id number]  [name] [place]     No Known Allergies     Chief Complaint  Patient presents with  . Medical Management of Chronic Issues    HPI:  He is a long term resident of this facility being seen for he management of his chronic illnesses. Overall his status is without change. He is not making any complaints or concerns at this time. There are no nursing concerns at this time.    Past Medical History  Diagnosis Date  . COPD (chronic obstructive pulmonary disease)   . Hypertension   . Myocardial infarction   . A-fib   . GERD (gastroesophageal reflux disease)   . Hepatitis B 04/27/2013  . Dysphagia 04/27/2013  . Diverticulosis 03/05/2013  . Sepsis 04/26/2013    Past Surgical History  Procedure Laterality Date  . Colostomy    . Peg tube placement    . Peg tube removal      VITAL SIGNS BP 132/70 mmHg  Pulse 68  Ht  (1.778 m)  Wt 162 lb (73.483 kg)  BMI 23.24 kg/m2  SpO2 95%   Outpatient Encounter Prescriptions as of 01/19/2014  Medication Sig  . amiodarone (PACERONE) 200 MG tablet Take 200 mg by mouth daily.  Marland Kitchen aspirin EC 81 MG tablet Take 81 mg by mouth daily.  Marland Kitchen escitalopram (LEXAPRO) 10 MG tablet Take 10 mg by mouth daily.  . Fluticasone Furoate-Vilanterol (BREO ELLIPTA) 100-25 MCG/INH AEPB Inhale 1 puff into the lungs daily.   . folic acid (FOLVITE) 1 MG tablet Take 1 mg by mouth daily.  . hydrOXYzine (ATARAX/VISTARIL) 25 MG tablet Take 25 mg by mouth every 6 (six) hours as needed for itching.  . K Phos Mono-Sod Phos Di & Mono (PHOSPHA 250 NEUTRAL PO) Take 250 mg by mouth 2 (two) times daily.   Marland Kitchen lactulose (CHRONULAC) 10 GM/15ML solution Take 20 g by mouth daily.  . lansoprazole (PREVACID SOLUTAB) 30 MG disintegrating tablet Take 30 mg by mouth daily.  Marland Kitchen levothyroxine (SYNTHROID, LEVOTHROID) 150 MCG tablet Take 150 mcg by mouth daily before breakfast.  . Melatonin 3 MG TABS Take  1 tablet by mouth at bedtime.   . metoCLOPramide (REGLAN) 5 MG tablet Take 5 mg by mouth every 6 (six) hours.   . mirtazapine (REMERON) 7.5 MG tablet Take 7.5 mg by mouth at bedtime.  Marland Kitchen morphine 20 MG/5ML solution Take 5 mg by mouth 2 (two) times daily. And every 6 hours as needed for pain  . Multiple Vitamins-Minerals (DECUBI-VITE PO) Take 1 capsule by mouth daily.  . potassium chloride (KLOR-CON) 20 MEQ packet 20 mEq by PEG Tube route 2 (two) times daily.  Marland Kitchen thiamine 100 MG tablet Take 100 mg by mouth daily.     SIGNIFICANT DIAGNOSTIC EXAMS   10-31-13: kub: no acute abnormality  12-16-13: chest x-ray; right lower lobe atelectasis   LABS REVIEWED:   06-01-13: wbc 12.6; hgb 7.9; hct 25.3; mcv 75.1; plt 382; glucose 74; bun 16; creat 1.43; k+4.4; na++139 12-19-13: wbc 12.7; hg 8.1; hct 28.3; mcv 65.2; plt 347;glucose 70; bun 18; creat 1.52; k+5.5; na++136; liver normal albumin 2.8; phos 4.0; mag 1.5; tsh 0.028 ammonia 56;  12-28-13: glucose 87; bun 20; creat 1.5; k+4.7; na++140         ROS Constitutional: Negative for malaise/fatigue.  Respiratory: Positive for cough and sputum production. Negative for shortness of breath.   Cardiovascular: Negative  for chest pain.  Gastrointestinal: Negative for heartburn and abdominal pain.  Musculoskeletal: Negative for myalgias and joint pain.  Skin:       Has sacral ulcer   Psychiatric/Behavioral: The patient is not nervous/anxious    Physical Exam  Constitutional: He appears well-developed and well-nourished. No distress.  Neck: Neck supple. No JVD present.  Cardiovascular: Normal rate and regular rhythm.   Pedal pulses faint   Respiratory: Effort normal and breath sounds normal. No respiratory distress. He has no wheezes.  GI: Soft. Bowel sounds are normal. He exhibits no distension. There is no tenderness.  Colostomy   Genitourinary:  has foley   Musculoskeletal: He exhibits no edema.  Is able to move upper extremities Does  not move lower extremities   Neurological: He is alert.  Skin: Skin is warm and dry. He is not diaphoretic.  Sacral wound without signs of infection present.      ASSESSMENT/ PLAN:   1. 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 Afib: his heart rate is stable; will continue amiodarone 200 mg daily for rate control; will continue asa 81 mg daily will monitor his status.   3. Hypokalemia: will continue k+ 20 meq twice daily   4. Hypothyroidism: will continue synthroid 125 mcg daily last tsh was 0.028   5. Gerd: will continue prevacid 30 mg daily and reglan 5 mg every 6 hours and will monitor  6.  COPD: he is stable is 02 dependent; will continue breo ellipta 100/25 daily   7. Depression: is emotionally stable will continue lexapro 10 mg daily; is taking remeron 7.5 mg nightly to help with appetite   8. Constipation: will continue lactulose 30 cc daily   9. Chronic pain: he states he is not having pain at this time; will continue roxanol 5 mg twice daily and every 6 hours as needed   10. Insomnia: will continue melatonin 3 mg nightly       Synthia Innocenteborah Caycee Wanat NP [facility]  Contact [phone] Monday through Friday 8am- 5pm  After hours call [phone]

## 2014-03-17 ENCOUNTER — Non-Acute Institutional Stay (SKILLED_NURSING_FACILITY): Payer: Medicare Other | Admitting: Adult Health

## 2014-03-17 DIAGNOSIS — I482 Chronic atrial fibrillation, unspecified: Secondary | ICD-10-CM

## 2014-03-17 DIAGNOSIS — E876 Hypokalemia: Secondary | ICD-10-CM

## 2014-03-17 DIAGNOSIS — K219 Gastro-esophageal reflux disease without esophagitis: Secondary | ICD-10-CM

## 2014-03-17 DIAGNOSIS — J449 Chronic obstructive pulmonary disease, unspecified: Secondary | ICD-10-CM

## 2014-03-17 DIAGNOSIS — E039 Hypothyroidism, unspecified: Secondary | ICD-10-CM | POA: Diagnosis not present

## 2014-03-17 DIAGNOSIS — G8929 Other chronic pain: Secondary | ICD-10-CM | POA: Diagnosis not present

## 2014-03-17 DIAGNOSIS — L89154 Pressure ulcer of sacral region, stage 4: Secondary | ICD-10-CM | POA: Diagnosis not present

## 2014-04-03 ENCOUNTER — Encounter: Payer: Self-pay | Admitting: Adult Health

## 2014-04-03 NOTE — Progress Notes (Signed)
Patient ID: Craig GuerinWilliam Hirata, male   DOB: 1945-01-25, 69 y.o.   MRN: [id number]  [name] [place]     No Known Allergies     Chief Complaint  Patient presents with  . Medical Management of Chronic Issues    HPI:  He is a long term resident of this facility being seen for the management of his chronic illnesses. He has been declining his lactulose; melatonin synthroid and supplements. He states he is taking the medications he wants; and does not want to take any more supplements at this time. He is not making any complaints at this time.    Past Medical History  Diagnosis Date  . COPD (chronic obstructive pulmonary disease)   . Hypertension   . Myocardial infarction   . A-fib   . GERD (gastroesophageal reflux disease)   . Hepatitis B 04/27/2013  . Dysphagia 04/27/2013  . Diverticulosis 03/05/2013  . Sepsis 04/26/2013    Past Surgical History  Procedure Laterality Date  . Colostomy    . Peg tube placement    . Peg tube removal      VITAL SIGNS BP 140/76 mmHg  Pulse 72  Ht 5\' 10"  (1.778 m)  Wt 180 lb (81.647 kg)  BMI 25.83 kg/m2  SpO2 96%   Outpatient Encounter Prescriptions as of 03/17/2014  Medication Sig  . amiodarone (PACERONE) 200 MG tablet Take 200 mg by mouth daily.  Marland Kitchen. aspirin EC 81 MG tablet Take 81 mg by mouth daily.  Marland Kitchen. escitalopram (LEXAPRO) 10 MG tablet Take 10 mg by mouth daily.  . Fluticasone Furoate-Vilanterol (BREO ELLIPTA) 100-25 MCG/INH AEPB Inhale 1 puff into the lungs daily.   . folic acid (FOLVITE) 1 MG tablet Take 1 mg by mouth daily.  . hydrOXYzine (ATARAX/VISTARIL) 25 MG tablet Take 25 mg by mouth every 6 (six) hours as needed for itching.  . K Phos Mono-Sod Phos Di & Mono (PHOSPHA 250 NEUTRAL PO) Take 250 mg by mouth 2 (two) times daily.   Marland Kitchen. lactulose (CHRONULAC) 10 GM/15ML solution Take 20 g by mouth daily.  . lansoprazole (PREVACID SOLUTAB) 30 MG disintegrating tablet Take 30 mg by mouth daily.  Marland Kitchen. levothyroxine (SYNTHROID,  LEVOTHROID) 150 MCG tablet Take 150 mcg by mouth daily before breakfast.  . Melatonin 3 MG TABS Take 1 tablet by mouth at bedtime.   . metoCLOPramide (REGLAN) 5 MG tablet Take 5 mg by mouth every 6 (six) hours.   . mirtazapine (REMERON) 7.5 MG tablet Take 7.5 mg by mouth at bedtime.  Marland Kitchen. morphine 20 MG/5ML solution Take 5 mg by mouth 2 (two) times daily. And every 6 hours as needed for pain  . Multiple Vitamins-Minerals (DECUBI-VITE PO) Take 1 capsule by mouth daily.  . potassium chloride (KLOR-CON) 20 MEQ packet 20 mEq by PEG Tube route 2 (two) times daily.  Marland Kitchen. thiamine 100 MG tablet Take 100 mg by mouth daily.     SIGNIFICANT DIAGNOSTIC EXAMS   10-31-13: kub: no acute abnormality  12-16-13: chest x-ray; right lower lobe atelectasis   LABS REVIEWED:   06-01-13: wbc 12.6; hgb 7.9; hct 25.3; mcv 75.1; plt 382; glucose 74; bun 16; creat 1.43; k+4.4; na++139 12-19-13: wbc 12.7; hg 8.1; hct 28.3; mcv 65.2; plt 347;glucose 70; bun 18; creat 1.52; k+5.5; na++136; liver normal albumin 2.8; phos 4.0; mag 1.5; tsh 0.028 ammonia 56;  12-28-13: glucose 87; bun 20; creat 1.5; k+4.7; na++140 02-08-14: tsh 0.372       ROS Constitutional: Negative for malaise/fatigue.  Respiratory: Positive for cough and sputum production. Negative for shortness of breath.   Cardiovascular: Negative for chest pain.  Gastrointestinal: Negative for heartburn and abdominal pain.  Musculoskeletal: Negative for myalgias and joint pain.  Skin:       Has sacral ulcer   Psychiatric/Behavioral: The patient is not nervous/anxious    Physical Exam Constitutional: He appears well-developed and well-nourished. No distress.  Neck: Neck supple. No JVD present.  Cardiovascular: Normal rate and regular rhythm.   Pedal pulses faint   Respiratory: Effort normal and breath sounds normal. No respiratory distress. He has no wheezes.  GI: Soft. Bowel sounds are normal. He exhibits no distension. There is no tenderness.    Colostomy   Genitourinary:  has foley   Musculoskeletal: He exhibits no edema.  Is able to move upper extremities Does not move lower extremities   Neurological: He is alert.  Skin: Skin is warm and dry. He is not diaphoretic.  Sacral wound without signs of infection present.     ASSESSMENT/ PLAN:   1. 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 Afib: his heart rate is stable; will continue amiodarone 200 mg daily for rate control; will continue asa 81 mg daily will monitor his status.   3. Hypokalemia: will continue k+ 20 meq twice daily   4. Hypothyroidism: will continue synthroid 125 mcg daily last tsh was 0.372. Will change the time of day to the routine am medications.   5. Genella Rife: will continue prevacid 30 mg daily and reglan 5 mg every 6 hours and will monitor  6.  COPD: he is stable is 02 dependent; will continue breo ellipta 100/25 daily   7. Depression: is emotionally stable will continue lexapro 10 mg daily; is taking remeron 7.5 mg nightly to help with appetite   8. Constipation: will stop lactulose at this time.   9. Chronic pain: he states he is not having pain at this time; will continue roxanol 5 mg twice daily and every 6 hours as needed   10. Insomnia: will stop  melatonin 3 mg    Will stop house supplements  Time spent with patient 45 minutes.    Synthia Innocent NP [facility]  Contact [phone] Monday through Friday 8am- 5pm  After hours call [phone]

## 2014-04-24 ENCOUNTER — Non-Acute Institutional Stay (SKILLED_NURSING_FACILITY): Payer: Medicare Other | Admitting: Adult Health

## 2014-04-24 DIAGNOSIS — K219 Gastro-esophageal reflux disease without esophagitis: Secondary | ICD-10-CM | POA: Diagnosis not present

## 2014-04-24 DIAGNOSIS — L89154 Pressure ulcer of sacral region, stage 4: Secondary | ICD-10-CM

## 2014-04-24 DIAGNOSIS — I482 Chronic atrial fibrillation, unspecified: Secondary | ICD-10-CM

## 2014-04-24 DIAGNOSIS — E876 Hypokalemia: Secondary | ICD-10-CM | POA: Diagnosis not present

## 2014-04-24 DIAGNOSIS — E039 Hypothyroidism, unspecified: Secondary | ICD-10-CM | POA: Diagnosis not present

## 2014-04-24 DIAGNOSIS — F32A Depression, unspecified: Secondary | ICD-10-CM

## 2014-04-24 DIAGNOSIS — F329 Major depressive disorder, single episode, unspecified: Secondary | ICD-10-CM | POA: Diagnosis not present

## 2014-04-24 DIAGNOSIS — J449 Chronic obstructive pulmonary disease, unspecified: Secondary | ICD-10-CM | POA: Diagnosis not present

## 2014-04-24 DIAGNOSIS — G8929 Other chronic pain: Secondary | ICD-10-CM

## 2014-05-29 ENCOUNTER — Non-Acute Institutional Stay (SKILLED_NURSING_FACILITY): Payer: Medicare Other | Admitting: Adult Health

## 2014-05-29 DIAGNOSIS — E039 Hypothyroidism, unspecified: Secondary | ICD-10-CM

## 2014-05-29 DIAGNOSIS — G8929 Other chronic pain: Secondary | ICD-10-CM | POA: Diagnosis not present

## 2014-05-29 DIAGNOSIS — F329 Major depressive disorder, single episode, unspecified: Secondary | ICD-10-CM | POA: Diagnosis not present

## 2014-05-29 DIAGNOSIS — E876 Hypokalemia: Secondary | ICD-10-CM

## 2014-05-29 DIAGNOSIS — K219 Gastro-esophageal reflux disease without esophagitis: Secondary | ICD-10-CM

## 2014-05-29 DIAGNOSIS — I482 Chronic atrial fibrillation, unspecified: Secondary | ICD-10-CM

## 2014-05-29 DIAGNOSIS — J449 Chronic obstructive pulmonary disease, unspecified: Secondary | ICD-10-CM

## 2014-05-29 DIAGNOSIS — F32A Depression, unspecified: Secondary | ICD-10-CM

## 2014-07-03 NOTE — Progress Notes (Signed)
Patient ID: Craig [name], male   DOB: 1945/03/06, 69 y.o.   MRN: [id number]  [name] [place]     No Known Allergies     Chief Complaint  Patient presents with  . Annual Exam    HPI:  He is a long term resident of this facility being seen for his annual exam. He has remained stable over the past year; he has gained weight. He has not been hospitalized in the past several months. He is not voicing any complaints or concerns today. There are no nursing concerns at this time.    Past Medical History  Diagnosis Date  . COPD (chronic obstructive pulmonary disease)   . Hypertension   . Myocardial infarction   . A-fib   . GERD (gastroesophageal reflux disease)   . Hepatitis B 04/27/2013  . Dysphagia 04/27/2013  . Diverticulosis 03/05/2013  . Sepsis 04/26/2013    Past Surgical History  Procedure Laterality Date  . Colostomy    . Peg tube placement    . Peg tube removal     CONSULTS:  Dental  Foot   History   Social History  . Marital Status: Unknown    Spouse Name: N/A  . Number of Children: N/A  . Years of Education: N/A   Occupational History  . Not on file.   Social History Main Topics  . Smoking status: Former Games developer  . Smokeless tobacco: Not on file  . Alcohol Use: No  . Drug Use: No  . Sexual Activity: Not Currently   Other Topics Concern  . Not on file   Social History Narrative     VITAL SIGNS BP 140/82 mmHg  Pulse 78  Ht 5\' 10"  (1.778 m)  Wt 181 lb (82.101 kg)  BMI 25.97 kg/m2  SpO2 95%   Outpatient Encounter Prescriptions as of 04/24/2014  Medication Sig  . amiodarone (PACERONE) 200 MG tablet Take 200 mg by mouth daily.  Marland Kitchen aspirin EC 81 MG tablet Take 81 mg by mouth daily.  Marland Kitchen escitalopram (LEXAPRO) 10 MG tablet Take 10 mg by mouth daily.  . Fluticasone Furoate-Vilanterol (BREO ELLIPTA) 100-25 MCG/INH AEPB Inhale 1 puff into the lungs daily.   . folic acid (FOLVITE) 1 MG tablet Take 1 mg by mouth daily.  . hydrOXYzine  (ATARAX/VISTARIL) 25 MG tablet Take 25 mg by mouth every 6 (six) hours as needed for itching.  . K Phos Mono-Sod Phos Di & Mono (PHOSPHA 250 NEUTRAL PO) Take 250 mg by mouth 2 (two) times daily.   . lansoprazole (PREVACID SOLUTAB) 30 MG disintegrating tablet Take 30 mg by mouth daily.  Marland Kitchen levothyroxine (SYNTHROID, LEVOTHROID) 150 MCG tablet Take 125 mcg by mouth daily before breakfast.   . Melatonin 3 MG TABS Take 1 tablet by mouth at bedtime as needed.   . metoCLOPramide (REGLAN) 5 MG tablet Take 5 mg by mouth every 6 (six) hours as needed for nausea.   . mirtazapine (REMERON) 7.5 MG tablet Take 7.5 mg by mouth at bedtime.  Marland Kitchen morphine 20 MG/5ML solution Take 5 mg by mouth every 6 (six) hours as needed.   . Multiple Vitamins-Minerals (DECUBI-VITE PO) Take 1 capsule by mouth daily.  Marland Kitchen thiamine 100 MG tablet Take 100 mg by mouth daily.      SIGNIFICANT DIAGNOSTIC EXAMS  10-31-13: kub: no acute abnormality  12-16-13: chest x-ray; right lower lobe atelectasis   LABS REVIEWED:   06-01-13: wbc 12.6; hgb 7.9; hct 25.3; mcv 75.1; plt 382; glucose 74;  bun 16; creat 1.43; k+4.4; na++139 12-19-13: wbc 12.7; hg 8.1; hct 28.3; mcv 65.2; plt 347;glucose 70; bun 18; creat 1.52; k+5.5; na++136; liver normal albumin 2.8; phos 4.0; mag 1.5; tsh 0.028 ammonia 56;  12-28-13: glucose 87; bun 20; creat 1.5; k+4.7; na++140 02-08-14: tsh 0.372      ROS Constitutional: Negative for malaise/fatigue.  Respiratory: negative for cough . Negative for shortness of breath.   Cardiovascular: Negative for chest pain.  Gastrointestinal: Negative for heartburn and abdominal pain.  Musculoskeletal: Negative for myalgias and joint pain.  Skin: no complaints  Psychiatric/Behavioral: The patient is not nervous/anxious     Physical Exam Constitutional: He appears well-developed and well-nourished. No distress.  Neck: Neck supple. No JVD present.  Cardiovascular: Normal rate and regular rhythm.   Pedal pulses faint    Respiratory: Effort normal and breath sounds normal. No respiratory distress. He has no wheezes.  GI: Soft. Bowel sounds are normal. He exhibits no distension. There is no tenderness.  Colostomy   Genitourinary:  has foley   Musculoskeletal: He exhibits no edema.  Is able to move upper extremities Does not move lower extremities   Neurological: He is alert.  Skin: Skin is warm and dry. He is not diaphoretic.  Sacral wound has resolved.    ASSESSMENT/ PLAN:  1. Sacral stage IV: has resolved  2. Afib: his heart rate is stable; will continue amiodarone 200 mg daily for rate control; will continue asa 81 mg daily will monitor his status.   3. Hypokalemia: stable not on medications will monitor   4. Hypothyroidism: will continue synthroid 125 mcg daily last tsh was 0.372. Will monitor  5. Gerd: will continue prevacid 30 mg daily and reglan 5 mg every 6 hours as needed and will monitor  6.  COPD: he is stable is 02 dependent; will continue breo ellipta 100/25 daily   7. Depression: is emotionally stable will continue lexapro 10 mg daily; is taking remeron 7.5 mg nightly to help with appetite; he has gained weight from 154 pounds in Oct to 181 pounds; will stop this medications    8. Constipation: is presently not on medications will monitor.   9. Chronic pain: he states he is not having pain at this time; will continue roxanol 5 mg  every 6 hours as needed   10. Insomnia: will continue melatonin 3 mg as needed at night   Will check phosphorus bmp and psa Health maintenance is otherwise up to date for this patient.        Synthia Innocent NP [facility]  Contact [phone] Monday through Friday 8am- 5pm  After hours call [phone]

## 2014-07-04 ENCOUNTER — Non-Acute Institutional Stay (SKILLED_NURSING_FACILITY): Payer: Medicare Other | Admitting: Internal Medicine

## 2014-07-04 ENCOUNTER — Encounter: Payer: Self-pay | Admitting: Internal Medicine

## 2014-07-04 DIAGNOSIS — E039 Hypothyroidism, unspecified: Secondary | ICD-10-CM

## 2014-07-04 DIAGNOSIS — F32A Depression, unspecified: Secondary | ICD-10-CM

## 2014-07-04 DIAGNOSIS — J301 Allergic rhinitis due to pollen: Secondary | ICD-10-CM | POA: Diagnosis not present

## 2014-07-04 DIAGNOSIS — F329 Major depressive disorder, single episode, unspecified: Secondary | ICD-10-CM | POA: Diagnosis not present

## 2014-07-04 DIAGNOSIS — I482 Chronic atrial fibrillation, unspecified: Secondary | ICD-10-CM

## 2014-07-04 DIAGNOSIS — J449 Chronic obstructive pulmonary disease, unspecified: Secondary | ICD-10-CM | POA: Diagnosis not present

## 2014-07-04 DIAGNOSIS — E43 Unspecified severe protein-calorie malnutrition: Secondary | ICD-10-CM | POA: Diagnosis not present

## 2014-07-04 DIAGNOSIS — G8929 Other chronic pain: Secondary | ICD-10-CM | POA: Diagnosis not present

## 2014-07-04 NOTE — Progress Notes (Signed)
Patient ID: Craig [name], male   DOB: February 22, 1945, 69 y.o.   MRN: [id number]    DATE: 07/04/14  Location:  [facility]    Place of Service: SNF [phone])   Extended Emergency Contact Information Primary Emergency Contact: Mable Fill [place] Home Phone: [phone] Relation: Friend  Advanced Directive information  FULL CODE  Chief Complaint  Patient presents with  . Medical Management of Chronic Issues    c/o URI sx's    HPI:  69 yo male long term resident seen today for f/u. He c/o intermittent sneezing, occasional; cough with green sputum pdtn and DOE. He wears [place] O2 ATC. No f/c. No CP. No sick contacts. Hx COPD and uses breo  HTN - BP controlled  Hx MI/afib - rate controlled on amiodarone. He takes ASA daily  GERD - sx's stable on PPI  Chronic pain - stable on roxanol  Depression - stable on lexapro  He has vitamin/mineral supplements and nutritional supplements  Past Medical History  Diagnosis Date  . COPD (chronic obstructive pulmonary disease)   . Hypertension   . Myocardial infarction   . A-fib   . GERD (gastroesophageal reflux disease)   . Hepatitis B 04/27/2013  . Dysphagia 04/27/2013  . Diverticulosis 03/05/2013  . Sepsis 04/26/2013    Past Surgical History  Procedure Laterality Date  . Colostomy    . Peg tube placement    . Peg tube removal      Patient Care Team: Kirt Boys, DO as PCP - General (Internal Medicine)  History   Social History  . Marital Status: Unknown    Spouse Name: N/A  . Number of Children: N/A  . Years of Education: N/A   Occupational History  . Not on file.   Social History Main Topics  . Smoking status: Former Games developer  . Smokeless tobacco: Not on file  . Alcohol Use: No  . Drug Use: No  . Sexual Activity: Not Currently   Other Topics Concern  . Not on file   Social History Narrative     reports that he has quit smoking. He does not have any smokeless tobacco  history on file. He reports that he does not drink alcohol or use illicit drugs.   There is no immunization history on file for this patient.  No Known Allergies  Medications: Patient's Medications  New Prescriptions   No medications on file  Previous Medications   AMIODARONE (PACERONE) 200 MG TABLET    Take 200 mg by mouth daily.   ASPIRIN EC 81 MG TABLET    Take 81 mg by mouth daily.   ESCITALOPRAM (LEXAPRO) 10 MG TABLET    Take 10 mg by mouth daily.   FLUTICASONE FUROATE-VILANTEROL (BREO ELLIPTA) 100-25 MCG/INH AEPB    Inhale 1 puff into the lungs daily.    FOLIC ACID (FOLVITE) 1 MG TABLET    Take 1 mg by mouth daily.   HYDROXYZINE (ATARAX/VISTARIL) 25 MG TABLET    Take 25 mg by mouth every 6 (six) hours as needed for itching.   K PHOS MONO-SOD PHOS DI & MONO (PHOSPHA 250 NEUTRAL PO)    Take 250 mg by mouth 2 (two) times daily.    LANSOPRAZOLE (PREVACID SOLUTAB) 30 MG DISINTEGRATING TABLET    Take 30 mg by mouth daily.   LEVOTHYROXINE (SYNTHROID, LEVOTHROID) 150 MCG TABLET    Take 125 mcg by mouth daily before breakfast.    MELATONIN 3 MG TABS  Take 1 tablet by mouth at bedtime as needed.    METOCLOPRAMIDE (REGLAN) 5 MG TABLET    Take 5 mg by mouth every 6 (six) hours as needed for nausea.    MORPHINE 20 MG/5ML SOLUTION    Take 5 mg by mouth every 6 (six) hours as needed.    MULTIPLE VITAMINS-MINERALS (DECUBI-VITE PO)    Take 1 capsule by mouth daily.   THIAMINE 100 MG TABLET    Take 100 mg by mouth daily.  Modified Medications   No medications on file  Discontinued Medications   No medications on file    Review of Systems  Unable to perform ROS: Psychiatric disorder    Filed Vitals:   07/04/14 1744  BP: 132/70  Pulse: 86  Temp: 97.5 F (36.4 C)  SpO2: 94%   There is no weight on file to calculate BMI.  Physical Exam  Constitutional: He appears well-developed.  Frail appearing, Lying in bed in NAD. [place] O2 intact  HENT:  Mouth/Throat: Oropharynx is clear and  moist.  Eyes: Pupils are equal, round, and reactive to light. No scleral icterus.  Neck: Neck supple. Carotid bruit is not present. No thyromegaly present.  Cardiovascular: Normal rate, regular rhythm and intact distal pulses.  Exam reveals no gallop and no friction rub.   Murmur (1/6 SEM) heard. no distal LE swelling. No calf TTP  Pulmonary/Chest: Effort normal. He has wheezes (end expiratory). He has no rales. He exhibits no tenderness.  Reduced BS b/l at base   Abdominal: Soft. Bowel sounds are normal. He exhibits no distension, no abdominal bruit, no pulsatile midline mass and no mass. There is no tenderness. There is no rebound and no guarding.  Musculoskeletal: He exhibits edema and tenderness.  Lymphadenopathy:    He has no cervical adenopathy.  Neurological: He is alert.  Skin: Skin is warm and dry. No rash noted.  Psychiatric: He has a normal mood and affect. His behavior is normal.     Labs reviewed: No visits with results within 3 Month(s) from this visit. Latest known visit with results is:  Admission on 04/26/2013, Discharged on 05/03/2013  No results displayed because visit has over 200 results.      No results found.   Assessment/Plan   ICD-9-CM ICD-10-CM   1. Allergic rhinitis due to pollen - new 477.0 J30.1   2. Chronic obstructive pulmonary disease, unspecified COPD, unspecified chronic bronchitis type - stable 496 J44.9   3. Chronic atrial fibrillation - rate controlled 427.31 I48.2   4. Severe protein-calorie malnutrition - unchanged 262 E43   5. Hypothyroidism, unspecified hypothyroidism type - stable 244.9 E03.9   6. Depression - stable 311 F32.9   7. Chronic pain - stable 338.29 G89.29     --start zyrtec  daily for seasonal allergy  --cont other meds as ordered  --colostomy care as indicated  --cont nutritional supplement as indicated  --will follow  Markham Dumlao S. Ancil Linsey  [facility] [address] [place], [place] [address] [phone] Cell (Monday-Friday 8 AM - 5 PM) [phone] After 5 PM and follow prompts

## 2014-07-17 NOTE — Progress Notes (Signed)
Patient ID: Craig [name], male   DOB: January 26, 1945, 69 y.o.   MRN: [id number]  [facility] [name] living [place]     No Known Allergies     Chief Complaint  Patient presents with  . Medical Management of Chronic Issues    HPI:  He is a long term resident of this facility being seen for the management of his chronic illnesses. Overall his status is stable he is not voicing any complaints or concerns today. There are no nursing concerns being voiced today.    Past Medical History  Diagnosis Date  . COPD (chronic obstructive pulmonary disease)   . Hypertension   . Myocardial infarction   . A-fib   . GERD (gastroesophageal reflux disease)   . Hepatitis B 04/27/2013  . Dysphagia 04/27/2013  . Diverticulosis 03/05/2013  . Sepsis 04/26/2013    Past Surgical History  Procedure Laterality Date  . Colostomy    . Peg tube placement    . Peg tube removal      VITAL SIGNS BP 130/72 mmHg  Pulse 82  Ht  (1.778 m)  Wt 181 lb (82.101 kg)  BMI 25.97 kg/m2  SpO2 92%   Outpatient Encounter Prescriptions as of 05/29/2014  Medication Sig  . amiodarone (PACERONE) 200 MG tablet Take 200 mg by mouth daily.  Marland Kitchen aspirin EC 81 MG tablet Take 81 mg by mouth daily.  Marland Kitchen escitalopram (LEXAPRO) 10 MG tablet Take 10 mg by mouth daily.  . Fluticasone Furoate-Vilanterol (BREO ELLIPTA) 100-25 MCG/INH AEPB Inhale 1 puff into the lungs daily.   . folic acid (FOLVITE) 1 MG tablet Take 1 mg by mouth daily.  . hydrOXYzine (ATARAX/VISTARIL) 25 MG tablet Take 25 mg by mouth every 6 (six) hours as needed for itching.  . K Phos Mono-Sod Phos Di & Mono (PHOSPHA 250 NEUTRAL PO) Take 250 mg by mouth 2 (two) times daily.   . lansoprazole (PREVACID SOLUTAB) 30 MG disintegrating tablet Take 30 mg by mouth daily.  Marland Kitchen levothyroxine (SYNTHROID, LEVOTHROID) 150 MCG tablet Take 125 mcg by mouth daily before breakfast.   . Melatonin 3 MG TABS Take 1 tablet by mouth at bedtime as needed.   . metoCLOPramide (REGLAN) 5 MG  tablet Take 5 mg by mouth every 6 (six) hours as needed for nausea.   Marland Kitchen morphine 20 MG/5ML solution Take 5 mg by mouth every 6 (six) hours as needed.   . Multiple Vitamins-Minerals (DECUBI-VITE PO) Take 1 capsule by mouth daily.  Marland Kitchen thiamine 100 MG tablet Take 100 mg by mouth daily.      SIGNIFICANT DIAGNOSTIC EXAMS   10-31-13: kub: no acute abnormality  12-16-13: chest x-ray; right lower lobe atelectasis   LABS REVIEWED:   06-01-13: wbc 12.6; hgb 7.9; hct 25.3; mcv 75.1; plt 382; glucose 74; bun 16; creat 1.43; k+4.4; na++139 12-19-13: wbc 12.7; hg 8.1; hct 28.3; mcv 65.2; plt 347;glucose 70; bun 18; creat 1.52; k+5.5; na++136; liver normal albumin 2.8; phos 4.0; mag 1.5; tsh 0.028 ammonia 56;  12-28-13: glucose 87; bun 20; creat 1.5; k+4.7; na++140 02-08-14: tsh 0.372  04-26-14: glucose 81; bun 17; creat 1.33; k+4.7; na++139; phos 4.5; psa 1.24     ROS Constitutional: Negative for malaise/fatigue.  Respiratory: negative for cough . Negative for shortness of breath.   Cardiovascular: Negative for chest pain.  Gastrointestinal: Negative for heartburn and abdominal pain.  Musculoskeletal: Negative for myalgias and joint pain.  Skin: no complaints  Psychiatric/Behavioral: The patient is not nervous/anxious  Physical Exam Constitutional: He appears well-developed and well-nourished. No distress.  Neck: Neck supple. No JVD present.  Cardiovascular: Normal rate and regular rhythm.   Pedal pulses faint   Respiratory: Effort normal and breath sounds normal. No respiratory distress. He has no wheezes.  GI: Soft. Bowel sounds are normal. He exhibits no distension. There is no tenderness.  Colostomy   Genitourinary:  has foley   Musculoskeletal: He exhibits no edema.  Is able to move upper extremities Does not move lower extremities   Neurological: He is alert.  Skin: Skin is warm and dry. He is not diaphoretic.        ASSESSMENT/ PLAN:  1. Afib: his heart rate is  stable; will continue amiodarone 200 mg daily for rate control; will continue asa 81 mg daily will monitor his status.   2. Hypokalemia: stable not on medications will monitor   3. Hypothyroidism: will continue synthroid 125 mcg daily last tsh was 0.372. Will monitor  4. Gerd: will continue prevacid 30 mg daily and reglan 5 mg every 6 hours as needed and will monitor  5.  COPD: he is stable is 02 dependent; will continue breo ellipta 100/25 daily   6. Depression: is emotionally stable will continue lexapro 10 mg daily; his weight is stable at 181 pounds    7. Constipation: is presently not on medications will monitor.   8. Chronic pain: he states he is not having pain at this time; will continue roxanol 5 mg  every 6 hours as needed   9. Insomnia: will continue melatonin 3 mg as needed at night     Synthia Innocenteborah Gwynevere Lizana NP [facility]  Contact [phone] Monday through Friday 8am- 5pm  After hours call [phone]

## 2014-08-11 ENCOUNTER — Non-Acute Institutional Stay (SKILLED_NURSING_FACILITY): Payer: Medicare Other | Admitting: Adult Health

## 2014-08-11 DIAGNOSIS — F329 Major depressive disorder, single episode, unspecified: Secondary | ICD-10-CM | POA: Diagnosis not present

## 2014-08-11 DIAGNOSIS — I482 Chronic atrial fibrillation, unspecified: Secondary | ICD-10-CM

## 2014-08-11 DIAGNOSIS — K219 Gastro-esophageal reflux disease without esophagitis: Secondary | ICD-10-CM

## 2014-08-11 DIAGNOSIS — J449 Chronic obstructive pulmonary disease, unspecified: Secondary | ICD-10-CM

## 2014-08-11 DIAGNOSIS — G4700 Insomnia, unspecified: Secondary | ICD-10-CM

## 2014-08-11 DIAGNOSIS — E876 Hypokalemia: Secondary | ICD-10-CM | POA: Diagnosis not present

## 2014-08-11 DIAGNOSIS — E039 Hypothyroidism, unspecified: Secondary | ICD-10-CM

## 2014-08-11 DIAGNOSIS — F32A Depression, unspecified: Secondary | ICD-10-CM

## 2014-08-26 ENCOUNTER — Encounter (HOSPITAL_COMMUNITY): Payer: Self-pay | Admitting: Emergency Medicine

## 2014-08-26 ENCOUNTER — Emergency Department (HOSPITAL_COMMUNITY)
Admission: EM | Admit: 2014-08-26 | Discharge: 2014-08-27 | Disposition: A | Discharge status: Home / Self Care | Payer: Medicare Other | Attending: Emergency Medicine | Admitting: Emergency Medicine

## 2014-08-26 ENCOUNTER — Emergency Department (HOSPITAL_COMMUNITY): Payer: Medicare Other

## 2014-08-26 DIAGNOSIS — Z8619 Personal history of other infectious and parasitic diseases: Secondary | ICD-10-CM | POA: Diagnosis not present

## 2014-08-26 DIAGNOSIS — I10 Essential (primary) hypertension: Secondary | ICD-10-CM | POA: Insufficient documentation

## 2014-08-26 DIAGNOSIS — Z87891 Personal history of nicotine dependence: Secondary | ICD-10-CM | POA: Insufficient documentation

## 2014-08-26 DIAGNOSIS — I4891 Unspecified atrial fibrillation: Secondary | ICD-10-CM | POA: Insufficient documentation

## 2014-08-26 DIAGNOSIS — K219 Gastro-esophageal reflux disease without esophagitis: Secondary | ICD-10-CM | POA: Diagnosis not present

## 2014-08-26 DIAGNOSIS — Z7951 Long term (current) use of inhaled steroids: Secondary | ICD-10-CM | POA: Diagnosis not present

## 2014-08-26 DIAGNOSIS — J449 Chronic obstructive pulmonary disease, unspecified: Secondary | ICD-10-CM | POA: Diagnosis not present

## 2014-08-26 DIAGNOSIS — Z79899 Other long term (current) drug therapy: Secondary | ICD-10-CM | POA: Insufficient documentation

## 2014-08-26 DIAGNOSIS — I252 Old myocardial infarction: Secondary | ICD-10-CM | POA: Diagnosis not present

## 2014-08-26 DIAGNOSIS — Z9359 Other cystostomy status: Secondary | ICD-10-CM

## 2014-08-26 DIAGNOSIS — Z466 Encounter for fitting and adjustment of urinary device: Secondary | ICD-10-CM | POA: Insufficient documentation

## 2014-08-26 DIAGNOSIS — R339 Retention of urine, unspecified: Secondary | ICD-10-CM | POA: Insufficient documentation

## 2014-08-26 DIAGNOSIS — Z7982 Long term (current) use of aspirin: Secondary | ICD-10-CM | POA: Insufficient documentation

## 2014-08-26 DIAGNOSIS — R300 Dysuria: Secondary | ICD-10-CM | POA: Diagnosis present

## 2014-08-26 LAB — CBC WITH DIFFERENTIAL/PLATELET
BASOS PCT: 0 % (ref 0–1)
Basophils Absolute: 0 10*3/uL (ref 0.0–0.1)
EOS PCT: 3 % (ref 0–5)
Eosinophils Absolute: 0.3 10*3/uL (ref 0.0–0.7)
HCT: 27.1 % — ABNORMAL LOW (ref 39.0–52.0)
Hemoglobin: 7.7 g/dL — ABNORMAL LOW (ref 13.0–17.0)
LYMPHS ABS: 2.3 10*3/uL (ref 0.7–4.0)
Lymphocytes Relative: 21 % (ref 12–46)
MCH: 21.2 pg — AB (ref 26.0–34.0)
MCHC: 28.4 g/dL — ABNORMAL LOW (ref 30.0–36.0)
MCV: 74.5 fL — ABNORMAL LOW (ref 78.0–100.0)
MONO ABS: 0.9 10*3/uL (ref 0.1–1.0)
Monocytes Relative: 8 % (ref 3–12)
NEUTROS ABS: 7.5 10*3/uL (ref 1.7–7.7)
NEUTROS PCT: 68 % (ref 43–77)
PLATELETS: 297 10*3/uL (ref 150–400)
RBC: 3.64 MIL/uL — AB (ref 4.22–5.81)
RDW: 21.4 % — ABNORMAL HIGH (ref 11.5–15.5)
WBC: 11 10*3/uL — ABNORMAL HIGH (ref 4.0–10.5)

## 2014-08-26 LAB — COMPREHENSIVE METABOLIC PANEL
ALT: 10 U/L — AB (ref 17–63)
ANION GAP: 6 (ref 5–15)
AST: 18 U/L (ref 15–41)
Albumin: 2.9 g/dL — ABNORMAL LOW (ref 3.5–5.0)
Alkaline Phosphatase: 90 U/L (ref 38–126)
BILIRUBIN TOTAL: 0.3 mg/dL (ref 0.3–1.2)
BUN: 19 mg/dL (ref 6–20)
CALCIUM: 9.2 mg/dL (ref 8.9–10.3)
CO2: 39 mmol/L — AB (ref 22–32)
Chloride: 96 mmol/L — ABNORMAL LOW (ref 101–111)
Creatinine, Ser: 1.21 mg/dL (ref 0.61–1.24)
GFR calc Af Amer: >60 mL/min (ref >60)
GFR, EST NON AFRICAN AMERICAN: 60 mL/min — AB (ref >60)
Glucose, Bld: 124 mg/dL — ABNORMAL HIGH (ref 65–99)
POTASSIUM: 3.6 mmol/L (ref 3.5–5.1)
Sodium: 141 mmol/L (ref 135–145)
Total Protein: 8.2 g/dL — ABNORMAL HIGH (ref 6.5–8.1)

## 2014-08-26 LAB — URINALYSIS, ROUTINE W REFLEX MICROSCOPIC
BILIRUBIN URINE: NEGATIVE
GLUCOSE, UA: NEGATIVE mg/dL
Ketones, ur: NEGATIVE mg/dL
NITRITE: POSITIVE — AB
PH: 6 (ref 5.0–8.0)
PROTEIN: NEGATIVE mg/dL
Specific Gravity, Urine: 1.011 (ref 1.005–1.030)
Urobilinogen, UA: 0.2 mg/dL (ref 0.0–1.0)

## 2014-08-26 LAB — URINE MICROSCOPIC-ADD ON

## 2014-08-26 MED ORDER — MIDAZOLAM HCL 2 MG/2ML IJ SOLN
INTRAMUSCULAR | Status: DC
Start: 2014-08-26 — End: 2014-08-27
  Filled 2014-08-26: qty 2

## 2014-08-26 MED ORDER — IOHEXOL 300 MG/ML  SOLN
10.0000 mL | Freq: Once | INTRAMUSCULAR | Status: AC | PRN
  Administered 2014-08-26: 10 mL

## 2014-08-26 MED ORDER — CEPHALEXIN 500 MG PO CAPS: 500.0000 mg | ORAL_CAPSULE | Freq: Four times a day (QID) | ORAL | Status: DC

## 2014-08-26 MED ORDER — CIPROFLOXACIN IN D5W 400 MG/200ML IV SOLN: 400.0000 mg | Freq: Once | INTRAVENOUS | Status: DC

## 2014-08-26 MED ORDER — CIPROFLOXACIN IN D5W 400 MG/200ML IV SOLN
INTRAVENOUS | Status: AC
  Administered 2014-08-26: 400 mg via INTRAVENOUS
  Filled 2014-08-26: qty 200

## 2014-08-26 MED ORDER — MIDAZOLAM HCL 2 MG/2ML IJ SOLN
INTRAMUSCULAR | Status: DC | PRN
  Administered 2014-08-26: 1 mg via INTRAVENOUS

## 2014-08-26 MED ORDER — FENTANYL CITRATE (PF) 100 MCG/2ML IJ SOLN
INTRAMUSCULAR | Status: AC
  Filled 2014-08-26: qty 2

## 2014-08-26 MED ORDER — SODIUM CHLORIDE 0.9 % IV SOLN
INTRAVENOUS | Status: DC | PRN
  Administered 2014-08-26: 50 mL/h via INTRAVENOUS

## 2014-08-26 MED ORDER — FENTANYL CITRATE (PF) 100 MCG/2ML IJ SOLN
INTRAMUSCULAR | Status: DC | PRN
  Administered 2014-08-26: 50 ug via INTRAVENOUS

## 2014-08-26 MED ORDER — LIDOCAINE HCL 1 % IJ SOLN
INTRAMUSCULAR | Status: DC
Start: 2014-08-26 — End: 2014-08-27
  Filled 2014-08-26: qty 20

## 2014-08-26 NOTE — ED Notes (Signed)
Urology cart at bedside, awaiting urologist

## 2014-08-26 NOTE — ED Notes (Signed)
Per ems- [facility] and difficulty urinating since last night.  Pt also hypertensive.

## 2014-08-26 NOTE — H&P (Signed)
Chief Complaint  Patient presents with  . Dysuria  . Hypertension  Urinary retention secondary to urethral stricture  Referring Physician(s): [name]  History of Present Illness: Craig [name] is a 69 y.o. male with acute urinary retention secondary to a severe urethral stricture.  Dr. Laverle Patter unable to dilate stricture to place urethral catheter via cystoscopy.  Asked to emergently place suprapubic urinary bladder catheter.  Bladder scan shows bladder volume of approximately 500 mL.  Past Medical History  Diagnosis Date  . COPD (chronic obstructive pulmonary disease)   . Hypertension   . Myocardial infarction   . A-fib   . GERD (gastroesophageal reflux disease)   . Hepatitis B 04/27/2013  . Dysphagia 04/27/2013  . Diverticulosis 03/05/2013  . Sepsis 04/26/2013    Past Surgical History  Procedure Laterality Date  . Colostomy    . Peg tube placement    . Peg tube removal      Allergies: Review of patient's allergies indicates no known allergies.  Medications: Prior to Admission medications   Medication Sig Start Date End Date Taking? Authorizing Provider  amiodarone (PACERONE) 200 MG tablet Take 200 mg by mouth daily.   Yes Historical Provider, MD  aspirin EC 81 MG tablet Take 81 mg by mouth daily.   Yes Historical Provider, MD  cetirizine (ZYRTEC) 10 MG tablet Take 10 mg by mouth daily.   Yes Historical Provider, MD  escitalopram (LEXAPRO) 10 MG tablet Take 10 mg by mouth daily.   Yes Historical Provider, MD  Fluticasone Furoate-Vilanterol (BREO ELLIPTA) 100-25 MCG/INH AEPB Inhale 1 puff into the lungs daily.    Yes Historical Provider, MD  folic acid (FOLVITE) 1 MG tablet Take 1 mg by mouth daily.   Yes Historical Provider, MD  hydrOXYzine (ATARAX/VISTARIL) 25 MG tablet Take 25 mg by mouth every 6 (six) hours as needed for itching.   Yes Historical Provider, MD  K Phos Mono-Sod Phos Di & Mono (PHOSPHA 250 NEUTRAL PO) Take 250 mg by mouth 2 (two) times daily.     Yes Historical Provider, MD  lansoprazole (PREVACID SOLUTAB) 30 MG disintegrating tablet Take 30 mg by mouth daily.   Yes Historical Provider, MD  levothyroxine (SYNTHROID, LEVOTHROID) 112 MCG tablet Take 112 mcg by mouth daily before breakfast.   Yes Historical Provider, MD  Melatonin 3 MG TABS Take 1 tablet by mouth at bedtime as needed (sleep).    Yes Historical Provider, MD  morphine 20 MG/5ML solution Take 5 mg by mouth every 6 (six) hours as needed for pain.    Yes Historical Provider, MD  Multiple Vitamins-Minerals (DECUBI-VITE PO) Take 1 capsule by mouth daily.   Yes Historical Provider, MD  promethazine (PHENERGAN) 25 MG tablet Take 25 mg by mouth every 6 (six) hours as needed for nausea or vomiting.   Yes Historical Provider, MD  thiamine 100 MG tablet Take 100 mg by mouth daily.   Yes Historical Provider, MD     History reviewed. No pertinent family history.  Social History   Social History  . Marital Status: Unknown    Spouse Name: N/A  . Number of Children: N/A  . Years of Education: N/A   Social History Main Topics  . Smoking status: Former Games developer  . Smokeless tobacco: None  . Alcohol Use: No  . Drug Use: No  . Sexual Activity: Not Currently   Other Topics Concern  . None   Social History Narrative    Review of  Systems: A 12 point ROS discussed and pertinent positives are indicated in the HPI above.  All other systems are negative.  Review of Systems  Vital Signs: BP 179/56 mmHg  Pulse 84  Temp(Src) 98.1 F (36.7 C) (Oral)  SpO2 90%  Physical Exam  Mallampati Score:  MD Evaluation Airway: WNL Heart: WNL Abdomen: WNL Chest/ Lungs: WNL ASA  Classification: 2 Mallampati/Airway Score: Two  Imaging: No results found.  Labs:  CBC:  Recent Labs  08/26/14 1639  WBC 11.0*  HGB 7.7*  HCT 27.1*  PLT 297    COAGS: No results for input(s): INR, APTT in the last 8760 hours.  BMP:  Recent Labs  08/26/14 1639  NA 141  K 3.6  CL 96*    CO2 39*  GLUCOSE 124*  BUN 19  CALCIUM 9.2  CREATININE 1.21  GFRNONAA 60*  GFRAA >60    LIVER FUNCTION TESTS:  Recent Labs  08/26/14 1639  BILITOT 0.3  AST 18  ALT 10*  ALKPHOS 90  PROT 8.2*  ALBUMIN 2.9*    TUMOR MARKERS: No results for input(s): AFPTM, CEA, CA199, CHROMGRNA in the last 8760 hours.  Assessment and Plan:  Emergent suprapubic catheter placement tonight in IR.  Cipro 400 mg IV on call.   Risks and Benefits discussed with the patient including bleeding, infection, damage to adjacent structures, bowel perforation/fistula connection, and sepsis. All of the patient's questions were answered, patient is agreeable to proceed. Consent signed and in chart.   SignedIrish Lack T 08/26/2014, 10:10 PM

## 2014-08-26 NOTE — ED Provider Notes (Signed)
  Face-to-face evaluation   History: He complains of decreased urinary output today. He feels discomfort when he attempts to urinate. He is inactive, and essentially confined to bed or chair.  Physical exam: Alert and appears older than stated age. Abdomen is distended. Abdomen is soft. Normal external male genitalia.   Procedure: 18:30 - attempt by me at coud catheterization because nursing was unable to place the catheter. Initial attempt with 8 French coud catheter was unsuccessful. We'll attempt 14-gauge. Second attempt with smaller coud catheter also failed. We'll contact urology for placement of catheter.  Medical screening examination/treatment/procedure(s) were conducted as a shared visit with non-physician practitioner(s) and myself.  I personally evaluated the patient during the encounter  Mancel Bale, MD 08/27/14 2316

## 2014-08-26 NOTE — ED Provider Notes (Signed)
CSN: [id number]     Arrival date & time 08/26/14  1604 History   First MD Initiated Contact with Patient 08/26/14 1608     Chief Complaint  Patient presents with  . Dysuria  . Hypertension     (Consider location/radiation/quality/duration/timing/severity/associated sxs/prior Treatment) HPI Craig [name] is a 69 y.o. male history of COPD, hypertension, colostomy, comes in for evaluation of urinary retention and high blood pressure. Patient states he has been unable to urinate since 11:00 this morning and feels like he needs to urinate. Reports associated dysuria. He denies any fevers, abdominal pain, abdominal distention, nausea or vomiting, diarrhea or constipation. No cough, chest pain, shortness of breath. Nothing tried to improve symptoms. Nothing makes symptoms better or worse. No other aggravating or modifying factors. Patient reports that he does not walk and is bedbound due to prolonged hospitalization for blood infection.  Past Medical History  Diagnosis Date  . COPD (chronic obstructive pulmonary disease)   . Hypertension   . Myocardial infarction   . A-fib   . GERD (gastroesophageal reflux disease)   . Hepatitis B 04/27/2013  . Dysphagia 04/27/2013  . Diverticulosis 03/05/2013  . Sepsis 04/26/2013   Past Surgical History  Procedure Laterality Date  . Colostomy    . Peg tube placement    . Peg tube removal     History reviewed. No pertinent family history. Social History  Substance Use Topics  . Smoking status: Former Games developer  . Smokeless tobacco: None  . Alcohol Use: No    Review of Systems A 10 point review of systems was completed and was negative except for pertinent positives and negatives as mentioned in the history of present illness     Allergies  Review of patient's allergies indicates no known allergies.  Home Medications   Prior to Admission medications   Medication Sig Start Date End Date Taking? Authorizing Provider  amiodarone (PACERONE) 200 MG  tablet Take 200 mg by mouth daily.   Yes Historical Provider, MD  aspirin EC 81 MG tablet Take 81 mg by mouth daily.   Yes Historical Provider, MD  cetirizine (ZYRTEC) 10 MG tablet Take 10 mg by mouth daily.   Yes Historical Provider, MD  escitalopram (LEXAPRO) 10 MG tablet Take 10 mg by mouth daily.   Yes Historical Provider, MD  Fluticasone Furoate-Vilanterol (BREO ELLIPTA) 100-25 MCG/INH AEPB Inhale 1 puff into the lungs daily.    Yes Historical Provider, MD  folic acid (FOLVITE) 1 MG tablet Take 1 mg by mouth daily.   Yes Historical Provider, MD  hydrOXYzine (ATARAX/VISTARIL) 25 MG tablet Take 25 mg by mouth every 6 (six) hours as needed for itching.   Yes Historical Provider, MD  K Phos Mono-Sod Phos Di & Mono (PHOSPHA 250 NEUTRAL PO) Take 250 mg by mouth 2 (two) times daily.    Yes Historical Provider, MD  lansoprazole (PREVACID SOLUTAB) 30 MG disintegrating tablet Take 30 mg by mouth daily.   Yes Historical Provider, MD  levothyroxine (SYNTHROID, LEVOTHROID) 112 MCG tablet Take 112 mcg by mouth daily before breakfast.   Yes Historical Provider, MD  Melatonin 3 MG TABS Take 1 tablet by mouth at bedtime as needed (sleep).    Yes Historical Provider, MD  morphine 20 MG/5ML solution Take 5 mg by mouth every 6 (six) hours as needed for pain.    Yes Historical Provider, MD  Multiple Vitamins-Minerals (DECUBI-VITE PO) Take 1 capsule by mouth daily.   Yes Historical Provider, MD  promethazine (PHENERGAN) 25  MG tablet Take 25 mg by mouth every 6 (six) hours as needed for nausea or vomiting.   Yes Historical Provider, MD  thiamine 100 MG tablet Take 100 mg by mouth daily.   Yes Historical Provider, MD  cephALEXin (KEFLEX) 500 MG capsule Take 1 capsule (500 mg total) by mouth 4 (four) times daily. 08/26/14   Obie Silos, PA-C   BP 153/68 mmHg  Pulse 72  Temp(Src) 97 F (36.1 C) (Oral)  Resp 16  SpO2 96% Physical Exam  Constitutional: He is oriented to person, place, and time. He appears  well-developed and well-nourished.  HENT:  Head: Normocephalic and atraumatic.  Mouth/Throat: Oropharynx is clear and moist.  Eyes: Conjunctivae are normal. Pupils are equal, round, and reactive to light. Right eye exhibits no discharge. Left eye exhibits no discharge. No scleral icterus.  Neck: Neck supple.  Cardiovascular: Normal rate, regular rhythm and normal heart sounds.   Pulmonary/Chest: Effort normal and breath sounds normal. No respiratory distress. He has no wheezes. He has no rales.  Abdominal: Soft. There is no tenderness.  Left-sided colostomy. Brown stool in bag. Stoma appears to have pink viable tissue with no evidence of surrounding cellulitis or other infection. Abdomen without any tenderness. Mild distention.  Musculoskeletal: He exhibits no tenderness.  Significant atrophy to bilateral lower extremities.  Neurological: He is alert and oriented to person, place, and time.  Cranial Nerves II-XII grossly intact. Moves extremities without ataxia. Motor intact, sensation intact to light touch.  Skin: Skin is warm and dry. No rash noted.  Psychiatric: He has a normal mood and affect.  Nursing note and vitals reviewed.   ED Course  Procedures (including critical care time)   Labs Review Labs Reviewed  COMPREHENSIVE METABOLIC PANEL - Abnormal; Notable for the following:    Chloride 96 (*)    CO2 39 (*)    Glucose, Bld 124 (*)    Total Protein 8.2 (*)    Albumin 2.9 (*)    ALT 10 (*)    GFR calc non Af Amer 60 (*)    All other components within normal limits  CBC WITH DIFFERENTIAL/PLATELET - Abnormal; Notable for the following:    WBC 11.0 (*)    RBC 3.64 (*)    Hemoglobin 7.7 (*)    HCT 27.1 (*)    MCV 74.5 (*)    MCH 21.2 (*)    MCHC 28.4 (*)    RDW 21.4 (*)    All other components within normal limits  URINALYSIS, ROUTINE W REFLEX MICROSCOPIC (NOT AT [facility]) - Abnormal; Notable for the following:    APPearance TURBID (*)    Hgb urine dipstick LARGE (*)     Nitrite POSITIVE (*)    Leukocytes, UA LARGE (*)    All other components within normal limits  URINE MICROSCOPIC-ADD ON - Abnormal; Notable for the following:    Bacteria, UA MANY (*)    All other components within normal limits  URINE CULTURE  URINE CULTURE  URINALYSIS, ROUTINE W REFLEX MICROSCOPIC (NOT AT [facility])  URINALYSIS W MICROSCOPIC    Imaging Review No results found. I, Sharlene Motts, personally reviewed and evaluated these images and lab results as part of my medical decision-making.   EKG Interpretation None     Meds given in ED:  Medications  lidocaine (XYLOCAINE) 1 % (with pres) injection (not administered)  fentaNYL (SUBLIMAZE) 100 MCG/2ML injection (not administered)  midazolam (VERSED) 2 MG/2ML injection (not administered)  0.9 %  sodium  chloride infusion (50 mL/hr Intravenous New Bag/Given 08/26/14 2140)  midazolam (VERSED) injection (1 mg Intravenous Given 08/26/14 2140)  fentaNYL (SUBLIMAZE) injection (50 mcg Intravenous Given 08/26/14 2140)  ciprofloxacin (CIPRO) IVPB 400 mg (400 mg Intravenous New Bag/Given 08/26/14 2233)  iohexol (OMNIPAQUE) 300 MG/ML solution 10 mL (10 mLs Other Contrast Given 08/26/14 2233)    New Prescriptions   CEPHALEXIN (KEFLEX) 500 MG CAPSULE    Take 1 capsule (500 mg total) by mouth 4 (four) times daily.   Filed Vitals:   08/26/14 1610 08/26/14 2114 08/26/14 2138 08/26/14 2302  BP: 179/56  168/72 153/68  Pulse: 84  69 72  Temp: 98.3 F (36.8 C) 98.1 F (36.7 C) 97 F (36.1 C)   TempSrc: Oral Oral Oral   Resp:   18 16  SpO2: 90%  95% 96%    MDM  Vitals stable -afebrile Bladder scan performed by myself at bedside showed 535 mL. Will insert Foley catheter. Nursing unable to complete Foley, attempted coude also without success. Discussed my attending, Dr. Effie Shy who also attempted size 16 and 14 coud without success Consultation urology, spoke with Dr. Laverle Patter, will see patient in ED. Patient taken to IR for suprapubic  catheter placement. After successful placement, patient will need to follow-up with Dr. Laverle Patter on outpatient basis for further evaluation of urethral stricture. Evidence of UTI on urinalysis, will initiate antibiotics. Low concern for systemic infection or other acute or emergent pathology at this time. I discussed all relevant lab findings and imaging results with pt and they verbalized understanding. Discussed f/u with PCP within 48 hrs and return precautions, pt very amenable to plan.  Final diagnoses:  Urine retention  Suprapubic catheter        Joycie Peek, PA-C 08/27/14 1508  Mancel Bale, MD 08/27/14 1610  Mancel Bale, MD 08/27/14 2316

## 2014-08-26 NOTE — Procedures (Signed)
Interventional Radiology Procedure Note  Procedure:  Suprapubic bladder catheter placement with ultrasound and fluoro  Complications:  None  Estimated Blood Loss: <10 mL  12 Fr pigtail drain placed from midline suprapubic approach into distended bladder.  Attached to gravity bag. Urine turbid.  Sample sent for UA and culture.  Jodi Marble. Fredia Sorrow, M.D Pager:  [phone]

## 2014-08-26 NOTE — ED Notes (Signed)
Surgeon had OR nurse to send urine sample for UA and Culture,  Warm blankets placed on pt for comfort

## 2014-08-26 NOTE — ED Notes (Signed)
Bed: WA07 Expected date:  Expected time:  Means of arrival:  Comments: Patient in IR

## 2014-08-26 NOTE — ED Notes (Signed)
OR staff called and they are coming in to do a supra pubic catheter,  Terri RN charge aware

## 2014-08-26 NOTE — Consult Note (Signed)
Urology Consult   Physician requesting consult: Dr. Eulis Foster  Reason for consult: Urinary retention and difficult urethral catheterization  History of Present Illness: Craig [name] is a 69 y.o. patient who presented to the emergency department with an inability to urinate tonight.  He gives a 2 day history of difficulty urinating but states that he was urinating well and without difficulty prior to Thursday.  He has denied any fever or flank pain.  Currently he does have suprapubic pain and discomfort.  He is non-ambulatory which probably is related to loss of his lower extremity function from a prior episode of severe sepsis 2 years ago.  He also has a history of diverticulitis and has a left lower quadrant colostomy.  His other medical comorbidities include a history of COPD, hypertension, myocardial infarction, atrial fibrillation, hepatitis B, and gastroesophageal reflux disease.  He does reside at a skilled nursing facility.  He requires total care.  He denies prior episodes of urinary retention although is not a very good historian.  He states he might have had urinary retention about 30 years ago.  He currently is not anticoagulated for atrial fibrillation.  Multiple attempts by the medical staff in the emergency room were unsuccessful in placing a catheter with resistance met in the mid to proximal urethra. A bladder ultrasound has demonstrated over 500 cc.  He denies a history of voiding or storage urinary symptoms, hematuria, UTIs, STDs, urolithiasis, GU malignancy/trauma/surgery.  Past Medical History  Diagnosis Date  . COPD (chronic obstructive pulmonary disease)   . Hypertension   . Myocardial infarction   . A-fib   . GERD (gastroesophageal reflux disease)   . Hepatitis B 04/27/2013  . Dysphagia 04/27/2013  . Diverticulosis 03/05/2013  . Sepsis 04/26/2013    Past Surgical History  Procedure Laterality Date  . Colostomy    . Peg tube placement    . Peg tube removal       Medications:  Home meds:    Medication List    ASK your doctor about these medications        amiodarone 200 MG tablet  Commonly known as:  PACERONE  Take 200 mg by mouth daily.     aspirin EC 81 MG tablet  Take 81 mg by mouth daily.     BREO ELLIPTA 100-25 MCG/INH Aepb  Generic drug:  Fluticasone Furoate-Vilanterol  Inhale 1 puff into the lungs daily.     cetirizine 10 MG tablet  Commonly known as:  ZYRTEC  Take 10 mg by mouth daily.     DECUBI-VITE PO  Take 1 capsule by mouth daily.     escitalopram 10 MG tablet  Commonly known as:  LEXAPRO  Take 10 mg by mouth daily.     folic acid 1 MG tablet  Commonly known as:  FOLVITE  Take 1 mg by mouth daily.     hydrOXYzine 25 MG tablet  Commonly known as:  ATARAX/VISTARIL  Take 25 mg by mouth every 6 (six) hours as needed for itching.     lansoprazole 30 MG disintegrating tablet  Commonly known as:  PREVACID SOLUTAB  Take 30 mg by mouth daily.     levothyroxine 112 MCG tablet  Commonly known as:  SYNTHROID, LEVOTHROID  Take 112 mcg by mouth daily before breakfast.     Melatonin 3 MG Tabs  Take 1 tablet by mouth at bedtime as needed (sleep).     morphine 20 MG/5ML solution  Take 5 mg by mouth every 6 (  six) hours as needed for pain.     PHOSPHA 250 NEUTRAL PO  Take 250 mg by mouth 2 (two) times daily.     promethazine 25 MG tablet  Commonly known as:  PHENERGAN  Take 25 mg by mouth every 6 (six) hours as needed for nausea or vomiting.     thiamine 100 MG tablet  Take 100 mg by mouth daily.        Scheduled Meds: Continuous Infusions: PRN Meds:.  Allergies: No Known Allergies  History reviewed. No pertinent family history.  Social History:  reports that he has quit smoking. He does not have any smokeless tobacco history on file. He reports that he does not drink alcohol or use illicit drugs.  ROS: A complete review of systems was performed.  All systems are negative except for pertinent  findings as noted.  Physical Exam:  Vital signs in last 24 hours: Temp:  [98.3 F (36.8 C)] 98.3 F (36.8 C) (08/13 1610) Pulse Rate:  [84] 84 (08/13 1610) BP: (179)/(56) 179/56 mmHg (08/13 1610) SpO2:  [90 %] 90 % (08/13 1610) Constitutional:  Alert and oriented, No acute distress Cardiovascular: Regular rate and rhythm, No JVD Respiratory: Normal respiratory effort, Lungs clear bilaterally GI: Abdomen is soft, nontender, nondistended, no abdominal masses Genitourinary: No CVAT. Normal male phallus, testes are descended bilaterally and non-tender and without masses, scrotum is normal in appearance without lesions or masses, perineum is normal on inspection. Lymphatic: No lymphadenopathy Neurologic: Grossly intact, no focal deficits Psychiatric: Normal mood and affect  Laboratory Data:   Recent Labs  08/26/14 1639  WBC 11.0*  HGB 7.7*  HCT 27.1*  PLT 297     Recent Labs  08/26/14 1639  NA 141  K 3.6  CL 96*  GLUCOSE 124*  BUN 19  CALCIUM 9.2  CREATININE 1.21     Results for orders placed or performed during the hospital encounter of 08/26/14 (from the past 24 hour(s))  Comprehensive metabolic panel     Status: Abnormal   Collection Time: 08/26/14  4:39 PM  Result Value Ref Range   Sodium 141 135 - 145 mmol/L   Potassium 3.6 3.5 - 5.1 mmol/L   Chloride 96 (L) 101 - 111 mmol/L   CO2 39 (H) 22 - 32 mmol/L   Glucose, Bld 124 (H) 65 - 99 mg/dL   BUN 19 6 - 20 mg/dL   Creatinine, Ser 1.21 0.61 - 1.24 mg/dL   Calcium 9.2 8.9 - 10.3 mg/dL   Total Protein 8.2 (H) 6.5 - 8.1 g/dL   Albumin 2.9 (L) 3.5 - 5.0 g/dL   AST 18 15 - 41 U/L   ALT 10 (L) 17 - 63 U/L   Alkaline Phosphatase 90 38 - 126 U/L   Total Bilirubin 0.3 0.3 - 1.2 mg/dL   GFR calc non Af Amer 60 (L) >60 mL/min   GFR calc Af Amer >60 >60 mL/min   Anion gap 6 5 - 15  CBC with Differential     Status: Abnormal   Collection Time: 08/26/14  4:39 PM  Result Value Ref Range   WBC 11.0 (H) 4.0 - 10.5 K/uL    RBC 3.64 (L) 4.22 - 5.81 MIL/uL   Hemoglobin 7.7 (L) 13.0 - 17.0 g/dL   HCT 27.1 (L) 39.0 - 52.0 %   MCV 74.5 (L) 78.0 - 100.0 fL   MCH 21.2 (L) 26.0 - 34.0 pg   MCHC 28.4 (L) 30.0 - 36.0 g/dL  RDW 21.4 (H) 11.5 - 15.5 %   Platelets 297 150 - 400 K/uL   Neutrophils Relative % 68 43 - 77 %   Lymphocytes Relative 21 12 - 46 %   Monocytes Relative 8 3 - 12 %   Eosinophils Relative 3 0 - 5 %   Basophils Relative 0 0 - 1 %   Neutro Abs 7.5 1.7 - 7.7 K/uL   Lymphs Abs 2.3 0.7 - 4.0 K/uL   Monocytes Absolute 0.9 0.1 - 1.0 K/uL   Eosinophils Absolute 0.3 0.0 - 0.7 K/uL   Basophils Absolute 0.0 0.0 - 0.1 K/uL   RBC Morphology TARGET CELLS    No results found for this or any previous visit (from the past 240 hour(s)).  Renal Function:  Recent Labs  08/26/14 1639  CREATININE 1.21   CrCl cannot be calculated (Unknown ideal weight.).  Procedure:  I prepped his genitalia with Betadine.  I then attempted a gentle pass a 60 [place] coud catheter and met significant resistance within the pendulous urethra approximately 6 cm from the urethral meatus.  I then attempted gentle placement of a 12 French Foley catheter again with resistance in the same place.  At this point, I decided to proceed with flexible cystoscopy for further endoscopic evaluation.  Utilizing the 63 French flexible cystoscope, I did encounter a dense urethral stricture in the pendulous urethra consistent with the area of resistance previously noted.  I could identify a small opening and placed a 0.38 sensor guidewire through this opening.  However, this met assistance approximately 2-3 cm beyond the stricture with the wire curling back on me.  I again made multiple attempts trying to pass the wire which were unsuccessful.  It was unclear whether he has further stricture disease beyond the visible stricture.  I therefore did not proceed with dilation for this reason and felt that a suprapubic tube would be his best option.   However, considering his prior lower abdominal incision and colostomy, there would be significant concern about bowel injury placing a suprapubic tube at the bedside.  Impression/Recommendation  1.  Urinary retention/urethral stricture: I was unable to place a catheter via the urethra and feel that it would be dangerous to proceed with further attempts transurethrally. I also did not feel that it would be safe to proceed with suprapubic tube placement at the bedside considering his prior lower abdominal surgery.  I did discuss his situation with Dr. Aletta Edouard in interventional radiology About percutaneous suprapubic tube placement under image guidance.  The only got agrees to evaluate Craig [name] and will likely proceed with image guided suprapubic tube placement later this evening. Craig [name] can then likely return to his nursing facility if Dr. Kathlene Cote does not have further concerns after this procedure.  If there are concerns about the possibility of infection, etc., he may require hospital admission by [facility].   He'll then require outpatient follow-up with me to further address his urethral stricture disease with further imaging to determine the most appropriate long-term management.  Denelle Capurro,LES 08/26/2014, 8:41 PM    Pryor Curia MD  CC: Dr. Eulis Foster

## 2014-08-26 NOTE — ED Notes (Signed)
Attempted to insert straight tip foley with no success, catheter coiled.Clayborne Dana, Charge RN attempted foley insert with 89f coude, insertion unsuccessful.  Romeo Apple, [place] made aware.

## 2014-08-26 NOTE — ED Notes (Signed)
Bed: WA07 Expected date: 08/26/14 Expected time: 4:06 PM Means of arrival: Ambulance Comments: Hypertensive

## 2014-08-26 NOTE — Discharge Instructions (Signed)
Is important for you to follow-up with Dr. Laverle Patter, urology for further evaluation and management of your symptoms. Please take your antibiotics as prescribed. Do not save or share your antibiotics. Return to ED for new or worsening symptoms.  Acute Urinary Retention Acute urinary retention is the temporary inability to urinate. This is a common problem in older men. As men age their prostates become larger and block the flow of urine from the bladder. This is usually a problem that has come on gradually.  HOME CARE INSTRUCTIONS If you are sent home with a Foley catheter and a drainage system, you will need to discuss the best course of action with your health care provider. While the catheter is in, maintain a good intake of fluids. Keep the drainage bag emptied and lower than your catheter. This is so that contaminated urine will not flow back into your bladder, which could lead to a urinary tract infection. There are two main types of drainage bags. One is a large bag that usually is used at night. It has a good capacity that will allow you to sleep through the night without having to empty it. The second type is called a leg bag. It has a smaller capacity, so it needs to be emptied more frequently. However, the main advantage is that it can be attached by a leg strap and can go underneath your clothing, allowing you the freedom to move about or leave your home. Only take over-the-counter or prescription medicines for pain, discomfort, or fever as directed by your health care provider.  SEEK MEDICAL CARE IF:  You develop a low-grade fever.  You experience spasms or leakage of urine with the spasms. SEEK IMMEDIATE MEDICAL CARE IF:   You develop chills or fever.  Your catheter stops draining urine.  Your catheter falls out.  You start to develop increased bleeding that does not respond to rest and increased fluid intake. MAKE SURE YOU:  Understand these instructions.  Will watch your  condition.  Will get help right away if you are not doing well or get worse. Document Released: 04/07/2000 Document Revised: 01/04/2013 Document Reviewed: 06/10/2012 [facility] Patient Information 2015 [place], [place]. This information is not intended to replace advice given to you by your health care provider. Make sure you discuss any questions you have with your health care provider.  Suprapubic Catheter Home Guide A suprapubic catheter is a rubber tube with a tiny balloon on the end. It is used to drain urine from the bladder. This catheter is put in your bladder through a small opening in the lower center part of your abdomen. Suprapubic refers to the area right above your pubic bone. The balloon on the end of the catheter is filled with germ-free (sterile) water. This keeps the catheter from slipping out. When the catheter is in place, your urine will drain into a collection bag. The bag can be put beside your bed at night or attached to your leg during the day. HOW TO CARE FOR YOUR CATHETER  Cleaning your skin  Clean the skin around the catheter opening every day.  Wash your hands with soap and water.  Clean the skin around the opening with a clean washcloth and soapy water. Do not pull on the tube.  Pat the area dry with a clean towel.  Your caregiver may want you to put a bandage (dressing) over the site. Do not use ointment on this area unless your caregiver tells you to. Cleaning the catheter  Ask your caregiver if you need to clean the catheter and how often.  Use only soap and water.  There may be crusts on the catheter. Put hydrogen peroxide on a cotton ball or gauze pad to remove any crust. Emptying the collection bag  You may have a large drainage bag to use at night and a smaller one for daytime. Empty the large bag every 8 hours. Empty the small bag when it is about  full.  Keep the drainage bag below the level of the catheter. This keeps urine from flowing  backwards.  Hold the bag over the toilet or another container. Release the valve (spigot) at the bottom of the bag. Do not touch the opening of the spigot. Do not let the opening touch the toilet or container.  Close the spigot tightly when the bag is empty. Cleaning the collection bag  Clean the bag every few days.  First, wash your hands.  Disconnect the tubing from the catheter. Replace the used bag with a new bag. Then you can clean the used one.  Empty the used bag completely. Rinse it out with warm water and soap or fill the bag with water and add 1 teaspoon of vinegar. Let it sit for about 30 minutes. Then drain.  The bag should be completely dry before storing it. Put it inside a plastic bag to keep it clean. Checking everything  Always make sure there are no kinks in the catheter or tubing.  Always make sure there are no leaks in the catheter, tubing, or collection bag. HOW TO CHANGE YOUR CATHETER Sometimes, a caregiver will change your suprapubic catheter. Other times, you may need to change it yourself. This may be the case if you need to wear a catheter for a long time. Usually, they need to be changed every 4 to 6 weeks. Ask your caregiver how often yours should be changed. Your caregiver will help you order the following supplies for home delivery:  Sterile gloves.  Catheters.  Syringes.  Sterile water.  Sterile cleaning solution.  Lubricant.  Drainage bags. Changing your catheter  Drink plenty of fluids before changing the catheter.  Wash your hands with soap and water.  Lie on your back and put on sterile gloves.  Clean the skin around the catheter opening. Use the sterile cleaning solution.  Use a syringe to get the water out of the balloon from the old catheter.  Slowly remove the catheter.  Take off the first pair of gloves, and put on a new pair. Then put lubricant on the tip of the new catheter. Put the new catheter through the opening.  Wait  for some urine to start flowing. Then, use the other syringe to fill the balloon with sterile water.  Attach the catheter to your drainage bag. Make sure the connection is tight. Important warnings  The catheter should come out easily. If it seems stuck, do not pull it.  Call your caregiver right away if you have any trouble while changing the catheter.  When the old catheter is removed, the new one should be put in right away. This is because the opening will close quickly. If you have a problem, go to an emergency clinic right away. RISKS AND COMPLICATIONS  Urine flow can become blocked. This can happen if the catheter or tubes are not working right. A blood clot can also block urine flow.  The catheter might irritate tissue in your body. This can cause bleeding.  The  skin near the opening for the catheter may become irritated or infected.  Bacteria may get into your bladder. This can cause a urinary tract infection. HOME CARE INSTRUCTIONS  Take all medicines prescribed by your caregiver. Follow the directions carefully.  Drink 8 glasses of water every day. This produces good urine flow.  Check the skin around your catheter a few times every day. Watch for redness and swelling. Look for any fluids coming out of the opening.  Do not use powder or cream around the catheter opening.  Do not take tub baths or use pools or hot tubs.  Keep all follow-up appointments. SEEK MEDICAL CARE IF:  You leak urine.  Your skin around the catheter becomes red or sore.  Your urine flow slows down.  Your urine gets cloudy or smelly. SEEK IMMEDIATE MEDICAL CARE IF:   You have chills, nausea, or back pain.  You have trouble changing your catheter.  Your catheter comes out.  You have blood in your urine.  You have no urine flow for 1 hour.  You have a fever. Document Released: 09/17/2010 Document Revised: 03/24/2011 Document Reviewed: 09/17/2010 [facility] Patient Information 2015  [place], [place]. This information is not intended to replace advice given to you by your health care provider. Make sure you discuss any questions you have with your health care provider.

## 2014-08-27 DIAGNOSIS — R339 Retention of urine, unspecified: Secondary | ICD-10-CM | POA: Diagnosis not present

## 2014-08-27 IMAGING — XA IR REPLACE G/J TUBE W/ FLUORO
1 series · 2 of 2 positions shown · non-contrast
Comparison: none

CLINICAL DATA: Dysfunctional gastrojejunostomy tube.

[Series 300: sp replc gastro/jejuno tube percut w/flu · 2 of 2 slices shown]
[im 1/2]
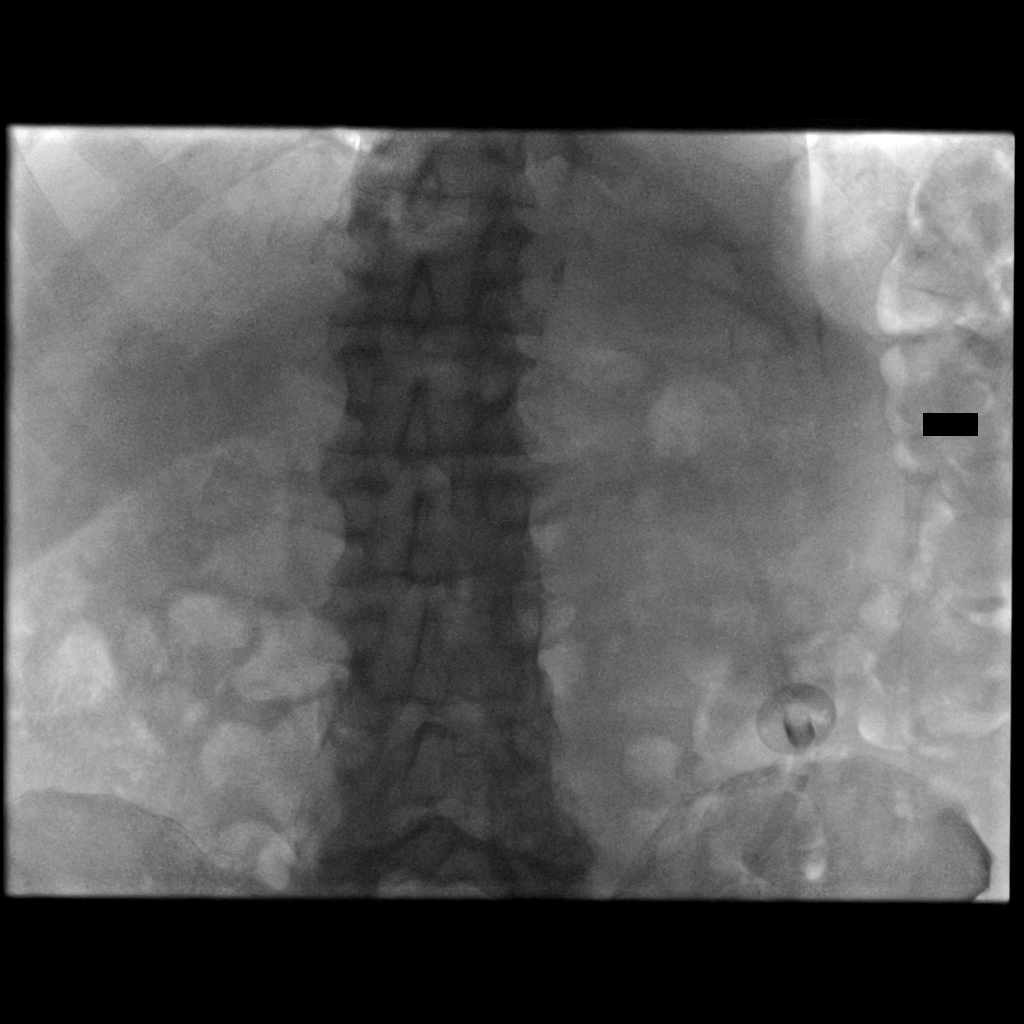
[im 2/2]
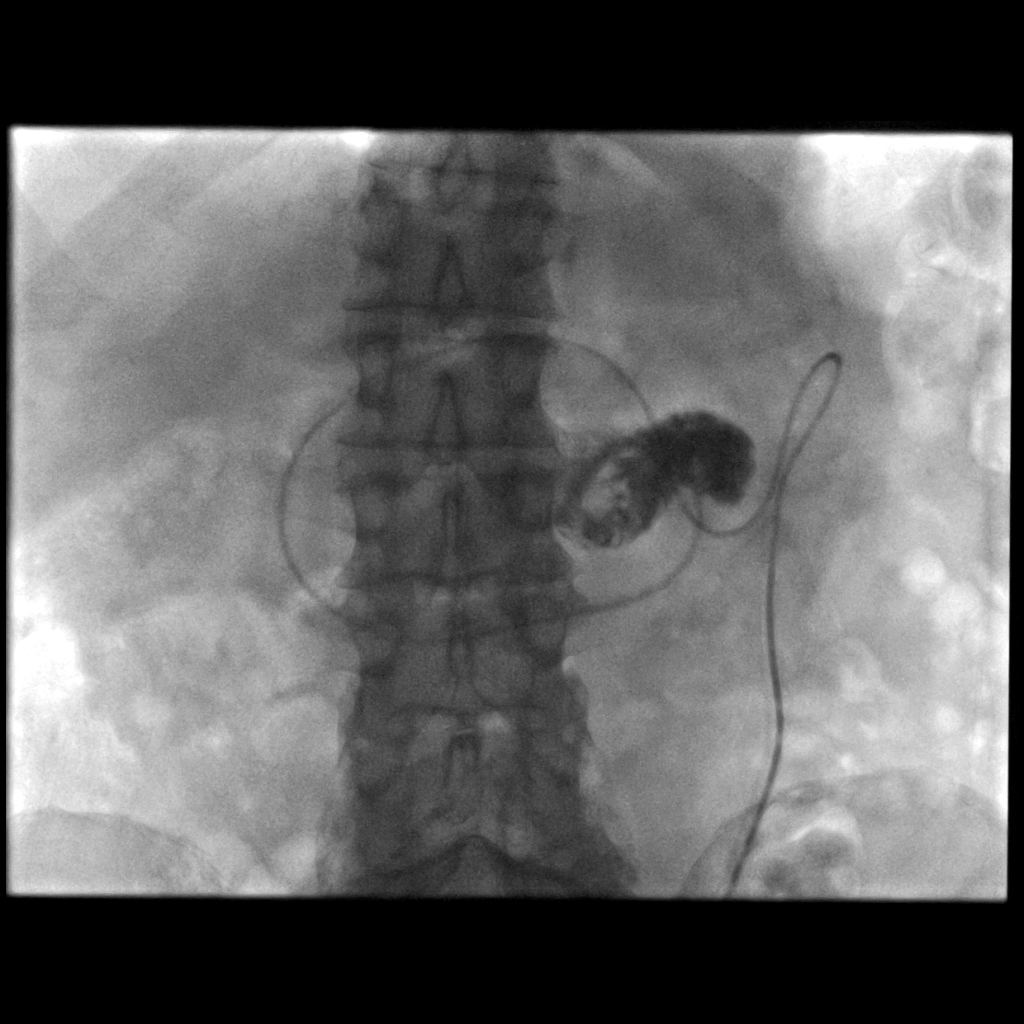

[2 of 2 positions shown; findings below may reference images not displayed]

EXAM:
JEJUNAL CATHETER REPLACEMENT

FLUOROSCOPY TIME:  48 seconds.

MEDICATIONS AND MEDICAL HISTORY:
None

ANESTHESIA/SEDATION:
None

CONTRAST:  10 cc Omnipaque 300

PROCEDURE:
The procedure, risks, benefits, and alternatives were explained to
the patient. Questions regarding the procedure were encouraged and
answered. The patient understands and consents to the procedure.

The abdomen was prepped with Betadine in a sterile fashion, and a
sterile drape was applied covering the operative field. A sterile
gown and sterile gloves were used for the procedure.

The existing gastrojejunostomy tube was exchanged over a stiff
glidewire for a new 18 French gastrojejunostomy tube. The tip was
positioned and the jejunum. 10 cc saline views utilized to
insufflate the balloon.
FINDINGS: Image demonstrates exchange of the gastrojejunostomy tube with its
tip in the jejunum. Contrast fills the jejunum.

COMPLICATIONS:
None
IMPRESSION: Successful gastrojejunostomy tube exchange.

## 2014-08-27 NOTE — ED Notes (Signed)
Pt is going to [facility] not [facility] report called to [place] at home

## 2014-08-28 ENCOUNTER — Encounter: Payer: Self-pay | Admitting: Adult Health

## 2014-08-28 ENCOUNTER — Non-Acute Institutional Stay (SKILLED_NURSING_FACILITY): Payer: Medicare Other | Admitting: Adult Health

## 2014-08-28 DIAGNOSIS — J449 Chronic obstructive pulmonary disease, unspecified: Secondary | ICD-10-CM | POA: Diagnosis not present

## 2014-08-28 DIAGNOSIS — N390 Urinary tract infection, site not specified: Secondary | ICD-10-CM | POA: Diagnosis not present

## 2014-08-28 DIAGNOSIS — E039 Hypothyroidism, unspecified: Secondary | ICD-10-CM

## 2014-08-28 DIAGNOSIS — R339 Retention of urine, unspecified: Secondary | ICD-10-CM

## 2014-08-28 DIAGNOSIS — G8929 Other chronic pain: Secondary | ICD-10-CM | POA: Diagnosis not present

## 2014-08-28 DIAGNOSIS — I482 Chronic atrial fibrillation, unspecified: Secondary | ICD-10-CM

## 2014-08-28 DIAGNOSIS — K219 Gastro-esophageal reflux disease without esophagitis: Secondary | ICD-10-CM | POA: Diagnosis not present

## 2014-08-28 DIAGNOSIS — E876 Hypokalemia: Secondary | ICD-10-CM

## 2014-08-28 NOTE — Progress Notes (Signed)
Patient ID: Craig [name], male   DOB: 1945/10/23, 69 y.o.   MRN: [id number]    Facility: [facility]      No Known Allergies  Chief Complaint  Patient presents with  . Medical Management of Chronic Issues  . Acute Visit    follow up ED visit     HPI:  He is a long term resident of this facility being seen for the management of his chronic illnesses. He went to the ED over this past weekend due to an inability to void. In the ED they were unable to cath him and placed a s/p foley. He will be due to follow up with urology. He states that he is feeling better.     Past Medical History  Diagnosis Date  . COPD (chronic obstructive pulmonary disease)   . Hypertension   . Myocardial infarction   . A-fib   . GERD (gastroesophageal reflux disease)   . Hepatitis B 04/27/2013  . Dysphagia 04/27/2013  . Diverticulosis 03/05/2013  . Sepsis 04/26/2013    Past Surgical History  Procedure Laterality Date  . Colostomy    . Peg tube placement    . Peg tube removal      VITAL SIGNS BP 130/72 mmHg  Pulse 86  Ht 5\' 10"  (1.778 m)  Wt 179 lb (81.194 kg)  BMI 25.68 kg/m2  SpO2 93%  Patient's Medications  New Prescriptions   No medications on file  Previous Medications   AMIODARONE (PACERONE) 200 MG TABLET    Take 200 mg by mouth daily.   ASPIRIN EC 81 MG TABLET    Take 81 mg by mouth daily.   CEPHALEXIN (KEFLEX) 500 MG CAPSULE    Take 1 capsule (500 mg total) by mouth 4 (four) times daily.   CETIRIZINE (ZYRTEC) 10 MG TABLET    Take 10 mg by mouth daily.   ESCITALOPRAM (LEXAPRO) 10 MG TABLET    Take 10 mg by mouth daily.   FLUTICASONE FUROATE-VILANTEROL (BREO ELLIPTA) 100-25 MCG/INH AEPB    Inhale 1 puff into the lungs daily.    FOLIC ACID (FOLVITE) 1 MG TABLET    Take 1 mg by mouth daily.   HYDROXYZINE (ATARAX/VISTARIL) 25 MG TABLET    Take 25 mg by mouth every 6 (six) hours as needed for itching.   K PHOS MONO-SOD PHOS DI & MONO (PHOSPHA 250 NEUTRAL PO)    Take 250  mg by mouth 2 (two) times daily.    LANSOPRAZOLE (PREVACID SOLUTAB) 30 MG DISINTEGRATING TABLET    Take 30 mg by mouth daily.   LEVOTHYROXINE (SYNTHROID, LEVOTHROID) 112 MCG TABLET    Take 112 mcg by mouth daily before breakfast.   MELATONIN 3 MG TABS    Take 1 tablet by mouth at bedtime as needed (sleep).    MORPHINE 20 MG/5ML SOLUTION    Take 5 mg by mouth every 6 (six) hours as needed for pain.    MULTIPLE VITAMINS-MINERALS (DECUBI-VITE PO)    Take 1 capsule by mouth daily.   PROMETHAZINE (PHENERGAN) 25 MG TABLET    Take 25 mg by mouth every 6 (six) hours as needed for nausea or vomiting.   THIAMINE 100 MG TABLET    Take 100 mg by mouth daily.  Modified Medications   No medications on file  Discontinued Medications   No medications on file     SIGNIFICANT DIAGNOSTIC EXAMS  10-31-13: kub: no acute abnormality  12-16-13: chest x-ray; right lower lobe atelectasis  08-26-14: s/p foley placement: Placement of 12 French suprapubic bladder drainage catheter. Urine return was cloudy and a sample of urine was sent for urinalysis and culture.    LABS REVIEWED:   12-19-13: wbc 12.7; hg 8.1; hct 28.3; mcv 65.2; plt 347;glucose 70; bun 18; creat 1.52; k+5.5; na++136; liver normal albumin 2.8; phos 4.0; mag 1.5; tsh 0.028 ammonia 56;  12-28-13: glucose 87; bun 20; creat 1.5; k+4.7; na++140 02-08-14: tsh 0.372  04-26-14: glucose 81; bun 17; creat 1.33; k+4.7; na++139; phos 4.5; psa 1.24 08-14-14: wbc 8.8; hgb 8.2; hct 28.5; mcv 71.8; plt 250; glucose 72; bun 13; creat 1.13; k+ 4.7; na++140; liver normal albumin 3.2; phos 3.3; mag 1.6; tsh 0.298  08-26-14: wbc 11.0; hgb 7.7; hct 27.1; mcv 74.5. ;plt 297; glucose 124; bun 19; creat 1.21; k+3.6; na++141; liver normal albumin 2.9; urine culture: gram neg rods.     Review of Systems Constitutional: Negative for appetite change and fatigue.  HENT: Negative for congestion.   Respiratory: Negative for cough, chest tightness and shortness of breath.     Cardiovascular: Negative for chest pain, palpitations and leg swelling.  Gastrointestinal: Negative for nausea, abdominal pain, diarrhea and constipation.  Musculoskeletal: Negative for myalgias and arthralgias.  Skin: Negative for pallor.  Psychiatric/Behavioral: The patient is not nervous/anxious.   Physical Exam Constitutional: He appears well-developed and well-nourished. No distress.  Eyes: Conjunctivae are normal.  Neck: Neck supple. No JVD present. No thyromegaly present.  Cardiovascular: Normal rate, regular rhythm and intact distal pulses.   Respiratory: Effort normal and breath sounds normal. No respiratory distress. He has no wheezes.  GI: Soft. Bowel sounds are normal. He exhibits no distension. There is no tenderness.  Has left lower quad colostomy   Has s/p foley  Musculoskeletal: He exhibits no edema.  Able to move upper extremities Does not move lower extremities    Lymphadenopathy:    He has no cervical adenopathy.  Neurological: He is alert.  Skin: Skin is warm and dry. He is not diaphoretic.  Psychiatric: He has a normal mood and affect    ASSESSMENT/ PLAN:  1. Afib: his heart rate is stable; will continue amiodarone 200 mg daily for rate control; will continue asa 81 mg daily will monitor his status.   2. Hypokalemia: stable not on medications will monitor   3. Hypothyroidism: will continue synthroid 125 mcg daily last tsh was 0.372. Will monitor  4. Gerd: will continue prevacid 30 mg daily and reglan 5 mg every 6 hours as needed and will monitor  5.  COPD: he is stable is 02 dependent; will continue breo ellipta 100/25 daily   6. Depression: is emotionally stable will continue lexapro 10 mg daily; his weight is stable at 181 pounds    7. Constipation: is presently not on medications will monitor.   8. Chronic pain: he states he is not having pain at this time; will continue roxanol 5 mg  every 6 hours as needed   9. Insomnia: will continue melatonin  3 mg as needed at night  10. Urine retention: has a s/p foley; will setup appointment with urology and will continue to monitor his status.  12. UTI: his preliminary culture came back from [facility] with gram neg rods. Will being ceftin 500 mg twice daily for 2 weeks with florastor twice daily for 3 weeks for probiotics. Will make any necessary change upon his final report.         Synthia Innocent NP [facility]  Contact [phone] Monday through Friday 8am- 5pm  After hours call [phone]

## 2014-08-28 NOTE — Progress Notes (Signed)
Patient ID: Craig [name], male   DOB: 12/22/1945, 69 y.o.   MRN: [id number]    Facility: [facility]      No Known Allergies  Chief Complaint  Patient presents with  . Medical Management of Chronic Issues    HPI:  He is a long term resident of this facility being seen for the management of his chronic illnesses. Overall his status is stable. He is not voicing any concerns or complaints today. There are no nursing concerns at this time.    Past Medical History  Diagnosis Date  . COPD (chronic obstructive pulmonary disease)   . Hypertension   . Myocardial infarction   . A-fib   . GERD (gastroesophageal reflux disease)   . Hepatitis B 04/27/2013  . Dysphagia 04/27/2013  . Diverticulosis 03/05/2013  . Sepsis 04/26/2013    Past Surgical History  Procedure Laterality Date  . Colostomy    . Peg tube placement    . Peg tube removal      VITAL SIGNS BP 142/70 mmHg  Pulse 86  Ht  (1.778 m)  Wt 179 lb (81.194 kg)  BMI 25.68 kg/m2  SpO2 94%  Patient's Medications  New Prescriptions   No medications on file  Previous Medications   AMIODARONE (PACERONE) 200 MG TABLET    Take 200 mg by mouth daily.   ASPIRIN EC 81 MG TABLET    Take 81 mg by mouth daily.   CEPHALEXIN (KEFLEX) 500 MG CAPSULE    Take 1 capsule (500 mg total) by mouth 4 (four) times daily.   CETIRIZINE (ZYRTEC) 10 MG TABLET    Take 10 mg by mouth daily.   ESCITALOPRAM (LEXAPRO) 10 MG TABLET    Take 10 mg by mouth daily.   FLUTICASONE FUROATE-VILANTEROL (BREO ELLIPTA) 100-25 MCG/INH AEPB    Inhale 1 puff into the lungs daily.    FOLIC ACID (FOLVITE) 1 MG TABLET    Take 1 mg by mouth daily.   HYDROXYZINE (ATARAX/VISTARIL) 25 MG TABLET    Take 25 mg by mouth every 6 (six) hours as needed for itching.   K PHOS MONO-SOD PHOS DI & MONO (PHOSPHA 250 NEUTRAL PO)    Take 250 mg by mouth 2 (two) times daily.    LANSOPRAZOLE (PREVACID SOLUTAB) 30 MG DISINTEGRATING TABLET    Take 30 mg by mouth daily.     LEVOTHYROXINE (SYNTHROID, LEVOTHROID) 112 MCG TABLET    Take 112 mcg by mouth daily before breakfast.   MELATONIN 3 MG TABS    Take 1 tablet by mouth at bedtime as needed (sleep).    MORPHINE 20 MG/5ML SOLUTION    Take 5 mg by mouth every 6 (six) hours as needed for pain.    MULTIPLE VITAMINS-MINERALS (DECUBI-VITE PO)    Take 1 capsule by mouth daily.   PROMETHAZINE (PHENERGAN) 25 MG TABLET    Take 25 mg by mouth every 6 (six) hours as needed for nausea or vomiting.   THIAMINE 100 MG TABLET    Take 100 mg by mouth daily.  Modified Medications   No medications on file  Discontinued Medications   No medications on file     SIGNIFICANT DIAGNOSTIC EXAMS  10-31-13: kub: no acute abnormality  12-16-13: chest x-ray; right lower lobe atelectasis   LABS REVIEWED:   12-19-13: wbc 12.7; hg 8.1; hct 28.3; mcv 65.2; plt 347;glucose 70; bun 18; creat 1.52; k+5.5; na++136; liver normal albumin 2.8; phos 4.0; mag 1.5; tsh 0.028 ammonia  56;  12-28-13: glucose 87; bun 20; creat 1.5; k+4.7; na++140 02-08-14: tsh 0.372  04-26-14: glucose 81; bun 17; creat 1.33; k+4.7; na++139; phos 4.5; psa 1.24      Review of Systems  Constitutional: Negative for appetite change and fatigue.  HENT: Negative for congestion.   Respiratory: Negative for cough, chest tightness and shortness of breath.   Cardiovascular: Negative for chest pain, palpitations and leg swelling.  Gastrointestinal: Negative for nausea, abdominal pain, diarrhea and constipation.  Musculoskeletal: Negative for myalgias and arthralgias.  Skin: Negative for pallor.  Psychiatric/Behavioral: The patient is not nervous/anxious.       Physical Exam  Constitutional: He appears well-developed and well-nourished. No distress.  Eyes: Conjunctivae are normal.  Neck: Neck supple. No JVD present. No thyromegaly present.  Cardiovascular: Normal rate, regular rhythm and intact distal pulses.   Respiratory: Effort normal and breath sounds normal. No  respiratory distress. He has no wheezes.  GI: Soft. Bowel sounds are normal. He exhibits no distension. There is no tenderness.  Has left lower quad colostomy   Musculoskeletal: He exhibits no edema.  Able to move upper extremities Does not move lower extremities    Lymphadenopathy:    He has no cervical adenopathy.  Neurological: He is alert.  Skin: Skin is warm and dry. He is not diaphoretic.  Psychiatric: He has a normal mood and affect.       ASSESSMENT/ PLAN:  1. Afib: his heart rate is stable; will continue amiodarone 200 mg daily for rate control; will continue asa 81 mg daily will monitor his status.   2. Hypokalemia: stable not on medications will monitor   3. Hypothyroidism: will continue synthroid 125 mcg daily last tsh was 0.372. Will monitor  4. Gerd: will continue prevacid 30 mg daily and reglan 5 mg every 6 hours as needed and will monitor  5.  COPD: he is stable is 02 dependent; will continue breo ellipta 100/25 daily   6. Depression: is emotionally stable will continue lexapro 10 mg daily; his weight is stable at 181 pounds    7. Constipation: is presently not on medications will monitor.   8. Chronic pain: he states he is not having pain at this time; will continue roxanol 5 mg  every 6 hours as needed   9. Insomnia: will continue melatonin 3 mg as needed at night   Will have nursing check blood pressure twice daily   Will check cbc; cmp; tsh; mag    Synthia Innocent NP [facility]  Contact [phone] Monday through Friday 8am- 5pm  After hours call [phone]

## 2014-08-29 LAB — URINE CULTURE

## 2014-08-31 ENCOUNTER — Other Ambulatory Visit (HOSPITAL_COMMUNITY): Payer: Self-pay | Admitting: Urology

## 2014-08-31 ENCOUNTER — Telehealth (HOSPITAL_BASED_OUTPATIENT_CLINIC_OR_DEPARTMENT_OTHER): Payer: Self-pay | Admitting: Emergency Medicine

## 2014-08-31 DIAGNOSIS — IMO0002 Reserved for concepts with insufficient information to code with codable children: Secondary | ICD-10-CM

## 2014-08-31 DIAGNOSIS — N359 Urethral stricture, unspecified: Secondary | ICD-10-CM

## 2014-08-31 NOTE — Telephone Encounter (Signed)
Urine culture results faxed per order, Fayrene Helper PA, faxed to [facility] after confirming that pt is a client there, faxed to [phone]

## 2014-09-22 ENCOUNTER — Ambulatory Visit (HOSPITAL_COMMUNITY): Admission: RE | Admit: 2014-09-22 | Payer: Medicare Other | Source: Ambulatory Visit

## 2014-10-02 ENCOUNTER — Non-Acute Institutional Stay (SKILLED_NURSING_FACILITY): Payer: Medicare Other | Admitting: Adult Health

## 2014-10-02 DIAGNOSIS — G8929 Other chronic pain: Secondary | ICD-10-CM

## 2014-10-02 DIAGNOSIS — I482 Chronic atrial fibrillation, unspecified: Secondary | ICD-10-CM

## 2014-10-02 DIAGNOSIS — D638 Anemia in other chronic diseases classified elsewhere: Secondary | ICD-10-CM

## 2014-10-02 DIAGNOSIS — I10 Essential (primary) hypertension: Secondary | ICD-10-CM

## 2014-10-02 DIAGNOSIS — J438 Other emphysema: Secondary | ICD-10-CM

## 2014-10-02 DIAGNOSIS — K219 Gastro-esophageal reflux disease without esophagitis: Secondary | ICD-10-CM

## 2014-10-02 DIAGNOSIS — F32A Depression, unspecified: Secondary | ICD-10-CM

## 2014-10-02 DIAGNOSIS — E039 Hypothyroidism, unspecified: Secondary | ICD-10-CM

## 2014-10-02 DIAGNOSIS — F329 Major depressive disorder, single episode, unspecified: Secondary | ICD-10-CM | POA: Diagnosis not present

## 2014-10-05 ENCOUNTER — Ambulatory Visit (HOSPITAL_COMMUNITY)
Admission: RE | Admit: 2014-10-05 | Discharge: 2014-10-05 | Disposition: A | Discharge status: Home / Self Care | Payer: Medicare Other | Source: Ambulatory Visit | Attending: Urology | Admitting: Urology

## 2014-10-05 DIAGNOSIS — N359 Urethral stricture, unspecified: Secondary | ICD-10-CM | POA: Insufficient documentation

## 2014-10-05 DIAGNOSIS — IMO0002 Reserved for concepts with insufficient information to code with codable children: Secondary | ICD-10-CM

## 2014-10-20 ENCOUNTER — Non-Acute Institutional Stay (SKILLED_NURSING_FACILITY): Payer: Medicare Other | Admitting: Adult Health

## 2014-10-20 ENCOUNTER — Encounter: Payer: Self-pay | Admitting: Adult Health

## 2014-10-20 DIAGNOSIS — R630 Anorexia: Secondary | ICD-10-CM | POA: Diagnosis not present

## 2014-10-20 DIAGNOSIS — IMO0002 Reserved for concepts with insufficient information to code with codable children: Secondary | ICD-10-CM

## 2014-10-20 DIAGNOSIS — K651 Peritoneal abscess: Secondary | ICD-10-CM | POA: Diagnosis not present

## 2014-10-20 DIAGNOSIS — N179 Acute kidney failure, unspecified: Secondary | ICD-10-CM | POA: Diagnosis not present

## 2014-10-21 ENCOUNTER — Encounter (HOSPITAL_COMMUNITY): Payer: Self-pay | Admitting: Emergency Medicine

## 2014-10-21 ENCOUNTER — Emergency Department (HOSPITAL_COMMUNITY): Payer: Medicare Other

## 2014-10-21 ENCOUNTER — Inpatient Hospital Stay (HOSPITAL_COMMUNITY)
Admission: EM | Admit: 2014-10-21 | Discharge: 2014-10-23 | DRG: 579 | Disposition: A | Discharge status: Skilled Nursing Facility | Payer: Medicare Other | Attending: Internal Medicine | Admitting: Internal Medicine

## 2014-10-21 DIAGNOSIS — Z7982 Long term (current) use of aspirin: Secondary | ICD-10-CM

## 2014-10-21 DIAGNOSIS — J9611 Chronic respiratory failure with hypoxia: Secondary | ICD-10-CM | POA: Diagnosis present

## 2014-10-21 DIAGNOSIS — E039 Hypothyroidism, unspecified: Secondary | ICD-10-CM | POA: Diagnosis present

## 2014-10-21 DIAGNOSIS — I4891 Unspecified atrial fibrillation: Secondary | ICD-10-CM | POA: Diagnosis present

## 2014-10-21 DIAGNOSIS — I480 Paroxysmal atrial fibrillation: Secondary | ICD-10-CM | POA: Diagnosis present

## 2014-10-21 DIAGNOSIS — Z7401 Bed confinement status: Secondary | ICD-10-CM

## 2014-10-21 DIAGNOSIS — Z87891 Personal history of nicotine dependence: Secondary | ICD-10-CM | POA: Diagnosis not present

## 2014-10-21 DIAGNOSIS — J690 Pneumonitis due to inhalation of food and vomit: Secondary | ICD-10-CM | POA: Diagnosis not present

## 2014-10-21 DIAGNOSIS — Z8249 Family history of ischemic heart disease and other diseases of the circulatory system: Secondary | ICD-10-CM

## 2014-10-21 DIAGNOSIS — K219 Gastro-esophageal reflux disease without esophagitis: Secondary | ICD-10-CM | POA: Diagnosis present

## 2014-10-21 DIAGNOSIS — F329 Major depressive disorder, single episode, unspecified: Secondary | ICD-10-CM | POA: Diagnosis present

## 2014-10-21 DIAGNOSIS — Z933 Colostomy status: Secondary | ICD-10-CM | POA: Diagnosis not present

## 2014-10-21 DIAGNOSIS — L8990 Pressure ulcer of unspecified site, unspecified stage: Secondary | ICD-10-CM | POA: Diagnosis present

## 2014-10-21 DIAGNOSIS — Z66 Do not resuscitate: Secondary | ICD-10-CM | POA: Diagnosis not present

## 2014-10-21 DIAGNOSIS — J9621 Acute and chronic respiratory failure with hypoxia: Secondary | ICD-10-CM | POA: Diagnosis not present

## 2014-10-21 DIAGNOSIS — J189 Pneumonia, unspecified organism: Secondary | ICD-10-CM | POA: Diagnosis present

## 2014-10-21 DIAGNOSIS — Z9981 Dependence on supplemental oxygen: Secondary | ICD-10-CM

## 2014-10-21 DIAGNOSIS — I252 Old myocardial infarction: Secondary | ICD-10-CM

## 2014-10-21 DIAGNOSIS — Z515 Encounter for palliative care: Secondary | ICD-10-CM | POA: Diagnosis not present

## 2014-10-21 DIAGNOSIS — Z6820 Body mass index (BMI) 20.0-20.9, adult: Secondary | ICD-10-CM

## 2014-10-21 DIAGNOSIS — J449 Chronic obstructive pulmonary disease, unspecified: Secondary | ICD-10-CM | POA: Diagnosis present

## 2014-10-21 DIAGNOSIS — D72829 Elevated white blood cell count, unspecified: Secondary | ICD-10-CM | POA: Diagnosis present

## 2014-10-21 DIAGNOSIS — D62 Acute posthemorrhagic anemia: Secondary | ICD-10-CM | POA: Diagnosis present

## 2014-10-21 DIAGNOSIS — F32A Depression, unspecified: Secondary | ICD-10-CM | POA: Diagnosis present

## 2014-10-21 DIAGNOSIS — Z79899 Other long term (current) drug therapy: Secondary | ICD-10-CM

## 2014-10-21 DIAGNOSIS — D638 Anemia in other chronic diseases classified elsewhere: Secondary | ICD-10-CM | POA: Diagnosis present

## 2014-10-21 DIAGNOSIS — L02211 Cutaneous abscess of abdominal wall: Secondary | ICD-10-CM | POA: Diagnosis present

## 2014-10-21 DIAGNOSIS — K651 Peritoneal abscess: Secondary | ICD-10-CM

## 2014-10-21 DIAGNOSIS — J438 Other emphysema: Secondary | ICD-10-CM | POA: Diagnosis not present

## 2014-10-21 DIAGNOSIS — R1310 Dysphagia, unspecified: Secondary | ICD-10-CM | POA: Diagnosis present

## 2014-10-21 DIAGNOSIS — I482 Chronic atrial fibrillation: Secondary | ICD-10-CM

## 2014-10-21 DIAGNOSIS — E43 Unspecified severe protein-calorie malnutrition: Secondary | ICD-10-CM | POA: Diagnosis present

## 2014-10-21 DIAGNOSIS — Y95 Nosocomial condition: Secondary | ICD-10-CM | POA: Diagnosis present

## 2014-10-21 DIAGNOSIS — I10 Essential (primary) hypertension: Secondary | ICD-10-CM | POA: Diagnosis present

## 2014-10-21 DIAGNOSIS — IMO0002 Reserved for concepts with insufficient information to code with codable children: Secondary | ICD-10-CM | POA: Diagnosis present

## 2014-10-21 LAB — HEPATIC FUNCTION PANEL
ALBUMIN: 2.3 g/dL — AB (ref 3.5–5.0)
ALK PHOS: 56 U/L (ref 38–126)
ALT: 8 U/L — ABNORMAL LOW (ref 17–63)
AST: 10 U/L — ABNORMAL LOW (ref 15–41)
BILIRUBIN TOTAL: 0.5 mg/dL (ref 0.3–1.2)
Total Protein: 7.1 g/dL (ref 6.5–8.1)

## 2014-10-21 LAB — CBC WITH DIFFERENTIAL/PLATELET
Basophils Absolute: 0 10*3/uL (ref 0.0–0.1)
Basophils Relative: 0 %
EOS ABS: 0.3 10*3/uL (ref 0.0–0.7)
Eosinophils Relative: 2 %
HCT: 25.4 % — ABNORMAL LOW (ref 39.0–52.0)
Hemoglobin: 7.6 g/dL — ABNORMAL LOW (ref 13.0–17.0)
LYMPHS ABS: 2.5 10*3/uL (ref 0.7–4.0)
Lymphocytes Relative: 16 %
MCH: 20.3 pg — ABNORMAL LOW (ref 26.0–34.0)
MCHC: 29.9 g/dL — ABNORMAL LOW (ref 30.0–36.0)
MCV: 67.9 fL — ABNORMAL LOW (ref 78.0–100.0)
MONO ABS: 1.3 10*3/uL — AB (ref 0.1–1.0)
Monocytes Relative: 8 %
NEUTROS PCT: 74 %
Neutro Abs: 11.6 10*3/uL — ABNORMAL HIGH (ref 1.7–7.7)
PLATELETS: 402 10*3/uL — AB (ref 150–400)
RBC: 3.74 MIL/uL — AB (ref 4.22–5.81)
RDW: 18.8 % — AB (ref 11.5–15.5)
WBC: 15.7 10*3/uL — AB (ref 4.0–10.5)

## 2014-10-21 LAB — COMPREHENSIVE METABOLIC PANEL
ALT: 9 U/L — AB (ref 17–63)
AST: 14 U/L — AB (ref 15–41)
Albumin: 2.7 g/dL — ABNORMAL LOW (ref 3.5–5.0)
Alkaline Phosphatase: 65 U/L (ref 38–126)
Anion gap: 8 (ref 5–15)
BUN: 19 mg/dL (ref 6–20)
CHLORIDE: 98 mmol/L — AB (ref 101–111)
CO2: 29 mmol/L (ref 22–32)
Calcium: 8.8 mg/dL — ABNORMAL LOW (ref 8.9–10.3)
Creatinine, Ser: 1.14 mg/dL (ref 0.61–1.24)
GFR calc non Af Amer: >60 mL/min (ref >60)
Glucose, Bld: 87 mg/dL (ref 65–99)
POTASSIUM: 3.8 mmol/L (ref 3.5–5.1)
SODIUM: 135 mmol/L (ref 135–145)
Total Bilirubin: 0.5 mg/dL (ref 0.3–1.2)
Total Protein: 8 g/dL (ref 6.5–8.1)

## 2014-10-21 LAB — EXPECTORATED SPUTUM ASSESSMENT W REFEX TO RESP CULTURE

## 2014-10-21 LAB — MRSA PCR SCREENING: MRSA BY PCR: POSITIVE — AB

## 2014-10-21 LAB — PREPARE RBC (CROSSMATCH)

## 2014-10-21 LAB — CBC
HCT: 22.9 % — ABNORMAL LOW (ref 39.0–52.0)
HEMOGLOBIN: 6.8 g/dL — AB (ref 13.0–17.0)
MCH: 20.1 pg — ABNORMAL LOW (ref 26.0–34.0)
MCHC: 29.7 g/dL — AB (ref 30.0–36.0)
MCV: 67.8 fL — ABNORMAL LOW (ref 78.0–100.0)
PLATELETS: 318 10*3/uL (ref 150–400)
RBC: 3.38 MIL/uL — ABNORMAL LOW (ref 4.22–5.81)
RDW: 18.8 % — AB (ref 11.5–15.5)
WBC: 12.1 10*3/uL — ABNORMAL HIGH (ref 4.0–10.5)

## 2014-10-21 LAB — CREATININE, SERUM
CREATININE: 1.17 mg/dL (ref 0.61–1.24)
GFR calc Af Amer: >60 mL/min (ref >60)
GFR calc non Af Amer: >60 mL/min (ref >60)

## 2014-10-21 LAB — MAGNESIUM: MAGNESIUM: 1.5 mg/dL — AB (ref 1.7–2.4)

## 2014-10-21 LAB — ABO/RH: ABO/RH(D): O POS

## 2014-10-21 MED ORDER — LORATADINE 10 MG PO TABS
10.0000 mg | ORAL_TABLET | Freq: Every day | ORAL | Status: DC
  Administered 2014-10-22 – 2014-10-23 (×2): 10 mg via ORAL
  Filled 2014-10-21 (×2): qty 1

## 2014-10-21 MED ORDER — SODIUM CHLORIDE 0.9 % IV SOLN
INTRAVENOUS | Status: AC
  Administered 2014-10-21: 13:00:00 via INTRAVENOUS

## 2014-10-21 MED ORDER — FUROSEMIDE 10 MG/ML IJ SOLN: 20.0000 mg | Freq: Once | INTRAMUSCULAR | Status: DC

## 2014-10-21 MED ORDER — MORPHINE SULFATE (PF) 2 MG/ML IV SOLN: 1.0000 mg | INTRAVENOUS | Status: DC | PRN

## 2014-10-21 MED ORDER — LANSOPRAZOLE 30 MG PO TBDP: 30.0000 mg | ORAL_TABLET | Freq: Every day | ORAL | Status: DC

## 2014-10-21 MED ORDER — CHLORHEXIDINE GLUCONATE CLOTH 2 % EX PADS
6.0000 | MEDICATED_PAD | Freq: Every day | CUTANEOUS | Status: DC
  Administered 2014-10-22 – 2014-10-23 (×2): 6 via TOPICAL

## 2014-10-21 MED ORDER — IOHEXOL 300 MG/ML  SOLN
100.0000 mL | Freq: Once | INTRAMUSCULAR | Status: AC | PRN
  Administered 2014-10-21: 100 mL via INTRAVENOUS

## 2014-10-21 MED ORDER — PIPERACILLIN-TAZOBACTAM 3.375 G IVPB
3.3750 g | Freq: Once | INTRAVENOUS | Status: AC
  Administered 2014-10-21: 3.375 g via INTRAVENOUS
  Filled 2014-10-21: qty 50

## 2014-10-21 MED ORDER — LIDOCAINE HCL 2 % IJ SOLN
INTRAMUSCULAR | Status: AC
  Administered 2014-10-21: 400 mg
  Filled 2014-10-21: qty 20

## 2014-10-21 MED ORDER — ASPIRIN EC 81 MG PO TBEC
81.0000 mg | DELAYED_RELEASE_TABLET | Freq: Every day | ORAL | Status: DC
  Administered 2014-10-21 – 2014-10-23 (×3): 81 mg via ORAL
  Filled 2014-10-21 (×3): qty 1

## 2014-10-21 MED ORDER — SODIUM CHLORIDE 0.9 % IV SOLN: Freq: Once | INTRAVENOUS | Status: DC

## 2014-10-21 MED ORDER — FLUTICASONE FUROATE-VILANTEROL 100-25 MCG/INH IN AEPB: 1.0000 | INHALATION_SPRAY | Freq: Every day | RESPIRATORY_TRACT | Status: DC

## 2014-10-21 MED ORDER — LIDOCAINE-EPINEPHRINE 2 %-1:100000 IJ SOLN
20.0000 mL | Freq: Once | INTRAMUSCULAR | Status: AC
Start: 2014-10-21 — End: 2014-10-21
  Administered 2014-10-21: 1 mL via INTRADERMAL
  Filled 2014-10-21: qty 20

## 2014-10-21 MED ORDER — ONDANSETRON HCL 4 MG/2ML IJ SOLN: 4.0000 mg | Freq: Four times a day (QID) | INTRAMUSCULAR | Status: DC | PRN

## 2014-10-21 MED ORDER — IPRATROPIUM-ALBUTEROL 0.5-2.5 (3) MG/3ML IN SOLN: 3.0000 mL | Freq: Four times a day (QID) | RESPIRATORY_TRACT | Status: DC | PRN

## 2014-10-21 MED ORDER — VITAMIN B-1 100 MG PO TABS
100.0000 mg | ORAL_TABLET | Freq: Every day | ORAL | Status: DC
  Administered 2014-10-21 – 2014-10-23 (×3): 100 mg via ORAL
  Filled 2014-10-21 (×5): qty 1

## 2014-10-21 MED ORDER — LEVOTHYROXINE SODIUM 100 MCG PO TABS
100.0000 ug | ORAL_TABLET | Freq: Every day | ORAL | Status: DC
  Administered 2014-10-22: 100 ug via ORAL
  Filled 2014-10-21: qty 1

## 2014-10-21 MED ORDER — SODIUM CHLORIDE 0.9 % IJ SOLN
3.0000 mL | Freq: Two times a day (BID) | INTRAMUSCULAR | Status: DC
  Administered 2014-10-21: 3 mL via INTRAVENOUS

## 2014-10-21 MED ORDER — HEPARIN SODIUM (PORCINE) 5000 UNIT/ML IJ SOLN
5000.0000 [IU] | Freq: Three times a day (TID) | INTRAMUSCULAR | Status: DC
  Administered 2014-10-21: 5000 [IU] via SUBCUTANEOUS
  Filled 2014-10-21: qty 1

## 2014-10-21 MED ORDER — K PHOS MONO-SOD PHOS DI & MONO 155-852-130 MG PO TABS
250.0000 mg | ORAL_TABLET | Freq: Two times a day (BID) | ORAL | Status: DC
  Administered 2014-10-21 – 2014-10-23 (×5): 250 mg via ORAL
  Filled 2014-10-21 (×7): qty 1

## 2014-10-21 MED ORDER — PIPERACILLIN-TAZOBACTAM 3.375 G IVPB
3.3750 g | Freq: Three times a day (TID) | INTRAVENOUS | Status: DC
  Administered 2014-10-21 – 2014-10-23 (×5): 3.375 g via INTRAVENOUS
  Filled 2014-10-21 (×4): qty 50

## 2014-10-21 MED ORDER — ACETAMINOPHEN 650 MG RE SUPP: 650.0000 mg | Freq: Four times a day (QID) | RECTAL | Status: DC | PRN

## 2014-10-21 MED ORDER — INFLUENZA VAC SPLIT QUAD 0.5 ML IM SUSY
0.5000 mL | PREFILLED_SYRINGE | INTRAMUSCULAR | Status: DC
Start: 2014-10-22 — End: 2014-10-23
  Filled 2014-10-21 (×2): qty 0.5

## 2014-10-21 MED ORDER — IOHEXOL 300 MG/ML  SOLN
50.0000 mL | Freq: Once | INTRAMUSCULAR | Status: AC | PRN
  Administered 2014-10-21: 50 mL via ORAL

## 2014-10-21 MED ORDER — AMIODARONE HCL 200 MG PO TABS
200.0000 mg | ORAL_TABLET | Freq: Every day | ORAL | Status: DC
  Administered 2014-10-21 – 2014-10-23 (×3): 200 mg via ORAL
  Filled 2014-10-21 (×3): qty 1

## 2014-10-21 MED ORDER — MORPHINE SULFATE (CONCENTRATE) 10 MG/0.5ML PO SOLN: 5.0000 mg | Freq: Three times a day (TID) | ORAL | Status: DC | PRN

## 2014-10-21 MED ORDER — MUPIROCIN 2 % EX OINT
1.0000 "application " | TOPICAL_OINTMENT | Freq: Two times a day (BID) | CUTANEOUS | Status: DC
  Filled 2014-10-21: qty 22

## 2014-10-21 MED ORDER — PANTOPRAZOLE SODIUM 40 MG PO TBEC
40.0000 mg | DELAYED_RELEASE_TABLET | Freq: Every day | ORAL | Status: DC
  Administered 2014-10-21 – 2014-10-23 (×3): 40 mg via ORAL
  Filled 2014-10-21 (×3): qty 1

## 2014-10-21 MED ORDER — ONDANSETRON HCL 4 MG PO TABS: 4.0000 mg | ORAL_TABLET | Freq: Four times a day (QID) | ORAL | Status: DC | PRN

## 2014-10-21 MED ORDER — HYDROXYZINE HCL 25 MG PO TABS: 25.0000 mg | ORAL_TABLET | Freq: Four times a day (QID) | ORAL | Status: DC | PRN

## 2014-10-21 MED ORDER — ESCITALOPRAM OXALATE 10 MG PO TABS
10.0000 mg | ORAL_TABLET | Freq: Every day | ORAL | Status: DC
  Administered 2014-10-21 – 2014-10-23 (×3): 10 mg via ORAL
  Filled 2014-10-21 (×3): qty 1

## 2014-10-21 MED ORDER — FOLIC ACID 1 MG PO TABS
1.0000 mg | ORAL_TABLET | Freq: Every day | ORAL | Status: DC
  Administered 2014-10-21 – 2014-10-23 (×3): 1 mg via ORAL
  Filled 2014-10-21 (×3): qty 1

## 2014-10-21 MED ORDER — RISAQUAD PO CAPS
1.0000 | ORAL_CAPSULE | Freq: Every day | ORAL | Status: DC
  Administered 2014-10-21 – 2014-10-23 (×3): 1 via ORAL
  Filled 2014-10-21 (×4): qty 1

## 2014-10-21 MED ORDER — ACETAMINOPHEN 325 MG PO TABS: 650.0000 mg | ORAL_TABLET | Freq: Four times a day (QID) | ORAL | Status: DC | PRN

## 2014-10-21 MED ORDER — VANCOMYCIN HCL IN DEXTROSE 1-5 GM/200ML-% IV SOLN
1000.0000 mg | Freq: Two times a day (BID) | INTRAVENOUS | Status: DC
  Administered 2014-10-22 – 2014-10-23 (×3): 1000 mg via INTRAVENOUS
  Filled 2014-10-21 (×2): qty 200

## 2014-10-21 MED ORDER — VANCOMYCIN HCL IN DEXTROSE 1-5 GM/200ML-% IV SOLN
1000.0000 mg | Freq: Once | INTRAVENOUS | Status: AC
  Administered 2014-10-21: 1000 mg via INTRAVENOUS
  Filled 2014-10-21: qty 200

## 2014-10-21 NOTE — Progress Notes (Signed)
Consult request received. Chart reviewed. Discussed with Dr Gwenlyn Perking.   Patient resting in bed, with covers up his face. He doesn't verbalize much. He doesn't wish to engage. Patient states that he has had a long day today and wishes to get some rest.   Call placed to emergency contact listed as a friend, Efraim Vanallen at [phone]. Mr Effie Shy is a long time friend of the patient. He states that the patient has a brother in [place], [place] but that he is in "worse shape than Will Boliver". It is not known if the patient has a spouse or a mother. He apparently also has a sister who lives in [place]. Mr Effie Shy states he has enrolled the patient into [facility] and checks on him there frequently. Mr Effie Shy is not in town on 10-22-14 but will come to the hospital on 10-23-14.   Will attempt to see Mr Buckle again in am on 10-22-14 to engage with him about his medical conditions, to discuss with him about his code status and goals of care etc. Further recommendations will follow.  Thank you for the consult.   Rosalin Hawking MD [phone] [facility] medicine team.

## 2014-10-21 NOTE — Progress Notes (Signed)
CRITICAL VALUE ALERT  Critical value received:  hgb 6.8   Date of notification:  10/21/14    Time of notification:  1416   Critical value read back:Yes.    Nurse who received alert:  Raynald Blend   MD notified (1st page): Dr. Gwenlyn Perking Time of first page: 1418 MD notified (2nd page):  Time of second page:  Responding MD:  Dr. Gwenlyn Perking  Time MD responded:  [phone]

## 2014-10-21 NOTE — H&P (Signed)
[facility] History and Physical  Demarri Elie ZOX:[id number] DOB: January 16, 1945 DOA: 10/21/2014  Referring physician: Dr. Nicanor Alcon  PCP: Kirt Boys, DO   Chief Complaint: left abd wall abscess  HPI: Craig [name] is a 69 y.o. male with PMH significant for GERD, chronic resp failure on Oxygen due to COPD, hx of Atrial Fib, HTN, chronic anemia, FTT, hx of diverting colostomy and dysphagia; who came from nursing home due to left abd pain and swelling in this area. Patient denies fever, chills, CP, nausea, vomiting, SOB or any other complaints. He reported swelling, pain and warm sensation on his abd has been going for the last 2 days or so. Patient in ED had CT abd that demonstrated abscess in this area and also presumed bilateral lower lobes infiltrates (with concerns for PNA, most likely aspiration, base on location). WBC's were elevated and the patient was referred for admission. CCS was consulted, but giving magnitude of medical problems and cormobidities TRH was ask to admit him.  Review of Systems:  Neg except as otherwise mentioned on HPI  Past Medical History  Diagnosis Date  . COPD (chronic obstructive pulmonary disease) (HCC)   . Hypertension   . Myocardial infarction (HCC)   . A-fib (HCC)   . GERD (gastroesophageal reflux disease)   . Hepatitis B 04/27/2013  . Dysphagia 04/27/2013  . Diverticulosis 03/05/2013  . Sepsis (HCC) 04/26/2013   Past Surgical History  Procedure Laterality Date  . Colostomy    . Peg tube placement    . Peg tube removal     Social History:  reports that he has quit smoking. He does not have any smokeless tobacco history on file. He reports that he does not drink alcohol or use illicit drugs.  No Known Allergies  Family hx: significant for HTN only according to patient. But very limited engagement during interview   Prior to Admission medications   Medication Sig Start Date End Date Taking? Authorizing Provider  amiodarone (PACERONE) 200  MG tablet Take 200 mg by mouth daily.   Yes Historical Provider, MD  aspirin EC 81 MG tablet Take 81 mg by mouth daily.   Yes Historical Provider, MD  Dextrose-Sodium Chloride (DEXTROSE 5 % AND 0.45% NACL) 5-0.45 % Inject 1,000 mLs into the vein 3 (three) times daily.   Yes Historical Provider, MD  doxycycline (DORYX) 100 MG EC tablet Take 100 mg by mouth 2 (two) times daily.   Yes Historical Provider, MD  escitalopram (LEXAPRO) 10 MG tablet Take 10 mg by mouth daily.   Yes Historical Provider, MD  FLORA-Q [facility]) CAPS capsule Take 1 capsule by mouth daily.   Yes Historical Provider, MD  Fluticasone Furoate-Vilanterol (BREO ELLIPTA) 100-25 MCG/INH AEPB Inhale 1 puff into the lungs daily.    Yes Historical Provider, MD  folic acid (FOLVITE) 1 MG tablet Take 1 mg by mouth daily.   Yes Historical Provider, MD  K Phos Mono-Sod Phos Di & Mono (PHOSPHA 250 NEUTRAL PO) Take 250 mg by mouth 2 (two) times daily.    Yes Historical Provider, MD  lansoprazole (PREVACID SOLUTAB) 30 MG disintegrating tablet Take 30 mg by mouth daily.   Yes Historical Provider, MD  levothyroxine (SYNTHROID, LEVOTHROID) 100 MCG tablet Take 100 mcg by mouth daily before breakfast.   Yes Historical Provider, MD  morphine 20 MG/5ML solution Take 5 mg by mouth every 6 (six) hours as needed for pain.    Yes Historical Provider, MD  Multiple Vitamins-Minerals (DECUBI-VITE PO) Take 1 capsule  by mouth daily.   Yes Historical Provider, MD  thiamine 100 MG tablet Take 100 mg by mouth daily.   Yes Historical Provider, MD  cephALEXin (KEFLEX) 500 MG capsule Take 1 capsule (500 mg total) by mouth 4 (four) times daily. Patient not taking: Reported on 10/21/2014 08/26/14   Joycie Peek, PA-C  cetirizine (ZYRTEC) 10 MG tablet Take 10 mg by mouth daily as needed for allergies.     Historical Provider, MD  hydrOXYzine (ATARAX/VISTARIL) 25 MG tablet Take 25 mg by mouth every 6 (six) hours as needed for itching.    Historical Provider, MD    Melatonin 3 MG TABS Take 1 tablet by mouth at bedtime as needed (sleep).     Historical Provider, MD  promethazine (PHENERGAN) 25 MG tablet Take 25 mg by mouth every 6 (six) hours as needed for nausea or vomiting.    Historical Provider, MD   Physical Exam: Filed Vitals:   10/21/14 0355 10/21/14 0703 10/21/14 0831 10/21/14 1457  BP: 133/54 124/45 131/51 126/42  Pulse: 91 87 83 79  Temp: 98 F (36.7 C)  97.6 F (36.4 C) 97.8 F (36.6 C)  TempSrc: Oral  Oral Oral  Resp: 18     SpO2: 92% 93% 100%     Wt Readings from Last 3 Encounters:  08/28/14 81.194 kg (179 lb)  08/11/14 81.194 kg (179 lb)  05/29/14 82.101 kg (181 lb)    General:  Appears calm and in no acute distress, denies CP and SOB. Patient is afebrile on my exam and status post I&D by CCS at bedside. Frail and underweight. Patient chronically ill in appearance  Eyes: PERRL, normal lids, irises & conjunctiva, no icterus, no nystagmus  ENT: grossly normal hearing, lips & tongue, no drainage out of ears or nostrils, Goff in place at 2 L. No thrush, no erythema. Poor dentition  Neck: no LAD, masses or thyromegaly. No JVD Cardiovascular: Regular rate, no rubs or gallops. No LE edema. Respiratory: decrease Bs at bases, positive rhonchi, no wheezing. Normal respiratory effort. Abdomen: soft, no guarding, colostomy with stools in the bag. Patient with left mid abdomen s/p I&D and dressings in place, positive serosanguineous material on dressings appreciated. Positive BS Skin: patient w/o petechiae, no bruises, legs with chronic dryness and desqumation appearance  Musculoskeletal: atrophy on his Le appreciated, no joint swelling  Psychiatric: flat affect and limited verbal interaction. No SI Neurologic: grossly non-focal.          Labs on Admission:  Basic Metabolic Panel:  Recent Labs Lab 10/21/14 0439 10/21/14 1343  NA 135  --   K 3.8  --   CL 98*  --   CO2 29  --   GLUCOSE 87  --   BUN 19  --   CREATININE 1.14 1.17   CALCIUM 8.8*  --   MG  --  1.5*   Liver Function Tests:  Recent Labs Lab 10/21/14 0439 10/21/14 1343  AST 14* 10*  ALT 9* 8*  ALKPHOS 65 56  BILITOT 0.5 0.5  PROT 8.0 7.1  ALBUMIN 2.7* 2.3*   CBC:  Recent Labs Lab 10/21/14 0439 10/21/14 1343  WBC 15.7* 12.1*  NEUTROABS 11.6*  --   HGB 7.6* 6.8*  HCT 25.4* 22.9*  MCV 67.9* 67.8*  PLT 402* 318   Radiological Exams on Admission: Ct Abdomen Pelvis W Contrast  10/21/2014   CLINICAL DATA:  Leukocytosis. Evaluate known abdominal abscess. Initial encounter.  EXAM: CT ABDOMEN AND PELVIS WITH CONTRAST  TECHNIQUE: Multidetector CT imaging of the abdomen and pelvis was performed using the standard protocol following bolus administration of intravenous contrast.  CONTRAST:  OMNIPAQUE IOHEXOL 300 MG/ML  SOLN  COMPARISON:  None.  FINDINGS: Prominent bronchiectasis is noted at the lower lung lobes, with patchy bibasilar airspace opacities. This may reflect an atypical infectious process. Aspiration cannot be excluded. A trace right pleural effusion is seen.  There is a focal collection of fluid and trace air at the anterior left mid abdominal wall, measuring approximately 8.1 x 2.1 x 5.3 cm, with peripheral enhancement. This appears to reflect abscess at the prior site of a G-tube placement, as the stomach is tamped up to the abdominal wall underlying the abscess. This may still be contiguous with the stomach.  The liver and spleen are unremarkable in appearance. The gallbladder is within normal limits. The pancreas and adrenal glands are unremarkable.  A 2.5 cm cyst is noted at the interpole region of the right kidney. A smaller 0.8 cm left renal cyst is noted. There is no evidence of hydronephrosis. No renal or ureteral stones are seen. No perinephric stranding is appreciated.  No free fluid is identified. The small bowel is unremarkable in appearance. The stomach is within normal limits. No acute vascular abnormalities are seen. Diffuse  calcification is noted along the abdominal aorta and its branches.  The appendix is normal in caliber, without evidence of appendicitis. Mild diverticulosis is noted along the distal transverse, descending and proximal sigmoid colon, without evidence of diverticulitis.  The patient's left lower quadrant colostomy site is unremarkable in appearance. The Hartmann's pouch is within normal limits. 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  The bladder is decompressed, with a suprapubic catheter in place. The prostate remains normal in size. No inguinal lymphadenopathy is seen.  No acute osseous abnormalities are identified. 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  IMPRESSION: 1. Focal collection of fluid and trace air at the anterior left mid abdominal wall, measuring 8.1 x 2.1 x 5.3 cm, with peripheral enhancement, compatible with an abscess. This appears to be at the prior site of a G-tube placement, as the stomach is tamped up to the abdominal wall underlying the abscess. This may still be contiguous with the stomach. 2. Prominent bronchiectasis at the lower lung lobes, with patchy bibasilar airspace opacities. This may reflect an atypical infectious process. Aspiration cannot be excluded. Trace right pleural effusion seen. 3. Left lower quadrant colostomy site is unremarkable in appearance. Hartmann's pouch is within normal limits. 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 Mild diverticulosis along the distal transverse, descending and proximal sigmoid colon, without evidence of diverticulitis. 4. Small bilateral renal cysts seen. 5. Diffuse calcification along the abdominal aorta and its branches. 6. 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 Mild  grade 1 anterolisthesis of L5 on S1, reflecting underlying chronic bilateral pars defects at L5. These results were called by telephone at the time of interpretation on 10/21/2014 at 6:27 am to [facility] PA, who verbally acknowledged these results.   Electronically Signed   By: Roanna Raider M.D.   On: 10/21/2014 06:27    EKG:  None   Assessment/Plan 1-abd abscess: patient hx of gastrostomy tube, no discontinue for over a year and is  presenting with left side abd abscess -elevated WBC's and low grade temp -CCS consulted and has performed I&D at bedside (some bleeding/purulent discharge appreciated) -patient on vanc and zosyn -will monitor VS and continue supportive care  2-Presumed PNA: seen on CT abd and pelvis; with concerns for Aspiration vs HCAP -will cover with zosyn and vancomycin -flutter valve -nebulizer treatment -will continue chronic oxygen supplementation -follow PNA protocol orders  3-Hypothyroidism: will continue synthroid -will check TSH  4-Depression: no SI or hallucinations -positive flat affect on exam -will continue home medication regimen  5-anemia or chronic disease: with acute blood loss component from I&D -Hgb down to 6.8 -will transfuse 2 units  6-A-fib (HCC): not on anticoagulation at home -will continue rate control and monitor on telemetry  7-chronic resp failure due to COPD (chronic obstructive pulmonary disease) (HCC): patient currently is not wheezing -will continue nebulizer treatment PNR -continue home inhaler regimen -continue Oxygen supplementation  8-Dysphagia: will use dysphagia 3 diet and thin liquids -will ask SPL to provide evaluation and recommendations  9-Leukocytosis: due to # 1 and 2 -will continue current antibiotics regimen -will follow WBC's trend  10-Protein-calorie malnutrition, severe (HCC): nutritional consult in place to assist with recommendation on feeding supplementations  11-GERD without esophagitis: will  continue PPI  12-Encounter for palliative care: patient with poor quality of life; essentially bed bound and with concerns for Asp PNA. -will ask for palliative consult for GOC and advance directives   13-hx of diverting colostomy: to assist healing decubitus ulcer -unclear when, where or by whom this procedure was done -overall colostomy appears stable.  CCS (Dr. Ezzard Standing)  Code Status: Full DVT Prophylaxis: SCD's Family Communication: no family at bedside Disposition Plan: inpatient, LOS > 2 midnights, no fatelemetry bed  Time spent: 70 minutes  Vassie Loll [facility] Pager [phone]

## 2014-10-21 NOTE — Progress Notes (Signed)
ANTIBIOTIC CONSULT NOTE - INITIAL  Pharmacy Consult for Vancomycin & Zosyn Indication: Abd wall abscess  No Known Allergies  Patient Measurements:   TBW: 81 kg in August  Vital Signs: Temp: 97.6 F (36.4 C) (10/08 0831) Temp Source: Oral (10/08 0831) BP: 131/51 mmHg (10/08 0831) Pulse Rate: 83 (10/08 0831) Intake/Output from previous day:   Intake/Output from this shift:    Labs:  Recent Labs  10/21/14 0439  WBC 15.7*  HGB 7.6*  PLT 402*  CREATININE 1.14   CrCl cannot be calculated (Unknown ideal weight.). No results for input(s): VANCOTROUGH, VANCOPEAK, VANCORANDOM, GENTTROUGH, GENTPEAK, GENTRANDOM, TOBRATROUGH, TOBRAPEAK, TOBRARND, AMIKACINPEAK, AMIKACINTROU, AMIKACIN in the last 72 hours.   Microbiology: Recent Results (from the past 720 hour(s))  Blood culture (routine x 2)     Status: None (Preliminary result)   Collection Time: 10/21/14  6:53 AM  Result Value Ref Range Status   Specimen Description BLOOD BLOOD LEFT FOREARM  Final   Special Requests   Final    BOTTLES DRAWN AEROBIC AND ANAEROBIC 5CC Performed at [facility]    Culture PENDING  Incomplete   Report Status PENDING  Incomplete  Blood culture (routine x 2)     Status: None (Preliminary result)   Collection Time: 10/21/14  6:53 AM  Result Value Ref Range Status   Specimen Description BLOOD RIGHT ANTECUBITAL  Final   Special Requests   Final    BOTTLES DRAWN AEROBIC AND ANAEROBIC 10cc Performed at [facility]    Culture PENDING  Incomplete   Report Status PENDING  Incomplete   Medical History: Past Medical History  Diagnosis Date  . COPD (chronic obstructive pulmonary disease) (HCC)   . Hypertension   . Myocardial infarction (HCC)   . A-fib (HCC)   . GERD (gastroesophageal reflux disease)   . Hepatitis B 04/27/2013  . Dysphagia 04/27/2013  . Diverticulosis 03/05/2013  . Sepsis (HCC) 04/26/2013   Medications:  Scheduled:  . acidophilus  1 capsule Oral Daily  .  amiodarone  200 mg Oral Daily  . aspirin EC  81 mg Oral Daily  . escitalopram  10 mg Oral Daily  . Fluticasone Furoate-Vilanterol  1 puff Inhalation Daily  . folic acid  1 mg Oral Daily  . heparin  5,000 Units Subcutaneous 3 times per day  . [START ON 10/22/2014] levothyroxine  100 mcg Oral QAC breakfast  . [START ON 10/22/2014] loratadine  10 mg Oral Daily  . pantoprazole  40 mg Oral Daily  . phosphorus  250 mg Oral BID  . sodium chloride  3 mL Intravenous Q12H  . thiamine  100 mg Oral Daily  . vancomycin  1,000 mg Intravenous Once   Anti-infectives    Start     Dose/Rate Route Frequency Ordered Stop   10/21/14 0645  vancomycin (VANCOCIN) IVPB 1000 mg/200 mL premix     1,000 mg 200 mL/hr over 60 Minutes Intravenous  Once 10/21/14 0630     10/21/14 0645  piperacillin-tazobactam (ZOSYN) IVPB 3.375 g     3.375 g 12.5 mL/hr over 240 Minutes Intravenous  Once 10/21/14 0630 10/21/14 1059     Assessment: 69 yoM from SNF with abd wall abscess at old G-tube site (removed 2015), was on Doxycyline po at SNF. Colostomy functional, colostomy site appears normal. Vancomycin and Zosyn ordered to begin in the ED, further dosing per pharmacy.    Goal of Therapy:  Vancomycin trough level 15-20 mcg/ml  Plan:   Zosyn 3.375gm q8-  4 hr infusion  Vancomycin 1gm IV q12  Aiming for higher trough value with abscess for now  Blood cx obtained, follow renal fx, vancomycin trough if needed  Otho Bellows PharmD Pager [phone] 10/21/2014, 12:30 PM

## 2014-10-21 NOTE — Op Note (Signed)
Name:  [name] MRN:  [id number] DOB:  [birth year]  Operative Note  PATIENT:  Craig [name], 69 y.o., male, MRN: [id number]  PREOP DIAGNOSIS:  Left abdominal wall abscess  POSTOP DIAGNOSIS:   Left abdominal wall abscess (12 x 6 cm)  PROCEDURE:   I&D of left abdominal wall abscess  SURGEON:   Ovidio Kin, M.D.  ANESTHESIA:   15 cc of 1% xylocaine  EBL:  30  ml   INDICATIONS FOR PROCEDURE:  [name] is a 69 y.o. (DOB: 14-Jan-1945) AA  male whose primary care physician is Kirt Boys, DO.  He was hospitalized today at [facility] for a left abdominal wall abscess.   I discussed the management of this abscess with him.  I will plan an I&D at the bedside.    The indications and risks of the surgery were explained to the patient.  The risks include, but are not limited to, bleeding, and nerve injury.  PROCEDURE:  The patient is in room 1435 at [facility].  He is in a supine position.   The permit was reviewed.   I painted the left abdomen with betadine and infiltrated about 15 cc of 1% xylocaine in the wound.    I then made an incision into a 12 x 6 cm abscess.  I cleaned the abscess with saline and packed the wound with saline 4 inch Curlex.   Will plan to start dressing changes at the bedside.  Ovidio Kin, MD, [facility] Pager: [phone] Office phone:  [phone]

## 2014-10-21 NOTE — Evaluation (Signed)
Clinical/Bedside Swallow Evaluation Patient Details  Name: Craig [name] MRN: [id number] Date of Birth: 11/05/1945  Today's Date: 10/21/2014 Time: SLP Start Time (ACUTE ONLY): 1355 SLP Stop Time (ACUTE ONLY): 1410 SLP Time Calculation (min) (ACUTE ONLY): 15 min  Past Medical History:  Past Medical History  Diagnosis Date  . COPD (chronic obstructive pulmonary disease) (HCC)   . Hypertension   . Myocardial infarction (HCC)   . A-fib (HCC)   . GERD (gastroesophageal reflux disease)   . Hepatitis B 04/27/2013  . Dysphagia 04/27/2013  . Diverticulosis 03/05/2013  . Sepsis (HCC) 04/26/2013   Past Surgical History:  Past Surgical History  Procedure Laterality Date  . Colostomy    . Peg tube placement    . Peg tube removal     HPI:  Pt is a 69 yo male adm to [facility] with possible aspiration pna.  PMH + for GERD, Hepatitis B, dysphagia s/p PEG placed and removed six months ago, COPD.   Aspiration not excluded  per CT.  Pt had MBS 04/26/2013 showing functional swallow overall - rec dys3/thin.     Assessment / Plan / Recommendation Clinical Impression  Pt slightly reticent to participate in evaluation but was agreeable.  No s/s of aspiration with limited po observed *cracker, icecream, water.  Swallow appeared overall timely with clear voice and no indication of residuals.  Pt does admit to premorbid dysphagia = with symptoms including coughing significantly with intake.  He reports no weight loss since PEG removed approximately six months ago.  Reviewed prior MBS with pt and aspiration precautions.  Recommend advance to regular/thin, education completed. Please reorder if desire.     Aspiration Risk  Mild    Diet Recommendation Age appropriate regular solids;Thin   Medication Administration: Whole meds with liquid Compensations: Slow rate;Small sips/bites    Other  Recommendations Oral Care Recommendations: Oral care BID   Follow Up Recommendations       Frequency and Duration         Pertinent Vitals/Pain Afebrile, decreased      Swallow Study Prior Functional Status   see hhx    General Date of Onset: 10/21/14 Other Pertinent Information: Pt is a 69 yo male adm to [facility] with possible aspiration pna.  PMH + for GERD, Hepatitis B, dysphagia s/p PEG placed and removed six months ago, COPD.   Aspiration not excluded  Type of Study: Bedside swallow evaluation Diet Prior to this Study: Dysphagia 3 (soft);Thin liquids Temperature Spikes Noted: No Respiratory Status: Room air History of Recent Intubation: No Behavior/Cognition: Alert;Cooperative;Pleasant mood Oral Cavity - Dentition: Poor condition Self-Feeding Abilities: Able to feed self Patient Positioning: Upright in bed Baseline Vocal Quality: Normal Volitional Cough: Strong Volitional Swallow: Able to elicit    Oral/Motor/Sensory Function Overall Oral Motor/Sensory Function: Appears within functional limits for tasks assessed   Ice Chips Ice chips: Not tested   Thin Liquid Thin Liquid: Within functional limits Presentation: Straw    Nectar Thick Nectar Thick Liquid: Not tested   Honey Thick Honey Thick Liquid: Not tested   Puree Puree: Within functional limits Presentation: Self Fed;Spoon   Solid   GO    Solid: Within functional limits Presentation: Self Lisabeth Pick, MS [facility] SLP [phone]

## 2014-10-21 NOTE — Consult Note (Signed)
Re:   Craig [name] DOB:   05-18-1945 MRN:   [id number]   [facility] Consultation  ASSESSMENT AND PLAN: 1.  Abdominal wall abscess at old gastrostomy tube site  It appears the G tube came out in the summer of 2015.  Plan I&D at bedside.  Then start dressing changes - will follow  2.  COPD 3.  HTN 4.  CAD  History of MI 5.  Hepatitis B 6.  History of urethral stricture  Dr. Laverle Patter  Had suprapubic bladder cath by Dr. Fredia Sorrow - 08/26/2014 7.  Hypothyroidism 8.  History of depression 9.  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  Poor dentition 11.  Bed ridden 12.  Chronic anemia - Hgb 7.6 - 10/21/2014 13.  Malnourished - Alb 2.7 - 10/21/2014  Will check prealbumin during hospitalization.  Chief Complaint  Patient presents with  . Abscess   REFERRING PHYSICIAN:  Elpidio Anis, PA, Janyce Llanos  HISTORY OF PRESENT ILLNESS: Craig [name] is a 69 y.o. (DOB: June 16, 1945)  AA  male whose primary care physician is Kirt Boys, DO and comes to the [facility] today for abdominal abscess.  He is at [facility].   The patient, though alert and can converse, is a poor historian.  Much of this history is from what I can piece together by looking at his chart.  Mr. Causer had a gastrostomy tube in place till the summer of 2015.  The tube came out and was not replaced.  The reason for the g tube is unclear.  He was at [facility] for respiratory failure in 2014 and transferred to [facility] in Jan 2015.  It looks like he was transferred to [facility] in Feb 2015 from [facility].   CT scan of abdomen - 10/21/2014 -   1. Focal collection of fluid and trace air at the anterior left mid abdominal wall, measuring 8.1 x 2.1 x 5.3 cm, with peripheral enhancement, compatible with an abscess. This appears to be at the prior site of a G-tube placement, as the stomach  is tamped up to the abdominal wall underlying the abscess. This may still be contiguous with the stomach.  2. Prominent bronchiectasis at the lower lung lobes, with patchy bibasilar airspace opacities. This may reflect an atypical infectious process. Aspiration cannot be excluded. Trace right pleural effusion seen.   3. Left lower quadrant colostomy site is unremarkable in appearance. Hartmann's pouch is within normal limits. 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 Mild diverticulosis along the distal transverse, descending and proximal sigmoid colon, without evidence of diverticulitis.  4. Small bilateral renal cysts seen.  5. Diffuse calcification along the abdominal aorta and its branches.  6. 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    Past Medical History  Diagnosis Date  . COPD (chronic obstructive pulmonary disease) (HCC)   . Hypertension   . Myocardial infarction (HCC)   . A-fib (HCC)   . GERD (gastroesophageal reflux disease)   . Hepatitis B 04/27/2013  . Dysphagia 04/27/2013  . Diverticulosis 03/05/2013  . Sepsis (HCC) 04/26/2013      Past Surgical History  Procedure Laterality Date  . Colostomy    . Peg tube placement    .  Peg tube removal        Current Facility-Administered Medications  Medication Dose Route Frequency Provider Last Rate Last Dose  . piperacillin-tazobactam (ZOSYN) IVPB 3.375 g  3.375 g Intravenous Once Elpidio Anis, PA-C 100 mL/hr at 10/21/14 0659 3.375 g at 10/21/14 0659  . vancomycin (VANCOCIN) IVPB 1000 mg/200 mL premix  1,000 mg Intravenous Once Elpidio Anis, PA-C       Current Outpatient Prescriptions  Medication Sig Dispense Refill  . amiodarone (PACERONE) 200 MG tablet Take 200 mg by mouth daily.    Marland Kitchen aspirin EC 81 MG tablet Take 81 mg by mouth daily.    Marland Kitchen Dextrose-Sodium Chloride (DEXTROSE 5 % AND  0.45% NACL) 5-0.45 % Inject 1,000 mLs into the vein 3 (three) times daily.    Marland Kitchen doxycycline (DORYX) 100 MG EC tablet Take 100 mg by mouth 2 (two) times daily.    Marland Kitchen escitalopram (LEXAPRO) 10 MG tablet Take 10 mg by mouth daily.    Marland Kitchen FLORA-Q (FLORA-Q) CAPS capsule Take 1 capsule by mouth daily.    . Fluticasone Furoate-Vilanterol (BREO ELLIPTA) 100-25 MCG/INH AEPB Inhale 1 puff into the lungs daily.     . folic acid (FOLVITE) 1 MG tablet Take 1 mg by mouth daily.    . K Phos Mono-Sod Phos Di & Mono (PHOSPHA 250 NEUTRAL PO) Take 250 mg by mouth 2 (two) times daily.     . lansoprazole (PREVACID SOLUTAB) 30 MG disintegrating tablet Take 30 mg by mouth daily.    Marland Kitchen levothyroxine (SYNTHROID, LEVOTHROID) 100 MCG tablet Take 100 mcg by mouth daily before breakfast.    . morphine 20 MG/5ML solution Take 5 mg by mouth every 6 (six) hours as needed for pain.     . Multiple Vitamins-Minerals (DECUBI-VITE PO) Take 1 capsule by mouth daily.    Marland Kitchen thiamine 100 MG tablet Take 100 mg by mouth daily.    . cephALEXin (KEFLEX) 500 MG capsule Take 1 capsule (500 mg total) by mouth 4 (four) times daily. (Patient not taking: Reported on 10/21/2014) 40 capsule 0  . cetirizine (ZYRTEC) 10 MG tablet Take 10 mg by mouth daily as needed for allergies.     . hydrOXYzine (ATARAX/VISTARIL) 25 MG tablet Take 25 mg by mouth every 6 (six) hours as needed for itching.    . Melatonin 3 MG TABS Take 1 tablet by mouth at bedtime as needed (sleep).     . promethazine (PHENERGAN) 25 MG tablet Take 25 mg by mouth every 6 (six) hours as needed for nausea or vomiting.       No Known Allergies  REVIEW OF SYSTEMS: Skin:  History of sacral decubiti Infection:  History of Hep B and decubiti. Neurologic:  No history of stroke.  No history of seizure.  No history of headaches. Cardiac:  History of A fib Pulmonary:  History of COPD  Endocrine:  No diabetes. No thyroid disease. Gastrointestinal:  Colostomy for diversion of fecal stream ?    Malnourished - chronic Urologic:  Suprapubic foley - seen by Dr. Laverle Patter in the past Musculoskeletal:  Bed ridden.  Contractures at ankles Hematologic:  Anemia.  Hgb 7.6 on admission - about the same one month ago Psycho-social:  The patient is oriented.   The patient has no obvious psychologic or social impairment to understanding our conversation and plan.  SOCIAL and FAMILY HISTORY: Unmarried. No children. Mother and sister are in [place] ?  PHYSICAL EXAM: BP 124/45 mmHg  Pulse 87  Temp(Src) 98  F (36.7 C) (Oral)  Resp 18  SpO2 93%  General: Thin older AA M who is alert. HEENT: Normal. Pupils equal.  Poor dentition Neck: Supple. No mass.  No thyroid mass. Lymph Nodes:  No supraclavicular or cervical nodes. Lungs: Symmetric breath sounds.  Minimal rhonchi both bases Heart:  RRR. No murmur or rub. Abdomen: Soft. LLQ ostomy.  Abscess left mid/upper abdomen (just above ostomy appliance).  There are two holes about 10 cm apart that I plan to connect with bedside I&D Rectal: Not done. Extremities:  Both lower extremities atrophic with contractures at ankles. Neurologic:  Grossly intact to motor and sensory function. Psychiatric: Has normal mood and affect. Behavior is normal.   DATA REVIEWED: Epic notes  Ovidio Kin, MD,  [facility] [address]  [address]   [place], [place]    [address] Phone:  [phone] FAX:  [phone]

## 2014-10-21 NOTE — ED Provider Notes (Addendum)
CSN: [id number]     Arrival date & time 10/21/14  0354 History   First MD Initiated Contact with Patient 10/21/14 0400     Chief Complaint  Patient presents with  . Abscess     (Consider location/radiation/quality/duration/timing/severity/associated sxs/prior Treatment) Patient is a 69 y.o. male presenting with abscess. The history is provided by the patient, the EMS personnel and the nursing home. No language interpreter was used.  Abscess Location:  Torso Torso abscess location:  Abd LUQ Abscess quality: not draining   Red streaking: no   Duration:  2 days Progression:  Worsening Associated symptoms: no fever   Associated symptoms comment:  Patient arrives from nursing home with complaint of abscess to abdominal wall that requires evaluation and management. The patient complains of pain at the site and surrounding areas. No fever, vomiting. The site does not drain. There is a nearby colostomy in the LLQ that the patient reports is filled with non-bloody, non-melanic stool as per usual habit.    Past Medical History  Diagnosis Date  . COPD (chronic obstructive pulmonary disease) (HCC)   . Hypertension   . Myocardial infarction (HCC)   . A-fib (HCC)   . GERD (gastroesophageal reflux disease)   . Hepatitis B 04/27/2013  . Dysphagia 04/27/2013  . Diverticulosis 03/05/2013  . Sepsis (HCC) 04/26/2013   Past Surgical History  Procedure Laterality Date  . Colostomy    . Peg tube placement    . Peg tube removal     History reviewed. No pertinent family history. Social History  Substance Use Topics  . Smoking status: Former Games developer  . Smokeless tobacco: None  . Alcohol Use: No    Review of Systems  Constitutional: Negative for fever and chills.  Respiratory: Negative.   Cardiovascular: Negative.   Gastrointestinal: Positive for abdominal pain. Negative for blood in stool.  Musculoskeletal: Negative.   Skin:       Abscess.  Neurological: Negative.       Allergies    Review of patient's allergies indicates no known allergies.  Home Medications   Prior to Admission medications   Medication Sig Start Date End Date Taking? Authorizing Provider  amiodarone (PACERONE) 200 MG tablet Take 200 mg by mouth daily.   Yes Historical Provider, MD  aspirin EC 81 MG tablet Take 81 mg by mouth daily.   Yes Historical Provider, MD  Dextrose-Sodium Chloride (DEXTROSE 5 % AND 0.45% NACL) 5-0.45 % Inject 1,000 mLs into the vein 3 (three) times daily.   Yes Historical Provider, MD  doxycycline (DORYX) 100 MG EC tablet Take 100 mg by mouth 2 (two) times daily.   Yes Historical Provider, MD  escitalopram (LEXAPRO) 10 MG tablet Take 10 mg by mouth daily.   Yes Historical Provider, MD  FLORA-Q [facility]) CAPS capsule Take 1 capsule by mouth daily.   Yes Historical Provider, MD  Fluticasone Furoate-Vilanterol (BREO ELLIPTA) 100-25 MCG/INH AEPB Inhale 1 puff into the lungs daily.    Yes Historical Provider, MD  folic acid (FOLVITE) 1 MG tablet Take 1 mg by mouth daily.   Yes Historical Provider, MD  K Phos Mono-Sod Phos Di & Mono (PHOSPHA 250 NEUTRAL PO) Take 250 mg by mouth 2 (two) times daily.    Yes Historical Provider, MD  lansoprazole (PREVACID SOLUTAB) 30 MG disintegrating tablet Take 30 mg by mouth daily.   Yes Historical Provider, MD  levothyroxine (SYNTHROID, LEVOTHROID) 100 MCG tablet Take 100 mcg by mouth daily before breakfast.   Yes Historical  Provider, MD  morphine 20 MG/5ML solution Take 5 mg by mouth every 6 (six) hours as needed for pain.    Yes Historical Provider, MD  Multiple Vitamins-Minerals (DECUBI-VITE PO) Take 1 capsule by mouth daily.   Yes Historical Provider, MD  thiamine 100 MG tablet Take 100 mg by mouth daily.   Yes Historical Provider, MD  cephALEXin (KEFLEX) 500 MG capsule Take 1 capsule (500 mg total) by mouth 4 (four) times daily. Patient not taking: Reported on 10/21/2014 08/26/14   Joycie Peek, PA-C  cetirizine (ZYRTEC) 10 MG tablet Take 10 mg  by mouth daily as needed for allergies.     Historical Provider, MD  hydrOXYzine (ATARAX/VISTARIL) 25 MG tablet Take 25 mg by mouth every 6 (six) hours as needed for itching.    Historical Provider, MD  Melatonin 3 MG TABS Take 1 tablet by mouth at bedtime as needed (sleep).     Historical Provider, MD  promethazine (PHENERGAN) 25 MG tablet Take 25 mg by mouth every 6 (six) hours as needed for nausea or vomiting.    Historical Provider, MD   BP 133/54 mmHg  Pulse 91  Temp(Src) 98 F (36.7 C) (Oral)  Resp 18  SpO2 92% Physical Exam  Constitutional: He appears well-developed and well-nourished.  HENT:  Head: Normocephalic.  Neck: Normal range of motion. Neck supple.  Cardiovascular: Normal rate and regular rhythm.   Pulmonary/Chest: Effort normal and breath sounds normal.  Abdominal: Soft. There is no tenderness. There is no rebound and no guarding.    Hypoactive BS's. Soft abdomen. There is a large blister (bulla) on left abdominal wall superior to colostomy site with surrounding redness. The entire area is tender. There is soft, brown stool in colostomy bag. Remainder of abdomen is non-tender.   Musculoskeletal: Normal range of motion.  Neurological: He is alert. No cranial nerve deficit.  Skin: Skin is warm and dry. No rash noted.  Psychiatric: He has a normal mood and affect.    ED Course  Procedures (including critical care time) Labs Review Labs Reviewed  CBC WITH DIFFERENTIAL/PLATELET - Abnormal; Notable for the following:    WBC 15.7 (*)    RBC 3.74 (*)    Hemoglobin 7.6 (*)    HCT 25.4 (*)    MCV 67.9 (*)    MCH 20.3 (*)    MCHC 29.9 (*)    RDW 18.8 (*)    Platelets 402 (*)    Neutro Abs 11.6 (*)    Monocytes Absolute 1.3 (*)    All other components within normal limits  COMPREHENSIVE METABOLIC PANEL    Imaging Review No results found. I have personally reviewed and evaluated these images and lab results as part of my medical decision-making.   EKG  Interpretation None      MDM   Final diagnoses:  None    1. Intra-Abdominal abscess 2. Aspiration pneumonia 3. Anemia, at baseline  Patient has been comfortable in the ED, without complaint of pain. CT scan abd/pel ordered to evaluate for intra-abdominal vs cutaneous abscess and shows large air-containing abscess measuring 8 cm x 5 cm x 2 cm at the site of previous G-tube that is in communication with the stomach. Also, finding of patchy basilar infiltrates c/w ? Aspiration PNA. He has been slightly hypoxic in the ED which is c/w PNA. He is no respiratory distress.   Discussed with Dr. Lovell Sheehan (triad) wo accepts for admission. Discussed with Dr. Ezzard Standing (general surgery) who will provide consultation for  abscess.    Elpidio Anis, PA-C 10/21/14 4098  April Palumbo, MD 10/21/14 0549  Elpidio Anis, PA-C 10/21/14 1191  April Palumbo, MD 10/21/14 [phone]

## 2014-10-21 NOTE — Progress Notes (Signed)
Pt refuses blood states he feels fine. MD notified.

## 2014-10-21 NOTE — Progress Notes (Signed)
Dressing to abdomen reinforced. Packing not removed due to bleeding at some areas. Will continue to monitor.

## 2014-10-21 NOTE — Progress Notes (Signed)
Received called from lab about pt HGB. Abdominal site re-asessed. No bleeding noted on dressing. Dr. Gwenlyn Perking notified.

## 2014-10-21 NOTE — ED Notes (Signed)
Brought in by [facility] from [facility] (off [address] with c/o abdominal abscess.  Per [facility], staff at the facility reported that pt has "abdominal abscess" and has had a fever of T99.0;  MD at the facility ordered "IVF of NS 3 liters"----- IV access was "lost" and staff at the facility attempted to re-insert PIV line without success; pt only received a total of "400 ml" of IVF.  Staff called on-call MD and reported that they were not able to re-insert PIV.  On-call MD questioned the order of "3 liters" because pt has hx of CHF---- MD ordered staff to send pt to ED for evaluation and treatment.  Pt is currently on Doxycycline 100 mg 1 tablet po BID x 2 weeks (started yesterday, Friday).

## 2014-10-21 NOTE — Progress Notes (Signed)
Pt was admitted this morning for pneumonia, low grade fevers and intraabdominal abscess that burst on arrival to unit this morning. Dr. Ezzard Standing of surgery made an incision to left abdomen, drain abscess. Irrigated it and packed it with wet kerlix and covered it with pressure dressing. Pt tolerated procedure well. Pt has no other complaints. Does have a productive cough, yellow thick sputum. Pt has a supra pubic cath that was changed a month ago, and a colostomy. Pt states he walks sometimes but based on the condition of his feet, that remains questionable. Pt has a healed sacral decubitus. Allevyn was placed on admission to unit in addition to one on his right heal.

## 2014-10-22 DIAGNOSIS — I480 Paroxysmal atrial fibrillation: Secondary | ICD-10-CM

## 2014-10-22 DIAGNOSIS — R1310 Dysphagia, unspecified: Secondary | ICD-10-CM

## 2014-10-22 LAB — CBC
HEMATOCRIT: 23.1 % — AB (ref 39.0–52.0)
HEMOGLOBIN: 7 g/dL — AB (ref 13.0–17.0)
MCH: 20.5 pg — ABNORMAL LOW (ref 26.0–34.0)
MCHC: 30.3 g/dL (ref 30.0–36.0)
MCV: 67.7 fL — ABNORMAL LOW (ref 78.0–100.0)
Platelets: 365 10*3/uL (ref 150–400)
RBC: 3.41 MIL/uL — ABNORMAL LOW (ref 4.22–5.81)
RDW: 18.9 % — ABNORMAL HIGH (ref 11.5–15.5)
WBC: 8.2 10*3/uL (ref 4.0–10.5)

## 2014-10-22 LAB — STREP PNEUMONIAE URINARY ANTIGEN: Strep Pneumo Urinary Antigen: NEGATIVE

## 2014-10-22 LAB — BASIC METABOLIC PANEL
Anion gap: 5 (ref 5–15)
BUN: 11 mg/dL (ref 6–20)
CO2: 32 mmol/L (ref 22–32)
Calcium: 8.7 mg/dL — ABNORMAL LOW (ref 8.9–10.3)
Chloride: 102 mmol/L (ref 101–111)
Creatinine, Ser: 1.06 mg/dL (ref 0.61–1.24)
GFR calc Af Amer: >60 mL/min (ref >60)
GFR calc non Af Amer: >60 mL/min (ref >60)
Glucose, Bld: 83 mg/dL (ref 65–99)
Potassium: 3.6 mmol/L (ref 3.5–5.1)
Sodium: 139 mmol/L (ref 135–145)

## 2014-10-22 LAB — HIV ANTIBODY (ROUTINE TESTING W REFLEX): HIV Screen 4th Generation wRfx: NONREACTIVE

## 2014-10-22 LAB — TSH: TSH: 0.088 u[IU]/mL — ABNORMAL LOW (ref 0.350–4.500)

## 2014-10-22 MED ORDER — CHLORHEXIDINE GLUCONATE CLOTH 2 % EX PADS
6.0000 | MEDICATED_PAD | Freq: Every day | CUTANEOUS | Status: DC
  Administered 2014-10-22: 6 via TOPICAL

## 2014-10-22 MED ORDER — LEVOTHYROXINE SODIUM 88 MCG PO TABS
88.0000 ug | ORAL_TABLET | Freq: Every day | ORAL | Status: DC
  Administered 2014-10-23: 88 ug via ORAL
  Filled 2014-10-22: qty 1

## 2014-10-22 MED ORDER — MUPIROCIN 2 % EX OINT
TOPICAL_OINTMENT | Freq: Two times a day (BID) | CUTANEOUS | Status: DC
  Administered 2014-10-22 – 2014-10-23 (×3): via NASAL

## 2014-10-22 MED ORDER — FUROSEMIDE 10 MG/ML IJ SOLN
20.0000 mg | Freq: Once | INTRAMUSCULAR | Status: AC
  Administered 2014-10-22: 20 mg via INTRAVENOUS
  Filled 2014-10-22: qty 2

## 2014-10-22 NOTE — Progress Notes (Addendum)
[facility] PROGRESS NOTE  Craig [name] WUJ:[id number] DOB: [birth year] DOA: 10/21/2014 PCP: Kirt Boys, DO  Assessment/Plan: 1-abd abscess: patient hx of gastrostomy tube, no discontinue for over a year and is presenting with left side abd abscess -afebrile, WBC's trending down appropriately -will follow cx's -CCS consulted and has performed I&D at bedside (some bleeding/purulent discharge appreciated) -will continue current ab'x (vanc and zosyn) -will monitor VS and continue supportive care  2-Presumed PNA: seen on CT abd and pelvis; with concerns for Aspiration vs HCAP -will cover with zosyn and vancomycin -flutter valve -nebulizer treatment -will continue chronic oxygen supplementation -follow PNA protocol orders -doing good, WBC's trending down and no fever -breathing stable  3-Hypothyroidism:  -TSH low -will continue synthroid and adjust dose to daily  4-Depression: no SI or hallucinations -positive flat affect on exam -will continue home medication regimen  5-anemia or chronic disease: with acute blood loss component from I&D -Hgb down to 6.8-7.0 -will transfuse 2 units -will follow Hgb trend -patient initially refuse transfusion, but is now in agreement   6-A-fib [facility]): not on anticoagulation at home -will continue rate control and monitor on telemetry  7-chronic resp failure due to COPD (chronic obstructive pulmonary disease) (HCC): patient currently is not wheezing -will continue nebulizer treatment PRN -continue home inhaler regimen -continue Oxygen supplementation (chronically use 2L oxygen supplementation)  8-Dysphagia:  -evaluated by SPL and no frank signs of aspiration appreciated -will follow rec's and use regular consistency diet with thin liquids  9-Leukocytosis: due to # 1 and 2 -will continue current antibiotics regimen -will follow WBC's trend  10-Protein-calorie malnutrition, severe (HCC): nutritional consult in place to  assist with recommendation on feeding supplementations  11-GERD without esophagitis: will continue PPI  12-Encounter for palliative care: patient with poor quality of life; essentially bed bound and with concerns for Asp PNA. -will ask for palliative consult for GOC and advance directives   13-hx of diverting colostomy: to assist healing decubitus ulcer -unclear when, where or by whom this procedure was done -overall colostomy appears stable. -stool seen inside colostomy bag  Code Status: Full  Family Communication: no family at bedside Disposition Plan: back to facility when medically stable   Consultants:  CCS  Procedures:  I&D at bedside on 10/08  Antibiotics:  Zosyn   Vancomycin   HPI/Subjective: Afebrile, abd pain well controlled, no CP.  Objective: Filed Vitals:   10/22/14 0955  BP: 121/48  Pulse:   Temp: 97.8 F (36.6 C)  Resp: 20    Intake/Output Summary (Last 24 hours) at 10/22/14 1155 Last data filed at 10/22/14 0440  Gross per 24 hour  Intake   1250 ml  Output   2475 ml  Net  -1225 ml   Filed Weights   10/22/14 0500  Weight: 73.165 kg (161 lb 4.8 oz)    Exam:   General:  Feeling better, breathing comfortable and is afebrile, reports some pain around woun in his stomach but well controlled.  Cardiovascular: S1 and S2, no rubs or gallops  Respiratory: no wheezing, no rales, positive rhonchi  Abdomen: soft, no guarding, patient with clean dressings in the area of abd abscess, reports pain is well controlled. Colostomy in place and no signs of infections appreciated around ostomy  Musculoskeletal: no edema, no cyanosis   Data Reviewed: Basic Metabolic Panel:  Recent Labs Lab 10/21/14 0439 10/21/14 1343 10/22/14 0450  NA 135  --  139  K 3.8  --  3.6  CL 98*  --  102  CO2 29  --  32  GLUCOSE 87  --  83  BUN 19  --  11  CREATININE 1.14 1.17 1.06  CALCIUM 8.8*  --  8.7*  MG  --  1.5*  --    Liver Function Tests:  Recent  Labs Lab 10/21/14 0439 10/21/14 1343  AST 14* 10*  ALT 9* 8*  ALKPHOS 65 56  BILITOT 0.5 0.5  PROT 8.0 7.1  ALBUMIN 2.7* 2.3*   CBC:  Recent Labs Lab 10/21/14 0439 10/21/14 1343 10/22/14 0450  WBC 15.7* 12.1* 8.2  NEUTROABS 11.6*  --   --   HGB 7.6* 6.8* 7.0*  HCT 25.4* 22.9* 23.1*  MCV 67.9* 67.8* 67.7*  PLT 402* 318 365   CBG: No results for input(s): GLUCAP in the last 168 hours.  Recent Results (from the past 240 hour(s))  Blood culture (routine x 2)     Status: None (Preliminary result)   Collection Time: 10/21/14  6:53 AM  Result Value Ref Range Status   Specimen Description BLOOD BLOOD LEFT FOREARM  Final   Special Requests BOTTLES DRAWN AEROBIC AND ANAEROBIC 5CC  Final   Culture   Final    NO GROWTH 1 DAY Performed at [facility]    Report Status PENDING  Incomplete  Blood culture (routine x 2)     Status: None (Preliminary result)   Collection Time: 10/21/14  6:53 AM  Result Value Ref Range Status   Specimen Description BLOOD RIGHT ANTECUBITAL  Final   Special Requests BOTTLES DRAWN AEROBIC AND ANAEROBIC 10cc  Final   Culture   Final    NO GROWTH 1 DAY Performed at [facility]    Report Status PENDING  Incomplete  Culture, sputum-assessment     Status: None   Collection Time: 10/21/14  2:20 PM  Result Value Ref Range Status   Specimen Description SPUTUM  Final   Special Requests NONE  Final   Sputum evaluation   Final    THIS SPECIMEN IS ACCEPTABLE. RESPIRATORY CULTURE REPORT TO FOLLOW.   Report Status 10/21/2014 FINAL  Final  MRSA PCR Screening     Status: Abnormal   Collection Time: 10/21/14  9:13 PM  Result Value Ref Range Status   MRSA by PCR POSITIVE (A) NEGATIVE Final    Comment:        The GeneXpert MRSA Assay (FDA approved for NASAL specimens only), is one component of a comprehensive MRSA colonization surveillance program. It is not intended to diagnose MRSA infection nor to guide or monitor treatment for MRSA  infections. RESULT CALLED TO, READ BACK BY AND VERIFIED WITH: [name],L RN [phone] COVINGTON,N      Studies: Ct Abdomen Pelvis W Contrast  10/21/2014   CLINICAL DATA:  Leukocytosis. Evaluate known abdominal abscess. Initial encounter.  EXAM: CT ABDOMEN AND PELVIS WITH CONTRAST  TECHNIQUE: Multidetector CT imaging of the abdomen and pelvis was performed using the standard protocol following bolus administration of intravenous contrast.  CONTRAST:  OMNIPAQUE IOHEXOL 300 MG/ML  SOLN  COMPARISON:  None.  FINDINGS: Prominent bronchiectasis is noted at the lower lung lobes, with patchy bibasilar airspace opacities. This may reflect an atypical infectious process. Aspiration cannot be excluded. A trace right pleural effusion is seen.  There is a focal collection of fluid and trace air at the anterior left mid abdominal wall, measuring approximately 8.1 x 2.1 x 5.3 cm, with peripheral enhancement. This appears to reflect abscess at the prior  site of a G-tube placement, as the stomach is tamped up to the abdominal wall underlying the abscess. This may still be contiguous with the stomach.  The liver and spleen are unremarkable in appearance. The gallbladder is within normal limits. The pancreas and adrenal glands are unremarkable.  A 2.5 cm cyst is noted at the interpole region of the right kidney. A smaller 0.8 cm left renal cyst is noted. There is no evidence of hydronephrosis. No renal or ureteral stones are seen. No perinephric stranding is appreciated.  No free fluid is identified. The small bowel is unremarkable in appearance. The stomach is within normal limits. No acute vascular abnormalities are seen. Diffuse calcification is noted along the abdominal aorta and its branches.  The appendix is normal in caliber, without evidence of appendicitis. Mild diverticulosis is noted along the distal transverse, descending and proximal sigmoid colon, without evidence of diverticulitis.  The patient's left lower  quadrant colostomy site is unremarkable in appearance. The Hartmann's pouch is within normal limits. 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  The bladder is decompressed, with a suprapubic catheter in place. The prostate remains normal in size. No inguinal lymphadenopathy is seen.  No acute osseous abnormalities are identified. 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  IMPRESSION: 1. Focal collection of fluid and trace air at the anterior left mid abdominal wall, measuring 8.1 x 2.1 x 5.3 cm, with peripheral enhancement, compatible with an abscess. This appears to be at the prior site of a G-tube placement, as the stomach is tamped up to the abdominal wall underlying the abscess. This may still be contiguous with the stomach. 2. Prominent bronchiectasis at the lower lung lobes, with patchy bibasilar airspace opacities. This may reflect an atypical infectious process. Aspiration cannot be excluded. Trace right pleural effusion seen. 3. Left lower quadrant colostomy site is unremarkable in appearance. Hartmann's pouch is within normal limits. 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 Mild diverticulosis along the distal transverse, descending and proximal sigmoid colon, without evidence of diverticulitis. 4. Small bilateral renal cysts seen. 5. Diffuse calcification along the abdominal aorta and its branches. 6. 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 These results were called by telephone at the time of interpretation on 10/21/2014 at 6:27 am to [facility] UPSTILL PA, who verbally acknowledged these results.   Electronically Signed   By: Roanna Raider  M.D.   On: 10/21/2014 06:27    Scheduled Meds: . sodium chloride   Intravenous Once  . acidophilus  1 capsule Oral Daily  . amiodarone  200 mg Oral Daily  . aspirin EC  81 mg Oral Daily  . Chlorhexidine Gluconate Cloth  6 each Topical Q0600  . Chlorhexidine Gluconate Cloth  6 each Topical Q0600  . escitalopram  10 mg Oral Daily  . Fluticasone Furoate-Vilanterol  1 puff Inhalation Daily  . folic acid  1 mg Oral Daily  . furosemide  20 mg Intravenous Once  . furosemide  20 mg Intravenous Once  . Influenza vac split quadrivalent PF  0.5 mL Intramuscular Tomorrow-1000  . levothyroxine  100 mcg Oral QAC breakfast  . loratadine  10 mg Oral Daily  .  mupirocin ointment  1 application Nasal BID  . mupirocin ointment   Nasal BID  . pantoprazole  40 mg Oral Daily  . phosphorus  250 mg Oral BID  . piperacillin-tazobactam (ZOSYN)  IV  3.375 g Intravenous 3 times per day  . sodium chloride  3 mL Intravenous Q12H  . thiamine  100 mg Oral Daily  . vancomycin  1,000 mg Intravenous Q12H   Continuous Infusions:   Active Problems:   Hypothyroidism   Depression   A-fib (HCC)   COPD (chronic obstructive pulmonary disease) (HCC)   Dysphagia   Aspiration pneumonia (HCC)   HCAP (healthcare-associated pneumonia)   Abdominal abscess (HCC)   Leukocytosis   Protein-calorie malnutrition, severe (HCC)   Anemia of chronic disease   GERD without esophagitis   Encounter for palliative care    Time spent: 30 minutes    Vassie Loll  [facility] Pager [phone]. If 7PM-7AM, please contact night-coverage at [email or web address], password [facility] 10/22/2014, 11:55 AM  LOS: 1 day

## 2014-10-22 NOTE — Consult Note (Signed)
Consultation Note Date: 10/22/2014   Patient Name: Craig [name]  DOB: 04/14/1945  MRN: [id number]  Age / Sex: 69 y.o., male   PCP: Kirt Boys, DO Referring Physician: Vassie Loll, MD  Reason for Consultation: Establishing goals of care  Palliative Care Assessment and Plan Summary of Established Goals of Care and Medical Treatment Preferences  History of presenting illness: Craig [name] is a 69 y.o. male with PMH significant for GERD, chronic resp failure on Oxygen due to COPD, hx of Atrial Fib, HTN, chronic anemia, FTT, hx of diverting colostomy and dysphagia; who came from nursing home due to left abd pain and swelling in this area. Patient denies fever, chills, CP, nausea, vomiting, SOB or any other complaints. He reported swelling, pain and warm sensation on his abd has been going for the last 2 days or so. Patient in ED had CT abd that demonstrated abscess in this area and also presumed bilateral lower lobes infiltrates (with concerns for PNA, most likely aspiration, base on location). WBC's were elevated and the patient was referred for admission. [facility] was consulted, but giving magnitude of medical problems and cormobidities [facility] was ask to admit him.  A palliative consult has been requested in light of the patient's multiple acute and chronic co morbidities, acute infection from abdominal wall abscess and PNA, nursing home status and ongoing gradual progressive decline.   The patient is resting in bed, he is awake, alert. He doesn't verbalize much. He mostly answers in mono syllables, yes/no. He is in no distress. There is no family at the bedside. The patient lives in [place]. The patient has a friend Erland Vivas who he has chosen as his health care representative. He has a sister who lives in [place] her name is [place] May [phone].   Discussed code status with the patient, he states, 'ain't much you can do for me if it comes to that" he does not believe he ought to  undergo CPR or be on machines. Call placed and discussed this with both Mr Effie Shy and his sister [place] May after I received consent from the patient.   PLAN: Family meeting with patient, his friend Deddrick Saindon 10-23-14 at 10 AM. We will discuss code status and further goals of care in detail Additional recommendations will follow Thank you for the consult.   Contacts/Participants in Discussion: Primary Decision Maker:  Patient, then he designates his friend Bruce Churilla who is also listed as his emergency contact.    HCPOA: no     Code Status/Advance Care Planning:  Full code for now, but patient is leaning towards DNR DNI. Family meeting with patient and his chosen surrogate decision maker friend Jamarr Treinen scheduled for 10-23-14 at 10 AM in the patient's room. Mr Effie Shy is not available to come to the  Hospital today.   Symptom Management:   Continue current measures, no acute symptoms.   Palliative Prophylaxis: yes   Additional Recommendations (Limitations, Scope, Preferences):  Family meeting to discuss code status and goals of care on 10-23-14 at 10 AM Psycho-social/Spiritual:   Support System: has a friend Kathlen Brunswick. Has a sister who lives in [place], has a mother who is in a nursing facility in [place]  Desire for further Chaplaincy support:no  Prognosis: < 6 months possibly  Discharge Planning:  facility golden living, possibly with hospice support     Values: to be explored in detail in family meeting.  Life limiting illness: abdominal abscess, aspiration PNA, underlying GERD, chronic  resp failure on Oxygen due to COPD, hx of Atrial Fib, HTN, chronic anemia, FTT, hx of diverting colostomy and dysphagia; who came from nursing home       Chief Complaint/History of Present Illness: abdominal pain   Primary Diagnoses  Present on Admission:  . Aspiration pneumonia (HCC) . HCAP (healthcare-associated pneumonia) . Abdominal abscess (HCC) .  Leukocytosis . COPD (chronic obstructive pulmonary disease) (HCC) . Hypothyroidism . Protein-calorie malnutrition, severe (HCC) . Anemia of chronic disease . Depression . A-fib (HCC) . GERD without esophagitis  Palliative Review of Systems:  completed  I have reviewed the medical record, interviewed the patient and family, and examined the patient. The following aspects are pertinent.  Past Medical History  Diagnosis Date  . COPD (chronic obstructive pulmonary disease) (HCC)   . Hypertension   . Myocardial infarction (HCC)   . A-fib (HCC)   . GERD (gastroesophageal reflux disease)   . Hepatitis B 04/27/2013  . Dysphagia 04/27/2013  . Diverticulosis 03/05/2013  . Sepsis (HCC) 04/26/2013   Social History   Social History  . Marital Status: Unknown    Spouse Name: N/A  . Number of Children: N/A  . Years of Education: N/A   Social History Main Topics  . Smoking status: Former Games developer  . Smokeless tobacco: None  . Alcohol Use: No  . Drug Use: No  . Sexual Activity: Not Currently   Other Topics Concern  . None   Social History Narrative   History reviewed. No pertinent family history. Scheduled Meds: . sodium chloride   Intravenous Once  . acidophilus  1 capsule Oral Daily  . amiodarone  200 mg Oral Daily  . aspirin EC  81 mg Oral Daily  . Chlorhexidine Gluconate Cloth  6 each Topical Q0600  . Chlorhexidine Gluconate Cloth  6 each Topical Q0600  . escitalopram  10 mg Oral Daily  . Fluticasone Furoate-Vilanterol  1 puff Inhalation Daily  . folic acid  1 mg Oral Daily  . furosemide  20 mg Intravenous Once  . furosemide  20 mg Intravenous Once  . Influenza vac split quadrivalent PF  0.5 mL Intramuscular Tomorrow-1000  . levothyroxine  100 mcg Oral QAC breakfast  . loratadine  10 mg Oral Daily  . mupirocin ointment  1 application Nasal BID  . mupirocin ointment   Nasal BID  . pantoprazole  40 mg Oral Daily  . phosphorus  250 mg Oral BID  . piperacillin-tazobactam  (ZOSYN)  IV  3.375 g Intravenous 3 times per day  . sodium chloride  3 mL Intravenous Q12H  . thiamine  100 mg Oral Daily  . vancomycin  1,000 mg Intravenous Q12H   Continuous Infusions:  PRN Meds:.acetaminophen **OR** acetaminophen, hydrOXYzine, ipratropium-albuterol, morphine injection, morphine CONCENTRATE, ondansetron **OR** ondansetron (ZOFRAN) IV Medications Prior to Admission:  Prior to Admission medications   Medication Sig Start Date End Date Taking? Authorizing Provider  amiodarone (PACERONE) 200 MG tablet Take 200 mg by mouth daily.   Yes Historical Provider, MD  aspirin EC 81 MG tablet Take 81 mg by mouth daily.   Yes Historical Provider, MD  Dextrose-Sodium Chloride (DEXTROSE 5 % AND 0.45% NACL) 5-0.45 % Inject 1,000 mLs into the vein 3 (three) times daily.   Yes Historical Provider, MD  doxycycline (DORYX) 100 MG EC tablet Take 100 mg by mouth 2 (two) times daily.   Yes Historical Provider, MD  escitalopram (LEXAPRO) 10 MG tablet Take 10 mg by mouth daily.   Yes Historical Provider,  MD  FLORA-Q (FLORA-Q) CAPS capsule Take 1 capsule by mouth daily.   Yes Historical Provider, MD  Fluticasone Furoate-Vilanterol (BREO ELLIPTA) 100-25 MCG/INH AEPB Inhale 1 puff into the lungs daily.    Yes Historical Provider, MD  folic acid (FOLVITE) 1 MG tablet Take 1 mg by mouth daily.   Yes Historical Provider, MD  K Phos Mono-Sod Phos Di & Mono (PHOSPHA 250 NEUTRAL PO) Take 250 mg by mouth 2 (two) times daily.    Yes Historical Provider, MD  lansoprazole (PREVACID SOLUTAB) 30 MG disintegrating tablet Take 30 mg by mouth daily.   Yes Historical Provider, MD  levothyroxine (SYNTHROID, LEVOTHROID) 100 MCG tablet Take 100 mcg by mouth daily before breakfast.   Yes Historical Provider, MD  morphine 20 MG/5ML solution Take 5 mg by mouth every 6 (six) hours as needed for pain.    Yes Historical Provider, MD  Multiple Vitamins-Minerals (DECUBI-VITE PO) Take 1 capsule by mouth daily.   Yes Historical  Provider, MD  thiamine 100 MG tablet Take 100 mg by mouth daily.   Yes Historical Provider, MD  cephALEXin (KEFLEX) 500 MG capsule Take 1 capsule (500 mg total) by mouth 4 (four) times daily. Patient not taking: Reported on 10/21/2014 08/26/14   Joycie Peek, PA-C  cetirizine (ZYRTEC) 10 MG tablet Take 10 mg by mouth daily as needed for allergies.     Historical Provider, MD  hydrOXYzine (ATARAX/VISTARIL) 25 MG tablet Take 25 mg by mouth every 6 (six) hours as needed for itching.    Historical Provider, MD  Melatonin 3 MG TABS Take 1 tablet by mouth at bedtime as needed (sleep).     Historical Provider, MD  promethazine (PHENERGAN) 25 MG tablet Take 25 mg by mouth every 6 (six) hours as needed for nausea or vomiting.    Historical Provider, MD   No Known Allergies CBC:    Component Value Date/Time   WBC 8.2 10/22/2014 0450   HGB 7.0* 10/22/2014 0450   HCT 23.1* 10/22/2014 0450   PLT 365 10/22/2014 0450   MCV 67.7* 10/22/2014 0450   NEUTROABS 11.6* 10/21/2014 0439   LYMPHSABS 2.5 10/21/2014 0439   MONOABS 1.3* 10/21/2014 0439   EOSABS 0.3 10/21/2014 0439   BASOSABS 0.0 10/21/2014 0439   Comprehensive Metabolic Panel:    Component Value Date/Time   NA 139 10/22/2014 0450   K 3.6 10/22/2014 0450   CL 102 10/22/2014 0450   CO2 32 10/22/2014 0450   BUN 11 10/22/2014 0450   CREATININE 1.06 10/22/2014 0450   GLUCOSE 83 10/22/2014 0450   CALCIUM 8.7* 10/22/2014 0450   AST 10* 10/21/2014 1343   ALT 8* 10/21/2014 1343   ALKPHOS 56 10/21/2014 1343   BILITOT 0.5 10/21/2014 1343   PROT 7.1 10/21/2014 1343   ALBUMIN 2.3* 10/21/2014 1343    Physical Exam: Vital Signs: BP 121/48 mmHg  Pulse 73  Temp(Src) 97.8 F (36.6 C) (Oral)  Resp 20  Ht  (1.905 m)  Wt 73.165 kg (161 lb 4.8 oz)  BMI 20.16 kg/m2  SpO2 97% SpO2: SpO2: 97 % O2 Device: O2 Device: Nasal Cannula O2 Flow Rate: O2 Flow Rate (L/min): 2 L/min Intake/output summary:  Intake/Output Summary (Last 24 hours) at  10/22/14 1117 Last data filed at 10/22/14 0440  Gross per 24 hour  Intake   1250 ml  Output   2475 ml  Net  -1225 ml   LBM: Last BM Date: 10/22/14 Baseline Weight: Weight: 73.165 kg (161 lb 4.8  oz) Most recent weight: Weight: 73.165 kg (161 lb 4.8 oz)  Exam Findings:   frail chronically ill appearing LE with chronic dryness icthyosis Diminished breath sounds S1S2 Abdomen colostomy with stool in bag, dressing intact.  Non focal At times has flat affect, word paucity.                     Palliative Performance Scale: 30 Additional Data Reviewed: Recent Labs     10/21/14  0439  10/21/14  1343  10/22/14  0450  WBC  15.7*  12.1*  8.2  HGB  7.6*  6.8*  7.0*  PLT  402*  318  365  NA  135   --   139  BUN  19   --   11  CREATININE  1.14  1.17  1.06     Time In: 1000 Time Out: 1100 Time Total: 60  Greater than 50%  of this time was spent counseling and coordinating care related to the above assessment and plan.  Signed by: Rosalin Hawking, MD [phone]  Rosalin Hawking, MD  10/22/2014, 11:17 AM  Please contact Palliative Medicine Team phone at [phone] for questions and concerns.

## 2014-10-22 NOTE — Progress Notes (Signed)
Patient ID: Craig [name], male   DOB: 1946/01/09, 69 y.o.   MRN: [id number]    Subjective: No complaints this morning.  Objective: Vital signs in last 24 hours: Temp:  [97.8 F (36.6 C)-98.1 F (36.7 C)] 97.8 F (36.6 C) (10/09 0955) Pulse Rate:  [73-79] 73 (10/09 0436) Resp:  [18-22] 20 (10/09 0955) BP: (121-137)/(42-53) 121/48 mmHg (10/09 0955) SpO2:  [97 %-100 %] 97 % (10/09 0955) Weight:  [73.165 kg (161 lb 4.8 oz)] 73.165 kg (161 lb 4.8 oz) (10/09 0500) Last BM Date: 10/22/14  Intake/Output from previous day: 10/08 0701 - 10/09 0700 In: 1250 [P.O.:350; I.V.:900] Out: 2475 [Urine:2475] Intake/Output this shift:    General appearance: alert, cooperative and no distress Incision/Wound: I and D site dressing changed and repacked. Clean without unusual drainage.  Lab Results:   Recent Labs  10/21/14 1343 10/22/14 0450  WBC 12.1* 8.2  HGB 6.8* 7.0*  HCT 22.9* 23.1*  PLT 318 365   BMET  Recent Labs  10/21/14 0439 10/21/14 1343 10/22/14 0450  NA 135  --  139  K 3.8  --  3.6  CL 98*  --  102  CO2 29  --  32  GLUCOSE 87  --  83  BUN 19  --  11  CREATININE 1.14 1.17 1.06  CALCIUM 8.8*  --  8.7*   Results for orders placed or performed during the hospital encounter of 10/21/14  Blood culture (routine x 2)     Status: None (Preliminary result)   Collection Time: 10/21/14  6:53 AM  Result Value Ref Range Status   Specimen Description BLOOD BLOOD LEFT FOREARM  Final   Special Requests BOTTLES DRAWN AEROBIC AND ANAEROBIC 5CC  Final   Culture   Final    NO GROWTH 1 DAY Performed at [facility]    Report Status PENDING  Incomplete  Blood culture (routine x 2)     Status: None (Preliminary result)   Collection Time: 10/21/14  6:53 AM  Result Value Ref Range Status   Specimen Description BLOOD RIGHT ANTECUBITAL  Final   Special Requests BOTTLES DRAWN AEROBIC AND ANAEROBIC 10cc  Final   Culture   Final    NO GROWTH 1 DAY Performed at [facility]    Report Status PENDING  Incomplete  Culture, sputum-assessment     Status: None   Collection Time: 10/21/14  2:20 PM  Result Value Ref Range Status   Specimen Description SPUTUM  Final   Special Requests NONE  Final   Sputum evaluation   Final    THIS SPECIMEN IS ACCEPTABLE. RESPIRATORY CULTURE REPORT TO FOLLOW.   Report Status 10/21/2014 FINAL  Final  MRSA PCR Screening     Status: Abnormal   Collection Time: 10/21/14  9:13 PM  Result Value Ref Range Status   MRSA by PCR POSITIVE (A) NEGATIVE Final    Comment:        The GeneXpert MRSA Assay (FDA approved for NASAL specimens only), is one component of a comprehensive MRSA colonization surveillance program. It is not intended to diagnose MRSA infection nor to guide or monitor treatment for MRSA infections. RESULT CALLED TO, READ BACK BY AND VERIFIED WITH: [name],L RN [phone] COVINGTON,N      Studies/Results: Ct Abdomen Pelvis W Contrast  10/21/2014   CLINICAL DATA:  Leukocytosis. Evaluate known abdominal abscess. Initial encounter.  EXAM: CT ABDOMEN AND PELVIS WITH CONTRAST  TECHNIQUE: Multidetector CT imaging of the abdomen and pelvis  was performed using the standard protocol following bolus administration of intravenous contrast.  CONTRAST:  OMNIPAQUE IOHEXOL 300 MG/ML  SOLN  COMPARISON:  None.  FINDINGS: Prominent bronchiectasis is noted at the lower lung lobes, with patchy bibasilar airspace opacities. This may reflect an atypical infectious process. Aspiration cannot be excluded. A trace right pleural effusion is seen.  There is a focal collection of fluid and trace air at the anterior left mid abdominal wall, measuring approximately 8.1 x 2.1 x 5.3 cm, with peripheral enhancement. This appears to reflect abscess at the prior site of a G-tube placement, as the stomach is tamped up to the abdominal wall underlying the abscess. This may still be contiguous with the stomach.  The liver and spleen are  unremarkable in appearance. The gallbladder is within normal limits. The pancreas and adrenal glands are unremarkable.  A 2.5 cm cyst is noted at the interpole region of the right kidney. A smaller 0.8 cm left renal cyst is noted. There is no evidence of hydronephrosis. No renal or ureteral stones are seen. No perinephric stranding is appreciated.  No free fluid is identified. The small bowel is unremarkable in appearance. The stomach is within normal limits. No acute vascular abnormalities are seen. Diffuse calcification is noted along the abdominal aorta and its branches.  The appendix is normal in caliber, without evidence of appendicitis. Mild diverticulosis is noted along the distal transverse, descending and proximal sigmoid colon, without evidence of diverticulitis.  The patient's left lower quadrant colostomy site is unremarkable in appearance. The Hartmann's pouch is within normal limits. 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  The bladder is decompressed, with a suprapubic catheter in place. The prostate remains normal in size. No inguinal lymphadenopathy is seen.  No acute osseous abnormalities are identified. 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  IMPRESSION: 1. Focal collection of fluid and trace air at the anterior left mid abdominal wall, measuring 8.1 x 2.1 x 5.3 cm, with peripheral enhancement, compatible with an abscess. This appears to be at the prior site of a G-tube placement, as the stomach is tamped up to the abdominal wall underlying the abscess. This may still be contiguous with the stomach. 2. Prominent bronchiectasis at the lower lung lobes, with patchy bibasilar airspace opacities. This may reflect an atypical infectious process. Aspiration cannot be excluded.  Trace right pleural effusion seen. 3. Left lower quadrant colostomy site is unremarkable in appearance. Hartmann's pouch is within normal limits. 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 Mild diverticulosis along the distal transverse, descending and proximal sigmoid colon, without evidence of diverticulitis. 4. Small bilateral renal cysts seen. 5. Diffuse calcification along the abdominal aorta and its branches. 6. 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 These results were called by telephone at the time of interpretation on 10/21/2014 at 6:27 am to [facility], who verbally acknowledged these results.   Electronically Signed   By: Roanna Raider M.D.   On: 10/21/2014 06:27    Anti-infectives: Anti-infectives    Start     Dose/Rate Route Frequency Ordered Stop   10/21/14 2359  vancomycin (VANCOCIN) IVPB 1000 mg/200 mL premix  1,000 mg 200 mL/hr over 60 Minutes Intravenous Every 12 hours 10/21/14 1225     10/21/14 1400  piperacillin-tazobactam (ZOSYN) IVPB 3.375 g     3.375 g 12.5 mL/hr over 240 Minutes Intravenous 3 times per day 10/21/14 1221     10/21/14 0645  vancomycin (VANCOCIN) IVPB 1000 mg/200 mL premix     1,000 mg 200 mL/hr over 60 Minutes Intravenous  Once 10/21/14 0630 10/21/14 1336   10/21/14 0645  piperacillin-tazobactam (ZOSYN) IVPB 3.375 g     3.375 g 12.5 mL/hr over 240 Minutes Intravenous  Once 10/21/14 0630 10/21/14 1059      Assessment/Plan: s/p I&D of abdominal wall abscess. Doing well. Continue current wound care  And antibiotics and await cultures    LOS: 1 day    Chirstine Defrain T 10/22/2014

## 2014-10-22 NOTE — Progress Notes (Signed)
CRITICAL VALUE ALERT  Critical value received:  MRSA positive  Date of notification:  10/21/14  Time of notification:  2329  Critical value read back:Yes.    Nurse who received alert:  Linward Natal, RN  MD notified (1st page):  n/a  Time of first page:  n/a  MD notified (2nd page):  Time of second page:  Responding MD:  n/a  Time MD responded:  N/a  RN put in MRSA positive standing orders.

## 2014-10-22 NOTE — Progress Notes (Signed)
CSW received consult that pt is from a facility- CSW confirmed that pt is from [facility] and is able to return at time of DC.  CSW attempted to confirm plan with pt- pt was sleeping with head under covers and did not respond when CSW was speaking to him.  CSW will continue to follow- updated fl2 on chart to be signed.  Merlyn Lot, LCSWA Clinical Social Worker [phone]

## 2014-10-23 ENCOUNTER — Encounter: Payer: Self-pay | Admitting: Adult Health

## 2014-10-23 LAB — TYPE AND SCREEN
ABO/RH(D): O POS
ANTIBODY SCREEN: NEGATIVE
UNIT DIVISION: 0
Unit division: 0

## 2014-10-23 LAB — VANCOMYCIN, TROUGH: VANCOMYCIN TR: 28 ug/mL — AB (ref 10.0–20.0)

## 2014-10-23 MED ORDER — MORPHINE SULFATE 20 MG/5ML PO SOLN: 5.0000 mg | Freq: Four times a day (QID) | ORAL | Status: DC | PRN

## 2014-10-23 MED ORDER — POLYSACCHARIDE IRON COMPLEX 150 MG PO CAPS: 150.0000 mg | ORAL_CAPSULE | Freq: Every day | ORAL | Status: DC

## 2014-10-23 MED ORDER — SULFAMETHOXAZOLE-TRIMETHOPRIM 800-160 MG PO TABS: 1.0000 | ORAL_TABLET | Freq: Two times a day (BID) | ORAL | Status: DC

## 2014-10-23 MED ORDER — LEVOTHYROXINE SODIUM 88 MCG PO TABS: 88.0000 ug | ORAL_TABLET | Freq: Every day | ORAL | Status: DC

## 2014-10-23 NOTE — Care Management Important Message (Signed)
Important Message  Patient Details IM Letter given to Cookie/Case Manager to present to PatientImportant Message  Patient Details  Name: Craig [name] MRN: [id number] Date of Birth: 1945-10-26   Medicare Important Message Given:  Yes-second notification given    [name] 10/23/2014, 1:40 PM Name: Craig [name] MRN: [id number] Date of Birth: 06/03/1945   Medicare Important Message Given:  Yes-second notification given    [name] 10/23/2014, 1:40 PM

## 2014-10-23 NOTE — Discharge Summary (Signed)
Physician Discharge Summary  Craig [name] FAO:[id number] DOB: [birth year] DOA: 10/21/2014  PCP: Kirt Boys, DO  Admit date: 10/21/2014 Discharge date: 10/23/2014  Time spent: 35 minutes  Recommendations for Outpatient Follow-up:  1. CBC to follow Hgb and WBC's trend 2. BMET to follow electrolytes and renal function 3. Thyroid profile (to assess need for further adjustment on his synthroid dosage)  Discharge Diagnoses:  Active Problems:   Hypothyroidism   Depression   A-fib (HCC)   COPD (chronic obstructive pulmonary disease) (HCC)   Dysphagia   Aspiration pneumonia (HCC)   HCAP (healthcare-associated pneumonia)   Abdominal abscess (HCC)   Leukocytosis   Protein-calorie malnutrition, severe (HCC)   Anemia of chronic disease   GERD without esophagitis   Encounter for palliative care   Discharge Condition: stable and improved. Discharge back to SNF for further care and treatment. Patient to follow up with general surgery on 10/26 as instructed and with PCP in 2 weeks  Diet recommendation: heart healthy diet  Filed Weights   10/22/14 0500  Weight: 73.165 kg (161 lb 4.8 oz)    History of present illness:  69 y.o. male with PMH significant for GERD, chronic resp failure on Oxygen due to COPD, hx of Atrial Fib, HTN, chronic anemia, FTT, hx of diverting colostomy and dysphagia; who came from nursing home due to left abd pain and swelling in this area. Patient denies fever, chills, CP, nausea, vomiting, SOB or any other complaints. He reported swelling, pain and warm sensation on his abd has been going for the last 2 days or so. Patient in ED had CT abd that demonstrated abscess in this area and also presumed bilateral lower lobes infiltrates (with concerns for PNA, most likely aspiration, base on location). WBC's were elevated and the patient was referred for admission. [facility] was consulted, but giving magnitude of medical problems and cormobidities [facility] was ask to admit  him.  Hospital Course:  1-abd abscess: patient hx of gastrostomy tube, no discontinue for over a year and is presenting with left side abd abscess -afebrile, WBC's WNL and no acute distress at discharge -will follow up with surgical service on 10/26 as instructed  -[facility]S consulted and has performed I&D at bedside (some bleeding/purulent discharge appreciated). Recommending wound care (with BID packing dressing changes) -will discharge on bactrim for 7 more days   2-Presumed PNA: seen on CT abd and pelvis; with concerns for Aspiration vs HCAP -was covered with zosyn and vancomycin initially. -once medically stable, discharged on bactrim to complete 7 more days of treatment  -continue flutter valve used -nebulizer treatment -will continue chronic oxygen supplementation -breathing stable -no frank signs of aspiration appreciated by SPL  3-Hypothyroidism:  -TSH low -will continue synthroid, but will adjust dose to daily -Thyroid profile to be follow in 6-8 weeks  4-Depression: no SI or hallucinations -positive flat affect on exam -will continue home medication regimen  5-anemia of chronic disease: with acute blood loss component from I&D -Hgb down to 6.8 -2 units PRBC's transfuse during admission  -will recommend follow up of Hgb trend in outpatient setting -patient will be discharge on niferex  6-chronic Paroxysmal A-fib (HCC): not on anticoagulation at home -will continue rate control and monitor on telemetry  7-chronic resp failure due to COPD (chronic obstructive pulmonary disease) (HCC): patient currently is not wheezing -will continue nebulizer treatment PRN -continue home inhaler regimen -continue Oxygen supplementation (chronically use 2L oxygen supplementation)  8-Dysphagia:  -evaluated by SPL and no frank signs of aspiration  appreciated on exam -will follow rec's and use regular consistency diet with thin liquids  9-Leukocytosis: due to # 1 and 2 -WBC's WNL  at discharge  -plan is to complete a total of 10 days of antibiotics  -will use bactrim as recommended by CCS  10-Protein-calorie malnutrition, severe (HCC):  -continue feeding supplements  11-GERD without esophagitis: will continue PPI  12-Encounter for palliative care: patient with poor quality of life; essentially bed bound and with concerns for Asp PNA. -he is DNR/DNI and in agreement to be seen by hospice at the facility   13-hx of diverting colostomy: to assist healing decubitus ulcer -unclear when, where or by whom this procedure was done -overall colostomy appears stable and without signs of infection/malfunction  -stool seen inside colostomy bag  Procedures:  I&D at bedside on 10/08  Consultations:  CCS  Palliative Care  Discharge Exam: Filed Vitals:   10/23/14 0530  BP: 142/70  Pulse: 74  Temp: 98.1 F (36.7 C)  Resp: 18    General: Feeling better, breathing comfortable and has remained afebrile, reports some pain around wound in his stomach, but minimal and well controlled with analgesic regimen  Cardiovascular: S1 and S2, no rubs or gallops  Respiratory: no wheezing, no rales, positive rhonchi  Abdomen: soft, no guarding, patient with clean dressings in the area of abd abscess, reports pain is well controlled. Colostomy in place and no signs of infections appreciated around ostomy  Musculoskeletal: no edema, no cyanosis  Discharge Instructions   Discharge Instructions    Diet - low sodium heart healthy    Complete by:  As directed      Discharge instructions    Complete by:  As directed   Maintain adequate hydration Take medications as prescribed Please repeat CBC in 5 days to follow Hgb regimen Repeat BMET in 5 days to follow renal function and electrolytes Arrange hospice assessment at the facility Patient is now DNR/DNI          Current Discharge Medication List    START taking these medications   Details  iron polysaccharides  (NIFEREX) 150 MG capsule Take 1 capsule (150 mg total) by mouth daily.    sulfamethoxazole-trimethoprim (BACTRIM DS) 800-160 MG tablet Take 1 tablet by mouth 2 (two) times daily. Treatment for seven more days Qty: 7 tablet, Refills: 0      CONTINUE these medications which have CHANGED   Details  levothyroxine (SYNTHROID, LEVOTHROID) 88 MCG tablet Take 1 tablet (88 mcg total) by mouth daily before breakfast.    morphine 20 MG/5ML solution Take 1.3 mLs (5.2 mg total) by mouth every 6 (six) hours as needed for pain. Qty: 20 mL, Refills: 0      CONTINUE these medications which have NOT CHANGED   Details  amiodarone (PACERONE) 200 MG tablet Take 200 mg by mouth daily.    aspirin EC 81 MG tablet Take 81 mg by mouth daily.    Dextrose-Sodium Chloride (DEXTROSE 5 % AND 0.45% NACL) 5-0.45 % Inject 1,000 mLs into the vein 3 (three) times daily.    escitalopram (LEXAPRO) 10 MG tablet Take 10 mg by mouth daily.    FLORA-Q (FLORA-Q) CAPS capsule Take 1 capsule by mouth daily.    Fluticasone Furoate-Vilanterol (BREO ELLIPTA) 100-25 MCG/INH AEPB Inhale 1 puff into the lungs daily.     folic acid (FOLVITE) 1 MG tablet Take 1 mg by mouth daily.    K Phos Mono-Sod Phos Di & Mono (PHOSPHA 250 NEUTRAL PO) Take  250 mg by mouth 2 (two) times daily.     lansoprazole (PREVACID SOLUTAB) 30 MG disintegrating tablet Take 30 mg by mouth daily.    Multiple Vitamins-Minerals (DECUBI-VITE PO) Take 1 capsule by mouth daily.    thiamine 100 MG tablet Take 100 mg by mouth daily.    cetirizine (ZYRTEC) 10 MG tablet Take 10 mg by mouth daily as needed for allergies.     hydrOXYzine (ATARAX/VISTARIL) 25 MG tablet Take 25 mg by mouth every 6 (six) hours as needed for itching.    Melatonin 3 MG TABS Take 1 tablet by mouth at bedtime as needed (sleep).     promethazine (PHENERGAN) 25 MG tablet Take 25 mg by mouth every 6 (six) hours as needed for nausea or vomiting.      STOP taking these medications      doxycycline (DORYX) 100 MG EC tablet      cephALEXin (KEFLEX) 500 MG capsule        No Known Allergies Follow-up Information    Follow up with LIEPINS, ANDY, PA-C On 11/08/2014.   Specialty:  Surgery   Why:  [facility], 10:15am, arrive no later than 9:45am for paperwork, For wound re-check   Contact information:   [address] [place] [address] [phone]       Follow up with Kirt Boys, DO. Schedule an appointment as soon as possible for a visit in 2 weeks.   Specialty:  Internal Medicine   Contact information:   [address] [place] [address] [phone]       The results of significant diagnostics from this hospitalization (including imaging, microbiology, ancillary and laboratory) are listed below for reference.    Significant Diagnostic Studies: Ct Abdomen Pelvis W Contrast  10/21/2014   CLINICAL DATA:  Leukocytosis. Evaluate known abdominal abscess. Initial encounter.  EXAM: CT ABDOMEN AND PELVIS WITH CONTRAST  TECHNIQUE: Multidetector CT imaging of the abdomen and pelvis was performed using the standard protocol following bolus administration of intravenous contrast.  CONTRAST:  OMNIPAQUE IOHEXOL 300 MG/ML  SOLN  COMPARISON:  None.  FINDINGS: Prominent bronchiectasis is noted at the lower lung lobes, with patchy bibasilar airspace opacities. This may reflect an atypical infectious process. Aspiration cannot be excluded. A trace right pleural effusion is seen.  There is a focal collection of fluid and trace air at the anterior left mid abdominal wall, measuring approximately 8.1 x 2.1 x 5.3 cm, with peripheral enhancement. This appears to reflect abscess at the prior site of a G-tube placement, as the stomach is tamped up to the abdominal wall underlying the abscess. This may still be contiguous with the stomach.  The liver and spleen are unremarkable in appearance. The gallbladder is within normal limits. The pancreas and adrenal  glands are unremarkable.  A 2.5 cm cyst is noted at the interpole region of the right kidney. A smaller 0.8 cm left renal cyst is noted. There is no evidence of hydronephrosis. No renal or ureteral stones are seen. No perinephric stranding is appreciated.  No free fluid is identified. The small bowel is unremarkable in appearance. The stomach is within normal limits. No acute vascular abnormalities are seen. Diffuse calcification is noted along the abdominal aorta and its branches.  The appendix is normal in caliber, without evidence of appendicitis. Mild diverticulosis is noted along the distal transverse, descending and proximal sigmoid colon, without evidence of diverticulitis.  The patient's left lower quadrant colostomy site is unremarkable in  appearance. The Hartmann's pouch is within normal limits. 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  The bladder is decompressed, with a suprapubic catheter in place. The prostate remains normal in size. No inguinal lymphadenopathy is seen.  No acute osseous abnormalities are identified. 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  IMPRESSION: 1. Focal collection of fluid and trace air at the anterior left mid abdominal wall, measuring 8.1 x 2.1 x 5.3 cm, with peripheral enhancement, compatible with an abscess. This appears to be at the prior site of a G-tube placement, as the stomach is tamped up to the abdominal wall underlying the abscess. This may still be contiguous with the stomach. 2. Prominent bronchiectasis at the lower lung lobes, with patchy bibasilar airspace opacities. This may reflect an atypical infectious process. Aspiration cannot be excluded. Trace right pleural effusion seen. 3. Left lower quadrant colostomy site is unremarkable in  appearance. Hartmann's pouch is within normal limits. 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 Mild diverticulosis along the distal transverse, descending and proximal sigmoid colon, without evidence of diverticulitis. 4. Small bilateral renal cysts seen. 5. Diffuse calcification along the abdominal aorta and its branches. 6. 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 These results were called by telephone at the time of interpretation on 10/21/2014 at 6:27 am to [facility] UPSTILL PA, who verbally acknowledged these results.   Electronically Signed   By: Roanna Raider M.D.   On: 10/21/2014 06:27   Dg Retrograde-urethrogram  10/05/2014   CLINICAL DATA:  Urethral stricture.  Indwelling suprapubic catheter.  EXAM: RETROGRADE URETHROGRAM  TECHNIQUE: A small caliber Foley catheter was inserted into the distal penis and its balloon was gently inflated into the fossa navicularis, securing the catheter. Contrast was injected into the urethra in a retrograde fashion, and spot images were obtained.  FLUOROSCOPY TIME:  51 seconds  FINDINGS: The patient's indwelling suggest catheter was clamped.  Foley catheter was inserted in the proximal pendulous urethra just distal to the fossa navicularis.  A high-grade stricture was present involving the distal 3rd of the pendulous urethra.  The bulbous urethra appears normal. The membranous and prostatic segments of the urethra are suboptimally opacified but likely normal.  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   Electronically Signed   By: Charline Bills M.D.   On: 10/05/2014 13:49    Microbiology: Recent Results (from the past 240 hour(s))  Blood culture (routine x 2)     Status: None (Preliminary result)   Collection Time: 10/21/14  6:53 AM  Result Value Ref Range Status   Specimen  Description BLOOD BLOOD LEFT FOREARM  Final   Special Requests BOTTLES DRAWN AEROBIC AND ANAEROBIC 5CC  Final   Culture   Final    NO GROWTH 1 DAY Performed at [facility]    Report Status PENDING  Incomplete  Blood culture (routine x 2)     Status: None (Preliminary result)   Collection Time: 10/21/14  6:53 AM  Result Value Ref Range Status   Specimen Description BLOOD RIGHT ANTECUBITAL  Final   Special Requests BOTTLES DRAWN AEROBIC AND ANAEROBIC 10cc  Final   Culture  Final    NO GROWTH 1 DAY Performed at [facility]    Report Status PENDING  Incomplete  Culture, sputum-assessment     Status: None   Collection Time: 10/21/14  2:20 PM  Result Value Ref Range Status   Specimen Description SPUTUM  Final   Special Requests NONE  Final   Sputum evaluation   Final    THIS SPECIMEN IS ACCEPTABLE. RESPIRATORY CULTURE REPORT TO FOLLOW.   Report Status 10/21/2014 FINAL  Final  Culture, respiratory (NON-Expectorated)     Status: None (Preliminary result)   Collection Time: 10/21/14  2:20 PM  Result Value Ref Range Status   Specimen Description SPUTUM  Final   Special Requests NONE  Final   Gram Stain   Final    ABUNDANT WBC PRESENT,BOTH PMN AND MONONUCLEAR RARE SQUAMOUS EPITHELIAL CELLS PRESENT FEW GRAM POSITIVE COCCI IN PAIRS RARE GRAM POSITIVE RODS RARE GRAM NEGATIVE RODS    Culture   Final    MODERATE GRAM NEGATIVE RODS Performed at [facility]    Report Status PENDING  Incomplete  MRSA PCR Screening     Status: Abnormal   Collection Time: 10/21/14  9:13 PM  Result Value Ref Range Status   MRSA by PCR POSITIVE (A) NEGATIVE Final    Comment:        The GeneXpert MRSA Assay (FDA approved for NASAL specimens only), is one component of a comprehensive MRSA colonization surveillance program. It is not intended to diagnose MRSA infection nor to guide or monitor treatment for MRSA infections. RESULT CALLED TO, READ BACK BY AND VERIFIED  WITH: [name],L RN [phone] COVINGTON,N      Labs: Basic Metabolic Panel:  Recent Labs Lab 10/21/14 0439 10/21/14 1343 10/22/14 0450  NA 135  --  139  K 3.8  --  3.6  CL 98*  --  102  CO2 29  --  32  GLUCOSE 87  --  83  BUN 19  --  11  CREATININE 1.14 1.17 1.06  CALCIUM 8.8*  --  8.7*  MG  --  1.5*  --    Liver Function Tests:  Recent Labs Lab 10/21/14 0439 10/21/14 1343  AST 14* 10*  ALT 9* 8*  ALKPHOS 65 56  BILITOT 0.5 0.5  PROT 8.0 7.1  ALBUMIN 2.7* 2.3*   CBC:  Recent Labs Lab 10/21/14 0439 10/21/14 1343 10/22/14 0450  WBC 15.7* 12.1* 8.2  NEUTROABS 11.6*  --   --   HGB 7.6* 6.8* 7.0*  HCT 25.4* 22.9* 23.1*  MCV 67.9* 67.8* 67.7*  PLT 402* 318 365    Signed:  Vassie Loll  [facility] 10/23/2014, 12:21 PM

## 2014-10-23 NOTE — Progress Notes (Signed)
[facility]. Report called to RN Suzette Battiest at facility. Pt BP 127/52 HR 74 100% 3 L [place] temp 98.9

## 2014-10-23 NOTE — Progress Notes (Signed)
Pt discharge back to nursing facility via ambulance.

## 2014-10-23 NOTE — Progress Notes (Signed)
Chaplain paged by SW to provide [facility] paperwork.  Pt reported to chaplain that he already has paperwork indicating who he would like to serve as HCPOA.  States this paperwork is in [place], but was not more specific about location.  Chaplain provided education around [place] and informed that Mr. Craig [name] would likely need to bring paperwork to facility if they do not have it on file.  [name] did not clearly communicate understanding.  Stated that his HCPOA would "make decisions for me and if he is not able to, then I will go on to [place]."   Pt. declined to complete HCPOA paperwork at hospital.     Belva Crome MDiv

## 2014-10-23 NOTE — Progress Notes (Signed)
Pt vancomycin troph is 28 per lab. Dr. Gwenlyn Perking informed. Pt is not currently on vancomycin and will be discharge back to facility.

## 2014-10-23 NOTE — Discharge Instructions (Signed)
Dressing Change, twice a day °A dressing is a material placed over wounds. It keeps the wound clean, dry, and protected from further injury. This provides an environment that favors wound healing.  °BEFORE YOU BEGIN °· Get your supplies together. Things you may need include: °¨ Saline solution. °¨ Flexible gauze dressing. °¨ Medicated cream. °¨ Tape. °¨ Gloves. °¨ Abdominal dressing pads. °¨ Gauze squares. °¨ Plastic bags. °· Take pain medicine 30 minutes before the dressing change if you need it. °· Take a shower before you do the first dressing change of the day. Use plastic wrap or a plastic bag to prevent the dressing from getting wet. °REMOVING YOUR OLD DRESSING  °· Wash your hands with soap and water. Dry your hands with a clean towel. °· Put on your gloves. °· Remove any tape. °· Carefully remove the old dressing. If the dressing sticks, you may dampen it with warm water to loosen it, or follow your caregiver's specific directions. °· Remove any gauze or packing tape that is in your wound. °· Take off your gloves. °· Put the gloves, tape, gauze, or any packing tape into a plastic bag. °CHANGING YOUR DRESSING °· Open the supplies. °· Take the cap off the saline solution. °· Open the gauze package so that the gauze remains on the inside of the package. °· Put on your gloves. °· Clean your wound as told by your caregiver. °· If you have been told to keep your wound dry, follow those instructions. °· Your caregiver may tell you to do one or more of the following: °¨ Pick up the gauze. Pour the saline solution over the gauze. Squeeze out the extra saline solution. °¨ Put medicated cream or other medicine on your wound if you have been told to do so. °¨ Put the solution soaked gauze only in your wound, not on the skin around it. °¨ Pack your wound loosely or as told by your caregiver. °¨ Put dry gauze on your wound. °¨ Put abdominal dressing pads over the dry gauze if your wet gauze soaks through. °· Tape the  abdominal dressing pads in place so they will not fall off. Do not wrap the tape completely around the affected part (arm, leg, abdomen). °· Wrap the dressing pads with a flexible gauze dressing to secure it in place. °· Take off your gloves. Put them in the plastic bag with the old dressing. Tie the bag shut and throw it away. °· Keep the dressing clean and dry until your next dressing change. °· Wash your hands. °SEEK MEDICAL CARE IF: °· Your skin around the wound looks red. °· Your wound feels more tender or sore. °· You see pus in the wound. °· Your wound smells bad. °· You have a fever. °· Your skin around the wound has a rash that itches and burns. °· You see black or yellow skin in your wound that was not there before. °· You feel nauseous, throw up, and feel very tired. °  °This information is not intended to replace advice given to you by your health care provider. Make sure you discuss any questions you have with your health care provider. °  °Document Released: 02/07/2004 Document Revised: 03/24/2011 Document Reviewed: 11/11/2010 °Elsevier Interactive Patient Education ©2016 [employer] ° °

## 2014-10-23 NOTE — Clinical Documentation Improvement (Signed)
Internal Medicine  Can the diagnosis of Atrial Fibrillation be further specified? Thank you     Chronic Atrial fibrillation  Paroxysmal Atrial fibrillation  Permanent Atrial fibrillation  Persistent Atrial fibrillation  Other  Clinically Undetermined  Document any associated diagnoses/conditions.   Supporting Information:    monitoring on telemetry   Please exercise your independent, professional judgment when responding. A specific answer is not anticipated or expected.   Thank You,  Lavonda Jumbo [facility] Information Management [place] [phone]

## 2014-10-23 NOTE — Clinical Social Work Note (Signed)
Clinical Social Work Assessment  Patient Details  Name: Craig [name] MRN: [id number] Date of Birth: [birth year]  Date of referral:  10/23/14               Reason for consult:  Discharge Planning                Permission sought to share information with:  Other (pt friend, Craig [name]) Permission granted to share information::  Yes, Verbal Permission Granted  Name::     Craig [name]  Agency::     Relationship::  friend  Contact Information:  [phone]  Housing/Transportation Living arrangements for the past 2 months:  [place] of Information:  Friend/Neighbor Patient Interpreter Needed:  None Criminal Activity/Legal Involvement Pertinent to Current Situation/Hospitalization:  No - Comment as needed Significant Relationships:  Friend Lives with:  Facility Resident Do you feel safe going back to the place where you live?  Yes Need for family participation in patient care:  No (Coment) (pt states pt friend, Craig [name] aware of pt return to SNF today)  Care giving concerns:  Pt admitted from [facility] where pt is a long term resident. Pt did not identify any care giving concerns.    Social Worker assessment / plan:  CSW received referral that pt admitted from [facility]. Palliative Medicine Consulted and recommended hospice consult at [facility]. Per MD, pt medically ready for discharge today.   CSW met with pt at bedside. Pt has head covered with covers, but removed covers when CSW requested. Pt confirmed he plans to return to [facility]. Pt understands that he is medically ready to return today. Pt states that pt friend, Craig [name] is aware of discharge today and pt would be agreeable to CSW notified Mr. Chana Bode when transport arranged.   CSW contacted [facility] and confirmed that pt can return to facility today. [facility] stated that pt is a long term resident at facility. CSW notified facility of recommendation to have hospice consult at SNF.  CSW to continue to follow and assist with pt discharge to [facility] this afternoon.   Employment status:  Retired Forensic scientist:  Information systems manager, Medicaid [place] PT Recommendations:  Not assessed at this time [place] / Referral to community resources:  [place]  Patient/Family's Response to care:  Pt alert and oriented x 4. Pt answered direct questions, but did not elaborate during conversation. Pt agreeable to return to [facility].   Patient/Family's Understanding of and Emotional Response to Diagnosis, Current Treatment, and Prognosis:  Pt aware of plan to discharge back to [facility] today.  Emotional Assessment Appearance:  Appears stated age Attitude/Demeanor/Rapport:  Other (pt withdrawn-head under cover, but removed covers to speak with CSW) Affect (typically observed):  Flat Orientation:  Oriented to Self, Oriented to Place, Oriented to  Time, Oriented to Situation Alcohol / Substance use:  Not Applicable Psych involvement (Current and /or in the community):  No (Comment)  Discharge Needs  Concerns to be addressed:  Discharge Planning Concerns Readmission within the last 30 days:  No Current discharge risk:  None Barriers to Discharge:  No Barriers Identified   [place], [place], LCSW 10/23/2014, 12:13 PM  [phone]

## 2014-10-23 NOTE — Progress Notes (Signed)
Patient ID: Ho Parisi, male   DOB: [birth year], 69 y.o.   MRN: [id number]    Subjective: Pt feels well.  No complaints.  Objective: Vital signs in last 24 hours: Temp:  [97.6 F (36.4 C)-98.7 F (37.1 C)] 98.1 F (36.7 C) (10/10 0530) Pulse Rate:  [74-79] 74 (10/10 0530) Resp:  [18-20] 18 (10/10 0530) BP: (119-142)/(47-90) 142/70 mmHg (10/10 0530) SpO2:  [95 %-100 %] 95 % (10/10 0530) Last BM Date: 10/22/14  Intake/Output from previous day: 10/09 0701 - 10/10 0700 In: 1375 [P.O.:390; Blood:385; IV Piggyback:600] Out: 3350 [Urine:3350] Intake/Output this shift:    PE: Abd: soft, wound is very clean with 100% beefy red granulation tissue.  Ostomy is working well.  Lab Results:   Recent Labs  10/21/14 1343 10/22/14 0450  WBC 12.1* 8.2  HGB 6.8* 7.0*  HCT 22.9* 23.1*  PLT 318 365   BMET  Recent Labs  10/21/14 0439 10/21/14 1343 10/22/14 0450  NA 135  --  139  K 3.8  --  3.6  CL 98*  --  102  CO2 29  --  32  GLUCOSE 87  --  83  BUN 19  --  11  CREATININE 1.14 1.17 1.06  CALCIUM 8.8*  --  8.7*   PT/INR No results for input(s): LABPROT, INR in the last 72 hours. CMP     Component Value Date/Time   NA 139 10/22/2014 0450   K 3.6 10/22/2014 0450   CL 102 10/22/2014 0450   CO2 32 10/22/2014 0450   GLUCOSE 83 10/22/2014 0450   BUN 11 10/22/2014 0450   CREATININE 1.06 10/22/2014 0450   CALCIUM 8.7* 10/22/2014 0450   PROT 7.1 10/21/2014 1343   ALBUMIN 2.3* 10/21/2014 1343   AST 10* 10/21/2014 1343   ALT 8* 10/21/2014 1343   ALKPHOS 56 10/21/2014 1343   BILITOT 0.5 10/21/2014 1343   GFRNONAA >60 10/22/2014 0450   GFRAA >60 10/22/2014 0450   Lipase  No results found for: LIPASE     Studies/Results: No results found.  Anti-infectives: Anti-infectives    Start     Dose/Rate Route Frequency Ordered Stop   10/21/14 2359  vancomycin (VANCOCIN) IVPB 1000 mg/200 mL premix     1,000 mg 200 mL/hr over 60 Minutes Intravenous Every 12 hours 10/21/14  1225     10/21/14 1400  piperacillin-tazobactam (ZOSYN) IVPB 3.375 g     3.375 g 12.5 mL/hr over 240 Minutes Intravenous 3 times per day 10/21/14 1221     10/21/14 0645  vancomycin (VANCOCIN) IVPB 1000 mg/200 mL premix     1,000 mg 200 mL/hr over 60 Minutes Intravenous  Once 10/21/14 0630 10/21/14 1336   10/21/14 0645  piperacillin-tazobactam (ZOSYN) IVPB 3.375 g     3.375 g 12.5 mL/hr over 240 Minutes Intravenous  Once 10/21/14 0630 10/21/14 1059       Assessment/Plan  POD 2, s/p bedside I&D of abdominal wall abscess -wound is very clean.  BID dressing changes -patient is surgically stable for dc back to SNF when medically stable -would recommend about 7 days total of abx therapy.  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 -he will follow up with [place] in our clinic on 11-08-14.   LOS: 2 days    Latrenda Irani E 10/23/2014, 10:18 AM Pager: [phone]

## 2014-10-23 NOTE — Progress Notes (Signed)
Pt for discharge to [facility].   CSW facilitated pt discharge needs including contacting facility, faxing pt discharge information via [facility], discussing with pt at bedside, pt declined for CSW to notify pt friend, Kathlen Brunswick, providing RN phone number to call report, and arranging ambulance transport for pt to [facility].   No further social work needs identified at this time.  CSW signing off.   Loletta Specter, MSW, LCSW Clinical Social Work [phone]

## 2014-10-23 NOTE — Progress Notes (Signed)
Daily Progress Note   Patient Name: Craig [name]       Date: 10/23/2014 DOB: 1945-09-09  Age: 69 y.o. MRN#: [id number] Attending Physician: Craig Loll, MD Primary Care Physician: Craig Boys, DO Admit Date: 10/21/2014  Reason for Consultation/Follow-up: Establishing goals of care  Subjective: Patient is resting in bed with covers up his face. He is how ever, awake alert, resting in bed Interval Events/Family meeting: Patient seen by surgery, wound healing well, to continue with dressing changes, patient with pain during dressing changes, has PO Morphine PRN and IV Morphine IV PRN for pain.  Denies any other complaints.  Discussed with next of kin, Craig. Craig [name] who is the patient's friend, the patient has elected Craig [name] to be his surrogate decision maker.  Will ask chaplain to complete HCPOA paperwork DNR DNI discussed again with the patient and his health care representative Craig [name] who is the patient's friend for the past 57 years.  Will change code status to DNR DNI Discussed with patient and his friend Craig [name] about ongoing concern for Aspiration PNA, chronic debility. It is possible that the patient may benefit from hospice support at the facility.  Patient may be D/C back to facility when medically stable.    Length of Stay: 2 days  Current Medications: Scheduled Meds:  . sodium chloride   Intravenous Once  . acidophilus  1 capsule Oral Daily  . amiodarone  200 mg Oral Daily  . aspirin EC  81 mg Oral Daily  . Chlorhexidine Gluconate Cloth  6 each Topical Q0600  . Chlorhexidine Gluconate Cloth  6 each Topical Q0600  . escitalopram  10 mg Oral Daily  . Fluticasone Furoate-Vilanterol  1 puff Inhalation Daily  . folic acid  1 mg Oral Daily  . furosemide  20 mg Intravenous Once  . Influenza vac split quadrivalent PF  0.5 mL Intramuscular Tomorrow-1000  . levothyroxine  88 mcg Oral QAC breakfast  . loratadine  10 mg Oral Daily  . mupirocin  ointment  1 application Nasal BID  . mupirocin ointment   Nasal BID  . pantoprazole  40 mg Oral Daily  . phosphorus  250 mg Oral BID  . piperacillin-tazobactam (ZOSYN)  IV  3.375 g Intravenous 3 times per day  . sodium chloride  3 mL Intravenous Q12H  . thiamine  100 mg Oral Daily  . vancomycin  1,000 mg Intravenous Q12H    Continuous Infusions:    PRN Meds: acetaminophen **OR** acetaminophen, hydrOXYzine, ipratropium-albuterol, morphine injection, morphine CONCENTRATE, ondansetron **OR** ondansetron (ZOFRAN) IV  Palliative Performance Scale: 40%     Vital Signs: BP 142/70 mmHg  Pulse 74  Temp(Src) 98.1 F (36.7 C) (Oral)  Resp 18  Ht  (1.905 m)  Wt 73.165 kg (161 lb 4.8 oz)  BMI 20.16 kg/m2  SpO2 95% SpO2: SpO2: 95 % O2 Device: O2 Device: Nasal Cannula O2 Flow Rate: O2 Flow Rate (L/min): 2 L/min  Intake/output summary:  Intake/Output Summary (Last 24 hours) at 10/23/14 1018 Last data filed at 10/23/14 0929  Gross per 24 hour  Intake   1375 ml  Output   3350 ml  Net  -1975 ml   LBM:   Baseline Weight: Weight: 73.165 kg (161 lb 4.8 oz) Most recent weight: Weight: 73.165 kg (161 lb 4.8 oz)  Physical Exam:    NAD Clear to auscultation S1 S2   Abdomen soft, colostomy working well, wound check by surgery PA Ms. Osborne noted No edema  dry skin Non focal       Additional Data Reviewed: Recent Labs     10/21/14  0439  10/21/14  1343  10/22/14  0450  WBC  15.7*  12.1*  8.2  HGB  7.6*  6.8*  7.0*  PLT  402*  318  365  NA  135   --   139  BUN  19   --   11  CREATININE  1.14  1.17  1.06     Problem List:  Patient Active Problem List   Diagnosis Date Noted  . Aspiration pneumonia (HCC) 10/21/2014  . HCAP (healthcare-associated pneumonia) 10/21/2014  . Abdominal abscess (HCC) 10/21/2014  . Leukocytosis 10/21/2014  . Protein-calorie malnutrition, severe (HCC) 10/21/2014  . Anemia of chronic disease 10/21/2014  . GERD without esophagitis 10/21/2014    . Encounter for palliative care   . UTI (urinary tract infection) 08/28/2014  . Urine retention 08/28/2014  . Chronic pain 01/29/2014  . Hypokalemia 12/20/2013  . Anemia 04/29/2013  . HTN (hypertension) 04/27/2013  . COPD (chronic obstructive pulmonary disease) (HCC) 04/27/2013  . Dysphagia 04/27/2013  . Insomnia 04/10/2013  . Hypothyroidism 03/05/2013  . Depression 03/05/2013  . A-fib (HCC) 03/05/2013  . GERD (gastroesophageal reflux disease) 03/05/2013     Palliative Care Assessment & Plan    Code Status:  DNR  Goals of Care:   continue gentle treatments, to go back to facility when stable.   Desire for further Chaplaincy support:no  3. Symptom Management:   pain: incidental, when dressing changes are being attempted, continue Morphine PO and IV PRN  4. Palliative Prophylaxis:  Stool Softener: yes  5. Prognosis: < 6 months possibly  5. Discharge Planning:  [facility] would [facility] consult over there   Care plan was discussed with  Patient, his chosen surrogate decision maker friend Craig [name].   Thank you for allowing the Palliative Medicine Team to assist in the care of this patient.   Time In: 0950 Time Out: 1020 Total Time 30 Prolonged Time Billed  no     Greater than 50%  of this time was spent counseling and coordinating care related to the above assessment and plan.  Craig [name] Craig Saran, MD  10/23/2014, 10:18 AM  [phone] Please contact Palliative Medicine Team phone at [phone] for questions and concerns.

## 2014-10-24 ENCOUNTER — Encounter: Payer: Self-pay | Admitting: Internal Medicine

## 2014-10-24 ENCOUNTER — Non-Acute Institutional Stay (SKILLED_NURSING_FACILITY): Payer: Medicare Other | Admitting: Internal Medicine

## 2014-10-24 DIAGNOSIS — I482 Chronic atrial fibrillation, unspecified: Secondary | ICD-10-CM

## 2014-10-24 DIAGNOSIS — I10 Essential (primary) hypertension: Secondary | ICD-10-CM | POA: Diagnosis not present

## 2014-10-24 DIAGNOSIS — R1310 Dysphagia, unspecified: Secondary | ICD-10-CM | POA: Diagnosis not present

## 2014-10-24 DIAGNOSIS — K651 Peritoneal abscess: Secondary | ICD-10-CM | POA: Diagnosis not present

## 2014-10-24 DIAGNOSIS — J438 Other emphysema: Secondary | ICD-10-CM | POA: Diagnosis not present

## 2014-10-24 DIAGNOSIS — Z933 Colostomy status: Secondary | ICD-10-CM | POA: Diagnosis not present

## 2014-10-24 DIAGNOSIS — E43 Unspecified severe protein-calorie malnutrition: Secondary | ICD-10-CM

## 2014-10-24 DIAGNOSIS — IMO0002 Reserved for concepts with insufficient information to code with codable children: Secondary | ICD-10-CM

## 2014-10-24 DIAGNOSIS — J189 Pneumonia, unspecified organism: Secondary | ICD-10-CM

## 2014-10-24 DIAGNOSIS — R339 Retention of urine, unspecified: Secondary | ICD-10-CM | POA: Diagnosis not present

## 2014-10-24 DIAGNOSIS — G8929 Other chronic pain: Secondary | ICD-10-CM | POA: Diagnosis not present

## 2014-10-24 DIAGNOSIS — E039 Hypothyroidism, unspecified: Secondary | ICD-10-CM

## 2014-10-24 DIAGNOSIS — K219 Gastro-esophageal reflux disease without esophagitis: Secondary | ICD-10-CM | POA: Diagnosis not present

## 2014-10-24 LAB — CULTURE, RESPIRATORY W GRAM STAIN

## 2014-10-24 LAB — LEGIONELLA PNEUMOPHILA SEROGP 1 UR AG: L. pneumophila Serogp 1 Ur Ag: POSITIVE — AB

## 2014-10-24 LAB — CULTURE, RESPIRATORY

## 2014-10-24 NOTE — Progress Notes (Signed)
Patient ID: Craig [name], male   DOB: May 22, 1945, 69 y.o.   MRN: [id number]    HISTORY AND PHYSICAL   DATE: 10/24/14  Location:  [place] of Service: SNF [phone])   Extended Emergency Contact Information Primary Emergency Contact: Margaretmary Bayley [place] Phone: [phone] Relation: Friend  Advanced Directive information  DNR  Chief Complaint  Patient presents with  . Readmit To SNF    HPI:  69 yo male long term resident seen today as a readmission into SNF following hospital stay for abdominal abscess, HCAP. Abdominal abscess I&D at bedside. He rec'd 2 units PRBC transfusion for hgb 6.8---> oral iron. Pneumonia tx with IV zosyn and vanco ---> bactrim po BID x 7 days. Legionella urine Ag  (+). TSH 0.088  Today he c/o abdominal pain. No f/c. He will complete bactrim 10/17th. No nursing issues. No falls. Appetite ok. Sleeping ok. He is a poor historian due to depression. Hx obtained from chart. Wound care following. He gets nutritional supplements.  PAF - rate controlled on amiodarone 200 mg daily; takes asa 81 mg daily  Hypothyroidism - takes synthroid 125 mcg daily. TSH euthyroid sick at recent admission  GERD/constipation - stable on prevacid 30 mg daily and reglan 5 mg every 6 hours as needed  COPD -  02 dependent; takes breo ellipta 100/25 daily   Depression - stable on  lexapro 10 mg daily; takes hydroxyzine prn itching  Chronic pain- stable on roxanol 5 mg  every 6 hours as needed   Insomnia - stable on melatonin 3 mg as needed at night  Urine retention - s/p foley. Followed by urology   Past Medical History  Diagnosis Date  . COPD (chronic obstructive pulmonary disease) ([place])   . Hypertension   . Myocardial infarction ([place])   . A-fib ([place])   . GERD (gastroesophageal reflux disease)   . Hepatitis B 04/27/2013  . Dysphagia 04/27/2013  . Diverticulosis 03/05/2013  . Sepsis ([place]) 04/26/2013    Past  Surgical History  Procedure Laterality Date  . Colostomy    . Peg tube placement    . Peg tube removal      No care team member to display  Social History   Social History  . Marital Status: Unknown    Spouse Name: N/A  . Number of Children: N/A  . Years of Education: N/A   Occupational History  . Not on file.   Social History Main Topics  . Smoking status: Former Research scientist (life sciences)  . Smokeless tobacco: Not on file  . Alcohol Use: No  . Drug Use: No  . Sexual Activity: Not Currently   Other Topics Concern  . Not on file   Social History Narrative     reports that he has quit smoking. He does not have any smokeless tobacco history on file. He reports that he does not drink alcohol or use illicit drugs.  No family history on file. No family status information on file.    There is no immunization history for the selected administration types on file for this patient.  No Known Allergies  Medications: Patient's Medications  New Prescriptions   No medications on file  Previous Medications   AMIODARONE (PACERONE) 200 MG TABLET    Take 200 mg by mouth daily.   ASPIRIN EC 81 MG TABLET    Take 81 mg by mouth daily.   CETIRIZINE (ZYRTEC) 10 MG TABLET    Take  10 mg by mouth daily as needed for allergies.    DEXTROSE-SODIUM CHLORIDE (DEXTROSE 5 % AND 0.45% NACL) 5-0.45 %    Inject 1,000 mLs into the vein 3 (three) times daily.   ESCITALOPRAM (LEXAPRO) 10 MG TABLET    Take 10 mg by mouth daily.   FLORA-Q (FLORA-Q) CAPS CAPSULE    Take 1 capsule by mouth daily.   FLUTICASONE FUROATE-VILANTEROL (BREO ELLIPTA) 100-25 MCG/INH AEPB    Inhale 1 puff into the lungs daily.    FOLIC ACID (FOLVITE) 1 MG TABLET    Take 1 mg by mouth daily.   HYDROXYZINE (ATARAX/VISTARIL) 25 MG TABLET    Take 25 mg by mouth every 6 (six) hours as needed for itching.   IRON POLYSACCHARIDES (NIFEREX) 150 MG CAPSULE    Take 1 capsule (150 mg total) by mouth daily.   K PHOS MONO-SOD PHOS DI & MONO (PHOSPHA 250  NEUTRAL PO)    Take 250 mg by mouth 2 (two) times daily.    LANSOPRAZOLE (PREVACID SOLUTAB) 30 MG DISINTEGRATING TABLET    Take 30 mg by mouth daily.   LEVOTHYROXINE (SYNTHROID, LEVOTHROID) 88 MCG TABLET    Take 1 tablet (88 mcg total) by mouth daily before breakfast.   MELATONIN 3 MG TABS    Take 1 tablet by mouth at bedtime as needed (sleep).    MORPHINE 20 MG/5ML SOLUTION    Take 1.3 mLs (5.2 mg total) by mouth every 6 (six) hours as needed for pain.   MULTIPLE VITAMINS-MINERALS (DECUBI-VITE PO)    Take 1 capsule by mouth daily.   PROMETHAZINE (PHENERGAN) 25 MG TABLET    Take 25 mg by mouth every 6 (six) hours as needed for nausea or vomiting.   SULFAMETHOXAZOLE-TRIMETHOPRIM (BACTRIM DS) 800-160 MG TABLET    Take 1 tablet by mouth 2 (two) times daily. Treatment for seven more days   THIAMINE 100 MG TABLET    Take 100 mg by mouth daily.  Modified Medications   No medications on file  Discontinued Medications   No medications on file    Review of Systems  Unable to perform ROS: Other  sev depression  Filed Vitals:   10/24/14 1624  BP: 134/76  Pulse: 78  Temp: 98.1 F (36.7 C)  SpO2: 98%   There is no weight on file to calculate BMI.  Physical Exam  Constitutional: He appears well-developed.  Frail appearing, lying in bed in NAD. [place] O2 intact  HENT:  Mouth/Throat: Oropharynx is clear and moist.  Eyes: Pupils are equal, round, and reactive to light. No scleral icterus.  Neck: Neck supple. Carotid bruit is not present. No thyromegaly present.  Cardiovascular: Normal rate, regular rhythm and intact distal pulses.  Exam reveals no gallop and no friction rub.   Murmur (1/6 SEM) heard. no distal LE swelling. No calf TTP  Pulmonary/Chest: Effort normal. He has decreased breath sounds (at base b/l). He has no wheezes. He has no rales. He exhibits no tenderness.  Abdominal: Soft. Bowel sounds are normal. He exhibits distension. He exhibits no abdominal bruit, no pulsatile midline mass  and no mass. There is no tenderness. There is no rebound and no guarding.  Left abdominal dsg intact. Colostomy intact and no bloody stool noted; stoma with nml appearing mucosa.   Genitourinary:  Suprapubic cath intact and foley DTG clear yellow urine  Musculoskeletal: He exhibits edema and tenderness.  Lymphadenopathy:    He has no cervical adenopathy.  Neurological: He is alert.  Skin: Skin is warm and dry. No rash noted.  Psychiatric: He has a normal mood and affect. His behavior is normal.     Labs reviewed: Admission on 10/21/2014, Discharged on 10/23/2014  Component Date Value Ref Range Status  . WBC 10/21/2014 15.7* 4.0 - 10.5 K/uL Final  . RBC 10/21/2014 3.74* 4.22 - 5.81 MIL/uL Final  . Hemoglobin 10/21/2014 7.6* 13.0 - 17.0 g/dL Final  . HCT 10/21/2014 25.4* 39.0 - 52.0 % Final  . MCV 10/21/2014 67.9* 78.0 - 100.0 fL Final  . MCH 10/21/2014 20.3* 26.0 - 34.0 pg Final  . MCHC 10/21/2014 29.9* 30.0 - 36.0 g/dL Final  . RDW 10/21/2014 18.8* 11.5 - 15.5 % Final  . Platelets 10/21/2014 402* 150 - 400 K/uL Final  . Neutrophils Relative % 10/21/2014 74   Final  . Lymphocytes Relative 10/21/2014 16   Final  . Monocytes Relative 10/21/2014 8   Final  . Eosinophils Relative 10/21/2014 2   Final  . Basophils Relative 10/21/2014 0   Final  . Neutro Abs 10/21/2014 11.6* 1.7 - 7.7 K/uL Final  . Lymphs Abs 10/21/2014 2.5  0.7 - 4.0 K/uL Final  . Monocytes Absolute 10/21/2014 1.3* 0.1 - 1.0 K/uL Final  . Eosinophils Absolute 10/21/2014 0.3  0.0 - 0.7 K/uL Final  . Basophils Absolute 10/21/2014 0.0  0.0 - 0.1 K/uL Final  . Smear Review 10/21/2014 MORPHOLOGY UNREMARKABLE   Final  . Sodium 10/21/2014 135  135 - 145 mmol/L Final  . Potassium 10/21/2014 3.8  3.5 - 5.1 mmol/L Final  . Chloride 10/21/2014 98* 101 - 111 mmol/L Final  . CO2 10/21/2014 29  22 - 32 mmol/L Final  . Glucose, Bld 10/21/2014 87  65 - 99 mg/dL Final  . BUN 10/21/2014 19  6 - 20 mg/dL Final  . Creatinine, Ser  10/21/2014 1.14  0.61 - 1.24 mg/dL Final  . Calcium 10/21/2014 8.8* 8.9 - 10.3 mg/dL Final  . Total Protein 10/21/2014 8.0  6.5 - 8.1 g/dL Final  . Albumin 10/21/2014 2.7* 3.5 - 5.0 g/dL Final  . AST 10/21/2014 14* 15 - 41 U/L Final  . ALT 10/21/2014 9* 17 - 63 U/L Final  . Alkaline Phosphatase 10/21/2014 65  38 - 126 U/L Final  . Total Bilirubin 10/21/2014 0.5  0.3 - 1.2 mg/dL Final  . GFR calc non Af Amer 10/21/2014 >60  >60 mL/min Final  . GFR calc Af Amer 10/21/2014 >60  >60 mL/min Final   Comment: (NOTE) The eGFR has been calculated using the CKD EPI equation. This calculation has not been validated in all clinical situations. eGFR's persistently <60 mL/min signify possible Chronic Kidney Disease.   . Anion gap 10/21/2014 8  5 - 15 Final  . Specimen Description 10/21/2014 BLOOD BLOOD LEFT FOREARM   Final  . Special Requests 10/21/2014 BOTTLES DRAWN AEROBIC AND ANAEROBIC 5CC   Final  . Culture 10/21/2014    Final                   Value:NO GROWTH 3 DAYS Performed at [facility]   . Report Status 10/21/2014 PENDING   Incomplete  . Specimen Description 10/21/2014 BLOOD RIGHT ANTECUBITAL   Final  . Special Requests 10/21/2014 BOTTLES DRAWN AEROBIC AND ANAEROBIC 10cc   Final  . Culture 10/21/2014    Final                   Value:NO GROWTH 3 DAYS Performed at [facility]   . Report Status 10/21/2014 PENDING   Incomplete  . Specimen Description 10/21/2014 SPUTUM   Final  . Special Requests 10/21/2014 NONE   Final  . Sputum evaluation 10/21/2014 THIS SPECIMEN IS ACCEPTABLE. RESPIRATORY CULTURE REPORT TO FOLLOW.   Final  . Report Status 10/21/2014 10/21/2014 FINAL   Final  . HIV Screen 4th Generation wRfx 10/21/2014 Non Reactive  Non Reactive Final   Comment: (NOTE) Performed At: [facility] [place], [place] [id number] Lindon Romp MD [id number]   . L. pneumophila Serogp 1 Ur Ag 10/21/2014 Positive* Negative Final   Comment:  (NOTE) Notified account (Jen R.) on 10/24/14 at 3:05pm. Fax requested to [phone].BS Performed At: [facility] [place], [place] [id number] Lindon Romp MD PH:[phone]   . Source of Sample 10/21/2014 URINE, RANDOM   Final  . Strep Pneumo Urinary Antigen 10/21/2014 NEGATIVE  NEGATIVE Final   Comment: PERFORMED AT [facility]        Infection due to S. pneumoniae cannot be absolutely ruled out since the antigen present may be below the detection limit of the test.   . WBC 10/21/2014 12.1* 4.0 - 10.5 K/uL Final  . RBC 10/21/2014 3.38* 4.22 - 5.81 MIL/uL Final  . Hemoglobin 10/21/2014 6.8* 13.0 - 17.0 g/dL Final   Comment: RESULT REPEATED AND VERIFIED CRITICAL RESULT CALLED TO, READ BACK BY AND VERIFIED WITH: LAUREN AT 1414 ON 10/21/14 BY S.VANHOORNE   . HCT 10/21/2014 22.9* 39.0 - 52.0 % Final  . MCV 10/21/2014 67.8* 78.0 - 100.0 fL Final  . MCH 10/21/2014 20.1* 26.0 - 34.0 pg Final  . MCHC 10/21/2014 29.7* 30.0 - 36.0 g/dL Final  . RDW 10/21/2014 18.8* 11.5 - 15.5 % Final  . Platelets 10/21/2014 318  150 - 400 K/uL Final  . Creatinine, Ser 10/21/2014 1.17  0.61 - 1.24 mg/dL Final  . GFR calc non Af Amer 10/21/2014 >60  >60 mL/min Final  . GFR calc Af Amer 10/21/2014 >60  >60 mL/min Final   Comment: (NOTE) The eGFR has been calculated using the CKD EPI equation. This calculation has not been validated in all clinical situations. eGFR's persistently <60 mL/min signify possible Chronic Kidney Disease.   . Total Protein 10/21/2014 7.1  6.5 - 8.1 g/dL Final  . Albumin 10/21/2014 2.3* 3.5 - 5.0 g/dL Final  . AST 10/21/2014 10* 15 - 41 U/L Final  . ALT 10/21/2014 8* 17 - 63 U/L Final  . Alkaline Phosphatase 10/21/2014 56  38 - 126 U/L Final  . Total Bilirubin 10/21/2014 0.5  0.3 - 1.2 mg/dL Final  . Bilirubin, Direct 10/21/2014 <0.1* 0.1 - 0.5 mg/dL Final  . Indirect Bilirubin 10/21/2014 NOT CALCULATED  0.3 - 0.9 mg/dL Final  . Magnesium  10/21/2014 1.5* 1.7 - 2.4 mg/dL Final  . Specimen Description 10/21/2014 SPUTUM   Final  . Special Requests 10/21/2014 NONE   Final  . Gram Stain 10/21/2014    Final                   Value:ABUNDANT WBC PRESENT,BOTH PMN AND MONONUCLEAR RARE SQUAMOUS EPITHELIAL CELLS PRESENT FEW GRAM POSITIVE COCCI IN PAIRS RARE GRAM POSITIVE RODS RARE GRAM NEGATIVE RODS   . Culture 10/21/2014    Final                   Value:MODERATE PSEUDOMONAS AERUGINOSA Performed at [facility]   . Report Status 10/21/2014 10/24/2014 FINAL  Final  . Organism ID, Bacteria 10/21/2014 PSEUDOMONAS AERUGINOSA   Final  . ABO/RH(D) 10/21/2014 O POS   Final  . Antibody Screen 10/21/2014 NEG   Final  . Sample Expiration 10/21/2014 10/24/2014   Final  . Unit Number 10/21/2014 [id number]   Final  . Blood Component Type 10/21/2014 RED CELLS,LR   Final  . Unit division 10/21/2014 00   Final  . Status of Unit 10/21/2014 ISSUED,FINAL   Final  . Transfusion Status 10/21/2014 OK TO TRANSFUSE   Final  . Crossmatch Result 10/21/2014 Compatible   Final  . Unit Number 10/21/2014 [id number]   Final  . Blood Component Type 10/21/2014 RED CELLS,LR   Final  . Unit division 10/21/2014 00   Final  . Status of Unit 10/21/2014 ISSUED,FINAL   Final  . Transfusion Status 10/21/2014 OK TO TRANSFUSE   Final  . Crossmatch Result 10/21/2014 Compatible   Final  . Order Confirmation 10/21/2014 ORDER PROCESSED BY BLOOD BANK   Final  . ABO/RH(D) 10/21/2014 O POS   Final  . Sodium 10/22/2014 139  135 - 145 mmol/L Final  . Potassium 10/22/2014 3.6  3.5 - 5.1 mmol/L Final  . Chloride 10/22/2014 102  101 - 111 mmol/L Final  . CO2 10/22/2014 32  22 - 32 mmol/L Final  . Glucose, Bld 10/22/2014 83  65 - 99 mg/dL Final  . BUN 10/22/2014 11  6 - 20 mg/dL Final  . Creatinine, Ser 10/22/2014 1.06  0.61 - 1.24 mg/dL Final  . Calcium 10/22/2014 8.7* 8.9 - 10.3 mg/dL Final  . GFR calc non Af Amer 10/22/2014 >60  >60 mL/min Final  . GFR calc  Af Amer 10/22/2014 >60  >60 mL/min Final   Comment: (NOTE) The eGFR has been calculated using the CKD EPI equation. This calculation has not been validated in all clinical situations. eGFR's persistently <60 mL/min signify possible Chronic Kidney Disease.   . Anion gap 10/22/2014 5  5 - 15 Final  . WBC 10/22/2014 8.2  4.0 - 10.5 K/uL Final  . RBC 10/22/2014 3.41* 4.22 - 5.81 MIL/uL Final  . Hemoglobin 10/22/2014 7.0* 13.0 - 17.0 g/dL Final  . HCT 10/22/2014 23.1* 39.0 - 52.0 % Final  . MCV 10/22/2014 67.7* 78.0 - 100.0 fL Final  . MCH 10/22/2014 20.5* 26.0 - 34.0 pg Final  . MCHC 10/22/2014 30.3  30.0 - 36.0 g/dL Final  . RDW 10/22/2014 18.9* 11.5 - 15.5 % Final  . Platelets 10/22/2014 365  150 - 400 K/uL Final  . TSH 10/22/2014 0.088* 0.350 - 4.500 uIU/mL Final  . MRSA by PCR 10/21/2014 POSITIVE* NEGATIVE Final   Comment:        The GeneXpert MRSA Assay (FDA approved for NASAL specimens only), is one component of a comprehensive MRSA colonization surveillance program. It is not intended to diagnose MRSA infection nor to guide or monitor treatment for MRSA infections. RESULT CALLED TO, READ BACK BY AND VERIFIED WITH: [name],L RN [phone] COVINGTON,N   . Vancomycin Tr 10/23/2014 28* 10.0 - 20.0 ug/mL Final   Comment: CRITICAL RESULT CALLED TO, READ BACK BY AND VERIFIED WITH: MCCLINTOCK,L. RN AT 1203 10/23/14 MULLINS,T   Admission on 08/26/2014, Discharged on 08/27/2014  Component Date Value Ref Range Status  . Sodium 08/26/2014 141  135 - 145 mmol/L Final  . Potassium 08/26/2014 3.6  3.5 - 5.1 mmol/L Final  . Chloride 08/26/2014 96* 101 - 111 mmol/L Final  . CO2 08/26/2014 39* 22 - 32 mmol/L Final  .  Glucose, Bld 08/26/2014 124* 65 - 99 mg/dL Final  . BUN 08/26/2014 19  6 - 20 mg/dL Final  . Creatinine, Ser 08/26/2014 1.21  0.61 - 1.24 mg/dL Final  . Calcium 08/26/2014 9.2  8.9 - 10.3 mg/dL Final  . Total Protein 08/26/2014 8.2* 6.5 - 8.1 g/dL Final  . Albumin  08/26/2014 2.9* 3.5 - 5.0 g/dL Final  . AST 08/26/2014 18  15 - 41 U/L Final  . ALT 08/26/2014 10* 17 - 63 U/L Final  . Alkaline Phosphatase 08/26/2014 90  38 - 126 U/L Final  . Total Bilirubin 08/26/2014 0.3  0.3 - 1.2 mg/dL Final  . GFR calc non Af Amer 08/26/2014 60* >60 mL/min Final  . GFR calc Af Amer 08/26/2014 >60  >60 mL/min Final   Comment: (NOTE) The eGFR has been calculated using the CKD EPI equation. This calculation has not been validated in all clinical situations. eGFR's persistently <60 mL/min signify possible Chronic Kidney Disease.   . Anion gap 08/26/2014 6  5 - 15 Final  . WBC 08/26/2014 11.0* 4.0 - 10.5 K/uL Final  . RBC 08/26/2014 3.64* 4.22 - 5.81 MIL/uL Final  . Hemoglobin 08/26/2014 7.7* 13.0 - 17.0 g/dL Final  . HCT 08/26/2014 27.1* 39.0 - 52.0 % Final  . MCV 08/26/2014 74.5* 78.0 - 100.0 fL Final  . MCH 08/26/2014 21.2* 26.0 - 34.0 pg Final  . MCHC 08/26/2014 28.4* 30.0 - 36.0 g/dL Final  . RDW 08/26/2014 21.4* 11.5 - 15.5 % Final  . Platelets 08/26/2014 297  150 - 400 K/uL Final  . Neutrophils Relative % 08/26/2014 68  43 - 77 % Final  . Lymphocytes Relative 08/26/2014 21  12 - 46 % Final  . Monocytes Relative 08/26/2014 8  3 - 12 % Final  . Eosinophils Relative 08/26/2014 3  0 - 5 % Final  . Basophils Relative 08/26/2014 0  0 - 1 % Final  . Neutro Abs 08/26/2014 7.5  1.7 - 7.7 K/uL Final  . Lymphs Abs 08/26/2014 2.3  0.7 - 4.0 K/uL Final  . Monocytes Absolute 08/26/2014 0.9  0.1 - 1.0 K/uL Final  . Eosinophils Absolute 08/26/2014 0.3  0.0 - 0.7 K/uL Final  . Basophils Absolute 08/26/2014 0.0  0.0 - 0.1 K/uL Final  . RBC Morphology 08/26/2014 TARGET CELLS   Final  . Color, Urine 08/26/2014 YELLOW  YELLOW Final  . APPearance 08/26/2014 TURBID* CLEAR Final  . Specific Gravity, Urine 08/26/2014 1.011  1.005 - 1.030 Final  . pH 08/26/2014 6.0  5.0 - 8.0 Final  . Glucose, UA 08/26/2014 NEGATIVE  NEGATIVE mg/dL Final  . Hgb urine dipstick 08/26/2014 LARGE*  NEGATIVE Final  . Bilirubin Urine 08/26/2014 NEGATIVE  NEGATIVE Final  . Ketones, ur 08/26/2014 NEGATIVE  NEGATIVE mg/dL Final  . Protein, ur 08/26/2014 NEGATIVE  NEGATIVE mg/dL Final  . Urobilinogen, UA 08/26/2014 0.2  0.0 - 1.0 mg/dL Final  . Nitrite 08/26/2014 POSITIVE* NEGATIVE Final  . Leukocytes, UA 08/26/2014 LARGE* NEGATIVE Final  . Specimen Description 08/26/2014 URINE, CATHETERIZED   Final  . Special Requests 08/26/2014 NONE   Final  . Culture 08/26/2014    Final                   Value:>=100,000 COLONIES/mL CITROBACTER SPECIES Performed at [facility]   . Report Status 08/26/2014 08/29/2014 FINAL   Final  . Organism ID, Bacteria 08/26/2014 CITROBACTER SPECIES   Final  . Bacteria, UA 08/26/2014 MANY* RARE Final  .  Urine-Other 08/26/2014 FIELD OBSCURED BY WBC'S   Final    Ct Abdomen Pelvis W Contrast  10/21/2014   CLINICAL DATA:  Leukocytosis. Evaluate known abdominal abscess. Initial encounter.  EXAM: CT ABDOMEN AND PELVIS WITH CONTRAST  TECHNIQUE: Multidetector CT imaging of the abdomen and pelvis was performed using the standard protocol following bolus administration of intravenous contrast.  CONTRAST:  166m OMNIPAQUE IOHEXOL 300 MG/ML  SOLN  COMPARISON:  None.  FINDINGS: Prominent bronchiectasis is noted at the lower lung lobes, with patchy bibasilar airspace opacities. This may reflect an atypical infectious process. Aspiration cannot be excluded. A trace right pleural effusion is seen.  There is a focal collection of fluid and trace air at the anterior left mid abdominal wall, measuring approximately 8.1 x 2.1 x 5.3 cm, with peripheral enhancement. This appears to reflect abscess at the prior site of a G-tube placement, as the stomach is tamped up to the abdominal wall underlying the abscess. This may still be contiguous with the stomach.  The liver and spleen are unremarkable in appearance. The gallbladder is within normal limits. The pancreas and adrenal glands are  unremarkable.  A 2.5 cm cyst is noted at the interpole region of the right kidney. A smaller 0.8 cm left renal cyst is noted. There is no evidence of hydronephrosis. No renal or ureteral stones are seen. No perinephric stranding is appreciated.  No free fluid is identified. The small bowel is unremarkable in appearance. The stomach is within normal limits. No acute vascular abnormalities are seen. Diffuse calcification is noted along the abdominal aorta and its branches.  The appendix is normal in caliber, without evidence of appendicitis. Mild diverticulosis is noted along the distal transverse, descending and proximal sigmoid colon, without evidence of diverticulitis.  The patient's left lower quadrant colostomy site is unremarkable in appearance. The Hartmann's pouch is within normal limits. 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  The bladder is decompressed, with a suprapubic catheter in place. The prostate remains normal in size. No inguinal lymphadenopathy is seen.  No acute osseous abnormalities are identified. 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  IMPRESSION: 1. Focal collection of fluid and trace air at the anterior left mid abdominal wall, measuring 8.1 x 2.1 x 5.3 cm, with peripheral enhancement, compatible with an abscess. This appears to be at the prior site of a G-tube placement, as the stomach is tamped up to the abdominal wall underlying the abscess. This may still be contiguous with the stomach. 2. Prominent bronchiectasis at the lower lung lobes, with patchy bibasilar airspace opacities. This may reflect an atypical infectious process. Aspiration cannot be excluded. Trace right pleural effusion seen. 3. Left lower quadrant colostomy site is unremarkable in appearance.  Hartmann's pouch is within normal limits. 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 Mild diverticulosis along the distal transverse, descending and proximal sigmoid colon, without evidence of diverticulitis. 4. Small bilateral renal cysts seen. 5. Diffuse calcification along the abdominal aorta and its branches. 6. 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 These results were called by telephone at the time of interpretation on 10/21/2014 at 6:27 am to SBrulePA, who verbally acknowledged these results.  Electronically Signed   By: Garald Balding M.D.   On: 10/21/2014 06:27   Dg Retrograde-urethrogram  10/05/2014   CLINICAL DATA:  Urethral stricture.  Indwelling suprapubic catheter.  EXAM: RETROGRADE URETHROGRAM  TECHNIQUE: A small caliber Foley catheter was inserted into the distal penis and its balloon was gently inflated into the fossa navicularis, securing the catheter. Contrast was injected into the urethra in a retrograde fashion, and spot images were obtained.  FLUOROSCOPY TIME:  51 seconds  FINDINGS: The patient's indwelling suggest catheter was clamped.  Foley catheter was inserted in the proximal pendulous urethra just distal to the fossa navicularis.  A high-grade stricture was present involving the distal 3rd of the pendulous urethra.  The bulbous urethra appears normal. The membranous and prostatic segments of the urethra are suboptimally opacified but likely normal.  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   Electronically Signed   By: Julian Hy M.D.   On: 10/05/2014 13:49     Assessment/Plan   ICD-9-CM ICD-10-CM   1. Abdominal abscess (HCC) 567.22 K65.1   2. HCAP (healthcare-associated pneumonia) 22 J18.9   3. Severe protein-calorie malnutrition ([place]) 262 E43   4. Hypothyroidism,  unspecified hypothyroidism type 244.9 E03.9   5. Chronic atrial fibrillation (HCC) 427.31 I48.2   6. Chronic pain 338.29 G89.29   7. 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 Essential hypertension 401.9 I10   9. Other emphysema (HCC) 492.8 J43.8   10. GERD without esophagitis 530.81 K21.9   11. Dysphagia 787.20 R13.10   12. Urine retention 788.20 R33.9     --check CBC w diff, BMP  --reck TSH in 6 weeks as thyroid med adjusted during hospital stay  --cont other meds as ordered  --cont nutritional supplements as ordered  --cont [place] O2 ATC as ordered  --PT/OT/ST as indicated  --GOAL: short term rehab then cont long term care.  Communicated with pt and nursing.  --will follow  Evren Shankland S. Perlie Gold  [facility] [address] [place], [place] [address] [phone] Cell (Monday-Friday 8 AM - 5 PM) [phone] After 5 PM and follow prompts

## 2014-10-26 LAB — CULTURE, BLOOD (ROUTINE X 2)
Culture: NO GROWTH
Culture: NO GROWTH

## 2014-10-27 ENCOUNTER — Non-Acute Institutional Stay (SKILLED_NURSING_FACILITY): Payer: Medicare Other | Admitting: Adult Health

## 2014-10-27 DIAGNOSIS — E038 Other specified hypothyroidism: Secondary | ICD-10-CM

## 2014-10-27 DIAGNOSIS — E034 Atrophy of thyroid (acquired): Secondary | ICD-10-CM | POA: Diagnosis not present

## 2014-10-27 DIAGNOSIS — R634 Abnormal weight loss: Secondary | ICD-10-CM | POA: Diagnosis not present

## 2014-10-27 DIAGNOSIS — K651 Peritoneal abscess: Secondary | ICD-10-CM | POA: Diagnosis not present

## 2014-10-27 DIAGNOSIS — IMO0002 Reserved for concepts with insufficient information to code with codable children: Secondary | ICD-10-CM

## 2014-11-19 ENCOUNTER — Encounter: Payer: Self-pay | Admitting: Adult Health

## 2014-11-19 NOTE — Progress Notes (Signed)
Patient ID: Craig [name], male   DOB: [birth year], 69 y.o.   MRN: [id number]   Facility: [facility]      No Known Allergies  Chief Complaint  Patient presents with  . Medical Management of Chronic Issues    HPI:  He is a long term resident of this facility being seen for the management of his chronic illnesses. Staff reports that he has what appears to be a shingles rash on his left flank area. It is painful and itches.  There are no reports of fever present.    Past Medical History  Diagnosis Date  . COPD (chronic obstructive pulmonary disease) (HCC)   . Hypertension   . Myocardial infarction (HCC)   . A-fib (HCC)   . GERD (gastroesophageal reflux disease)   . Hepatitis B 04/27/2013  . Dysphagia 04/27/2013  . Diverticulosis 03/05/2013  . Sepsis (HCC) 04/26/2013    Past Surgical History  Procedure Laterality Date  . Colostomy    . Peg tube placement    . Peg tube removal      VITAL SIGNS BP 132/70 mmHg  Pulse 82  Ht 5\' 10"  (1.778 m)  Wt 179 lb (81.194 kg)  BMI 25.68 kg/m2  SpO2 91%  Patient's Medications  New Prescriptions   No medications on file  Previous Medications   Amiodarone 200 mg  200 mg daily    Asa 81 mg  81 mg daily   breo  1 puff daily    mvi Take daily    Folic acid 1 mg daily    hydroxyzine 25 mg  Every 6 hours as needed for itching   lexapro 10 mg  Take 10 mg daily    Melatonin 3 mg  3 mg nightly prn   roxanol  5 mg every 6 hours as needed   phospha 250 neutral tablet Take twice daily    Prevacid 30 mg  Take 30 mg daily    Synthroid 112 mcg 112 mcg daily    Thiamine 100 mg  Take 100 mg daily    Zyrtec 10 mg   Take daily   Modified Medications   No medications on file  Discontinued Medications   No medications on file     SIGNIFICANT DIAGNOSTIC EXAMS   10-31-13: kub: no acute abnormality  12-16-13: chest x-ray; right lower lobe atelectasis  08-26-14: s/p foley placement: Placement of 12 French suprapubic bladder  drainage catheter. Urine return was cloudy and a sample of urine was sent for urinalysis and culture.    LABS REVIEWED:   12-19-13: wbc 12.7; hg 8.1; hct 28.3; mcv 65.2; plt 347;glucose 70; bun 18; creat 1.52; k+5.5; na++136; liver normal albumin 2.8; phos 4.0; mag 1.5; tsh 0.028 ammonia 56;  12-28-13: glucose 87; bun 20; creat 1.5; k+4.7; na++140 02-08-14: tsh 0.372  04-26-14: glucose 81; bun 17; creat 1.33; k+4.7; na++139; phos 4.5; psa 1.24 08-14-14: wbc 8.8; hgb 8.2; hct 28.5; mcv 71.8; plt 250; glucose 72; bun 13; creat 1.13; k+ 4.7; na++140; liver normal albumin 3.2; phos 3.3; mag 1.6; tsh 0.298  08-26-14: wbc 11.0; hgb 7.7; hct 27.1; mcv 74.5. ;plt 297; glucose 124; bun 19; creat 1.21; k+3.6; na++141; liver normal albumin 2.9; urine culture: gram neg rods.  10-02-14: tsh 0.236      Review of Systems Constitutional: Negative for appetite change and fatigue.  HENT: Negative for congestion.   Respiratory: Negative for cough, chest tightness and shortness of breath.   Cardiovascular: Negative for chest pain,  palpitations and leg swelling.  Gastrointestinal: Negative for nausea, abdominal pain, diarrhea and constipation.  Musculoskeletal: Negative for myalgias and arthralgias.  Skin: Negative for pallor. has painful rash  Psychiatric/Behavioral: The patient is not nervous/anxious.    Physical Exam Constitutional: He appears well-developed and well-nourished. No distress.  Eyes: Conjunctivae are normal.  Neck: Neck supple. No JVD present. No thyromegaly present.  Cardiovascular: Normal rate, regular rhythm and intact distal pulses.   Respiratory: Effort normal and breath sounds normal. No respiratory distress. He has no wheezes.  GI: Soft. Bowel sounds are normal. He exhibits no distension. There is no tenderness.  Has left lower quad colostomy   Has s/p foley  Musculoskeletal: He exhibits no edema.  Able to move upper extremities Does not move lower extremities      Lymphadenopathy:    He has no cervical adenopathy.  Neurological: He is alert.  Skin: Skin is warm and dry. He is not diaphoretic.  Has shingles rash on left flank with papules present  Psychiatric: He has a normal mood and affect      ASSESSMENT/ PLAN:  1. Afib: his heart rate is stable; will continue amiodarone 200 mg daily for rate control; will continue asa 81 mg daily will monitor his status.   2. Hypokalemia: stable not on medications will monitor   3. Hypothyroidism: his tsh today is 0.236; will lower his synthroid to 100 mcg daily and will check tsh and free t4 in 6 weeks.   4. Genella Rife: will continue prevacid 30 mg daily  will monitor  5.  COPD: he is stable is 02 dependent; will continue breo ellipta 100/25 daily   6. Depression: is emotionally stable will continue lexapro 10 mg daily; his weight is stable at 181 pounds    7. Constipation: is presently not on medications will monitor.   8. Chronic pain:  will continue roxanol 5 mg  every 6 hours as needed  Will begin him on ultram 50 mg every 6 hours routinely for 10 days then every 6 hours as needed for shingles pain   9. Insomnia: will continue melatonin 3 mg as needed at night  10. Urine retention: is status post suprapubic foley  11. Shingles: will begin valtrex 1 gm three times daily for one week and will begin ultram 05 mg every 6 hours routinely for 10 days then every 6 hours as needed        Synthia Innocent NP [facility]  Contact [phone] Monday through Friday 8am- 5pm  After hours call [phone]

## 2014-11-27 ENCOUNTER — Non-Acute Institutional Stay (SKILLED_NURSING_FACILITY): Payer: Medicare Other | Admitting: Adult Health

## 2014-11-27 DIAGNOSIS — E034 Atrophy of thyroid (acquired): Secondary | ICD-10-CM | POA: Diagnosis not present

## 2014-11-27 DIAGNOSIS — I482 Chronic atrial fibrillation, unspecified: Secondary | ICD-10-CM

## 2014-11-27 DIAGNOSIS — K651 Peritoneal abscess: Secondary | ICD-10-CM | POA: Diagnosis not present

## 2014-11-27 DIAGNOSIS — D638 Anemia in other chronic diseases classified elsewhere: Secondary | ICD-10-CM | POA: Diagnosis not present

## 2014-11-27 DIAGNOSIS — IMO0002 Reserved for concepts with insufficient information to code with codable children: Secondary | ICD-10-CM

## 2014-11-27 DIAGNOSIS — R339 Retention of urine, unspecified: Secondary | ICD-10-CM

## 2014-11-27 DIAGNOSIS — R634 Abnormal weight loss: Secondary | ICD-10-CM | POA: Diagnosis not present

## 2014-11-27 DIAGNOSIS — E038 Other specified hypothyroidism: Secondary | ICD-10-CM

## 2014-11-27 DIAGNOSIS — G8929 Other chronic pain: Secondary | ICD-10-CM

## 2014-11-27 DIAGNOSIS — J438 Other emphysema: Secondary | ICD-10-CM | POA: Diagnosis not present

## 2014-11-27 DIAGNOSIS — K219 Gastro-esophageal reflux disease without esophagitis: Secondary | ICD-10-CM | POA: Diagnosis not present

## 2014-12-08 LAB — TSH: TSH: 0.2 u[IU]/mL — AB (ref 0.41–5.90)

## 2014-12-24 ENCOUNTER — Encounter: Payer: Self-pay | Admitting: Adult Health

## 2014-12-24 NOTE — Progress Notes (Signed)
Patient ID: Craig [name], male   DOB: 1945-05-05, 69 y.o.   MRN: [id number]    Facility: [facility]      No Known Allergies  Chief Complaint  Patient presents with  . Acute Visit    patient status.     HPI:  Staff is concerned. He has stopped eating and drinking for the past couple of days. There are no reports of fever present. He has been treated for a shingles rash in Sept which as resolved. He is unable to fully participate in the hpi or ros due lethargy; but is  complaining of abdominal pain.   Past Medical History  Diagnosis Date  . COPD (chronic obstructive pulmonary disease) (HCC)   . Hypertension   . Myocardial infarction (HCC)   . A-fib (HCC)   . GERD (gastroesophageal reflux disease)   . Hepatitis B 04/27/2013  . Dysphagia 04/27/2013  . Diverticulosis 03/05/2013  . Sepsis (HCC) 04/26/2013    Past Surgical History  Procedure Laterality Date  . Colostomy    . Peg tube placement    . Peg tube removal      VITAL SIGNS BP 150/66 mmHg  Pulse 82  Ht  (1.778 m)  Wt 181 lb (82.101 kg)  BMI 25.97 kg/m2  SpO2 93%  Patient's Medications   Amiodarone 200 mg  200 mg daily    Asa 81 mg  81 mg daily   breo  1 puff daily    mvi Take daily    Folic acid 1 mg daily    hydroxyzine 25 mg  Every 6 hours as needed for itching   lexapro 10 mg  Take 10 mg daily    Melatonin 3 mg  3 mg nightly prn   roxanol  5 mg every 6 hours as needed   phospha 250 neutral tablet Take twice daily    Prevacid 30 mg  Take 30 mg daily    Synthroid 112 mcg 112 mcg daily    Thiamine 100 mg  Take 100 mg daily    Zyrtec 10 mg   Take daily     New Prescriptions   No medications on file  Previous Medications  Modified Medications   No medications on file  Discontinued Medications   No medications on file     SIGNIFICANT DIAGNOSTIC EXAMS  10-31-13: kub: no acute abnormality  12-16-13: chest x-ray; right lower lobe atelectasis  08-26-14: s/p foley placement: Placement of 12  French suprapubic bladder drainage catheter. Urine return was cloudy and a sample of urine was sent for urinalysis and culture.  10-20-14: kub: non-specific gas pattern.     LABS REVIEWED:   12-19-13: wbc 12.7; hg 8.1; hct 28.3; mcv 65.2; plt 347;glucose 70; bun 18; creat 1.52; k+5.5; na++136; liver normal albumin 2.8; phos 4.0; mag 1.5; tsh 0.028 ammonia 56;  12-28-13: glucose 87; bun 20; creat 1.5; k+4.7; na++140 02-08-14: tsh 0.372  04-26-14: glucose 81; bun 17; creat 1.33; k+4.7; na++139; phos 4.5; psa 1.24 08-14-14: wbc 8.8; hgb 8.2; hct 28.5; mcv 71.8; plt 250; glucose 72; bun 13; creat 1.13; k+ 4.7; na++140; liver normal albumin 3.2; phos 3.3; mag 1.6; tsh 0.298  08-26-14: wbc 11.0; hgb 7.7; hct 27.1; mcv 74.5. ;plt 297; glucose 124; bun 19; creat 1.21; k+3.6; na++141; liver normal albumin 2.9; urine culture: gram neg rods.  10-02-14: tsh 0.236    Review of Systems  Unable to perform ROS: medical condition      Physical Exam Constitutional: frail.  No distress.  Eyes: Conjunctivae are normal.  Neck: Neck supple. No JVD present. No thyromegaly present.  Cardiovascular: Normal rate, regular rhythm and intact distal pulses.   Respiratory: Effort normal and breath sounds normal. No respiratory distress. He has no wheezes.  GI: Soft. Bowel sounds are normal. He exhibits no distension. There is no tenderness.  Has left lower quad colostomy   Has s/p foley  Musculoskeletal: He exhibits no edema.  Able to move upper extremities Does not move lower extremities    Lymphadenopathy:    He has no cervical adenopathy.  Neurological: He is alert.  Skin: Skin is warm and dry. He is not diaphoretic.  Has abscess around colostomy stoma with redness and warmth present Poor skin turgor and dry mouth   Psychiatric: He has a normal mood and affect      ASSESSMENT/ PLAN:  Abdominal wall abscess Loss of appetite Acute renal failure  Will begin D5 1/2 NS at 100 cc per hour for 3  liters Will check cbc; cmp Will begin doxycycline 100 mg twice daily for 2 weeks with florastor Will check vital signs twice daily     Time spent with patient 45   minutes >50% time spent counseling; reviewing medical record; tests; labs; and developing future plan of care      Synthia Innocenteborah Green NP [facility]  Contact [phone] Monday through Friday 8am- 5pm  After hours call [phone]

## 2014-12-26 NOTE — Progress Notes (Signed)
This encounter was created in error - please disregard.

## 2014-12-29 ENCOUNTER — Non-Acute Institutional Stay (SKILLED_NURSING_FACILITY): Payer: Medicare Other | Admitting: Adult Health

## 2014-12-29 DIAGNOSIS — K219 Gastro-esophageal reflux disease without esophagitis: Secondary | ICD-10-CM

## 2014-12-29 DIAGNOSIS — J438 Other emphysema: Secondary | ICD-10-CM | POA: Diagnosis not present

## 2014-12-29 DIAGNOSIS — I482 Chronic atrial fibrillation, unspecified: Secondary | ICD-10-CM

## 2014-12-29 DIAGNOSIS — E034 Atrophy of thyroid (acquired): Secondary | ICD-10-CM | POA: Diagnosis not present

## 2014-12-29 DIAGNOSIS — R339 Retention of urine, unspecified: Secondary | ICD-10-CM

## 2014-12-29 DIAGNOSIS — R634 Abnormal weight loss: Secondary | ICD-10-CM

## 2014-12-29 DIAGNOSIS — D638 Anemia in other chronic diseases classified elsewhere: Secondary | ICD-10-CM

## 2014-12-29 DIAGNOSIS — I10 Essential (primary) hypertension: Secondary | ICD-10-CM | POA: Diagnosis not present

## 2014-12-29 DIAGNOSIS — E038 Other specified hypothyroidism: Secondary | ICD-10-CM | POA: Diagnosis not present

## 2014-12-31 ENCOUNTER — Encounter: Payer: Self-pay | Admitting: Adult Health

## 2014-12-31 DIAGNOSIS — R634 Abnormal weight loss: Secondary | ICD-10-CM | POA: Insufficient documentation

## 2014-12-31 NOTE — Progress Notes (Signed)
Patient ID: Craig [name], male   DOB: 06-06-1945, 69 y.o.   MRN: [id number]    Facility: [facility]      No Known Allergies  Chief Complaint  Patient presents with  . Acute Visit    wound management     HPI:  I have been asked to evaluate his abdominal wound. The wound bed is pink and healthy. He is having pain present in his wound area. The treatment nurse would like to place him on a wound vac but has concerns about pain management. He is presently being treated for an UTI.  There are no reports of fever present.   Past Medical History  Diagnosis Date  . COPD (chronic obstructive pulmonary disease) (HCC)   . Hypertension   . Myocardial infarction (HCC)   . A-fib (HCC)   . GERD (gastroesophageal reflux disease)   . Hepatitis B 04/27/2013  . Dysphagia 04/27/2013  . Diverticulosis 03/05/2013  . Sepsis (HCC) 04/26/2013    Past Surgical History  Procedure Laterality Date  . Colostomy    . Peg tube placement    . Peg tube removal      VITAL SIGNS BP 143/65 mmHg  Pulse 100  Ht  (1.778 m)  Wt 157 lb (71.215 kg)  BMI 22.53 kg/m2  SpO2 95%  Patient's Medications  New Prescriptions   No medications on file  Previous Medications   AMIODARONE (PACERONE) 200 MG TABLET    Take 200 mg by mouth daily.   ASPIRIN EC 81 MG TABLET    Take 81 mg by mouth daily.   CETIRIZINE (ZYRTEC) 10 MG TABLET    Take 10 mg by mouth daily as needed for allergies.    ESCITALOPRAM (LEXAPRO) 10 MG TABLET    Take 10 mg by mouth daily.   FLORA-Q (FLORA-Q) CAPS CAPSULE    Take 1 capsule by mouth daily.   FLUTICASONE FUROATE-VILANTEROL (BREO ELLIPTA) 100-25 MCG/INH AEPB    Inhale 1 puff into the lungs daily.    FOLIC ACID (FOLVITE) 1 MG TABLET    Take 1 mg by mouth daily.   HYDROXYZINE (ATARAX/VISTARIL) 25 MG TABLET    Take 25 mg by mouth every 6 (six) hours as needed for itching.   IRON POLYSACCHARIDES (NIFEREX) 150 MG CAPSULE    Take 1 capsule (150 mg total) by mouth daily.   K PHOS MONO-SOD PHOS  DI & MONO (PHOSPHA 250 NEUTRAL PO)    Take 250 mg by mouth 2 (two) times daily.    LANSOPRAZOLE (PREVACID SOLUTAB) 30 MG DISINTEGRATING TABLET    Take 30 mg by mouth daily.   LEVOTHYROXINE (SYNTHROID, LEVOTHROID) 88 MCG TABLET    Take 1 tablet (88 mcg total) by mouth daily before breakfast.   MELATONIN 3 MG TABS    Take 1 tablet by mouth at bedtime as needed (sleep).    MORPHINE 20 MG/5ML SOLUTION    Take 1.3 mLs (5.2 mg total) by mouth every 6 (six) hours as needed for pain.   MULTIPLE VITAMINS-MINERALS (DECUBI-VITE PO)    Take 1 capsule by mouth daily.   PROMETHAZINE (PHENERGAN) 25 MG TABLET    Take 25 mg by mouth every 6 (six) hours as needed for nausea or vomiting.   SULFAMETHOXAZOLE-TRIMETHOPRIM (BACTRIM DS) 800-160 MG TABLET    Take 1 tablet by mouth 2 (two) times daily. Treatment for seven more days   THIAMINE 100 MG TABLET    Take 100 mg by mouth daily.  Modified Medications  No medications on file  Discontinued Medications     SIGNIFICANT DIAGNOSTIC EXAMS   10-31-13: kub: no acute abnormality  12-16-13: chest x-ray; right lower lobe atelectasis  08-26-14: s/p foley placement: Placement of 12 French suprapubic bladder drainage catheter. Urine return was cloudy and a sample of urine was sent for urinalysis and culture.  10-20-14: kub: non-specific gas pattern.   10-21-14: ct of abdomen and pelvis: 1. Focal collection of fluid and trace air at the anterior left mid abdominal wall, measuring 8.1 x 2.1 x 5.3 cm, with peripheral enhancement, compatible with an abscess. This appears to be at the prior site of a G-tube placement, as the stomach is tamped up to the abdominal wall underlying the abscess. This may still be contiguous with the stomach. 2. Prominent bronchiectasis at the lower lung lobes, with patchy bibasilar airspace opacities. This may reflect an atypical infectious process. Aspiration cannot be excluded. Trace right pleural effusion seen. 3. Left lower quadrant colostomy  site is unremarkable in appearance. Hartmann's pouch is within normal limits. 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 Mild diverticulosis along the distal transverse, descending and proximal sigmoid colon, without evidence of diverticulitis. 4. Small bilateral renal cysts seen. 5. Diffuse calcification along the abdominal aorta and its branches. 6. 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     LABS REVIEWED:   12-19-13: wbc 12.7; hg 8.1; hct 28.3; mcv 65.2; plt 347;glucose 70; bun 18; creat 1.52; k+5.5; na++136; liver normal albumin 2.8; phos 4.0; mag 1.5; tsh 0.028 ammonia 56;  12-28-13: glucose 87; bun 20; creat 1.5; k+4.7; na++140 02-08-14: tsh 0.372  04-26-14: glucose 81; bun 17; creat 1.33; k+4.7; na++139; phos 4.5; psa 1.24 08-14-14: wbc 8.8; hgb 8.2; hct 28.5; mcv 71.8; plt 250; glucose 72; bun 13; creat 1.13; k+ 4.7; na++140; liver normal albumin 3.2; phos 3.3; mag 1.6; tsh 0.298  08-26-14: wbc 11.0; hgb 7.7; hct 27.1; mcv 74.5. ;plt 297; glucose 124; bun 19; creat 1.21; k+3.6; na++141; liver normal albumin 2.9; urine culture: gram neg rods.  10-02-14: tsh 0.236  10-21-14 :wbc 15.7; hgb 7.6; hct 25.4; mcv 67.9; plt 402; glucose 87; bun 19; creat 1.14; k+ 3.8; na++135; liver normal albumin 2.7; blood culture X2: no growth; HIV; nr 10-22-14: wbc 8.2; hgb 7.0; hct 23.1; mcv 67.7; plt 365; glucose 83; bun 11; creat 1.06; k+ 3.6; n++139; tsh 0.088 10-23-14: urine culture: klebsiella pneumonia proteus mirabilis: septra 10-25-14; wbc 9.8; hgb 9.8; hct 31.3; mcv 72.8; plt 396; glucose 72; bun 9; creat 0.85; k+ 3.7; na++139; tsh 0.267       Physical Exam Constitutional: frail. No distress.  Eyes: Conjunctivae are normal.  Neck: Neck supple. No JVD present. No thyromegaly present.  Cardiovascular: Normal rate, regular rhythm and intact distal pulses.     Respiratory: Effort normal and breath sounds normal. No respiratory distress. He has no wheezes.  GI: Soft. Bowel sounds are normal. He exhibits no distension. There is no tenderness.  Has left lower quad colostomy   Has s/p foley  Musculoskeletal: He exhibits no edema.  Able to move upper extremities Does not move lower extremities    Lymphadenopathy:    He has no cervical adenopathy.  Neurological: He is alert.  Skin: Skin is warm and dry. He is not diaphoretic.  Abdominal wound: 2.5 x 11.0 x 1.5 cm pink healthy wound bed Psychiatric: He has  a normal mood and affect      ASSESSMENT/ PLAN:  1. Abdominal abscess: will use wound vac per facility protocol; will change roxanol to 5 mg every 6 hours routinely and every 2 hours as needed for pain management  2. Hypothyroidism: his tsh is low at 0.267; will lower synthroid to 75 mcg daily and will check tsh and free t4 in 6 weeks  3. Weight loss: his current weight is 157 pounds his weight in Sept was 179 pounds will begin remeron 7.5 mg nightly for 30 days and will monitor     Synthia Innocenteborah Berkley Cronkright NP [facility]  Contact [phone] Monday through Friday 8am- 5pm  After hours call [phone]

## 2014-12-31 NOTE — Progress Notes (Signed)
This encounter was created in error - please disregard.

## 2015-01-08 ENCOUNTER — Encounter: Payer: Self-pay | Admitting: Adult Health

## 2015-01-08 NOTE — Progress Notes (Signed)
Patient ID: Craig [name], male   DOB: 21-Jul-1945, 69 y.o.   MRN: [id number]   [facility]      No Known Allergies  Chief Complaint  Patient presents with  . Medical Management of Chronic Issues    HPI:  He is a long term resident of this facility being seen for the management of his chronic illnesses. His abdominal abscess is healing. His current weight is 157 pounds and is without change. He is not voicing any complaints or concerns. There are no nursing concerns at this time.    Past Medical History  Diagnosis Date  . COPD (chronic obstructive pulmonary disease) (HCC)   . Hypertension   . Myocardial infarction (HCC)   . A-fib (HCC)   . GERD (gastroesophageal reflux disease)   . Hepatitis B 04/27/2013  . Dysphagia 04/27/2013  . Diverticulosis 03/05/2013  . Sepsis (HCC) 04/26/2013    Past Surgical History  Procedure Laterality Date  . Colostomy    . Peg tube placement    . Peg tube removal      VITAL SIGNS BP 127/70 mmHg  Pulse 80  Ht 5\' 10"  (1.778 m)  Wt 157 lb (71.215 kg)  BMI 22.53 kg/m2  Patient's Medications  New Prescriptions   No medications on file  Previous Medications   AMIODARONE (PACERONE) 200 MG TABLET    Take 200 mg by mouth daily.   ASPIRIN EC 81 MG TABLET    Take 81 mg by mouth daily.   CETIRIZINE (ZYRTEC) 10 MG TABLET    Take 10 mg by mouth daily as needed for allergies.    ESCITALOPRAM (LEXAPRO) 10 MG TABLET    Take 10 mg by mouth daily.   FLORA-Q (FLORA-Q) CAPS CAPSULE    Take 1 capsule by mouth daily.   FLUTICASONE FUROATE-VILANTEROL (BREO ELLIPTA) 100-25 MCG/INH AEPB    Inhale 1 puff into the lungs daily.    FOLIC ACID (FOLVITE) 1 MG TABLET    Take 1 mg by mouth daily.   HYDROXYZINE (ATARAX/VISTARIL) 25 MG TABLET    Take 25 mg by mouth every 6 (six) hours as needed for itching.   IRON POLYSACCHARIDES (NIFEREX) 150 MG CAPSULE    Take 1 capsule (150 mg total) by mouth daily.   K PHOS MONO-SOD PHOS DI & MONO (PHOSPHA 250 NEUTRAL PO)     Take 250 mg by mouth 2 (two) times daily.    LANSOPRAZOLE (PREVACID SOLUTAB) 30 MG DISINTEGRATING TABLET    Take 30 mg by mouth daily.   LEVOTHYROXINE (SYNTHROID, LEVOTHROID) 88 MCG TABLET    Take 1 tablet (88 mcg total) by mouth daily before breakfast.   MELATONIN 3 MG TABS    Take 1 tablet by mouth at bedtime as needed (sleep).    MORPHINE 20 MG/5ML SOLUTION    5 mg every 6 hours and every 2 hours as  needed   MULTIPLE VITAMINS-MINERALS (DECUBI-VITE PO)    Take 1 capsule by mouth daily.   PROMETHAZINE (PHENERGAN) 25 MG TABLET    Take 25 mg by mouth every 6 (six) hours as needed for nausea or vomiting.   THIAMINE 100 MG TABLET    Take 100 mg by mouth daily.  Modified Medications   No medications on file  Discontinued Medications   No medications on file     SIGNIFICANT DIAGNOSTIC EXAMS   10-31-13: kub: no acute abnormality  12-16-13: chest x-ray; right lower lobe atelectasis  08-26-14: s/p foley placement: Placement of 12  French suprapubic bladder drainage catheter. Urine return was cloudy and a sample of urine was sent for urinalysis and culture.  10-20-14: kub: non-specific gas pattern.   10-21-14: ct of abdomen and pelvis: 1. Focal collection of fluid and trace air at the anterior left mid abdominal wall, measuring 8.1 x 2.1 x 5.3 cm, with peripheral enhancement, compatible with an abscess. This appears to be at the prior site of a G-tube placement, as the stomach is tamped up to the abdominal wall underlying the abscess. This may still be contiguous with the stomach. 2. Prominent bronchiectasis at the lower lung lobes, with patchy bibasilar airspace opacities. This may reflect an atypical infectious process. Aspiration cannot be excluded. Trace right pleural effusion seen. 3. Left lower quadrant colostomy site is unremarkable in appearance. Hartmann's pouch is within normal limits. 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 Mild  diverticulosis along the distal transverse, descending and proximal sigmoid colon, without evidence of diverticulitis. 4. Small bilateral renal cysts seen. 5. Diffuse calcification along the abdominal aorta and its branches. 6. 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     LABS REVIEWED:   12-19-13: wbc 12.7; hg 8.1; hct 28.3; mcv 65.2; plt 347;glucose 70; bun 18; creat 1.52; k+5.5; na++136; liver normal albumin 2.8; phos 4.0; mag 1.5; tsh 0.028 ammonia 56;  12-28-13: glucose 87; bun 20; creat 1.5; k+4.7; na++140 02-08-14: tsh 0.372  04-26-14: glucose 81; bun 17; creat 1.33; k+4.7; na++139; phos 4.5; psa 1.24 08-14-14: wbc 8.8; hgb 8.2; hct 28.5; mcv 71.8; plt 250; glucose 72; bun 13; creat 1.13; k+ 4.7; na++140; liver normal albumin 3.2; phos 3.3; mag 1.6; tsh 0.298  08-26-14: wbc 11.0; hgb 7.7; hct 27.1; mcv 74.5. ;plt 297; glucose 124; bun 19; creat 1.21; k+3.6; na++141; liver normal albumin 2.9; urine culture: gram neg rods.  10-02-14: tsh 0.236  10-21-14 :wbc 15.7; hgb 7.6; hct 25.4; mcv 67.9; plt 402; glucose 87; bun 19; creat 1.14; k+ 3.8; na++135; liver normal albumin 2.7; blood culture X2: no growth; HIV; nr 10-22-14: wbc 8.2; hgb 7.0; hct 23.1; mcv 67.7; plt 365; glucose 83; bun 11; creat 1.06; k+ 3.6; n++139; tsh 0.088 10-23-14: urine culture: klebsiella pneumonia proteus mirabilis: septra 10-25-14; wbc 9.8; hgb 9.8; hct 31.3; mcv 72.8; plt 396; glucose 72; bun 9; creat 0.85; k+ 3.7; na++139; tsh 0.267    Review of Systems Constitutional: Negative for appetite change and fatigue.  HENT: Negative for congestion.   Respiratory: Negative for cough, chest tightness and shortness of breath.   Cardiovascular: Negative for chest pain, palpitations and leg swelling.  Gastrointestinal: Negative for nausea, abdominal pain, diarrhea and constipation.  Musculoskeletal: Negative for myalgias and  arthralgias.  Skin: Negative for pallor. Has wound on stomach  Psychiatric/Behavioral: The patient is not nervous/anxious.      Physical Exam Constitutional: frail. No distress.  Eyes: Conjunctivae are normal.  Neck: Neck supple. No JVD present. No thyromegaly present.  Cardiovascular: Normal rate, regular rhythm and intact distal pulses.   Respiratory: Effort normal and breath sounds normal. No respiratory distress. He has no wheezes.  GI: Soft. Bowel sounds are normal. He exhibits no distension. There is no tenderness.  Has left lower quad colostomy   Has s/p foley  Musculoskeletal: He exhibits no edema.  Able to move upper extremities Does not move lower extremities    Lymphadenopathy:    He has  no cervical adenopathy.  Neurological: He is alert.  Skin: Skin is warm and dry. He is not diaphoretic.  Abdominal wound: 1.5 x 8.0cm superficial pink healthy wound bed Psychiatric: He has a normal mood and affect      ASSESSMENT/ PLAN:  1. Hypothyroidism: will conitnue synthroid 75 mcg daily; tsh is 0.267; his follow up labs are pending.  2. Weight loss:  Current weight is 157 pounds will continue to monitor his weight in Sept 2016 was 179 pounds.  He is currently on a 30 day course of remeron 7.5 mg   3. Afib: his heart rate is stable; will continue amiodarone 200 mg daily for rate control; will continue asa 81 mg daily will monitor his status.   4. Genella Rife: will continue prevacid 30 mg daily  will monitor  5.  COPD: he is stable is 02 dependent; will continue breo ellipta 100/25 daily   6. Depression: is emotionally stable will continue lexapro 10 mg daily; his weight is stable at 181 pounds    7. Constipation: is presently not on medications will monitor.   8. Chronic pain:  will continue roxanol 5 mg  every 6 hours  Routinely and every 2 hours as needed as needed    9. Insomnia: will continue melatonin 3 mg as needed at night  10. Urine retention: is status post  suprapubic foley       Synthia Innocent NP [facility]  Contact [phone] Monday through Friday 8am- 5pm  After hours call [phone]

## 2015-01-13 ENCOUNTER — Encounter: Payer: Self-pay | Admitting: Adult Health

## 2015-01-13 NOTE — Progress Notes (Signed)
Patient ID: Craig [name], male   DOB: [birth year], 69 y.o.   MRN: [id number]    [facility]      No Known Allergies  Chief Complaint  Patient presents with  . Medical Management of Chronic Issues    HPI:  He is a long term resident of this facility being seen for the management of his chronic illnesses. Overall his status is stable. His abdominal area is nearly resolved. He is not voicing any complaints at this time. There are no nursing concerns at this time.    Past Medical History  Diagnosis Date  . COPD (chronic obstructive pulmonary disease) (HCC)   . Hypertension   . Myocardial infarction (HCC)   . A-fib (HCC)   . GERD (gastroesophageal reflux disease)   . Hepatitis B 04/27/2013  . Dysphagia 04/27/2013  . Diverticulosis 03/05/2013  . Sepsis (HCC) 04/26/2013    Past Surgical History  Procedure Laterality Date  . Colostomy    . Peg tube placement    . Peg tube removal      VITAL SIGNS BP 126/70 mmHg  Pulse 74  Ht  (1.778 m)  Wt 157 lb (71.215 kg)  BMI 22.53 kg/m2  SpO2 95%  Patient's Medications  New Prescriptions   No medications on file  Previous Medications   AMIODARONE (PACERONE) 200 MG TABLET    Take 200 mg by mouth daily.   ASPIRIN EC 81 MG TABLET    Take 81 mg by mouth daily.   CETIRIZINE (ZYRTEC) 10 MG TABLET    Take 10 mg by mouth daily as needed for allergies.    ESCITALOPRAM (LEXAPRO) 10 MG TABLET    Take 10 mg by mouth daily.   FLORA-Q (FLORA-Q) CAPS CAPSULE    Take 1 capsule by mouth daily.   FLUTICASONE FUROATE-VILANTEROL (BREO ELLIPTA) 100-25 MCG/INH AEPB    Inhale 1 puff into the lungs daily.    FOLIC ACID (FOLVITE) 1 MG TABLET    Take 1 mg by mouth daily.   HYDROXYZINE (ATARAX/VISTARIL) 25 MG TABLET    Take 25 mg by mouth every 6 (six) hours as needed for itching.   IRON POLYSACCHARIDES (NIFEREX) 150 MG CAPSULE    Take 1 capsule (150 mg total) by mouth daily.   K PHOS MONO-SOD PHOS DI & MONO (PHOSPHA 250 NEUTRAL PO)     Take 250 mg by mouth 2 (two) times daily.    LEVOTHYROXINE (SYNTHROID, LEVOTHROID) 75 MCG TABLET    Take 75 mcg by mouth daily before breakfast.   MELATONIN 3 MG TABS    Take 1 tablet by mouth at bedtime as needed (sleep).    MORPHINE 20 MG/5ML SOLUTION    Take 1.3 mLs (5.2 mg total) by mouth every 6 (six) hours as needed for pain.   MULTIPLE VITAMINS-MINERALS (DECUBI-VITE PO)    Take 1 capsule by mouth daily.   OMEPRAZOLE (PRILOSEC) 20 MG CAPSULE    Take 20 mg by mouth daily.   PROMETHAZINE (PHENERGAN) 25 MG TABLET    Take 25 mg by mouth every 6 (six) hours as needed for nausea or vomiting.   SULFAMETHOXAZOLE-TRIMETHOPRIM (BACTRIM DS) 800-160 MG TABLET    Take 1 tablet by mouth 2 (two) times daily. Treatment for seven more days   THIAMINE 100 MG TABLET    Take 100 mg by mouth daily.  Modified Medications   No medications on file  Discontinued Medications     SIGNIFICANT DIAGNOSTIC EXAMS   12-16-13: chest  x-ray; right lower lobe atelectasis  08-26-14: s/p foley placement: Placement of 12 French suprapubic bladder drainage catheter. Urine return was cloudy and a sample of urine was sent for urinalysis and culture.  10-20-14: kub: non-specific gas pattern.   10-21-14: ct of abdomen and pelvis: 1. Focal collection of fluid and trace air at the anterior left mid abdominal wall, measuring 8.1 x 2.1 x 5.3 cm, with peripheral enhancement, compatible with an abscess. This appears to be at the prior site of a G-tube placement, as the stomach is tamped up to the abdominal wall underlying the abscess. This may still be contiguous with the stomach. 2. Prominent bronchiectasis at the lower lung lobes, with patchy bibasilar airspace opacities. This may reflect an atypical infectious process. Aspiration cannot be excluded. Trace right pleural effusion seen. 3. Left lower quadrant colostomy site is unremarkable in appearance. Hartmann's pouch is within normal limits. Herniation of a segment of ileum into the  colostomy hernia, without evidence for obstruction. Mild diverticulosis along the distal transverse, descending and proximal sigmoid colon, without evidence of diverticulitis. 4. Small bilateral renal cysts seen. 5. Diffuse calcification along the abdominal aorta and its branches. 6. 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     LABS REVIEWED:    12-28-13: glucose 87; bun 20; creat 1.5; k+4.7; na++140 02-08-14: tsh 0.372  04-26-14: glucose 81; bun 17; creat 1.33; k+4.7; na++139; phos 4.5; psa 1.24 08-14-14: wbc 8.8; hgb 8.2; hct 28.5; mcv 71.8; plt 250; glucose 72; bun 13; creat 1.13; k+ 4.7; na++140; liver normal albumin 3.2; phos 3.3; mag 1.6; tsh 0.298  08-26-14: wbc 11.0; hgb 7.7; hct 27.1; mcv 74.5. ;plt 297; glucose 124; bun 19; creat 1.21; k+3.6; na++141; liver normal albumin 2.9; urine culture: gram neg rods.  10-02-14: tsh 0.236  10-21-14 :wbc 15.7; hgb 7.6; hct 25.4; mcv 67.9; plt 402; glucose 87; bun 19; creat 1.14; k+ 3.8; na++135; liver normal albumin 2.7; blood culture X2: no growth; HIV; nr 10-22-14: wbc 8.2; hgb 7.0; hct 23.1; mcv 67.7; plt 365; glucose 83; bun 11; creat 1.06; k+ 3.6; n++139; tsh 0.088 10-23-14: urine culture: klebsiella pneumonia proteus mirabilis: septra 10-25-14; wbc 9.8; hgb 9.8; hct 31.3; mcv 72.8; plt 396; glucose 72; bun 9; creat 0.85; k+ 3.7; na++139; tsh 0.267 12-08-14: tsh 0.195; free T4: 1.62 (normal)     Review of Systems Constitutional: Negative for appetite change and fatigue.  HENT: Negative for congestion.   Respiratory: Negative for cough, chest tightness and shortness of breath.   Cardiovascular: Negative for chest pain, palpitations and leg swelling.  Gastrointestinal: Negative for nausea, abdominal pain, diarrhea and constipation.  Musculoskeletal: Negative for myalgias and arthralgias.  Skin: Negative for pallor.  Psychiatric/Behavioral: The  patient is not nervous/anxious.      Physical Exam Constitutional: frail. No distress.  Eyes: Conjunctivae are normal.  Neck: Neck supple. No JVD present. No thyromegaly present.  Cardiovascular: Normal rate, regular rhythm and intact distal pulses.   Respiratory: Effort normal and breath sounds normal. No respiratory distress. He has no wheezes.  GI: Soft. Bowel sounds are normal. He exhibits no distension. There is no tenderness.  Has left lower quad colostomy   Has s/p foley  Musculoskeletal: He exhibits no edema.  Able to move upper extremities Does not move lower extremities    Lymphadenopathy:    He has no cervical adenopathy.  Neurological: He is alert.  Skin: Skin is warm  and dry. He is not diaphoretic.  Abdominal wound: nearly resolved  Psychiatric: He has a normal mood and affect      ASSESSMENT/ PLAN:  1. Hypothyroidism: will continue synthroid 75 mcg daily;  tsh 0.195; free T4: 1.62   2. Weight loss:  Current weight is 157 pounds will continue to monitor his weight in Sept 2016 was 179 pounds.  He completed his remeron therapy.   3. Afib: his heart rate is stable; will continue amiodarone 200 mg daily for rate control; will continue asa 81 mg daily will monitor his status.   4. Genella Rife: will continue prilosec 20 mg daily  will monitor  5.  COPD: he is stable is 02 dependent; will continue breo ellipta 100/25 daily   6. Depression: is emotionally stable will continue lexapro 10 mg daily; his weight is stable at 157 pounds    7. Constipation: is presently not on medications will monitor.  Is status post colostomy   8. Chronic pain:  will continue roxanol 5 mg  every 6 hours  Routinely and every 2 hours as needed as needed    9. Insomnia: will continue melatonin 3 mg as needed at night  10. Urine retention: is status post suprapubic foley            Synthia Innocent NP [facility]  Contact [phone] Monday through Friday 8am- 5pm    After hours call [phone]

## 2015-01-17 ENCOUNTER — Non-Acute Institutional Stay (SKILLED_NURSING_FACILITY): Payer: Medicare Other | Admitting: Adult Health

## 2015-01-17 DIAGNOSIS — IMO0002 Reserved for concepts with insufficient information to code with codable children: Secondary | ICD-10-CM

## 2015-01-17 DIAGNOSIS — K651 Peritoneal abscess: Secondary | ICD-10-CM

## 2015-01-17 DIAGNOSIS — E43 Unspecified severe protein-calorie malnutrition: Secondary | ICD-10-CM | POA: Diagnosis not present

## 2015-01-25 ENCOUNTER — Encounter: Payer: Self-pay | Admitting: Adult Health

## 2015-01-25 ENCOUNTER — Non-Acute Institutional Stay (SKILLED_NURSING_FACILITY): Payer: Medicare Other | Admitting: Adult Health

## 2015-01-25 DIAGNOSIS — F32A Depression, unspecified: Secondary | ICD-10-CM

## 2015-01-25 DIAGNOSIS — R339 Retention of urine, unspecified: Secondary | ICD-10-CM | POA: Diagnosis not present

## 2015-01-25 DIAGNOSIS — E034 Atrophy of thyroid (acquired): Secondary | ICD-10-CM | POA: Diagnosis not present

## 2015-01-25 DIAGNOSIS — K219 Gastro-esophageal reflux disease without esophagitis: Secondary | ICD-10-CM | POA: Diagnosis not present

## 2015-01-25 DIAGNOSIS — G8929 Other chronic pain: Secondary | ICD-10-CM | POA: Diagnosis not present

## 2015-01-25 DIAGNOSIS — E038 Other specified hypothyroidism: Secondary | ICD-10-CM | POA: Diagnosis not present

## 2015-01-25 DIAGNOSIS — E43 Unspecified severe protein-calorie malnutrition: Secondary | ICD-10-CM | POA: Diagnosis not present

## 2015-01-25 DIAGNOSIS — F329 Major depressive disorder, single episode, unspecified: Secondary | ICD-10-CM | POA: Diagnosis not present

## 2015-01-25 DIAGNOSIS — I482 Chronic atrial fibrillation, unspecified: Secondary | ICD-10-CM

## 2015-01-25 DIAGNOSIS — J438 Other emphysema: Secondary | ICD-10-CM

## 2015-01-25 DIAGNOSIS — I10 Essential (primary) hypertension: Secondary | ICD-10-CM | POA: Diagnosis not present

## 2015-01-25 NOTE — Progress Notes (Signed)
Patient ID: Craig [name], male   DOB: 01/30/1945, 70 y.o.   MRN: [id number]    [facility]      No Known Allergies  Chief Complaint  Patient presents with  . Acute Visit    wound management     HPI:  I have been asked to see him for wound care. He has a wound from his abdominal abscess which is nearly resolved. The wound is being treated with medihoney on a daily basis. There are no signs of infection present. He is not having pain present.    Past Medical History  Diagnosis Date  . COPD (chronic obstructive pulmonary disease) (HCC)   . Hypertension   . Myocardial infarction (HCC)   . A-fib (HCC)   . GERD (gastroesophageal reflux disease)   . Hepatitis B 04/27/2013  . Dysphagia 04/27/2013  . Diverticulosis 03/05/2013  . Sepsis (HCC) 04/26/2013    Past Surgical History  Procedure Laterality Date  . Colostomy    . Peg tube placement    . Peg tube removal      VITAL SIGNS BP 122/70 mmHg  Pulse 76  Ht 5\' 10"  (1.778 m)  Wt 157 lb (71.215 kg)  BMI 22.53 kg/m2  SpO2 94%  Patient's Medications  New Prescriptions   No medications on file  Previous Medications   AMIODARONE (PACERONE) 200 MG TABLET    Take 200 mg by mouth daily.   ASPIRIN EC 81 MG TABLET    Take 81 mg by mouth daily.   CETIRIZINE (ZYRTEC) 10 MG TABLET    Take 10 mg by mouth daily as needed for allergies.    ESCITALOPRAM (LEXAPRO) 10 MG TABLET    Take 10 mg by mouth daily.   FLORA-Q (FLORA-Q) CAPS CAPSULE    Take 1 capsule by mouth daily.   FLUTICASONE FUROATE-VILANTEROL (BREO ELLIPTA) 100-25 MCG/INH AEPB    Inhale 1 puff into the lungs daily.    FOLIC ACID (FOLVITE) 1 MG TABLET    Take 1 mg by mouth daily.   HYDROXYZINE (ATARAX/VISTARIL) 25 MG TABLET    Take 25 mg by mouth every 6 (six) hours as needed for itching.   IRON POLYSACCHARIDES (NIFEREX) 150 MG CAPSULE    Take 1 capsule (150 mg total) by mouth daily.   K PHOS MONO-SOD PHOS DI & MONO (PHOSPHA 250 NEUTRAL PO)    Take 250 mg by mouth  2 (two) times daily.    LEVOTHYROXINE (SYNTHROID, LEVOTHROID) 75 MCG TABLET    Take 75 mcg by mouth daily before breakfast.   MELATONIN 3 MG TABS    Take 1 tablet by mouth at bedtime as needed (sleep).    MORPHINE (ROXANOL) 20 MG/ML CONCENTRATED SOLUTION    Take 5 mg by mouth every 2 (two) hours as needed for severe pain. AND 5 mg every 6 hours routinely   MULTIPLE VITAMINS-MINERALS (DECUBI-VITE PO)    Take 1 capsule by mouth daily.   OMEPRAZOLE (PRILOSEC) 20 MG CAPSULE    Take 20 mg by mouth daily.   PROMETHAZINE (PHENERGAN) 25 MG TABLET    Take 25 mg by mouth every 6 (six) hours as needed for nausea or vomiting.   THIAMINE 100 MG TABLET    Take 100 mg by mouth daily.  Modified Medications   No medications on file  Discontinued Medications     SIGNIFICANT DIAGNOSTIC EXAMS   12-16-13: chest x-ray; right lower lobe atelectasis  08-26-14: s/p foley placement: Placement of 12 French suprapubic  bladder drainage catheter. Urine return was cloudy and a sample of urine was sent for urinalysis and culture.  10-20-14: kub: non-specific gas pattern.   10-21-14: ct of abdomen and pelvis: 1. Focal collection of fluid and trace air at the anterior left mid abdominal wall, measuring 8.1 x 2.1 x 5.3 cm, with peripheral enhancement, compatible with an abscess. This appears to be at the prior site of a G-tube placement, as the stomach is tamped up to the abdominal wall underlying the abscess. This may still be contiguous with the stomach. 2. Prominent bronchiectasis at the lower lung lobes, with patchy bibasilar airspace opacities. This may reflect an atypical infectious process. Aspiration cannot be excluded. Trace right pleural effusion seen. 3. Left lower quadrant colostomy site is unremarkable in appearance. Hartmann's pouch is within normal limits. 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 Mild diverticulosis along the distal transverse, descending and  proximal sigmoid colon, without evidence of diverticulitis. 4. Small bilateral renal cysts seen. 5. Diffuse calcification along the abdominal aorta and its branches. 6. 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     LABS REVIEWED:    12-28-13: glucose 87; bun 20; creat 1.5; k+4.7; na++140 02-08-14: tsh 0.372  04-26-14: glucose 81; bun 17; creat 1.33; k+4.7; na++139; phos 4.5; psa 1.24 08-14-14: wbc 8.8; hgb 8.2; hct 28.5; mcv 71.8; plt 250; glucose 72; bun 13; creat 1.13; k+ 4.7; na++140; liver normal albumin 3.2; phos 3.3; mag 1.6; tsh 0.298  08-26-14: wbc 11.0; hgb 7.7; hct 27.1; mcv 74.5. ;plt 297; glucose 124; bun 19; creat 1.21; k+3.6; na++141; liver normal albumin 2.9; urine culture: gram neg rods.  10-02-14: tsh 0.236  10-21-14 :wbc 15.7; hgb 7.6; hct 25.4; mcv 67.9; plt 402; glucose 87; bun 19; creat 1.14; k+ 3.8; na++135; liver normal albumin 2.7; blood culture X2: no growth; HIV; nr 10-22-14: wbc 8.2; hgb 7.0; hct 23.1; mcv 67.7; plt 365; glucose 83; bun 11; creat 1.06; k+ 3.6; n++139; tsh 0.088 10-23-14: urine culture: klebsiella pneumonia proteus mirabilis: septra 10-25-14; wbc 9.8; hgb 9.8; hct 31.3; mcv 72.8; plt 396; glucose 72; bun 9; creat 0.85; k+ 3.7; na++139; tsh 0.267 12-08-14: tsh 0.195; free T4: 1.62 (normal)     Review of Systems Constitutional: Negative for appetite change and fatigue.  HENT: Negative for congestion.   Respiratory: Negative for cough, chest tightness and shortness of breath.   Cardiovascular: Negative for chest pain, palpitations and leg swelling.  Gastrointestinal: Negative for nausea, abdominal pain, diarrhea and constipation.  Musculoskeletal: Negative for myalgias and arthralgias.  Skin: Negative for pallor.has sore on stomach  Psychiatric/Behavioral: The patient is not nervous/anxious.      Physical Exam Constitutional: frail. No distress.  Eyes:  Conjunctivae are normal.  Neck: Neck supple. No JVD present. No thyromegaly present.  Cardiovascular: Normal rate, regular rhythm and intact distal pulses.   Respiratory: Effort normal and breath sounds normal. No respiratory distress. He has no wheezes.  GI: Soft. Bowel sounds are normal. He exhibits no distension. There is no tenderness.  Has left lower quad colostomy   Has s/p foley  Musculoskeletal: He exhibits no edema.  Able to move upper extremities Does not move lower extremities    Lymphadenopathy:    He has no cervical adenopathy.  Neurological: He is alert.  Skin: Skin is warm and dry. He is not diaphoretic.  Abdominal wound: nearly resolved 0.4  x 3.8 cm superficial red wound bed without signs of infection present.  Psychiatric: He has a normal mood and affect     ASSESSMENT/ PLAN:  1. Abdominal wall abscess: will continue current treatment of medihoney on a daily basis. Will continue to monitor his status.   2. Protein calorie malnutrition: his albumin is 2.7. Will continue supplements per facility protocol and will monitor his status.    Time spent with patient 30   minutes >50% time spent counseling; reviewing medical record; tests; labs; and developing future plan of care   Synthia Innocent NP [facility]  Contact [phone] Monday through Friday 8am- 5pm  After hours call [phone]

## 2015-02-22 ENCOUNTER — Non-Acute Institutional Stay (SKILLED_NURSING_FACILITY): Payer: Medicare Other | Admitting: Adult Health

## 2015-02-22 DIAGNOSIS — J438 Other emphysema: Secondary | ICD-10-CM

## 2015-02-22 DIAGNOSIS — I482 Chronic atrial fibrillation, unspecified: Secondary | ICD-10-CM

## 2015-02-22 DIAGNOSIS — E038 Other specified hypothyroidism: Secondary | ICD-10-CM

## 2015-02-22 DIAGNOSIS — E43 Unspecified severe protein-calorie malnutrition: Secondary | ICD-10-CM | POA: Diagnosis not present

## 2015-02-22 DIAGNOSIS — D638 Anemia in other chronic diseases classified elsewhere: Secondary | ICD-10-CM

## 2015-02-22 DIAGNOSIS — E034 Atrophy of thyroid (acquired): Secondary | ICD-10-CM

## 2015-02-22 DIAGNOSIS — I10 Essential (primary) hypertension: Secondary | ICD-10-CM

## 2015-02-22 DIAGNOSIS — R339 Retention of urine, unspecified: Secondary | ICD-10-CM

## 2015-02-22 DIAGNOSIS — G8929 Other chronic pain: Secondary | ICD-10-CM | POA: Diagnosis not present

## 2015-02-26 ENCOUNTER — Non-Acute Institutional Stay (SKILLED_NURSING_FACILITY): Payer: Medicare Other | Admitting: Adult Health

## 2015-02-26 ENCOUNTER — Encounter: Payer: Self-pay | Admitting: Adult Health

## 2015-02-26 DIAGNOSIS — K567 Ileus, unspecified: Secondary | ICD-10-CM

## 2015-02-26 DIAGNOSIS — R112 Nausea with vomiting, unspecified: Secondary | ICD-10-CM

## 2015-02-26 LAB — BASIC METABOLIC PANEL
BUN: 36 mg/dL — AB (ref 4–21)
Creatinine: 1.3 mg/dL (ref <=1.3)
GLUCOSE: 66 mg/dL
Potassium: 5.1 mmol/L (ref 3.4–5.3)
SODIUM: 134 mmol/L — AB (ref 137–147)

## 2015-02-26 LAB — HEPATIC FUNCTION PANEL: Bilirubin, Total: 0.5 mg/dL

## 2015-02-26 LAB — CBC AND DIFFERENTIAL: WBC: 13.4 10^3/mL

## 2015-02-26 NOTE — Progress Notes (Signed)
Patient ID: Craig [name], male   DOB: May 01, 1945, 70 y.o.   MRN: [id number]  Facility: [facility]       No Known Allergies  Chief Complaint  Patient presents with  . Acute Visit    Nausea and Vomiting    HPI:  Staff reports that he has been having n/v for the past 48 hours. There are no reports of fever present. He does complain of abdominal pain. He states he has no appetite.    Past Medical History  Diagnosis Date  . COPD (chronic obstructive pulmonary disease) (HCC)   . Hypertension   . Myocardial infarction (HCC)   . A-fib (HCC)   . GERD (gastroesophageal reflux disease)   . Hepatitis B 04/27/2013  . Dysphagia 04/27/2013  . Diverticulosis 03/05/2013  . Sepsis (HCC) 04/26/2013    Past Surgical History  Procedure Laterality Date  . Colostomy    . Peg tube placement    . Peg tube removal      VITAL SIGNS BP 148/71 mmHg  Pulse 104  Temp(Src) 97.7 F (36.5 C) (Oral)  Resp 20  Ht  (1.778 m)  Wt 157 lb (71.215 kg)  BMI 22.53 kg/m2  SpO2 90%  Patient's Medications  New Prescriptions   No medications on file  Previous Medications   AMIODARONE (PACERONE) 200 MG TABLET    Take 200 mg by mouth daily.   ASPIRIN EC 81 MG TABLET    Take 81 mg by mouth daily.   CETIRIZINE (ZYRTEC) 10 MG TABLET    Take 10 mg by mouth daily as needed for allergies.    ESCITALOPRAM (LEXAPRO) 10 MG TABLET    Take 10 mg by mouth daily.   FLUTICASONE FUROATE-VILANTEROL (BREO ELLIPTA) 100-25 MCG/INH AEPB    Inhale 1 puff into the lungs daily.    FOLIC ACID (FOLVITE) 1 MG TABLET    Take 1 mg by mouth daily.   HYDROXYZINE (ATARAX/VISTARIL) 25 MG TABLET    Take 25 mg by mouth every 6 (six) hours as needed for itching.   IRON POLYSACCHARIDES (NIFEREX) 150 MG CAPSULE    Take 1 capsule (150 mg total) by mouth daily.   K PHOS MONO-SOD PHOS DI & MONO (PHOSPHA 250 NEUTRAL PO)    Take 250 mg by mouth 2 (two) times daily.    LAMOTRIGINE (LAMICTAL) 25 MG TABLET    Take 25 mg by mouth daily. 25  mg po qd x 7 then increase to 50 mg po every day after   LEVOTHYROXINE (SYNTHROID, LEVOTHROID) 50 MCG TABLET    Take 50 mcg by mouth daily.   LEVOTHYROXINE (SYNTHROID, LEVOTHROID) 75 MCG TABLET    Take 75 mcg by mouth daily before breakfast.   MORPHINE SULFATE (MORPHINE CONCENTRATE) 10 MG / 0.5 ML CONCENTRATED SOLUTION    Take 5 mg by mouth every 4 (four) hours as needed for severe pain.   MULTIPLE VITAMINS-MINERALS (DECUBI-VITE PO)    Take 2 capsules by mouth daily.    OMEPRAZOLE (PRILOSEC) 20 MG CAPSULE    Take 20 mg by mouth daily.   PROMETHAZINE (PHENERGAN) 25 MG TABLET    Take 25 mg by mouth every 6 (six) hours as needed for nausea or vomiting.   THIAMINE 100 MG TABLET    Take 100 mg by mouth daily.  Modified Medications   No medications on file  Discontinued Medications   FLORA-Q (FLORA-Q) CAPS CAPSULE    Take 1 capsule by mouth daily. Reported on 02/26/2015  MELATONIN 3 MG TABS    Take 1 tablet by mouth at bedtime as needed (sleep). Reported on 02/26/2015   MORPHINE (ROXANOL) 20 MG/ML CONCENTRATED SOLUTION    Take 5 mg by mouth every 2 (two) hours as needed for severe pain. Reported on 02/26/2015     SIGNIFICANT DIAGNOSTIC EXAMS   12-16-13: chest x-ray; right lower lobe atelectasis  08-26-14: s/p foley placement: Placement of 12 French suprapubic bladder drainage catheter. Urine return was cloudy and a sample of urine was sent for urinalysis and culture.  10-20-14: kub: non-specific gas pattern.   10-21-14: ct of abdomen and pelvis: 1. Focal collection of fluid and trace air at the anterior left mid abdominal wall, measuring 8.1 x 2.1 x 5.3 cm, with peripheral enhancement, compatible with an abscess. This appears to be at the prior site of a G-tube placement, as the stomach is tamped up to the abdominal wall underlying the abscess. This may still be contiguous with the stomach. 2. Prominent bronchiectasis at the lower lung lobes, with patchy bibasilar airspace opacities. This may  reflect an atypical infectious process. Aspiration cannot be excluded. Trace right pleural effusion seen. 3. Left lower quadrant colostomy site is unremarkable in appearance. Hartmann's pouch is within normal limits. 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 Mild diverticulosis along the distal transverse, descending and proximal sigmoid colon, without evidence of diverticulitis. 4. Small bilateral renal cysts seen. 5. Diffuse calcification along the abdominal aorta and its branches. 6. 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  02-26-15: kub: mild ileus    LABS REVIEWED:    04-26-14: glucose 81; bun 17; creat 1.33; k+4.7; na++139; phos 4.5; psa 1.24 08-14-14: wbc 8.8; hgb 8.2; hct 28.5; mcv 71.8; plt 250; glucose 72; bun 13; creat 1.13; k+ 4.7; na++140; liver normal albumin 3.2; phos 3.3; mag 1.6; tsh 0.298  08-26-14: wbc 11.0; hgb 7.7; hct 27.1; mcv 74.5. ;plt 297; glucose 124; bun 19; creat 1.21; k+3.6; na++141; liver normal albumin 2.9; urine culture: gram neg rods.  10-02-14: tsh 0.236  10-21-14 :wbc 15.7; hgb 7.6; hct 25.4; mcv 67.9; plt 402; glucose 87; bun 19; creat 1.14; k+ 3.8; na++135; liver normal albumin 2.7; blood culture X2: no growth; HIV; nr 10-22-14: wbc 8.2; hgb 7.0; hct 23.1; mcv 67.7; plt 365; glucose 83; bun 11; creat 1.06; k+ 3.6; n++139; tsh 0.088 10-23-14: urine culture: klebsiella pneumonia proteus mirabilis: septra 10-25-14; wbc 9.8; hgb 9.8; hct 31.3; mcv 72.8; plt 396; glucose 72; bun 9; creat 0.85; k+ 3.7; na++139; tsh 0.267 12-08-14: tsh 0.195; free T4: 1.62 (normal)     Review of Systems Constitutional: Negative for appetite change and fatigue.  HENT: Negative for congestion.   Respiratory: Negative for cough, chest tightness and shortness of breath.   Cardiovascular: Negative for chest pain, palpitations and leg swelling.   Gastrointestinal: nausea vomiting pain  Musculoskeletal: Negative for myalgias and arthralgias.  Skin: Negative for pallor. Psychiatric/Behavioral: The patient is not nervous/anxious.      Physical Exam Constitutional: frail. No distress.  Eyes: Conjunctivae are normal.  Neck: Neck supple. No JVD present. No thyromegaly present.  Cardiovascular: Normal rate, regular rhythm and intact distal pulses.   Respiratory: Effort normal and breath sounds normal. No respiratory distress. He has no wheezes.  GI: slightly distended; bowel sounds very hypoactive; and tenderness present generalized  Has left lower quad colostomy   Has  s/p foley  Musculoskeletal: He exhibits no edema.  Able to move upper extremities Does not move lower extremities    Lymphadenopathy:    He has no cervical adenopathy.  Neurological: He is alert.  Skin: Skin is warm and dry. He is not diaphoretic.  Abdominal wound: nearly resolved   Psychiatric: He has a normal mood and affect     ASSESSMENT/ PLAN:   1. Nausea/vomiting 2. Mild ileus Will give him MOM 60 cc now and will begin him on miralax 17 gm daily       Time spent with patient  45   minutes >50% time spent counseling; reviewing medical record; tests; labs; and developing future plan of care       Synthia Innocent NP [facility]  Contact [phone] Monday through Friday 8am- 5pm  After hours call [phone]

## 2015-03-01 ENCOUNTER — Emergency Department (HOSPITAL_COMMUNITY): Payer: Medicare Other

## 2015-03-01 ENCOUNTER — Inpatient Hospital Stay (HOSPITAL_COMMUNITY)
Admission: EM | Admit: 2015-03-01 | Discharge: 2015-03-06 | DRG: 388 | Disposition: A | Discharge status: Skilled Nursing Facility | Payer: Medicare Other | Attending: Internal Medicine | Admitting: Internal Medicine

## 2015-03-01 ENCOUNTER — Encounter: Payer: Self-pay | Admitting: Adult Health

## 2015-03-01 ENCOUNTER — Non-Acute Institutional Stay (SKILLED_NURSING_FACILITY): Payer: Medicare Other | Admitting: Adult Health

## 2015-03-01 ENCOUNTER — Encounter (HOSPITAL_COMMUNITY): Payer: Self-pay | Admitting: *Deleted

## 2015-03-01 DIAGNOSIS — N390 Urinary tract infection, site not specified: Secondary | ICD-10-CM | POA: Diagnosis present

## 2015-03-01 DIAGNOSIS — R112 Nausea with vomiting, unspecified: Secondary | ICD-10-CM | POA: Insufficient documentation

## 2015-03-01 DIAGNOSIS — Z66 Do not resuscitate: Secondary | ICD-10-CM | POA: Diagnosis present

## 2015-03-01 DIAGNOSIS — D509 Iron deficiency anemia, unspecified: Secondary | ICD-10-CM | POA: Diagnosis present

## 2015-03-01 DIAGNOSIS — Z515 Encounter for palliative care: Secondary | ICD-10-CM | POA: Insufficient documentation

## 2015-03-01 DIAGNOSIS — E43 Unspecified severe protein-calorie malnutrition: Secondary | ICD-10-CM | POA: Diagnosis present

## 2015-03-01 DIAGNOSIS — I70202 Unspecified atherosclerosis of native arteries of extremities, left leg: Secondary | ICD-10-CM | POA: Diagnosis present

## 2015-03-01 DIAGNOSIS — K567 Ileus, unspecified: Secondary | ICD-10-CM | POA: Insufficient documentation

## 2015-03-01 DIAGNOSIS — Z0189 Encounter for other specified special examinations: Secondary | ICD-10-CM

## 2015-03-01 DIAGNOSIS — Z933 Colostomy status: Secondary | ICD-10-CM

## 2015-03-01 DIAGNOSIS — F32A Depression, unspecified: Secondary | ICD-10-CM | POA: Diagnosis present

## 2015-03-01 DIAGNOSIS — E039 Hypothyroidism, unspecified: Secondary | ICD-10-CM | POA: Diagnosis present

## 2015-03-01 DIAGNOSIS — N179 Acute kidney failure, unspecified: Secondary | ICD-10-CM | POA: Diagnosis present

## 2015-03-01 DIAGNOSIS — K5669 Other intestinal obstruction: Secondary | ICD-10-CM

## 2015-03-01 DIAGNOSIS — T83510A Infection and inflammatory reaction due to cystostomy catheter, initial encounter: Secondary | ICD-10-CM | POA: Diagnosis present

## 2015-03-01 DIAGNOSIS — I480 Paroxysmal atrial fibrillation: Secondary | ICD-10-CM | POA: Diagnosis present

## 2015-03-01 DIAGNOSIS — J438 Other emphysema: Secondary | ICD-10-CM

## 2015-03-01 DIAGNOSIS — I10 Essential (primary) hypertension: Secondary | ICD-10-CM | POA: Diagnosis present

## 2015-03-01 DIAGNOSIS — Z7401 Bed confinement status: Secondary | ICD-10-CM | POA: Diagnosis not present

## 2015-03-01 DIAGNOSIS — Z681 Body mass index (BMI) 19 or less, adult: Secondary | ICD-10-CM | POA: Diagnosis not present

## 2015-03-01 DIAGNOSIS — Z7901 Long term (current) use of anticoagulants: Secondary | ICD-10-CM

## 2015-03-01 DIAGNOSIS — B958 Unspecified staphylococcus as the cause of diseases classified elsewhere: Secondary | ICD-10-CM | POA: Diagnosis present

## 2015-03-01 DIAGNOSIS — L8990 Pressure ulcer of unspecified site, unspecified stage: Secondary | ICD-10-CM | POA: Insufficient documentation

## 2015-03-01 DIAGNOSIS — E038 Other specified hypothyroidism: Secondary | ICD-10-CM

## 2015-03-01 DIAGNOSIS — J449 Chronic obstructive pulmonary disease, unspecified: Secondary | ICD-10-CM | POA: Diagnosis present

## 2015-03-01 DIAGNOSIS — K56609 Unspecified intestinal obstruction, unspecified as to partial versus complete obstruction: Secondary | ICD-10-CM

## 2015-03-01 DIAGNOSIS — K219 Gastro-esophageal reflux disease without esophagitis: Secondary | ICD-10-CM | POA: Diagnosis present

## 2015-03-01 DIAGNOSIS — F329 Major depressive disorder, single episode, unspecified: Secondary | ICD-10-CM | POA: Diagnosis present

## 2015-03-01 DIAGNOSIS — Y846 Urinary catheterization as the cause of abnormal reaction of the patient, or of later complication, without mention of misadventure at the time of the procedure: Secondary | ICD-10-CM | POA: Diagnosis present

## 2015-03-01 DIAGNOSIS — K56600 Partial intestinal obstruction, unspecified as to cause: Secondary | ICD-10-CM | POA: Insufficient documentation

## 2015-03-01 DIAGNOSIS — D72829 Elevated white blood cell count, unspecified: Secondary | ICD-10-CM

## 2015-03-01 DIAGNOSIS — I482 Chronic atrial fibrillation: Secondary | ICD-10-CM

## 2015-03-01 DIAGNOSIS — I4891 Unspecified atrial fibrillation: Secondary | ICD-10-CM | POA: Diagnosis present

## 2015-03-01 DIAGNOSIS — E860 Dehydration: Secondary | ICD-10-CM | POA: Diagnosis present

## 2015-03-01 DIAGNOSIS — Z87891 Personal history of nicotine dependence: Secondary | ICD-10-CM

## 2015-03-01 DIAGNOSIS — Z993 Dependence on wheelchair: Secondary | ICD-10-CM

## 2015-03-01 DIAGNOSIS — E034 Atrophy of thyroid (acquired): Secondary | ICD-10-CM

## 2015-03-01 DIAGNOSIS — R627 Adult failure to thrive: Secondary | ICD-10-CM | POA: Diagnosis present

## 2015-03-01 DIAGNOSIS — I252 Old myocardial infarction: Secondary | ICD-10-CM | POA: Diagnosis not present

## 2015-03-01 DIAGNOSIS — K565 Intestinal adhesions [bands] with obstruction (postprocedural) (postinfection): Secondary | ICD-10-CM | POA: Diagnosis present

## 2015-03-01 DIAGNOSIS — Z8249 Family history of ischemic heart disease and other diseases of the circulatory system: Secondary | ICD-10-CM

## 2015-03-01 DIAGNOSIS — Z4659 Encounter for fitting and adjustment of other gastrointestinal appliance and device: Secondary | ICD-10-CM

## 2015-03-01 DIAGNOSIS — I739 Peripheral vascular disease, unspecified: Secondary | ICD-10-CM | POA: Diagnosis present

## 2015-03-01 DIAGNOSIS — J42 Unspecified chronic bronchitis: Secondary | ICD-10-CM | POA: Diagnosis not present

## 2015-03-01 DIAGNOSIS — D638 Anemia in other chronic diseases classified elsewhere: Secondary | ICD-10-CM | POA: Diagnosis present

## 2015-03-01 DIAGNOSIS — I2510 Atherosclerotic heart disease of native coronary artery without angina pectoris: Secondary | ICD-10-CM | POA: Diagnosis present

## 2015-03-01 HISTORY — DX: Depression, unspecified: F32.A

## 2015-03-01 HISTORY — DX: Retention of urine, unspecified: R33.9

## 2015-03-01 HISTORY — DX: Disorder of thyroid, unspecified: E07.9

## 2015-03-01 HISTORY — DX: Alcoholic cirrhosis of liver without ascites: K70.30

## 2015-03-01 HISTORY — DX: Major depressive disorder, single episode, unspecified: F32.9

## 2015-03-01 HISTORY — DX: Atherosclerotic heart disease of native coronary artery without angina pectoris: I25.10

## 2015-03-01 LAB — CBC WITH DIFFERENTIAL/PLATELET
Basophils Absolute: 0 10*3/uL (ref 0.0–0.1)
Basophils Relative: 0 %
EOS PCT: 2 %
Eosinophils Absolute: 0.3 10*3/uL (ref 0.0–0.7)
HCT: 33.8 % — ABNORMAL LOW (ref 39.0–52.0)
HEMOGLOBIN: 10.4 g/dL — AB (ref 13.0–17.0)
LYMPHS ABS: 2.2 10*3/uL (ref 0.7–4.0)
Lymphocytes Relative: 14 %
MCH: 22 pg — ABNORMAL LOW (ref 26.0–34.0)
MCHC: 30.8 g/dL (ref 30.0–36.0)
MCV: 71.5 fL — ABNORMAL LOW (ref 78.0–100.0)
MONOS PCT: 10 %
Monocytes Absolute: 1.6 10*3/uL — ABNORMAL HIGH (ref 0.1–1.0)
NEUTROS ABS: 11.9 10*3/uL — AB (ref 1.7–7.7)
Neutrophils Relative %: 74 %
Platelets: 335 10*3/uL (ref 150–400)
RBC: 4.73 MIL/uL (ref 4.22–5.81)
RDW: 17.4 % — AB (ref 11.5–15.5)
WBC: 16 10*3/uL — ABNORMAL HIGH (ref 4.0–10.5)

## 2015-03-01 LAB — COMPREHENSIVE METABOLIC PANEL
ALT: 11 U/L — AB (ref 17–63)
AST: 13 U/L — ABNORMAL LOW (ref 15–41)
Albumin: 2.8 g/dL — ABNORMAL LOW (ref 3.5–5.0)
Alkaline Phosphatase: 78 U/L (ref 38–126)
Anion gap: 11 (ref 5–15)
BUN: 35 mg/dL — AB (ref 6–20)
CHLORIDE: 95 mmol/L — AB (ref 101–111)
CO2: 30 mmol/L (ref 22–32)
CREATININE: 1.41 mg/dL — AB (ref 0.61–1.24)
Calcium: 9 mg/dL (ref 8.9–10.3)
GFR calc Af Amer: 58 mL/min — ABNORMAL LOW (ref >60)
GFR, EST NON AFRICAN AMERICAN: 50 mL/min — AB (ref >60)
Glucose, Bld: 99 mg/dL (ref 65–99)
Potassium: 3.5 mmol/L (ref 3.5–5.1)
SODIUM: 136 mmol/L (ref 135–145)
Total Bilirubin: 0.5 mg/dL (ref 0.3–1.2)
Total Protein: 8.4 g/dL — ABNORMAL HIGH (ref 6.5–8.1)

## 2015-03-01 LAB — CBC AND DIFFERENTIAL: WBC: 16.2 10^3/mL

## 2015-03-01 LAB — LIPASE, BLOOD: LIPASE: 32 U/L (ref 11–51)

## 2015-03-01 MED ORDER — ASPIRIN EC 81 MG PO TBEC
81.0000 mg | DELAYED_RELEASE_TABLET | Freq: Every day | ORAL | Status: DC
Start: 2015-03-02 — End: 2015-03-06
  Administered 2015-03-02 – 2015-03-06 (×3): 81 mg via ORAL
  Filled 2015-03-01 (×3): qty 1

## 2015-03-01 MED ORDER — HEPARIN SODIUM (PORCINE) 5000 UNIT/ML IJ SOLN
5000.0000 [IU] | Freq: Three times a day (TID) | INTRAMUSCULAR | Status: DC
  Administered 2015-03-02 – 2015-03-05 (×10): 5000 [IU] via SUBCUTANEOUS
  Filled 2015-03-01 (×9): qty 1

## 2015-03-01 MED ORDER — FLUTICASONE FUROATE-VILANTEROL 100-25 MCG/INH IN AEPB: 1.0000 | INHALATION_SPRAY | Freq: Every day | RESPIRATORY_TRACT | Status: DC

## 2015-03-01 MED ORDER — IOHEXOL 300 MG/ML  SOLN
100.0000 mL | Freq: Once | INTRAMUSCULAR | Status: AC | PRN
  Administered 2015-03-01: 100 mL via INTRAVENOUS

## 2015-03-01 MED ORDER — LAMOTRIGINE 25 MG PO TABS
25.0000 mg | ORAL_TABLET | Freq: Every day | ORAL | Status: DC
Start: 2015-03-02 — End: 2015-03-06
  Administered 2015-03-02 – 2015-03-06 (×3): 25 mg via ORAL
  Filled 2015-03-01 (×3): qty 1

## 2015-03-01 MED ORDER — ACETAMINOPHEN 650 MG RE SUPP: 650.0000 mg | Freq: Four times a day (QID) | RECTAL | Status: DC | PRN

## 2015-03-01 MED ORDER — SODIUM CHLORIDE 0.9 % IV SOLN
INTRAVENOUS | Status: DC
  Administered 2015-03-02 (×3): via INTRAVENOUS

## 2015-03-01 MED ORDER — LEVOTHYROXINE SODIUM 100 MCG IV SOLR
37.5000 ug | Freq: Every day | INTRAVENOUS | Status: DC
  Administered 2015-03-02 – 2015-03-06 (×4): 37.5 ug via INTRAVENOUS
  Filled 2015-03-01 (×4): qty 5

## 2015-03-01 MED ORDER — ONDANSETRON HCL 4 MG PO TABS: 4.0000 mg | ORAL_TABLET | Freq: Four times a day (QID) | ORAL | Status: DC | PRN

## 2015-03-01 MED ORDER — ACETAMINOPHEN 325 MG PO TABS
650.0000 mg | ORAL_TABLET | Freq: Four times a day (QID) | ORAL | Status: DC | PRN
Start: 2015-03-01 — End: 2015-03-06

## 2015-03-01 MED ORDER — ONDANSETRON HCL 4 MG/2ML IJ SOLN: 4.0000 mg | Freq: Four times a day (QID) | INTRAMUSCULAR | Status: DC | PRN

## 2015-03-01 MED ORDER — AMIODARONE HCL 200 MG PO TABS
200.0000 mg | ORAL_TABLET | Freq: Every day | ORAL | Status: DC
Start: 2015-03-02 — End: 2015-03-06
  Administered 2015-03-02 – 2015-03-06 (×3): 200 mg via ORAL
  Filled 2015-03-01 (×3): qty 1

## 2015-03-01 MED ORDER — SODIUM CHLORIDE 0.9 % IV SOLN
Freq: Once | INTRAVENOUS | Status: AC
  Administered 2015-03-01: 17:00:00 via INTRAVENOUS

## 2015-03-01 MED ORDER — ESCITALOPRAM OXALATE 10 MG PO TABS
10.0000 mg | ORAL_TABLET | Freq: Every day | ORAL | Status: DC
  Administered 2015-03-02 – 2015-03-06 (×3): 10 mg via ORAL
  Filled 2015-03-01 (×3): qty 1

## 2015-03-01 MED ORDER — MORPHINE SULFATE (PF) 2 MG/ML IV SOLN
1.0000 mg | INTRAVENOUS | Status: DC | PRN
  Administered 2015-03-03 – 2015-03-05 (×2): 1 mg via INTRAVENOUS
  Filled 2015-03-01 (×2): qty 1

## 2015-03-01 MED ORDER — GUAIFENESIN-DM 100-10 MG/5ML PO SYRP: 5.0000 mL | ORAL_SOLUTION | ORAL | Status: DC | PRN

## 2015-03-01 MED ORDER — ONDANSETRON HCL 4 MG/2ML IJ SOLN
4.0000 mg | Freq: Once | INTRAMUSCULAR | Status: AC
  Administered 2015-03-01: 4 mg via INTRAVENOUS
  Filled 2015-03-01: qty 2

## 2015-03-01 MED ORDER — PANTOPRAZOLE SODIUM 40 MG IV SOLR
40.0000 mg | INTRAVENOUS | Status: DC
Start: 2015-03-01 — End: 2015-03-06
  Administered 2015-03-02 – 2015-03-05 (×5): 40 mg via INTRAVENOUS
  Filled 2015-03-01 (×5): qty 40

## 2015-03-01 MED ORDER — ALBUTEROL SULFATE (2.5 MG/3ML) 0.083% IN NEBU: 2.5000 mg | INHALATION_SOLUTION | RESPIRATORY_TRACT | Status: DC | PRN

## 2015-03-01 MED ORDER — SODIUM CHLORIDE 0.9 % IV BOLUS (SEPSIS)
500.0000 mL | Freq: Once | INTRAVENOUS | Status: AC
  Administered 2015-03-01: 500 mL via INTRAVENOUS

## 2015-03-01 NOTE — ED Provider Notes (Signed)
CSN: [id number]     Arrival date & time 03/01/15  1335 History   First MD Initiated Contact with Patient 03/01/15 1338     Chief Complaint  Patient presents with  . Abdominal Pain     (Consider location/radiation/quality/duration/timing/severity/associated sxs/prior Treatment) The history is provided by the patient and medical records.     Pt with hx COPD, CAD s/p MI, Afib, Hep B, cirrhosis, urinary retention s/p suprapubic catheter, LLQ colostomy p/w 1 week of abdominal pain, N/V, decreased appetite.  Pt reports intermittent pain in the left abdomen that "feels like someone sticking (him)"   Sent from facility where KUB suggested SBO. Pt denies fevers, chills, myalgias, CP, SOB, known change in output of foley bag or colostomy.    Past Medical History  Diagnosis Date  . COPD (chronic obstructive pulmonary disease) (HCC)   . Hypertension   . Myocardial infarction (HCC)   . A-fib (HCC)   . GERD (gastroesophageal reflux disease)   . Hepatitis B 04/27/2013  . Dysphagia 04/27/2013  . Diverticulosis 03/05/2013  . Sepsis (HCC) 04/26/2013  . Alcoholic cirrhosis of liver without ascites (HCC)   . Coronary artery disease   . Thyroid disease   . Depression   . Urinary retention    Past Surgical History  Procedure Laterality Date  . Colostomy    . Peg tube placement    . Peg tube removal     History reviewed. No pertinent family history. Social History  Substance Use Topics  . Smoking status: Former Games developer  . Smokeless tobacco: None  . Alcohol Use: No    Review of Systems  All other systems reviewed and are negative.     Allergies  Review of patient's allergies indicates no known allergies.  Home Medications   Prior to Admission medications   Medication Sig Start Date End Date Taking? Authorizing Provider  amiodarone (PACERONE) 200 MG tablet Take 200 mg by mouth daily.    Historical Provider, MD  aspirin EC 81 MG tablet Take 81 mg by mouth daily.    Historical Provider,  MD  cetirizine (ZYRTEC) 10 MG tablet Take 10 mg by mouth daily as needed for allergies.     Historical Provider, MD  escitalopram (LEXAPRO) 10 MG tablet Take 10 mg by mouth daily.    Historical Provider, MD  Fluticasone Furoate-Vilanterol (BREO ELLIPTA) 100-25 MCG/INH AEPB Inhale 1 puff into the lungs daily.     Historical Provider, MD  folic acid (FOLVITE) 1 MG tablet Take 1 mg by mouth daily.    Historical Provider, MD  hydrOXYzine (ATARAX/VISTARIL) 25 MG tablet Take 25 mg by mouth every 6 (six) hours as needed for itching.    Historical Provider, MD  iron polysaccharides (NIFEREX) 150 MG capsule Take 1 capsule (150 mg total) by mouth daily. 10/23/14   Vassie Loll, MD  K Phos Mono-Sod Phos Di & Mono (PHOSPHA 250 NEUTRAL PO) Take 250 mg by mouth 2 (two) times daily.     Historical Provider, MD  lamoTRIgine (LAMICTAL) 25 MG tablet Take 25 mg by mouth daily. 25 mg po qd x 7 then increase to 50 mg po every day after    Historical Provider, MD  levothyroxine (SYNTHROID, LEVOTHROID) 50 MCG tablet Take 50 mcg by mouth daily.    Historical Provider, MD  levothyroxine (SYNTHROID, LEVOTHROID) 75 MCG tablet Take 75 mcg by mouth daily before breakfast.    Historical Provider, MD  Morphine Sulfate (MORPHINE CONCENTRATE) 10 mg / 0.5 ml concentrated solution  Take 5 mg by mouth every 4 (four) hours as needed for severe pain.    Historical Provider, MD  Multiple Vitamins-Minerals (DECUBI-VITE PO) Take 2 capsules by mouth daily.     Historical Provider, MD  omeprazole (PRILOSEC) 20 MG capsule Take 20 mg by mouth daily.    Historical Provider, MD  promethazine (PHENERGAN) 25 MG tablet Take 25 mg by mouth every 6 (six) hours as needed for nausea or vomiting.    Historical Provider, MD  thiamine 100 MG tablet Take 100 mg by mouth daily.    Historical Provider, MD   BP 127/68 mmHg  Pulse 106  Temp(Src) 98.9 F (37.2 C) (Oral)  Resp 18  Wt 71.215 kg  SpO2 97% Physical Exam  Constitutional: No distress.  Thin,  frail with protuberant abdomen  HENT:  Head: Normocephalic and atraumatic.  Neck: Neck supple.  Cardiovascular: Normal rate and regular rhythm.   Pulmonary/Chest: Effort normal and breath sounds normal. No respiratory distress. He has no wheezes. He has no rales.  Abdominal: Soft. He exhibits distension. There is tenderness. There is no rebound and no guarding.    Neurological: He is alert. He exhibits normal muscle tone.  Skin: He is not diaphoretic.  Nursing note and vitals reviewed.   ED Course  Procedures (including critical care time) Labs Review Labs Reviewed  COMPREHENSIVE METABOLIC PANEL - Abnormal; Notable for the following:    Chloride 95 (*)    BUN 35 (*)    Creatinine, Ser 1.41 (*)    Total Protein 8.4 (*)    Albumin 2.8 (*)    AST 13 (*)    ALT 11 (*)    GFR calc non Af Amer 50 (*)    GFR calc Af Amer 58 (*)    All other components within normal limits  CBC WITH DIFFERENTIAL/PLATELET - Abnormal; Notable for the following:    WBC 16.0 (*)    Hemoglobin 10.4 (*)    HCT 33.8 (*)    MCV 71.5 (*)    MCH 22.0 (*)    RDW 17.4 (*)    Neutro Abs 11.9 (*)    Monocytes Absolute 1.6 (*)    All other components within normal limits  URINE CULTURE  LIPASE, BLOOD  URINALYSIS, ROUTINE W REFLEX MICROSCOPIC (NOT AT [facility])    Imaging Review Ct Abdomen Pelvis W Contrast  03/01/2015  CLINICAL DATA:  70 year old male with 1 week history of left flank pain an 8 prior history of small bowel obstruction. EXAM: CT ABDOMEN AND PELVIS WITH CONTRAST TECHNIQUE: Multidetector CT imaging of the abdomen and pelvis was performed using the standard protocol following bolus administration of intravenous contrast. CONTRAST:  OMNIPAQUE IOHEXOL 300 MG/ML  SOLN COMPARISON:  Prior CT abdomen/pelvis 10/21/2014 FINDINGS: Lower Chest: Bilateral lower lobe bronchiectasis with partial bronchial impaction, atelectasis and pleural parenchymal scarring. Trace tree-in-bud micro nodularity in the  posterior aspect of the right middle lobe. Findings are similar compared to prior imaging. Visualized cardiac structures within normal limits for size. No pericardial effusion. Unremarkable visualized distal thoracic esophagus. Abdomen: Unremarkable CT appearance of the stomach, duodenum, spleen, adrenal glands and pancreas. Normal hepatic contour and morphology. No discrete hepatic lesion. Portal veins are patent. Gallbladder is unremarkable. No intra or extrahepatic biliary ductal dilatation. Unremarkable appearance of the bilateral kidneys. No focal solid lesion, hydronephrosis or nephrolithiasis. 2.2 cm simple cyst in the interpolar right kidney. Multiple loops of abnormal dilated and fluid-filled small bowel throughout the abdomen. There is a parastomal  hernia about the left lower quadrant colonic abscess containing a short-segment of the dilated small bowel, however this does not appear to be the source of the obstruction is the bowel is dilated on both sides of this segment. There is a focal transition point to normal caliber bowel in the right hemi abdomen (image 47 series 201) at a region of focal angulation suggesting underlying adhesions. The bowel wall mucosa enhances. No evidence of ischemic complication, perforation or free air. No significant mesenteric edema. No free fluid. Colonic diverticular disease without CT evidence of active inflammation. Prior sigmoid resection with Hartmann's pouch and left lower quadrant diverting ostomy. The Hartmann's pouch appears unremarkable. Colonic diverticular disease without CT evidence of active inflammation. Pelvis: Suprapubic catheter. No evidence of leak or complication. Unremarkable prostate gland. Musculoskeletal: No acute fracture or aggressive appearing lytic or blastic osseous lesion. Vascular: Extensive atherosclerotic vascular calcifications. No evidence of aneurysm. Focal high-grade stenosis of the left common iliac artery secondary to calcified  atherosclerotic plaque. 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 Bulky calcified plaque results in at least moderate narrowing of the left common femoral artery. IMPRESSION: 1. Positive for small bowel obstruction with a focal transition points in the right lower quadrant likely secondary to underlying adhesions. No evidence of significant inflammation, bowel ischemic change or perforation. 2. Surgical changes of prior sigmoidectomy with Hartmann's pouch and left lower quadrant diverting colostomy. 3. Parastomal hernia containing omental fat and a short segment of small bowel. No evidence of mechanical obstruction at this location. 4. Bilateral lower lobe bronchiectasis with areas of partial bronchial impaction, atelectasis and pleural parenchymal scarring. 5. Diverticulosis of the residual left colon without evidence of active diverticulitis. 6. 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 Does the patient have clinical symptoms of left lower extremity claudication? Electronically Signed   By: Malachy Moan M.D.   On: 03/01/2015 16:42   I have personally reviewed and evaluated these images and lab results as part of my medical decision-making.   EKG Interpretation   Date/Time:  Thursday March 01 2015 17:27:33 EST Ventricular Rate:  103 PR Interval:  133 QRS Duration: 103 QT Interval:  357 QTC Calculation: 467 R Axis:   71 Text Interpretation:  Sinus tachycardia Borderline repolarization  abnormality Nonspecific ST and T wave abnormality v3 and v4 Confirmed by  Manus Gunning  MD, STEPHEN [phone]) on 03/01/2015 5:38:42 PM       5:21 PM [facility] to admit.    MDM   Final diagnoses:  SBO (small bowel obstruction) (HCC)  AKI (acute kidney injury) (HCC)    Afebrile patient with multiple medical problems in nursing facility p/w 1 week of abdominal  pain, distension, N/V.  Has colostomy and suprapubic cath.  Found to be dehydrated with SBO with transition point in RLQ.  Admitted to [facility], general surgery to consult.     Trixie Dredge, PA-C 03/01/15 1959  Laurence Spates, MD 03/04/15 [phone]

## 2015-03-01 NOTE — H&P (Signed)
PATIENT DETAILS Name: Craig [name] Age: 70 y.o. Sex: male Date of Birth: [birth year] Admit Date: 03/01/2015 WUJ:[id number], Craig Glenn, DO Referring PROVIDER:[facility]   CHIEF COMPLAINT:  Abdominal pain for a week Poor appetite/generalized weakness-2-3 days  HPI: Craig [name] is a 70 y.o. 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 Please note-patient is not reliable historian, most of this history is obtained after speaking with the patient, reviewing chart . Per patient he has been having pain for the past 1 week in his lower abdomen. He denies any vomiting. Pain is described as colicky, expressed that at its worst. No radiation of the pain. No particular lifting or relieving factors. For the past few days, he has had exceedingly poor appetite and has felt much will be given his usual self. Abdominal x-ray done at his skilled nursing facility showed a possible bowel obstruction, patient was subsequently transferred to [facility] and I was asked to admit this patient for further evaluation and treatment. Patient's colostomy was last changed yesterday-there has been no output since then, although it is filled with gas. Patient denies any fever, headache, chest pain or shortness of breath.  ALLERGIES:  No Known Allergies  PAST MEDICAL HISTORY: Past Medical History  Diagnosis Date  . COPD (chronic obstructive pulmonary disease) (HCC)   . Hypertension   . Myocardial infarction (HCC)   . A-fib (HCC)   . GERD (gastroesophageal reflux disease)   . Hepatitis B 04/27/2013  . Dysphagia 04/27/2013  . Diverticulosis 03/05/2013  . Sepsis (HCC) 04/26/2013  . Alcoholic cirrhosis of liver without ascites (HCC)   . Coronary  artery disease   . Thyroid disease   . Depression   . Urinary retention     PAST SURGICAL HISTORY: Past Surgical History  Procedure Laterality Date  . Colostomy    . Peg tube placement    . Peg tube removal      MEDICATIONS AT HOME: Prior to Admission medications   Medication Sig Start Date End Date Taking? Authorizing Provider  amiodarone (PACERONE) 200 MG tablet Take 200 mg by mouth daily.   Yes Historical Provider, MD  aspirin EC 81 MG tablet Take 81 mg by mouth daily.   Yes Historical Provider, MD  cetirizine (ZYRTEC) 10 MG tablet Take 10 mg by mouth daily as needed for allergies.    Yes Historical Provider, MD  escitalopram (LEXAPRO) 10 MG tablet Take 10 mg by mouth daily.   Yes Historical Provider, MD  Fluticasone Furoate-Vilanterol (BREO ELLIPTA) 100-25 MCG/INH AEPB Inhale 1 puff into the lungs daily.    Yes Historical Provider, MD  folic acid (FOLVITE) 1 MG tablet Take 1 mg by mouth daily.   Yes Historical Provider, MD  hydrOXYzine (ATARAX/VISTARIL) 25 MG tablet Take 25 mg by mouth every 6 (six) hours as needed for itching.   Yes Historical Provider, MD  iron polysaccharides (NIFEREX) 150 MG capsule Take 1 capsule (150 mg total) by mouth daily. 10/23/14  Yes Vassie Loll, MD  K Phos Mono-Sod Phos Judi Cong & Mono (PHOSPHA 250 NEUTRAL PO) Take 250 mg by mouth 2 (two) times daily.    Yes Historical Provider, MD  lamoTRIgine (LAMICTAL) 25 MG tablet Take 25 mg by mouth daily. 25 mg po qd x 7 then increase  to 50 mg po every day after   Yes Historical Provider, MD  levothyroxine (SYNTHROID, LEVOTHROID) 75 MCG tablet Take 75 mcg by mouth daily before breakfast.   Yes Historical Provider, MD  Melatonin 3 MG TABS Take 1 tablet by mouth at bedtime.   Yes Historical Provider, MD  Morphine Sulfate (MORPHINE CONCENTRATE) 10 mg / 0.5 ml concentrated solution Take 5 mg by mouth every 4 (four) hours as needed for severe pain.   Yes Historical Provider, MD  Multiple Vitamins-Minerals (DECUBI-VITE PO)  Take 2 capsules by mouth daily.    Yes Historical Provider, MD  Nutritional Supplements (COMPLETE PROTEIN/VITAMIN SHAKE PO) Take 1 Dose by mouth 2 (two) times daily.   Yes Historical Provider, MD  omeprazole (PRILOSEC) 20 MG capsule Take 20 mg by mouth daily.   Yes Historical Provider, MD  polyethylene glycol (MIRALAX / GLYCOLAX) packet Take 17 g by mouth daily.   Yes Historical Provider, MD  promethazine (PHENERGAN) 25 MG tablet Take 25 mg by mouth every 6 (six) hours as needed for nausea or vomiting.   Yes Historical Provider, MD  thiamine 100 MG tablet Take 100 mg by mouth daily.   Yes Historical Provider, MD  levothyroxine (SYNTHROID, LEVOTHROID) 50 MCG tablet Take 50 mcg by mouth daily.    Historical Provider, MD    FAMILY HISTORY: Family history of CAD  SOCIAL HISTORY:  reports that he has quit smoking. He does not have any smokeless tobacco history on file. He reports that he does not drink alcohol or use illicit drugs. Lives at: SNF Mobility: Very minimally ambulatory-is mostly bedbound and wheelchair-bound.   REVIEW OF SYSTEMS:  Constitutional:   No  weight loss, night sweats,  Fevers, chills, fatigue.  HEENT:    No headaches, Dysphagia,Tooth/dental problems,Sore throat  Cardio-vascular: No chest pain,Orthopnea, PND,lower extremity edema, anasarca, palpitations  GI:  No heartburn, indigestion, diarrhea, melena or hematochezia  Resp: No shortness of breath, cough, hemoptysis,plueritic chest pain.   Skin:  No rash or lesions.  GU:  No dysuria, change in color of urine, no urgency or frequency.  No flank pain.  Musculoskeletal: No joint pain or swelling.  No decreased range of motion.  No back pain.  Endocrine: No heat intolerance, no cold intolerance, no polyuria, no polydipsia  Psych: No change in mood or affect. No depression or anxiety.  No memory loss.   PHYSICAL EXAM: Blood pressure 147/58, pulse 105, temperature 98.9 F (37.2 C), temperature source  Oral, resp. rate 16, weight 71.215 kg (157 lb), SpO2 88 %.  General appearance :Awake, alert, not in any distress. Speech Clear. Not toxic Looking HEENT: Atraumatic and Normocephalic, pupils equally reactive to light and accomodation Neck: supple, no JVD. No cervical lymphadenopathy.  Chest:Good air entry bilaterally, very few scattered bibasilar rales. CVS: S1 S2 regular, no murmurs.  Abdomen: Bowel sounds present, abdomen appears slightly distended, colostomy is present in the lower abdomen. There is numerous scars from prior laparotomies. There is only minimal tenderness in the lower abdomen without any rigidity or guarding. Colostomy-bag is in place-no output-filled with gas.  Extremities: B/L Lower Ext shows no edema, both of his lower legs appear extremely atrophic-+ left foot drop. Neurology:  Non focal-gen weakness. Left foot drop present. Skin:No Rash Wounds: Very limited exam-very weak-not able to cooperate-has evidence of prior sacral decubitus with scarring-did not see any obvious decubitus ulcers.  LABS ON ADMISSION:   Recent Labs  03/01/15 1420  NA 136  K 3.5  CL 95*  CO2 30  GLUCOSE 99  BUN 35*  CREATININE 1.41*  CALCIUM 9.0    Recent Labs  03/01/15 1420  AST 13*  ALT 11*  ALKPHOS 78  BILITOT 0.5  PROT 8.4*  ALBUMIN 2.8*    Recent Labs  03/01/15 1420  LIPASE 32    Recent Labs  03/01/15 1420  WBC 16.0*  NEUTROABS 11.9*  HGB 10.4*  HCT 33.8*  MCV 71.5*  PLT 335   No results for input(s): CKTOTAL, CKMB, CKMBINDEX, TROPONINI in the last 72 hours. No results for input(s): DDIMER in the last 72 hours. Invalid input(s): POCBNP   RADIOLOGIC STUDIES ON ADMISSION: Ct Abdomen Pelvis W Contrast  03/01/2015  CLINICAL DATA:  71 year old male with 1 week history of left flank pain an 8 prior history of small bowel obstruction. EXAM: CT ABDOMEN AND PELVIS WITH CONTRAST TECHNIQUE: Multidetector CT imaging of the abdomen and pelvis was performed using the  standard protocol following bolus administration of intravenous contrast. CONTRAST:  OMNIPAQUE IOHEXOL 300 MG/ML  SOLN COMPARISON:  Prior CT abdomen/pelvis 10/21/2014 FINDINGS: Lower Chest: Bilateral lower lobe bronchiectasis with partial bronchial impaction, atelectasis and pleural parenchymal scarring. Trace tree-in-bud micro nodularity in the posterior aspect of the right middle lobe. Findings are similar compared to prior imaging. Visualized cardiac structures within normal limits for size. No pericardial effusion. Unremarkable visualized distal thoracic esophagus. Abdomen: Unremarkable CT appearance of the stomach, duodenum, spleen, adrenal glands and pancreas. Normal hepatic contour and morphology. No discrete hepatic lesion. Portal veins are patent. Gallbladder is unremarkable. No intra or extrahepatic biliary ductal dilatation. Unremarkable appearance of the bilateral kidneys. No focal solid lesion, hydronephrosis or nephrolithiasis. 2.2 cm simple cyst in the interpolar right kidney. Multiple loops of abnormal dilated and fluid-filled small bowel throughout the abdomen. There is a parastomal hernia about the left lower quadrant colonic abscess containing a short-segment of the dilated small bowel, however this does not appear to be the source of the obstruction is the bowel is dilated on both sides of this segment. There is a focal transition point to normal caliber bowel in the right hemi abdomen (image 47 series 201) at a region of focal angulation suggesting underlying adhesions. The bowel wall mucosa enhances. No evidence of ischemic complication, perforation or free air. No significant mesenteric edema. No free fluid. Colonic diverticular disease without CT evidence of active inflammation. Prior sigmoid resection with Hartmann's pouch and left lower quadrant diverting ostomy. The Hartmann's pouch appears unremarkable. Colonic diverticular disease without CT evidence of active inflammation. Pelvis:  Suprapubic catheter. No evidence of leak or complication. Unremarkable prostate gland. Musculoskeletal: No acute fracture or aggressive appearing lytic or blastic osseous lesion. Vascular: Extensive atherosclerotic vascular calcifications. No evidence of aneurysm. 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 Bulky calcified plaque results in at least moderate narrowing of the left common femoral artery. IMPRESSION: 1. Positive for small bowel obstruction with a focal transition points in the right lower quadrant likely secondary to underlying adhesions. No evidence of significant inflammation, bowel ischemic change or perforation. 2. Surgical changes of prior sigmoidectomy with Hartmann's pouch and left lower quadrant diverting colostomy. 3. Parastomal hernia containing omental fat and a short segment of small bowel. No evidence of mechanical obstruction at this location. 4. Bilateral lower lobe bronchiectasis with areas of partial bronchial impaction, atelectasis and pleural parenchymal scarring. 5. Diverticulosis of the residual left colon without evidence of active diverticulitis. 6.  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 Does the patient have clinical symptoms of left lower extremity claudication? Electronically Signed   By: Malachy Moan M.D.   On: 03/01/2015 16:42    I have personally reviewed images of CT abdomen   EKG: Personally reviewed. EKG-sinus rhythm-artifacts  ASSESSMENT AND PLAN: Present on Admission:  . Bowel obstruction: No colostomy output for the past 24 hours-abdomen is distended but otherwise benign on exam. Keep nothing by mouth, IV fluids, NG tube if possible. General surgery consultation. Given multiple medical comorbidities/failure  to thrive syndrome-best served by conservative management.  Marland Kitchen AKI (acute kidney injury): Likely prerenal azotemia-from dehydration/poor oral intake. Hydrate and recheck electrolytes tomorrow morning.   . Leukocytosis: Appears chronically ill-looking-does not look toxic however. No obvious foci of infection evident on exam-check UA, chest x-ray, blood cultures. Since afebrile-and appears stable-monitor off antibiotics for now.  . Paroxysmal A-fib: Currently in sinus rhythm, very poor long-term candidate for anticoagulation. Continue aspirin.  Marland Kitchen Hypothyroidism: Change to IV levothyroxine-once oral intake stabilizes-convert to oral regimen.   Marland Kitchen GERD (gastroesophageal reflux disease): Continue PPI   . Depression: Continue usual medications.  Marland Kitchen COPD (chronic obstructive pulmonary disease): Continue bronchodilators, lungs are clear-this problem appears currently inactive at this time.   . Anemia of chronic disease: Has microcytic anemia-suspect that this is predominantly from chronic disease. Follow .  Marland Kitchen Peripheral vascular disease: CT of the abdomen incidentally demonstrates extensive atherosclerotic vascular calcifications including high-grade stenosis of the left common iliac artery, moderate stenosis of the left femoral artery-suspect these are chronic issues. He has extensive muscle atrophy in his bilateral lower extremities. He is minimally ambulatory if it best-and mostly bed to wheelchair bound. Given poor overall condition/failure to thrive syndrome with protein calorie malnutrition-suspect best managed medically and likely not a candidate for aggressive care.   . Failure to thrive in adult/Protein-calorie malnutrition, severe : Check prealbumin levels-nutrition consult when oral intake more stable. PT evaluation. Very poor overall prognoses.  . Palliative care: Mostly bedbound-very frail patient with multiple medical comorbidities. Did have a positive evaluation last admission-DO NOT  RESUSCITATE already in place-this was reconfirmed by this M.D. today. Very poor overall prognoses, suspect best served by conservative management. If deteriorates or Aggressive care is required, will need involvement of palliative care services.  Further plan will depend as patient's clinical course evolves and further radiologic and laboratory data become available. Patient will be monitored closely.  Above noted plan was discussed with patient face to face at bedside, he was in agreement.   CONSULTS: Gen Surgery-Dr Janee Morn  DVT Prophylaxis: Prophylactic Heparin  Code Status: DNR  Disposition Plan:  Discharge back to SNF possibly in 2-3 days   Total time spent  55 minutes.Greater than 50% of this time was spent in counseling, explanation of diagnosis, planning of further management, and coordination of care.  [facility] Pager [phone]  If 7PM-7AM, please contact night-coverage [email or web address] Password [facility] 03/01/2015, 5:55 PM

## 2015-03-01 NOTE — Consult Note (Signed)
Reason for Consult:SBO Referring Physician: Oren [name]  Craig [name] is an 70 y.o. male.  HPI: Craig [name] has history of multiple medical problems including paroxysmal atrial fibrillation on anticoagulation. He is status post diverting colostomy for wounds in the past. He presented to the emergency department with approximately 1 week of lower abdominal pain and decreased ostomy output. CT scan demonstrates small bowel obstruction likely secondary to adhesions. I was asked to see him in consultation regarding this. Currently he denies abdominal pain.  Past Medical History  Diagnosis Date  . COPD (chronic obstructive pulmonary disease) ([place])   . Hypertension   . Myocardial infarction ([place])   . A-fib ([place])   . GERD (gastroesophageal reflux disease)   . Hepatitis B 04/27/2013  . Dysphagia 04/27/2013  . Diverticulosis 03/05/2013  . Sepsis ([place]) 04/26/2013  . Alcoholic cirrhosis of liver without ascites ([place])   . Coronary artery disease   . Thyroid disease   . Depression   . Urinary retention     Past Surgical History  Procedure Laterality Date  . Colostomy    . Peg tube placement    . Peg tube removal      History reviewed. No pertinent family history.  Social History:  reports that he has quit smoking. He does not have any smokeless tobacco history on file. He reports that he does not drink alcohol or use illicit drugs.  Allergies: No Known Allergies  Medications: Prior to Admission:  (Not in a hospital admission)  Results for orders placed or performed during the hospital encounter of 03/01/15 (from the past 48 hour(s))  Comprehensive metabolic panel     Status: Abnormal   Collection Time: 03/01/15  2:20 PM  Result Value Ref Range   Sodium 136 135 - 145 mmol/L   Potassium 3.5 3.5 - 5.1 mmol/L   Chloride 95 (L) 101 - 111 mmol/L   CO2 30 22 - 32 mmol/L   Glucose, Bld 99 65 - 99 mg/dL   BUN 35 (H) 6 - 20 mg/dL   Creatinine, Ser 1.41 (H) 0.61 - 1.24 mg/dL   Calcium 9.0  8.9 - 10.3 mg/dL   Total Protein 8.4 (H) 6.5 - 8.1 g/dL   Albumin 2.8 (L) 3.5 - 5.0 g/dL   AST 13 (L) 15 - 41 U/L   ALT 11 (L) 17 - 63 U/L   Alkaline Phosphatase 78 38 - 126 U/L   Total Bilirubin 0.5 0.3 - 1.2 mg/dL   GFR calc non Af Amer 50 (L) >60 mL/min   GFR calc Af Amer 58 (L) >60 mL/min    Comment: (NOTE) The eGFR has been calculated using the CKD EPI equation. This calculation has not been validated in all clinical situations. eGFR's persistently <60 mL/min signify possible Chronic Kidney Disease.    Anion gap 11 5 - 15  Lipase, blood     Status: None   Collection Time: 03/01/15  2:20 PM  Result Value Ref Range   Lipase 32 11 - 51 U/L  CBC with Differential     Status: Abnormal   Collection Time: 03/01/15  2:20 PM  Result Value Ref Range   WBC 16.0 (H) 4.0 - 10.5 K/uL   RBC 4.73 4.22 - 5.81 MIL/uL   Hemoglobin 10.4 (L) 13.0 - 17.0 g/dL   HCT 33.8 (L) 39.0 - 52.0 %   MCV 71.5 (L) 78.0 - 100.0 fL   MCH 22.0 (L) 26.0 - 34.0 pg   MCHC 30.8 30.0 - 36.0 g/dL  RDW 17.4 (H) 11.5 - 15.5 %   Platelets 335 150 - 400 K/uL   Neutrophils Relative % 74 %   Lymphocytes Relative 14 %   Monocytes Relative 10 %   Eosinophils Relative 2 %   Basophils Relative 0 %   Neutro Abs 11.9 (H) 1.7 - 7.7 K/uL   Lymphs Abs 2.2 0.7 - 4.0 K/uL   Monocytes Absolute 1.6 (H) 0.1 - 1.0 K/uL   Eosinophils Absolute 0.3 0.0 - 0.7 K/uL   Basophils Absolute 0.0 0.0 - 0.1 K/uL   RBC Morphology POLYCHROMASIA PRESENT    Smear Review LARGE PLATELETS PRESENT     Ct Abdomen Pelvis W Contrast  03/01/2015  CLINICAL DATA:  70 year old male with 1 week history of left flank pain an 8 prior history of small bowel obstruction. EXAM: CT ABDOMEN AND PELVIS WITH CONTRAST TECHNIQUE: Multidetector CT imaging of the abdomen and pelvis was performed using the standard protocol following bolus administration of intravenous contrast. CONTRAST:  116m OMNIPAQUE IOHEXOL 300 MG/ML  SOLN COMPARISON:  Prior CT abdomen/pelvis  10/21/2014 FINDINGS: Lower Chest: Bilateral lower lobe bronchiectasis with partial bronchial impaction, atelectasis and pleural parenchymal scarring. Trace tree-in-bud micro nodularity in the posterior aspect of the right middle lobe. Findings are similar compared to prior imaging. Visualized cardiac structures within normal limits for size. No pericardial effusion. Unremarkable visualized distal thoracic esophagus. Abdomen: Unremarkable CT appearance of the stomach, duodenum, spleen, adrenal glands and pancreas. Normal hepatic contour and morphology. No discrete hepatic lesion. Portal veins are patent. Gallbladder is unremarkable. No intra or extrahepatic biliary ductal dilatation. Unremarkable appearance of the bilateral kidneys. No focal solid lesion, hydronephrosis or nephrolithiasis. 2.2 cm simple cyst in the interpolar right kidney. Multiple loops of abnormal dilated and fluid-filled small bowel throughout the abdomen. There is a parastomal hernia about the left lower quadrant colonic abscess containing a short-segment of the dilated small bowel, however this does not appear to be the source of the obstruction is the bowel is dilated on both sides of this segment. There is a focal transition point to normal caliber bowel in the right hemi abdomen (image 47 series 201) at a region of focal angulation suggesting underlying adhesions. The bowel wall mucosa enhances. No evidence of ischemic complication, perforation or free air. No significant mesenteric edema. No free fluid. Colonic diverticular disease without CT evidence of active inflammation. Prior sigmoid resection with Hartmann's pouch and left lower quadrant diverting ostomy. The Hartmann's pouch appears unremarkable. Colonic diverticular disease without CT evidence of active inflammation. Pelvis: Suprapubic catheter. No evidence of leak or complication. Unremarkable prostate gland. Musculoskeletal: No acute fracture or aggressive appearing lytic or  blastic osseous lesion. Vascular: Extensive atherosclerotic vascular calcifications. No evidence of aneurysm. 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 Bulky calcified plaque results in at least moderate narrowing of the left common femoral artery. IMPRESSION: 1. Positive for small bowel obstruction with a focal transition points in the right lower quadrant likely secondary to underlying adhesions. No evidence of significant inflammation, bowel ischemic change or perforation. 2. Surgical changes of prior sigmoidectomy with Hartmann's pouch and left lower quadrant diverting colostomy. 3. Parastomal hernia containing omental fat and a short segment of small bowel. No evidence of mechanical obstruction at this location. 4. Bilateral lower lobe bronchiectasis with areas of partial bronchial impaction, atelectasis and pleural parenchymal scarring. 5. Diverticulosis of the residual left colon without evidence of active diverticulitis. 6.  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 Does the patient have clinical symptoms of left lower extremity claudication? Electronically Signed   By: Jacqulynn Cadet M.D.   On: 03/01/2015 16:42    Review of Systems  Constitutional: Negative for fever.  HENT: Negative.   Eyes: Negative.   Respiratory: Negative.   Cardiovascular: Negative for chest pain.  Gastrointestinal: Positive for nausea and abdominal pain. Negative for vomiting.  Genitourinary: Negative.   Musculoskeletal: Negative.   Skin: Negative.   Neurological: Negative.   Psychiatric/Behavioral: Negative.    Blood pressure 137/60, pulse 100, temperature 98.9 F (37.2 C), temperature source Oral, resp. rate 20, weight 71.215 kg (157 lb), SpO2 95 %. Physical Exam   Constitutional: He appears well-developed. No distress.  HENT:  Head: Normocephalic.  Nose: Nose normal.  Mouth/Throat: Oropharynx is clear and moist.  Eyes: EOM are normal. Pupils are equal, round, and reactive to light.  Neck: Neck supple. No tracheal deviation present.  Cardiovascular:  Irregularly irregular rhythm, 80s  Respiratory: Effort normal. No stridor. No respiratory distress. He has no wheezes. He has no rales.  GI: Soft. He exhibits distension. There is no tenderness. There is no rebound and no guarding.  Mild distention, no significant tenderness, air and liquid stool in colostomy  Neurological: He is alert.  Skin: Skin is warm.  Psychiatric: He has a normal mood and affect.    Assessment/Plan: SBO likely due to adhesions - now has air and liquid stool in his colostomy, agree with NG tube, will reevaluate in the morning in regards to stool output. We made proceed with our small bowel protocol at that time if indicated.  Lacie Landry E 03/01/2015, 9:47 PM

## 2015-03-01 NOTE — ED Notes (Signed)
Per [facility] - pt from [facility] facility, pt w/ c/o left abd pain x1 week, pt w/ KUB done today which showed "at least partial small bowel obstruction. Recommend CT." - denies n/v - pt w/ hx of SBO.

## 2015-03-01 NOTE — Progress Notes (Signed)
Patient ID: Craig [name], male   DOB: 1945-06-25, 70 y.o.   MRN: [id number]    Facility: [facility]       No Known Allergies  Chief Complaint  Patient presents with  . Medical Management of Chronic Issues    HPI:  He is a long term resident of this facility being seen for the management of his chronic illnesses. Overall there is little change in his status. He continues to decline weights and will not get out of bed. He spends his time with his head covered most of the time. He is wanting to home and wants the colostomy out and the s/p foley out. There are no nursing concerns at this time.    Past Medical History  Diagnosis Date  . COPD (chronic obstructive pulmonary disease) (HCC)   . Hypertension   . Myocardial infarction (HCC)   . A-fib (HCC)   . GERD (gastroesophageal reflux disease)   . Hepatitis B 04/27/2013  . Dysphagia 04/27/2013  . Diverticulosis 03/05/2013  . Sepsis (HCC) 04/26/2013    Past Surgical History  Procedure Laterality Date  . Colostomy    . Peg tube placement    . Peg tube removal      VITAL SIGNS BP 120/74 mmHg  Pulse 77  Ht  (1.778 m)  Wt 157 lb (71.215 kg)  BMI 22.53 kg/m2  SpO2 96%  Patient's Medications  New Prescriptions   No medications on file  Previous Medications   AMIODARONE (PACERONE) 200 MG TABLET    Take 200 mg by mouth daily.   ASPIRIN EC 81 MG TABLET    Take 81 mg by mouth daily.   CETIRIZINE (ZYRTEC) 10 MG TABLET    Take 10 mg by mouth daily as needed for allergies.    ESCITALOPRAM (LEXAPRO) 10 MG TABLET    Take 10 mg by mouth daily.   FLUTICASONE FUROATE-VILANTEROL (BREO ELLIPTA) 100-25 MCG/INH AEPB    Inhale 1 puff into the lungs daily.    FOLIC ACID (FOLVITE) 1 MG TABLET    Take 1 mg by mouth daily.   HYDROXYZINE (ATARAX/VISTARIL) 25 MG TABLET    Take 25 mg by mouth every 6 (six) hours as needed for itching.   IRON POLYSACCHARIDES (NIFEREX) 150 MG CAPSULE    Take 1 capsule (150 mg total) by mouth daily.   K  PHOS MONO-SOD PHOS DI & MONO (PHOSPHA 250 NEUTRAL PO)    Take 250 mg by mouth 2 (two) times daily.    LAMOTRIGINE (LAMICTAL) 25 MG TABLET    Take 25 mg by mouth daily. 25 mg po qd x 7 then increase to 50 mg po every day after   LEVOTHYROXINE (SYNTHROID, LEVOTHROID) 50 MCG TABLET    Take 50 mcg by mouth daily.   LEVOTHYROXINE (SYNTHROID, LEVOTHROID) 75 MCG TABLET    Take 75 mcg by mouth daily before breakfast.   MORPHINE SULFATE (MORPHINE CONCENTRATE) 10 MG / 0.5 ML CONCENTRATED SOLUTION    Take 5 mg by mouth every 4 (four) hours as needed for severe pain.   MULTIPLE VITAMINS-MINERALS (DECUBI-VITE PO)    Take 2 capsules by mouth daily.    OMEPRAZOLE (PRILOSEC) 20 MG CAPSULE    Take 20 mg by mouth daily.   PROMETHAZINE (PHENERGAN) 25 MG TABLET    Take 25 mg by mouth every 6 (six) hours as needed for nausea or vomiting.   THIAMINE 100 MG TABLET    Take 100 mg by mouth daily.  Modified Medications   No medications on file  Discontinued Medications   No medications on file     SIGNIFICANT DIAGNOSTIC EXAMS   12-16-13: chest x-ray; right lower lobe atelectasis  08-26-14: s/p foley placement: Placement of 12 French suprapubic bladder drainage catheter. Urine return was cloudy and a sample of urine was sent for urinalysis and culture.  10-20-14: kub: non-specific gas pattern.   10-21-14: ct of abdomen and pelvis: 1. Focal collection of fluid and trace air at the anterior left mid abdominal wall, measuring 8.1 x 2.1 x 5.3 cm, with peripheral enhancement, compatible with an abscess. This appears to be at the prior site of a G-tube placement, as the stomach is tamped up to the abdominal wall underlying the abscess. This may still be contiguous with the stomach. 2. Prominent bronchiectasis at the lower lung lobes, with patchy bibasilar airspace opacities. This may reflect an atypical infectious process. Aspiration cannot be excluded. Trace right pleural effusion seen. 3. Left lower quadrant colostomy  site is unremarkable in appearance. Hartmann's pouch is within normal limits. 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 Mild diverticulosis along the distal transverse, descending and proximal sigmoid colon, without evidence of diverticulitis. 4. Small bilateral renal cysts seen. 5. Diffuse calcification along the abdominal aorta and its branches. 6. 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     LABS REVIEWED:    12-28-13: glucose 87; bun 20; creat 1.5; k+4.7; na++140 02-08-14: tsh 0.372  04-26-14: glucose 81; bun 17; creat 1.33; k+4.7; na++139; phos 4.5; psa 1.24 08-14-14: wbc 8.8; hgb 8.2; hct 28.5; mcv 71.8; plt 250; glucose 72; bun 13; creat 1.13; k+ 4.7; na++140; liver normal albumin 3.2; phos 3.3; mag 1.6; tsh 0.298  08-26-14: wbc 11.0; hgb 7.7; hct 27.1; mcv 74.5. ;plt 297; glucose 124; bun 19; creat 1.21; k+3.6; na++141; liver normal albumin 2.9; urine culture: gram neg rods.  10-02-14: tsh 0.236  10-21-14 :wbc 15.7; hgb 7.6; hct 25.4; mcv 67.9; plt 402; glucose 87; bun 19; creat 1.14; k+ 3.8; na++135; liver normal albumin 2.7; blood culture X2: no growth; HIV; nr 10-22-14: wbc 8.2; hgb 7.0; hct 23.1; mcv 67.7; plt 365; glucose 83; bun 11; creat 1.06; k+ 3.6; n++139; tsh 0.088 10-23-14: urine culture: klebsiella pneumonia proteus mirabilis: septra 10-25-14; wbc 9.8; hgb 9.8; hct 31.3; mcv 72.8; plt 396; glucose 72; bun 9; creat 0.85; k+ 3.7; na++139; tsh 0.267 12-08-14: tsh 0.195; free T4: 1.62 (normal)     Review of Systems Constitutional: Negative for appetite change and fatigue.  HENT: Negative for congestion.   Respiratory: Negative for cough, chest tightness and shortness of breath.   Cardiovascular: Negative for chest pain, palpitations and leg swelling.  Gastrointestinal: Negative for nausea, abdominal pain, diarrhea and constipation.    Musculoskeletal: Negative for myalgias and arthralgias.  Skin: Negative for pallor.has sore on stomach  Psychiatric/Behavioral: The patient is not nervous/anxious.      Physical Exam Constitutional: frail. No distress.  Eyes: Conjunctivae are normal.  Neck: Neck supple. No JVD present. No thyromegaly present.  Cardiovascular: Normal rate, regular rhythm and intact distal pulses.   Respiratory: Effort normal and breath sounds normal. No respiratory distress. He has no wheezes.  GI: Soft. Bowel sounds are normal. He exhibits no distension. There is no tenderness.  Has left lower quad colostomy   Has s/p foley  Musculoskeletal: He exhibits no edema.  Able to move upper extremities Does not move lower extremities    Lymphadenopathy:    He has no cervical adenopathy.  Neurological: He is alert.  Skin: Skin is warm and dry. He is not diaphoretic.  Abdominal wound: nearly resolved 0.4 x 3.8 cm superficial red wound bed without signs of infection present.  Psychiatric: He has a normal mood and affect       ASSESSMENT/ PLAN:   1. Hypothyroidism: will continue synthroid 75 mcg daily;  tsh 0.195; free T4: 1.62   2. Weight loss:  Current weight is 157 pounds will continue to monitor his weight in Sept 2016 was 179 pounds.  He completed his remeron therapy.   3. Afib: his heart rate is stable; will continue amiodarone 200 mg daily for rate control; will continue asa 81 mg daily will monitor his status.   4. Genella Rife: will continue prilosec 20 mg daily  will monitor  5.  COPD: he is stable is 02 dependent; will continue breo ellipta 100/25 daily   6. Depression: is emotionally stable will will increase lexapro to 20 mg daily  daily; his weight is stable at 157 pounds    7. Constipation: is presently not on medications will monitor.  Is status post colostomy   8. Chronic pain:  will stop the roxanol at this time and will monitor his status; he has been declining this medication   9.  Insomnia: will continue melatonin 3 mg as needed at night  10. Urine retention: is status post suprapubic foley     Synthia Innocent NP [facility]  Contact [phone] Monday through Friday 8am- 5pm  After hours call [phone]

## 2015-03-01 NOTE — Progress Notes (Signed)
Patient ID: Craig [name], male   DOB: May 21, 1945, 70 y.o.   MRN: [id number]    Facility[facility]       No Known Allergies  Chief Complaint  Patient presents with  . Acute Visit    follow up ileus     HPI:  He is not doing any better. His nausea is not as intense; he continues to have a poor appetite. He his slightly dehydrated; his wbc was elevated on 02-27-15. His UA is positive for nitrates. There are no reports of fever present.    Past Medical History  Diagnosis Date  . COPD (chronic obstructive pulmonary disease) (HCC)   . Hypertension   . Myocardial infarction (HCC)   . A-fib (HCC)   . GERD (gastroesophageal reflux disease)   . Hepatitis B 04/27/2013  . Dysphagia 04/27/2013  . Diverticulosis 03/05/2013  . Sepsis (HCC) 04/26/2013    Past Surgical History  Procedure Laterality Date  . Colostomy    . Peg tube placement    . Peg tube removal      VITAL SIGNS BP 127/62 mmHg  Pulse 80  Temp(Src) 97.5 F (36.4 C)  Resp 18  Ht  (1.778 m)  Wt 157 lb (71.215 kg)  BMI 22.53 kg/m2  SpO2 97%  Patient's Medications  New Prescriptions   No medications on file  Previous Medications   AMIODARONE (PACERONE) 200 MG TABLET    Take 200 mg by mouth daily.   ASPIRIN EC 81 MG TABLET    Take 81 mg by mouth daily.   CETIRIZINE (ZYRTEC) 10 MG TABLET    Take 10 mg by mouth daily as needed for allergies.    ESCITALOPRAM (LEXAPRO) 10 MG TABLET    Take 10 mg by mouth daily.   FLUTICASONE FUROATE-VILANTEROL (BREO ELLIPTA) 100-25 MCG/INH AEPB    Inhale 1 puff into the lungs daily.    FOLIC ACID (FOLVITE) 1 MG TABLET    Take 1 mg by mouth daily.   HYDROXYZINE (ATARAX/VISTARIL) 25 MG TABLET    Take 25 mg by mouth every 6 (six) hours as needed for itching.   IRON POLYSACCHARIDES (NIFEREX) 150 MG CAPSULE    Take 1 capsule (150 mg total) by mouth daily.   K PHOS MONO-SOD PHOS DI & MONO (PHOSPHA 250 NEUTRAL PO)    Take 250 mg by mouth 2 (two) times daily.    LAMOTRIGINE  (LAMICTAL) 25 MG TABLET    Take 25 mg by mouth daily. 25 mg po qd x 7 then increase to 50 mg po every day after   LEVOTHYROXINE (SYNTHROID, LEVOTHROID) 50 MCG TABLET    Take 50 mcg by mouth daily.   LEVOTHYROXINE (SYNTHROID, LEVOTHROID) 75 MCG TABLET    Take 75 mcg by mouth daily before breakfast.   MORPHINE SULFATE (MORPHINE CONCENTRATE) 10 MG / 0.5 ML CONCENTRATED SOLUTION    Take 5 mg by mouth every 4 (four) hours as needed for severe pain.   MULTIPLE VITAMINS-MINERALS (DECUBI-VITE PO)    Take 2 capsules by mouth daily.    OMEPRAZOLE (PRILOSEC) 20 MG CAPSULE    Take 20 mg by mouth daily.   PROMETHAZINE (PHENERGAN) 25 MG TABLET    Take 25 mg by mouth every 6 (six) hours as needed for nausea or vomiting.   THIAMINE 100 MG TABLET    Take 100 mg by mouth daily.  Modified Medications   No medications on file  Discontinued Medications   No medications on file  SIGNIFICANT DIAGNOSTIC EXAMS   12-16-13: chest x-ray; right lower lobe atelectasis  08-26-14: s/p foley placement: Placement of 12 French suprapubic bladder drainage catheter. Urine return was cloudy and a sample of urine was sent for urinalysis and culture.  10-20-14: kub: non-specific gas pattern.   10-21-14: ct of abdomen and pelvis: 1. Focal collection of fluid and trace air at the anterior left mid abdominal wall, measuring 8.1 x 2.1 x 5.3 cm, with peripheral enhancement, compatible with an abscess. This appears to be at the prior site of a G-tube placement, as the stomach is tamped up to the abdominal wall underlying the abscess. This may still be contiguous with the stomach. 2. Prominent bronchiectasis at the lower lung lobes, with patchy bibasilar airspace opacities. This may reflect an atypical infectious process. Aspiration cannot be excluded. Trace right pleural effusion seen. 3. Left lower quadrant colostomy site is unremarkable in appearance. Hartmann's pouch is within normal limits. Herniation of a segment of ileum into  the colostomy hernia, without evidence for obstruction. Mild diverticulosis along the distal transverse, descending and proximal sigmoid colon, without evidence of diverticulitis. 4. Small bilateral renal cysts seen. 5. Diffuse calcification along the abdominal aorta and its branches. 6. 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  02-26-15: kub: mild ileus  03-01-15: KUB: at least a partial small bowel obstruction. Is worse.     LABS REVIEWED:    04-26-14: glucose 81; bun 17; creat 1.33; k+4.7; na++139; phos 4.5; psa 1.24 08-14-14: wbc 8.8; hgb 8.2; hct 28.5; mcv 71.8; plt 250; glucose 72; bun 13; creat 1.13; k+ 4.7; na++140; liver normal albumin 3.2; phos 3.3; mag 1.6; tsh 0.298  08-26-14: wbc 11.0; hgb 7.7; hct 27.1; mcv 74.5. ;plt 297; glucose 124; bun 19; creat 1.21; k+3.6; na++141; liver normal albumin 2.9; urine culture: gram neg rods.  10-02-14: tsh 0.236  10-21-14 :wbc 15.7; hgb 7.6; hct 25.4; mcv 67.9; plt 402; glucose 87; bun 19; creat 1.14; k+ 3.8; na++135; liver normal albumin 2.7; blood culture X2: no growth; HIV; nr 10-22-14: wbc 8.2; hgb 7.0; hct 23.1; mcv 67.7; plt 365; glucose 83; bun 11; creat 1.06; k+ 3.6; n++139; tsh 0.088 10-23-14: urine culture: klebsiella pneumonia proteus mirabilis: septra 10-25-14; wbc 9.8; hgb 9.8; hct 31.3; mcv 72.8; plt 396; glucose 72; bun 9; creat 0.85; k+ 3.7; na++139; tsh 0.267 12-08-14: tsh 0.195; free T4: 1.62 (normal)  02-26-15: wbc 13,4 hgb 10,6l hct 34.5 mcv 73.2; plt 336; glucose 66; bun 36; creat 1.34; k+ 5.1; na++ 134; liver normal albumin 3.4; amylase 61; lipase 24; U/A +     Review of Systems Constitutional: Negative for appetite change and fatigue.  HENT: Negative for congestion.   Respiratory: Negative for cough, chest tightness and shortness of breath.   Cardiovascular: Negative for chest pain, palpitations and leg swelling.    Gastrointestinal:no nausea vomiting or pain  Musculoskeletal: Negative for myalgias and arthralgias.  Skin: Negative for pallor. Psychiatric/Behavioral: The patient is not nervous/anxious.      Physical Exam Constitutional: frail. No distress.  Eyes: Conjunctivae are normal.  Neck: Neck supple. No JVD present. No thyromegaly present.  Cardiovascular: Normal rate, regular rhythm and intact distal pulses.   Respiratory: Effort normal and breath sounds normal. No respiratory distress. He has no wheezes.  GI: slightly less distended; bowel sounds  hypoactive; and tenderness present generalized  Has left lower quad colostomy  has gas and liquid stool in  bag Has s/p foley  Musculoskeletal: He exhibits no edema.  Able to move upper extremities Does not move lower extremities    Lymphadenopathy:    He has no cervical adenopathy.  Neurological: He is alert.  Skin: Skin is warm and dry. He is not diaphoretic.  Abdominal wound: nearly resolved   Psychiatric: He has a normal mood and affect     ASSESSMENT/ PLAN:  1. Dehydration:    2. Small bowel obstruction  Will send to the ED for further treatment options.     Time spent with patient  45  minutes >50% time spent counseling; reviewing medical record; tests; labs; and developing future plan of care   Synthia Innocent NP [facility]  Contact [phone] Monday through Friday 8am- 5pm  After hours call [phone]

## 2015-03-01 NOTE — Progress Notes (Signed)
Patient ID: Craig [name], male   DOB: 1945-09-20, 70 y.o.   MRN: [id number]   [facility]       No Known Allergies  Chief Complaint  Patient presents with  . Medical Management of Chronic Issues    HPI:  He is a long term resident of this facility being seen for the management of his chronic illnesses. Overall there is little change in his status. He complain of pain present in his lower abdomen and would like to have his roxanol restarted. He is wanting the s/p foley so he can urinate "normally". There are no nursing concerns at this time.    Past Medical History  Diagnosis Date  . COPD (chronic obstructive pulmonary disease) (HCC)   . Hypertension   . Myocardial infarction (HCC)   . A-fib (HCC)   . GERD (gastroesophageal reflux disease)   . Hepatitis B 04/27/2013  . Dysphagia 04/27/2013  . Diverticulosis 03/05/2013  . Sepsis (HCC) 04/26/2013    Past Surgical History  Procedure Laterality Date  . Colostomy    . Peg tube placement    . Peg tube removal      VITAL SIGNS BP 122/67 mmHg  Pulse 78  Ht  (1.778 m)  Wt 157 lb (71.215 kg)  BMI 22.53 kg/m2  SpO2 98%  Patient's Medications  New Prescriptions   No medications on file  Previous Medications   AMIODARONE (PACERONE) 200 MG TABLET    Take 200 mg by mouth daily.   ASPIRIN EC 81 MG TABLET    Take 81 mg by mouth daily.   CETIRIZINE (ZYRTEC) 10 MG TABLET    Take 10 mg by mouth daily as needed for allergies.    ESCITALOPRAM (LEXAPRO) 10 MG TABLET    Take 10 mg by mouth daily.   FLUTICASONE FUROATE-VILANTEROL (BREO ELLIPTA) 100-25 MCG/INH AEPB    Inhale 1 puff into the lungs daily.    FOLIC ACID (FOLVITE) 1 MG TABLET    Take 1 mg by mouth daily.   HYDROXYZINE (ATARAX/VISTARIL) 25 MG TABLET    Take 25 mg by mouth every 6 (six) hours as needed for itching.   IRON POLYSACCHARIDES (NIFEREX) 150 MG CAPSULE    Take 1 capsule (150 mg total) by mouth daily.   K PHOS MONO-SOD PHOS DI & MONO (PHOSPHA 250  NEUTRAL PO)    Take 250 mg by mouth 2 (two) times daily.    LAMOTRIGINE (LAMICTAL) 25 MG TABLET    Take 25 mg by mouth daily. 25 mg po qd x 7 then increase to 50 mg po every day after   LEVOTHYROXINE (SYNTHROID, LEVOTHROID) 50 MCG TABLET    Take 50 mcg by mouth daily.   LEVOTHYROXINE (SYNTHROID, LEVOTHROID) 75 MCG TABLET    Take 75 mcg by mouth daily before breakfast.   MULTIPLE VITAMINS-MINERALS (DECUBI-VITE PO)    Take 2 capsules by mouth daily.    OMEPRAZOLE (PRILOSEC) 20 MG CAPSULE    Take 20 mg by mouth daily.   PROMETHAZINE (PHENERGAN) 25 MG TABLET    Take 25 mg by mouth every 6 (six) hours as needed for nausea or vomiting.   THIAMINE 100 MG TABLET    Take 100 mg by mouth daily.  Modified Medications   No medications on file  Discontinued Medications   No medications on file     SIGNIFICANT DIAGNOSTIC EXAMS   12-16-13: chest x-ray; right lower lobe atelectasis  08-26-14: s/p foley placement: Placement of 12 [place]  suprapubic bladder drainage catheter. Urine return was cloudy and a sample of urine was sent for urinalysis and culture.  10-20-14: kub: non-specific gas pattern.   10-21-14: ct of abdomen and pelvis: 1. Focal collection of fluid and trace air at the anterior left mid abdominal wall, measuring 8.1 x 2.1 x 5.3 cm, with peripheral enhancement, compatible with an abscess. This appears to be at the prior site of a G-tube placement, as the stomach is tamped up to the abdominal wall underlying the abscess. This may still be contiguous with the stomach. 2. Prominent bronchiectasis at the lower lung lobes, with patchy bibasilar airspace opacities. This may reflect an atypical infectious process. Aspiration cannot be excluded. Trace right pleural effusion seen. 3. Left lower quadrant colostomy site is unremarkable in appearance. Hartmann's pouch is within normal limits. 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 Mild diverticulosis  along the distal transverse, descending and proximal sigmoid colon, without evidence of diverticulitis. 4. Small bilateral renal cysts seen. 5. Diffuse calcification along the abdominal aorta and its branches. 6. 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     LABS REVIEWED:    12-28-13: glucose 87; bun 20; creat 1.5; k+4.7; na++140 02-08-14: tsh 0.372  04-26-14: glucose 81; bun 17; creat 1.33; k+4.7; na++139; phos 4.5; psa 1.24 08-14-14: wbc 8.8; hgb 8.2; hct 28.5; mcv 71.8; plt 250; glucose 72; bun 13; creat 1.13; k+ 4.7; na++140; liver normal albumin 3.2; phos 3.3; mag 1.6; tsh 0.298  08-26-14: wbc 11.0; hgb 7.7; hct 27.1; mcv 74.5. ;plt 297; glucose 124; bun 19; creat 1.21; k+3.6; na++141; liver normal albumin 2.9; urine culture: gram neg rods.  10-02-14: tsh 0.236  10-21-14 :wbc 15.7; hgb 7.6; hct 25.4; mcv 67.9; plt 402; glucose 87; bun 19; creat 1.14; k+ 3.8; na++135; liver normal albumin 2.7; blood culture X2: no growth; HIV; nr 10-22-14: wbc 8.2; hgb 7.0; hct 23.1; mcv 67.7; plt 365; glucose 83; bun 11; creat 1.06; k+ 3.6; n++139; tsh 0.088 10-23-14: urine culture: klebsiella pneumonia proteus mirabilis: septra 10-25-14; wbc 9.8; hgb 9.8; hct 31.3; mcv 72.8; plt 396; glucose 72; bun 9; creat 0.85; k+ 3.7; na++139; tsh 0.267 12-08-14: tsh 0.195; free T4: 1.62 (normal)     Review of Systems Constitutional: Negative for appetite change and fatigue.  HENT: Negative for congestion.   Respiratory: Negative for cough, chest tightness and shortness of breath.   Cardiovascular: Negative for chest pain, palpitations and leg swelling.  Gastrointestinal: Negative for nausea, diarrhea and constipation. has supra pubic pain  Musculoskeletal: Negative for myalgias and arthralgias.  Skin: Negative for pallor. Psychiatric/Behavioral: The patient is not nervous/anxious.      Physical Exam Constitutional:  frail. No distress.  Eyes: Conjunctivae are normal.  Neck: Neck supple. No JVD present. No thyromegaly present.  Cardiovascular: Normal rate, regular rhythm and intact distal pulses.   Respiratory: Effort normal and breath sounds normal. No respiratory distress. He has no wheezes.  GI: Soft. Bowel sounds are normal. He exhibits no distension. There is no tenderness.  Has left lower quad colostomy   Has s/p foley  Musculoskeletal: He exhibits no edema.  Able to move upper extremities Does not move lower extremities    Lymphadenopathy:    He has no cervical adenopathy.  Neurological: He is alert.  Skin: Skin is warm and dry. He is not diaphoretic.  Psychiatric: He has a normal mood  and affect       ASSESSMENT/ PLAN:   1. Hypothyroidism: will continue synthroid 75 mcg daily;  tsh 0.195; free T4: 1.62   2. Weight loss:  Current weight is 157 pounds will continue to monitor his weight in Sept 2016 was 179 pounds.  He completed his remeron therapy.   3. Afib: his heart rate is stable; will continue amiodarone 200 mg daily for rate control; will continue asa 81 mg daily will monitor his status.   4. Genella Rife: will continue prilosec 20 mg daily  will monitor  5.  COPD: he is stable is 02 dependent; will continue breo ellipta 100/25 daily   6. Depression: is followed by psych services will continue lexapro at 10 mg daily  his weight is stable at 157 pounds    7. Constipation: is presently not on medications will monitor.  Is status post colostomy   8. Chronic pain:  will start roxanol 5 mg every 4 hours as needed and will monitor   9. Urine retention: is status post suprapubic foley          Synthia Innocent NP [facility]  Contact [phone] Monday through Friday 8am- 5pm  After hours call [phone]

## 2015-03-02 ENCOUNTER — Inpatient Hospital Stay (HOSPITAL_COMMUNITY): Payer: Medicare Other

## 2015-03-02 DIAGNOSIS — L8990 Pressure ulcer of unspecified site, unspecified stage: Secondary | ICD-10-CM | POA: Insufficient documentation

## 2015-03-02 DIAGNOSIS — N179 Acute kidney failure, unspecified: Secondary | ICD-10-CM

## 2015-03-02 DIAGNOSIS — I739 Peripheral vascular disease, unspecified: Secondary | ICD-10-CM

## 2015-03-02 DIAGNOSIS — D638 Anemia in other chronic diseases classified elsewhere: Secondary | ICD-10-CM

## 2015-03-02 LAB — URINALYSIS, ROUTINE W REFLEX MICROSCOPIC
BILIRUBIN URINE: NEGATIVE
Glucose, UA: NEGATIVE mg/dL
KETONES UR: 15 mg/dL — AB
NITRITE: POSITIVE — AB
PROTEIN: 30 mg/dL — AB
SPECIFIC GRAVITY, URINE: 1.027 (ref 1.005–1.030)
pH: 5 (ref 5.0–8.0)

## 2015-03-02 LAB — BASIC METABOLIC PANEL
ANION GAP: 8 (ref 5–15)
BUN: 33 mg/dL — ABNORMAL HIGH (ref 6–20)
CO2: 30 mmol/L (ref 22–32)
Calcium: 8.6 mg/dL — ABNORMAL LOW (ref 8.9–10.3)
Chloride: 101 mmol/L (ref 101–111)
Creatinine, Ser: 1.45 mg/dL — ABNORMAL HIGH (ref 0.61–1.24)
GFR, EST AFRICAN AMERICAN: 56 mL/min — AB (ref >60)
GFR, EST NON AFRICAN AMERICAN: 48 mL/min — AB (ref >60)
GLUCOSE: 78 mg/dL (ref 65–99)
POTASSIUM: 3.5 mmol/L (ref 3.5–5.1)
SODIUM: 139 mmol/L (ref 135–145)

## 2015-03-02 LAB — CBC
HEMATOCRIT: 30.2 % — AB (ref 39.0–52.0)
HEMOGLOBIN: 9.4 g/dL — AB (ref 13.0–17.0)
MCH: 22.8 pg — AB (ref 26.0–34.0)
MCHC: 31.1 g/dL (ref 30.0–36.0)
MCV: 73.3 fL — AB (ref 78.0–100.0)
Platelets: 310 10*3/uL (ref 150–400)
RBC: 4.12 MIL/uL — AB (ref 4.22–5.81)
RDW: 18.1 % — ABNORMAL HIGH (ref 11.5–15.5)
WBC: 20 10*3/uL — AB (ref 4.0–10.5)

## 2015-03-02 LAB — URINE MICROSCOPIC-ADD ON

## 2015-03-02 LAB — MRSA PCR SCREENING: MRSA BY PCR: NEGATIVE

## 2015-03-02 MED ORDER — POTASSIUM CHLORIDE 2 MEQ/ML IV SOLN
INTRAVENOUS | Status: DC
  Administered 2015-03-02 – 2015-03-05 (×4): via INTRAVENOUS
  Filled 2015-03-02 (×10): qty 1000

## 2015-03-02 MED ORDER — CHLORHEXIDINE GLUCONATE 0.12 % MT SOLN
15.0000 mL | Freq: Two times a day (BID) | OROMUCOSAL | Status: DC
  Administered 2015-03-02 – 2015-03-06 (×7): 15 mL via OROMUCOSAL
  Filled 2015-03-02 (×7): qty 15

## 2015-03-02 MED ORDER — VANCOMYCIN HCL 500 MG IV SOLR
500.0000 mg | Freq: Two times a day (BID) | INTRAVENOUS | Status: DC
  Administered 2015-03-03: 500 mg via INTRAVENOUS
  Filled 2015-03-02 (×2): qty 500

## 2015-03-02 MED ORDER — DEXTROSE 5 % IV SOLN
2.0000 g | INTRAVENOUS | Status: DC
  Administered 2015-03-02: 2 g via INTRAVENOUS
  Filled 2015-03-02 (×2): qty 2

## 2015-03-02 MED ORDER — MOMETASONE FURO-FORMOTEROL FUM 100-5 MCG/ACT IN AERO
2.0000 | INHALATION_SPRAY | Freq: Two times a day (BID) | RESPIRATORY_TRACT | Status: DC
  Administered 2015-03-03 – 2015-03-05 (×5): 2 via RESPIRATORY_TRACT
  Filled 2015-03-02 (×2): qty 8.8

## 2015-03-02 MED ORDER — SODIUM CHLORIDE 0.9 % IV SOLN
1250.0000 mg | Freq: Once | INTRAVENOUS | Status: AC
  Administered 2015-03-02: 1250 mg via INTRAVENOUS
  Filled 2015-03-02: qty 1250

## 2015-03-02 MED ORDER — CETYLPYRIDINIUM CHLORIDE 0.05 % MT LIQD
7.0000 mL | Freq: Two times a day (BID) | OROMUCOSAL | Status: DC
  Administered 2015-03-02 – 2015-03-06 (×5): 7 mL via OROMUCOSAL

## 2015-03-02 NOTE — Progress Notes (Signed)
Pharmacy Antibiotic Note  Craig [name] is a 70 y.o. male admitted on 03/01/2015 with UTI.  Pharmacy has been consulted for cefepime dosing.  UTI related to chronic indwelling suprapubic catheter  Pt growing GPC in 1/2 blood cx - f/u identification  Plan: Vancomycin 1250 mg IV x1 then 500 mg IV q12h Cefepime 2 gram q 24 hour  Monitor clinical progress, c/s, renal function, abx plan/LOT   Height: 6' 3.6" (192 cm) Weight: 152 lb 5.4 oz (69.1 kg) IBW/kg (Calculated) : 85.88  Temp (24hrs), Avg:98 F (36.7 C), Min:97.7 F (36.5 C), Max:98.3 F (36.8 C)   Recent Labs Lab 03/01/15 1420 03/02/15 0633  WBC 16.0* 20.0*  CREATININE 1.41* 1.45*    Estimated Creatinine Clearance: 47.7 mL/min (by C-G formula based on Cr of 1.45).    No Known Allergies  Antimicrobials this admission: 2/17 Cefepime >>   Dose adjustments this admission: none  Microbiology results: 2/17 BCx: sent 2/17 UCx: sent  2/17 MRSA PCR: negative   Baldem[place] Friday  03/02/2015 5:31 PM

## 2015-03-02 NOTE — Progress Notes (Signed)
PT Cancellation Note  Patient Details Name: Goebel Hellums MRN: [id number] DOB: 24-May-1945   Cancelled Treatment:    Reason Eval/Treat Not Completed: PT screened, no needs identified, will sign off (pt long term pt at nursing facility and has been bed bound for years.)   Jurrell Royster 03/02/2015, 3:19 PM [facility] PT [phone]

## 2015-03-02 NOTE — Progress Notes (Signed)
Lab call postive CRITICAL VALUE ALERT  Critical value received:  postive blood culture from aerobic bottle gram postive cocci in cluster.    Date of notification: 03/02/15  Time of notification:  1630  Critical value read back:Yes.    Nurse who received alert:  Forbes Cellar, RN  MD notified (1st page):  Dr. Jerral Ralph  Time of first page:  1633  MD notified (2nd page):  Time of second page:  Responding MD:  Dr. Jerral Ralph  Time MD responded:  [phone]

## 2015-03-02 NOTE — Progress Notes (Signed)
4W called to obtain an RN who can change suprapubic catheter.

## 2015-03-02 NOTE — Progress Notes (Signed)
Initial Nutrition Assessment  DOCUMENTATION CODES:   Severe malnutrition in context of chronic illness  INTERVENTION:   -RD will follow for diet advancement  NUTRITION DIAGNOSIS:   Malnutrition related to chronic illness as evidenced by severe depletion of body fat, severe depletion of muscle mass.  GOAL:   Patient will meet greater than or equal to 90% of their needs  MONITOR:   Diet advancement, Labs, Weight trends, Skin, I & O's  REASON FOR ASSESSMENT:   Consult Assessment of nutrition requirement/status  ASSESSMENT:   Craig [name] is a 70 y.o. 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 Please note-patient is not reliable historian, most of this history is obtained after speaking with the patient, reviewing chart . Per patient he has been having pain for the past 1 week in his lower abdomen. He denies any vomiting. Pain is described as colicky, expressed that at its worst. No radiation of the pain. No particular lifting or relieving factors. For the past few days, he has had exceedingly poor appetite and has felt much will be given his usual self. Abdominal x-ray done at his skilled nursing facility showed a possible bowel obstruction, patient was subsequently transferred to [facility] and I was asked to admit this patient for further evaluation and treatment.  Pt admitted with SBO.   Spoke with RN, who reveals that pt now has NGT to suction.   Pt lying in bed with blanket over his head. Per RN, this is pt's baseline. Pt would not respond to this RD's questions.   Wt hx reviewed, which revealed wt stability over the past 6 months.   Nutrition-Focused physical exam completed. Findings are severe fat  depletion, severe muscle depletion, and no edema.   Reviewed [facility] note from 03/02/15; pt with unstageable pressure injury to lt heel.   Labs reviewed.   Diet Order:  Diet NPO time specified Except for: Sips with Meds, Ice Chips  Skin:  Wound (see comment) (Unstageable lt heel)  Last BM:  03/02/15  Height:   Ht Readings from Last 1 Encounters:  03/01/15 6' 3.6" (1.92 m)    Weight:   Wt Readings from Last 1 Encounters:  03/01/15 152 lb 5.4 oz (69.1 kg)    Ideal Body Weight:  91.8 kg  BMI:  Body mass index is 18.74 kg/(m^2).  Estimated Nutritional Needs:   Kcal:  2000-2200  Protein:  105-120 grams  Fluid:  2.0-2.2 L  EDUCATION NEEDS:   No education needs identified at this time  Craig [name] A. Mayford Knife, RD, LDN, CDE Pager: [phone] After hours Pager: [phone]

## 2015-03-02 NOTE — Consult Note (Signed)
WOC wound consult note Reason for Consult: Consult requested for sacrum and heel wounds.  Pt also has a colostomy, according to the EMR.  He was in bed with a blanket over his head and adamantly refused to turn or allow anyone to assess his sacrum.   Wound type: Bedside nurse states that his sacrum was assessed earlier and there is no open wound, only white dry scar tissue from a previous wound which has healed at this time.   Unable to assess ostomy pouch related to refusal, pouching supplies ordered to room for staff nurse use. 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 Pressure Ulcer POA: Yes Dressing procedure/placement/frequency: Float heels to reduce pressure.  It is best practice to leave dry stable eschar in place. Barrier cream to protect scar tissue on sacrum. Please re-consult if further assistance is needed.  Thank-you,  Cammie Mcgee MSN, RN, CWOCN, [place], CNS [phone]

## 2015-03-02 NOTE — Progress Notes (Addendum)
Kriste Basque, RN at [facility] called to obtain suprapubic catheter size. Kriste Basque stated she could not find that answer documented in the chart.   14FR on current catheter

## 2015-03-02 NOTE — Progress Notes (Signed)
PATIENT DETAILS Name: Craig [name] Age: 70 y.o. Sex: male Date of Birth: 27-Apr-1945 Admit Date: 03/01/2015 Admitting Physician Dewayne Shorter Levora Dredge, MD ZOX:[id number], Maxine Glenn, DO  Brief narrative: 70 year old male-resident of a skilled nursing facility-with past medical history of a sacral decubiti ulcer (now healed) requiring a diverting colostomy, paroxysmal atrial fibrillation not on anticoagulation, chronic suprapubic catheter in place, failure to thrive syndrome-mostly bed to wheelchair bound presented to the hospital on 12/16 with bowel obstruction likely secondary to adhesions.  Subjective: Less abdominal pain today-significant output in colostomy.  Assessment/Plan: Bowel obstruction: Liquid stool in colostomy bag-abdomen is less distended. Improved with supportive measures, NG tube remains in place. Gen. surgery following, recommendations are to keep nothing by mouth  AKI (acute kidney injury): Likely prerenal azotemia-from dehydration/poor oral intake. Continue to hydrate, creatinine essentially unchanged.   UTI related to chronic indwelling suprapubic catheter: Complicated UTI, likely causing leukocytosis-start cefepime as at risk for drug-resistant organisms. Follow cultures and WBC count.  Paroxysmal A-fib: Currently in sinus rhythm, very poor long-term candidate for anticoagulation. Continue aspirin.  Hypothyroidism: Change to IV levothyroxine-once oral intake stabilizes-convert to oral regimen.   GERD (gastroesophageal reflux disease): Continue PPI   Depression: Continue usual medications.  COPD (chronic obstructive pulmonary disease): Continue bronchodilators, lungs are clear-this problem appears currently inactive at this time.   Anemia of chronic disease: Has chronic anemia at baseline-suspect that this is predominantly from chronic disease. Check iron panel and Follow .  Peripheral vascular disease: CT of the abdomen incidentally demonstrates  extensive atherosclerotic vascular calcifications including high-grade stenosis of the left common iliac artery, moderate stenosis of the left femoral artery-suspect these are chronic issues. He has extensive muscle atrophy in his bilateral lower extremities. He is minimally ambulatory if it best-and mostly bed to wheelchair bound. Furthermore, both lower extremities appear warm, dorsalis pedis pulses present. Given poor overall condition/failure to thrive syndrome with protein calorie malnutrition-suspect best managed medically and likely not a candidate for aggressive care.   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  Failure to thrive in adult/Protein-calorie malnutrition, severe : Check prealbumin levels-nutrition consult when oral intake more stable. PT evaluation. Very poor overall prognoses.   Palliative care: Mostly bedbound-very frail patient with multiple medical comorbidities. Did have a positive evaluation last admission-DO NOT RESUSCITATE already in place-this was reconfirmed by this M.D. today. Very poor overall prognoses, suspect best served by conservative management. If deteriorates or Aggressive care is required, will need involvement of palliative care services.  Disposition: Remain inpatient  Antimicrobial agents  See below  Anti-infectives    None      DVT Prophylaxis: Prophylactic Heparin   Code Status: Full code   Family Communication Cornel Werber [phone]  Procedures: None  CONSULTS:  general surgery  Time spent 25 minutes-Greater than 50% of this time was spent in counseling, explanation of diagnosis, planning of further management, and coordination of care.  MEDICATIONS: Scheduled Meds: . amiodarone  200 mg Oral Daily  . antiseptic oral rinse  7 mL Mouth Rinse q12n4p  . aspirin EC  81 mg Oral Daily  . chlorhexidine  15 mL Mouth Rinse BID  . escitalopram  10 mg Oral Daily  .  heparin  5,000 Units Subcutaneous 3 times per day  . lamoTRIgine  25 mg Oral Daily  . levothyroxine  37.5 mcg Intravenous Daily  . mometasone-formoterol  2 puff Inhalation BID  .  pantoprazole (PROTONIX) IV  40 mg Intravenous Q24H   Continuous Infusions: . sodium chloride 125 mL/hr at 03/02/15 1329   PRN Meds:.acetaminophen **OR** acetaminophen, albuterol, guaiFENesin-dextromethorphan, morphine injection, ondansetron **OR** ondansetron (ZOFRAN) IV    PHYSICAL EXAM: Vital signs in last 24 hours: Filed Vitals:   03/01/15 2215 03/01/15 2301 03/01/15 2304 03/02/15 0506  BP: 131/56  142/58 139/48  Pulse: 98  99 87  Temp:   98.3 F (36.8 C) 97.7 F (36.5 C)  TempSrc:   Oral Oral  Resp: Height:  6' 3.6" (1.92 m)    Weight:  69.1 kg (152 lb 5.4 oz)    SpO2: 95%  95% 96%    Weight change:  Filed Weights   03/01/15 1349 03/01/15 2301  Weight: 71.215 kg (157 lb) 69.1 kg (152 lb 5.4 oz)   Body mass index is 18.74 kg/(m^2).   Gen Exam: Awake and alert with clear speech.  Neck: Supple, No JVD.   Chest: B/L Clear.   CVS: S1 S2 regular Abdomen: soft, BS +, non tender, slightly distended.  Extremities: no edema, lower extremities warm to touch. Neurologic: Non Focal.   Skin: No Rash.   Wounds: N/A.   Intake/Output from previous day:  Intake/Output Summary (Last 24 hours) at 03/02/15 1343 Last data filed at 03/02/15 1336  Gross per 24 hour  Intake   1325 ml  Output   1625 ml  Net   -300 ml     LAB RESULTS: CBC  Recent Labs Lab 03/01/15 1420 03/02/15 0633  WBC 16.0* 20.0*  HGB 10.4* 9.4*  HCT 33.8* 30.2*  PLT 335 310  MCV 71.5* 73.3*  MCH 22.0* 22.8*  MCHC 30.8 31.1  RDW 17.4* 18.1*  LYMPHSABS 2.2  --   MONOABS 1.6*  --   EOSABS 0.3  --   BASOSABS 0.0  --     Chemistries   Recent Labs Lab 03/01/15 1420 03/02/15 0633  NA 136 139  K 3.5 3.5  CL 95* 101  CO2 30 30  GLUCOSE 99 78  BUN 35* 33*  CREATININE 1.41* 1.45*  CALCIUM 9.0 8.6*     CBG: No results for input(s): GLUCAP in the last 168 hours.  GFR Estimated Creatinine Clearance: 47.7 mL/min (by C-G formula based on Cr of 1.45).  Coagulation profile No results for input(s): INR, PROTIME in the last 168 hours.  Cardiac Enzymes No results for input(s): CKMB, TROPONINI, MYOGLOBIN in the last 168 hours.  Invalid input(s): CK  Invalid input(s): POCBNP No results for input(s): DDIMER in the last 72 hours. No results for input(s): HGBA1C in the last 72 hours. No results for input(s): CHOL, HDL, LDLCALC, TRIG, CHOLHDL, LDLDIRECT in the last 72 hours. No results for input(s): TSH, T4TOTAL, T3FREE, THYROIDAB in the last 72 hours.  Invalid input(s): FREET3 No results for input(s): VITAMINB12, FOLATE, FERRITIN, TIBC, IRON, RETICCTPCT in the last 72 hours.  Recent Labs  03/01/15 1420  LIPASE 32    Urine Studies No results for input(s): UHGB, CRYS in the last 72 hours.  Invalid input(s): UACOL, UAPR, USPG, UPH, UTP, UGL, UKET, UBIL, UNIT, UROB, ULEU, UEPI, UWBC, URBC, UBAC, CAST, UCOM, BILUA  MICROBIOLOGY: Recent Results (from the past 240 hour(s))  MRSA PCR Screening     Status: None   Collection Time: 03/02/15  6:54 AM  Result Value Ref Range Status   MRSA by PCR NEGATIVE NEGATIVE Final    Comment:  The GeneXpert MRSA Assay (FDA approved for NASAL specimens only), is one component of a comprehensive MRSA colonization surveillance program. It is not intended to diagnose MRSA infection nor to guide or monitor treatment for MRSA infections.     RADIOLOGY STUDIES/RESULTS: Ct Abdomen Pelvis W Contrast  03/01/2015  CLINICAL DATA:  70 year old male with 1 week history of left flank pain an 8 prior history of small bowel obstruction. EXAM: CT ABDOMEN AND PELVIS WITH CONTRAST TECHNIQUE: Multidetector CT imaging of the abdomen and pelvis was performed using the standard protocol following bolus administration of intravenous contrast. CONTRAST:   OMNIPAQUE IOHEXOL 300 MG/ML  SOLN COMPARISON:  Prior CT abdomen/pelvis 10/21/2014 FINDINGS: Lower Chest: Bilateral lower lobe bronchiectasis with partial bronchial impaction, atelectasis and pleural parenchymal scarring. Trace tree-in-bud micro nodularity in the posterior aspect of the right middle lobe. Findings are similar compared to prior imaging. Visualized cardiac structures within normal limits for size. No pericardial effusion. Unremarkable visualized distal thoracic esophagus. Abdomen: Unremarkable CT appearance of the stomach, duodenum, spleen, adrenal glands and pancreas. Normal hepatic contour and morphology. No discrete hepatic lesion. Portal veins are patent. Gallbladder is unremarkable. No intra or extrahepatic biliary ductal dilatation. Unremarkable appearance of the bilateral kidneys. No focal solid lesion, hydronephrosis or nephrolithiasis. 2.2 cm simple cyst in the interpolar right kidney. Multiple loops of abnormal dilated and fluid-filled small bowel throughout the abdomen. There is a parastomal hernia about the left lower quadrant colonic abscess containing a short-segment of the dilated small bowel, however this does not appear to be the source of the obstruction is the bowel is dilated on both sides of this segment. There is a focal transition point to normal caliber bowel in the right hemi abdomen (image 47 series 201) at a region of focal angulation suggesting underlying adhesions. The bowel wall mucosa enhances. No evidence of ischemic complication, perforation or free air. No significant mesenteric edema. No free fluid. Colonic diverticular disease without CT evidence of active inflammation. Prior sigmoid resection with Hartmann's pouch and left lower quadrant diverting ostomy. The Hartmann's pouch appears unremarkable. Colonic diverticular disease without CT evidence of active inflammation. Pelvis: Suprapubic catheter. No evidence of leak or complication. Unremarkable prostate gland.  Musculoskeletal: No acute fracture or aggressive appearing lytic or blastic osseous lesion. Vascular: Extensive atherosclerotic vascular calcifications. No evidence of aneurysm. 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 Bulky calcified plaque results in at least moderate narrowing of the left common femoral artery. IMPRESSION: 1. Positive for small bowel obstruction with a focal transition points in the right lower quadrant likely secondary to underlying adhesions. No evidence of significant inflammation, bowel ischemic change or perforation. 2. Surgical changes of prior sigmoidectomy with Hartmann's pouch and left lower quadrant diverting colostomy. 3. Parastomal hernia containing omental fat and a short segment of small bowel. No evidence of mechanical obstruction at this location. 4. Bilateral lower lobe bronchiectasis with areas of partial bronchial impaction, atelectasis and pleural parenchymal scarring. 5. Diverticulosis of the residual left colon without evidence of active diverticulitis. 6. 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 Does the patient have clinical symptoms of left lower extremity claudication? Electronically Signed   By: Malachy Moan M.D.   On: 03/01/2015 16:42   Dg Abd Acute W/chest  03/02/2015  CLINICAL DATA:  Leukocytosis.  NG tube in  place.  Vomiting. EXAM: DG ABDOMEN ACUTE W/ 1V CHEST COMPARISON:  Abdominal radiograph - 05/13/2013; chest radiograph - 04/26/2013; CT abdomen pelvis - 03/01/2015 FINDINGS: Grossly unchanged cardiac silhouette and mediastinal contours with extensive calcified atherosclerotic plaque within a tortuous and potentially mildly ectatic thoracic aorta. There is unchanged mild elevation of left hemidiaphragm.  Unchanged left basilar heterogeneous opacities favored to represent atelectasis. No pleural effusion or pneumothorax. No evidence of edema. Shrapnel overlies the right mid lung, unchanged. Enteric tube tip projects over the expected location of the distal esophagus. There is moderate to marked gas distention of multiple loops of small bowel with index loop of small bowel with the right upper abdominal quadrant measuring approximately 4.6 cm in diameter. There is a paucity of distal colonic gas. No pneumoperitoneum, pneumatosis or portal venous gas. Excreted contrast from prior IV contrast-enhanced abdominal CT is seen within the urinary bladder. 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 No acute osseus abnormalities IMPRESSION: 1. No acute cardiopulmonary disease. 2. Enteric tube side port projected location distal esophagus. Advancement at least 7 cm is recommended. 3. Findings worrisome for high-grade small bowel obstruction. Continued attention on follow-up is recommended Electronically Signed   By: Simonne Come M.D.   On: 03/02/2015 08:08   Dg Abd Portable 1v  03/02/2015  CLINICAL DATA:  NG tube advanced. EXAM: PORTABLE ABDOMEN - 1 VIEW COMPARISON:  03/02/2015 FINDINGS: NG tube has been advanced with the tip now in the mid stomach. Gaseous distention of small bowel loops again noted, concerning for small bowel obstruction, not significantly changed. IMPRESSION: NG tube has been advanced with the tip now in the mid stomach. Electronically Signed   By: Charlett Nose M.D.   On: 03/02/2015 12:02    Jeoffrey Massed, MD  [facility] Pager:[phone]  If 7PM-7AM, please contact night-coverage [email or web address] Password [email or web address] 03/02/2015, 1:43 PM   LOS: 1 day

## 2015-03-02 NOTE — Progress Notes (Signed)
NG tube has not had output since advancement. All connections sites assessed. Dr. Jerral Ralph telephone order for abd xray for placement confirmation.

## 2015-03-02 NOTE — Progress Notes (Signed)
Subjective: NG needs advancing, nurse is working on that now.  He has not been on suction for some time.  He has allot of gas in his ostomy bag.  He feels febrile, he won't really talk much.  He is still distended, he also has a suprapubic catheter.  I don't see a UA    Objective: Vital signs in last 24 hours: Temp:  [97.5 F (36.4 C)-98.9 F (37.2 C)] 97.7 F (36.5 C) (02/17 0506) Pulse Rate:  [80-108] 87 (02/17 0506) Resp:  [13-24] 18 (02/17 0506) BP: (120-147)/(48-98) 139/48 mmHg (02/17 0506) SpO2:  [88 %-97 %] 96 % (02/17 0506) Weight:  [69.1 kg (152 lb 5.4 oz)-71.215 kg (157 lb)] 69.1 kg (152 lb 5.4 oz) (02/16 2301) Last BM Date: 03/01/15 500 IV 625 urine 300 from NG recorded Stool 500 recorded. Afebrile, VSS Creatinine 1.45 WBC is up to 20K, H/H is stable Film this AM 0800:  1. No acute cardiopulmonary disease. 2. Enteric tube side port projected location distal esophagus. Advancement at least 7 cm is recommended. 3. Findings worrisome for high-grade small bowel obstruction. Continued attention on follow-up is recommended. Intake/Output from previous day: 02/16 0701 - 02/17 0700 In: 500 [I.V.:500] Out: 1425 [Urine:625; Drains:300; Stool:500] Intake/Output this shift:    General appearance: alert and has his head covered wtih sheet, the only time he really talked outside of single word answers was when nurse raised his bed-head to advance NG.   GI: distedned, but not really very tender.  He has allot of gas in his ostomy bag and some liquid stool.  Suprapubic catheter in place with some drainage around opening.    Lab Results:   Recent Labs  03/01/15 1420 03/02/15 0633  WBC 16.0* 20.0*  HGB 10.4* 9.4*  HCT 33.8* 30.2*  PLT 335 310    BMET  Recent Labs  03/01/15 1420 03/02/15 0633  NA 136 139  K 3.5 3.5  CL 95* 101  CO2 30 30  GLUCOSE 99 78  BUN 35* 33*  CREATININE 1.41* 1.45*  CALCIUM 9.0 8.6*   PT/INR No results for input(s): LABPROT, INR in the  last 72 hours.   Recent Labs Lab 03/01/15 1420  AST 13*  ALT 11*  ALKPHOS 78  BILITOT 0.5  PROT 8.4*  ALBUMIN 2.8*     Lipase     Component Value Date/Time   LIPASE 32 03/01/2015 1420     Studies/Results: Ct Abdomen Pelvis W Contrast  03/01/2015  CLINICAL DATA:  70 year old male with 1 week history of left flank pain an 8 prior history of small bowel obstruction. EXAM: CT ABDOMEN AND PELVIS WITH CONTRAST TECHNIQUE: Multidetector CT imaging of the abdomen and pelvis was performed using the standard protocol following bolus administration of intravenous contrast. CONTRAST:  OMNIPAQUE IOHEXOL 300 MG/ML  SOLN COMPARISON:  Prior CT abdomen/pelvis 10/21/2014 FINDINGS: Lower Chest: Bilateral lower lobe bronchiectasis with partial bronchial impaction, atelectasis and pleural parenchymal scarring. Trace tree-in-bud micro nodularity in the posterior aspect of the right middle lobe. Findings are similar compared to prior imaging. Visualized cardiac structures within normal limits for size. No pericardial effusion. Unremarkable visualized distal thoracic esophagus. Abdomen: Unremarkable CT appearance of the stomach, duodenum, spleen, adrenal glands and pancreas. Normal hepatic contour and morphology. No discrete hepatic lesion. Portal veins are patent. Gallbladder is unremarkable. No intra or extrahepatic biliary ductal dilatation. Unremarkable appearance of the bilateral kidneys. No focal solid lesion, hydronephrosis or nephrolithiasis. 2.2 cm simple cyst in the interpolar right kidney.  Multiple loops of abnormal dilated and fluid-filled small bowel throughout the abdomen. There is a parastomal hernia about the left lower quadrant colonic abscess containing a short-segment of the dilated small bowel, however this does not appear to be the source of the obstruction is the bowel is dilated on both sides of this segment. There is a focal transition point to normal caliber bowel in the right hemi  abdomen (image 47 series 201) at a region of focal angulation suggesting underlying adhesions. The bowel wall mucosa enhances. No evidence of ischemic complication, perforation or free air. No significant mesenteric edema. No free fluid. Colonic diverticular disease without CT evidence of active inflammation. Prior sigmoid resection with Hartmann's pouch and left lower quadrant diverting ostomy. The Hartmann's pouch appears unremarkable. Colonic diverticular disease without CT evidence of active inflammation. Pelvis: Suprapubic catheter. No evidence of leak or complication. Unremarkable prostate gland. Musculoskeletal: No acute fracture or aggressive appearing lytic or blastic osseous lesion. Vascular: Extensive atherosclerotic vascular calcifications. No evidence of aneurysm. 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 Bulky calcified plaque results in at least moderate narrowing of the left common femoral artery. IMPRESSION: 1. Positive for small bowel obstruction with a focal transition points in the right lower quadrant likely secondary to underlying adhesions. No evidence of significant inflammation, bowel ischemic change or perforation. 2. Surgical changes of prior sigmoidectomy with Hartmann's pouch and left lower quadrant diverting colostomy. 3. Parastomal hernia containing omental fat and a short segment of small bowel. No evidence of mechanical obstruction at this location. 4. Bilateral lower lobe bronchiectasis with areas of partial bronchial impaction, atelectasis and pleural parenchymal scarring. 5. Diverticulosis of the residual left colon without evidence of active diverticulitis. 6. 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 Does the patient have clinical  symptoms of left lower extremity claudication? Electronically Signed   By: Malachy Moan M.D.   On: 03/01/2015 16:42   Dg Abd Acute W/chest  03/02/2015  CLINICAL DATA:  Leukocytosis.  NG tube in place.  Vomiting. EXAM: DG ABDOMEN ACUTE W/ 1V CHEST COMPARISON:  Abdominal radiograph - 05/13/2013; chest radiograph - 04/26/2013; CT abdomen pelvis - 03/01/2015 FINDINGS: Grossly unchanged cardiac silhouette and mediastinal contours with extensive calcified atherosclerotic plaque within a tortuous and potentially mildly ectatic thoracic aorta. There is unchanged mild elevation of left hemidiaphragm. Unchanged left basilar heterogeneous opacities favored to represent atelectasis. No pleural effusion or pneumothorax. No evidence of edema. Shrapnel overlies the right mid lung, unchanged. Enteric tube tip projects over the expected location of the distal esophagus. There is moderate to marked gas distention of multiple loops of small bowel with index loop of small bowel with the right upper abdominal quadrant measuring approximately 4.6 cm in diameter. There is a paucity of distal colonic gas. No pneumoperitoneum, pneumatosis or portal venous gas. Excreted contrast from prior IV contrast-enhanced abdominal CT is seen within the urinary bladder. 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 No acute osseus abnormalities IMPRESSION: 1. No acute cardiopulmonary disease. 2. Enteric tube side port projected location distal esophagus. Advancement at least 7 cm is recommended. 3. Findings worrisome for high-grade small bowel obstruction. Continued attention on follow-up is recommended Electronically Signed   By: Simonne Come M.D.   On: 03/02/2015 08:08    Medications: . amiodarone  200 mg Oral Daily  .  antiseptic oral rinse  7 mL Mouth Rinse q12n4p  . aspirin EC  81 mg Oral Daily  . chlorhexidine  15 mL Mouth Rinse BID  . escitalopram  10 mg Oral Daily  . heparin  5,000 Units Subcutaneous 3 times per day  .  lamoTRIgine  25 mg Oral Daily  . levothyroxine  37.5 mcg Intravenous Daily  . mometasone-formoterol  2 puff Inhalation BID  . pantoprazole (PROTONIX) IV  40 mg Intravenous Q24H    Assessment/Plan Abdominal pain with hx of diverting colostomy for decubitus SBO likely due to adhesions vs ileus with UTI I&D left abdominal wall abscess, prior gastrostomy site, 10/21/14, Dr. Ezzard Standing COPD Hypertension Hepatitis B CAD with hx of MI Hypothyroidism Hx of depression Bed ridden in 10/2014 Chronic anemia Hx of malnutrition Antibiotics:  None DVT:  Heparin/SCD    Plan:  Advance NG, nurse was doing that when I left.It hasn't really be functional,except initial drainage.   Medicine is going to check his urine.  I would leave NG in today and see how he does.  Check film in AM and if doing well we can pull in AM. tomorrow.  LOS: 1 day    Craig [name] 03/02/2015

## 2015-03-02 NOTE — Progress Notes (Signed)
Pharmacy Antibiotic Note  Craig [name] is a 70 y.o. male admitted on 03/01/2015 with UTI.  Pharmacy has been consulted for cefepime dosing.  UTI related to chronic indwelling suprapubic catheter  Plan: Cefepime 2 gram q 24 hour  Monitor clinical progress, c/s, renal function, abx plan/LOT   Height: 6' 3.6" (192 cm) Weight: 152 lb 5.4 oz (69.1 kg) IBW/kg (Calculated) : 85.88  Temp (24hrs), Avg:98 F (36.7 C), Min:97.7 F (36.5 C), Max:98.3 F (36.8 C)   Recent Labs Lab 03/01/15 1420 03/02/15 0633  WBC 16.0* 20.0*  CREATININE 1.41* 1.45*    Estimated Creatinine Clearance: 47.7 mL/min (by C-G formula based on Cr of 1.45).    No Known Allergies  Antimicrobials this admission: 2/17 Cefepime >>   Dose adjustments this admission: none  Microbiology results: 2/17 BCx: sent 2/17 UCx: sent  2/17 MRSA PCR: negative  Thank you for allowing [place] to participate in this patients care. Signe Colt, PharmD Pager: [phone] 03/02/2015 2:03 PM

## 2015-03-02 NOTE — Progress Notes (Signed)
NG tube advanced 7cm and verified by ascultation. MD at bedside and verbal order to change suprapubic catheter to obtain urine sample.

## 2015-03-03 ENCOUNTER — Inpatient Hospital Stay (HOSPITAL_COMMUNITY): Payer: Medicare Other

## 2015-03-03 LAB — BASIC METABOLIC PANEL
Anion gap: 7 (ref 5–15)
BUN: 18 mg/dL (ref 6–20)
CALCIUM: 8.7 mg/dL — AB (ref 8.9–10.3)
CHLORIDE: 105 mmol/L (ref 101–111)
CO2: 28 mmol/L (ref 22–32)
CREATININE: 1.09 mg/dL (ref 0.61–1.24)
GFR calc Af Amer: >60 mL/min (ref >60)
GFR calc non Af Amer: >60 mL/min (ref >60)
Glucose, Bld: 80 mg/dL (ref 65–99)
Potassium: 4.1 mmol/L (ref 3.5–5.1)
SODIUM: 140 mmol/L (ref 135–145)

## 2015-03-03 LAB — PREALBUMIN: Prealbumin: 6.8 mg/dL — ABNORMAL LOW (ref 18–38)

## 2015-03-03 LAB — FERRITIN: FERRITIN: 45 ng/mL (ref 24–336)

## 2015-03-03 LAB — MAGNESIUM: MAGNESIUM: 1.9 mg/dL (ref 1.7–2.4)

## 2015-03-03 LAB — RETICULOCYTES
RBC.: 3.99 MIL/uL — ABNORMAL LOW (ref 4.22–5.81)
RETIC COUNT ABSOLUTE: 55.9 10*3/uL (ref 19.0–186.0)
RETIC CT PCT: 1.4 % (ref 0.4–3.1)

## 2015-03-03 LAB — CBC
HCT: 29.5 % — ABNORMAL LOW (ref 39.0–52.0)
Hemoglobin: 8.6 g/dL — ABNORMAL LOW (ref 13.0–17.0)
MCH: 21.6 pg — AB (ref 26.0–34.0)
MCHC: 29.2 g/dL — AB (ref 30.0–36.0)
MCV: 73.9 fL — ABNORMAL LOW (ref 78.0–100.0)
PLATELETS: 276 10*3/uL (ref 150–400)
RBC: 3.99 MIL/uL — ABNORMAL LOW (ref 4.22–5.81)
RDW: 17.9 % — AB (ref 11.5–15.5)
WBC: 12.5 10*3/uL — ABNORMAL HIGH (ref 4.0–10.5)

## 2015-03-03 LAB — IRON AND TIBC
IRON: 7 ug/dL — AB (ref 45–182)
Saturation Ratios: 3 % — ABNORMAL LOW (ref 17.9–39.5)
TIBC: 235 ug/dL — ABNORMAL LOW (ref 250–450)
UIBC: 228 ug/dL

## 2015-03-03 LAB — VITAMIN B12: Vitamin B-12: 1457 pg/mL — ABNORMAL HIGH (ref 180–914)

## 2015-03-03 LAB — FOLATE: Folate: 47.4 ng/mL (ref >5.9)

## 2015-03-03 MED ORDER — DEXTROSE 5 % IV SOLN
2.0000 g | Freq: Two times a day (BID) | INTRAVENOUS | Status: DC
  Administered 2015-03-03 – 2015-03-06 (×7): 2 g via INTRAVENOUS
  Filled 2015-03-03 (×8): qty 2

## 2015-03-03 NOTE — Progress Notes (Signed)
Pharmacy Antibiotic Note  Craig [name] is a 70 y.o. male admitted on 03/01/2015 with UTI.  Pharmacy was consulted for cefepime dosingfor UTI related to chronic indwelling suprapubic catheter and later added vancomycin dosing for +blood culture 1 of 2 growing GPC.  Pt growing GPC in 1/2 blood cx - suspected contaminant thus MD dc'd IV vancomycin.  AKI resolved , SCr down to 1.09, CrCl ~ 63 ml/min- will adjust cefepime for renal function improvement.   Plan: Vancomycin DC'd by Dr. Jerral Ralph  Adjust Cefepime dose from q24h dosing interval to 2g IV q12h  Monitor clinical progress, c/s, renal function, abx plan/LOT   Height: 6' 3.6" (192 cm) Weight: 152 lb 5.4 oz (69.1 kg) IBW/kg (Calculated) : 85.88  Temp (24hrs), Avg:98 F (36.7 C), Min:97.5 F (36.4 C), Max:98.7 F (37.1 C)   Recent Labs Lab 03/01/15 1420 03/02/15 0633 03/03/15 0456  WBC 16.0* 20.0* 12.5*  CREATININE 1.41* 1.45* 1.09    Estimated Creatinine Clearance: 63.4 mL/min (by C-G formula based on Cr of 1.09).    No Known Allergies  Antimicrobials this admission: 2/17 Cefepime >>  2/17 Vancomycin >>2/18  Dose adjustments this admission: Will adjust Cefepime to 2g IV q12h    Microbiology results: 2/17 BCx: GPC in 1 of 2 cultures 2/17 UCx: too young to read, results pending  2/17 MRSA PCR: negative  Thank you for allowing pharmacy to be part of this patients care team.  Noah Delaine, RPh Clinical Pharmacist Pager: [phone] 03/03/2015 2:29 PM

## 2015-03-03 NOTE — Progress Notes (Signed)
  Subjective: hungry  Objective: Vital signs in last 24 hours: Temp:  [97.5 F (36.4 C)-97.9 F (36.6 C)] 97.9 F (36.6 C) (02/18 1610) Pulse Rate:  [79-80] 79 (02/18 0633) Resp:  [15-18] 15 (02/18 9604) BP: (110-131)/(42-45) 131/42 mmHg (02/18 0633) SpO2:  [100 %] 100 % (02/18 5409) Last BM Date: 03/02/15  Intake/Output from previous day: 02/17 0701 - 02/18 0700 In: 887.5 [I.V.:887.5] Out: 1975 [Urine:550; Emesis/NG output:1225; Stool:200] Intake/Output this shift: Total I/O In: -  Out: 400 [Urine:400]  General appearance: cooperative Resp: clear to auscultation bilaterally GI: soft, NT, large amount air and some liquid stool in bag, +BS  Lab Results:   Recent Labs  03/02/15 0633 03/03/15 0456  WBC 20.0* 12.5*  HGB 9.4* 8.6*  HCT 30.2* 29.5*  PLT 310 276   BMET  Recent Labs  03/02/15 0633 03/03/15 0456  NA 139 140  K 3.5 4.1  CL 101 105  CO2 30 28  GLUCOSE 78 80  BUN 33* 18  CREATININE 1.45* 1.09  CALCIUM 8.6* 8.7*   Anti-infectives: Anti-infectives    Start     Dose/Rate Route Frequency Ordered Stop   03/03/15 0600  vancomycin (VANCOCIN) 500 mg in sodium chloride 0.9 % 100 mL IVPB     500 mg 100 mL/hr over 60 Minutes Intravenous Every 12 hours 03/02/15 1729     03/02/15 1830  vancomycin (VANCOCIN) 1,250 mg in sodium chloride 0.9 % 250 mL IVPB     1,250 mg 166.7 mL/hr over 90 Minutes Intravenous  Once 03/02/15 1729 03/03/15 0032   03/02/15 1500  ceFEPIme (MAXIPIME) 2 g in dextrose 5 % 50 mL IVPB     2 g 100 mL/hr over 30 Minutes Intravenous Every 24 hours 03/02/15 1402        Assessment/Plan: SBO - seems to be improving but still some dilated SB loops on film, continue NGT today, if continues to have ostomy output will clamp in AM  LOS: 2 days    Calandria Mullings E 03/03/2015

## 2015-03-03 NOTE — Progress Notes (Signed)
PT Cancellation Note  Patient Details Name: Craig [name] MRN: [id number] DOB: Nov 25, 1945   Cancelled Treatment:    Reason Eval/Treat Not Completed: PT screened, no needs identified, will sign off. Pt has been essentially bedbound at [facility] for several years. Not appropriate for acute care PT intervention.   Ilda Foil 03/03/2015, 9:13 AM

## 2015-03-03 NOTE — Progress Notes (Addendum)
PATIENT DETAILS Name: Craig [name] Age: 70 y.o. 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  Subjective: Less abdominal pain today-significant output in colostomy. Wants to eat.  Assessment/Plan: Bowel obstruction: Liquid stool in colostomy bag-abdomen is less distended. Improved with supportive measures, NG tube remains in place. Gen. surgery following, recommendations are to keep nothing by mouth/continue NG tube for another day  AKI (acute kidney injury): Likely prerenal azotemia-from dehydration/poor oral intake. Resolved, continue to cautiously hydrate.   UTI related to chronic indwelling suprapubic catheter: Complicated UTI, likely causing leukocytosis-start cefepime as at risk for drug-resistant organisms. Follow cultures and WBC count.  Coagulase-negative bacteremia: Suspect a contaminant and not real infection. Discontinue vancomycin.  Paroxysmal A-fib: Currently in sinus rhythm, very poor long-term candidate for anticoagulation. Continue aspirin.  Hypothyroidism: Continue IV levothyroxine-once oral intake stabilizes-convert to oral regimen.   GERD (gastroesophageal reflux disease): Continue PPI   Depression: Continue usual medications.  COPD (chronic obstructive pulmonary disease): Continue bronchodilators, lungs are clear-this problem appears currently inactive at this time.   Anemia of chronic disease: Has chronic anemia at baseline-suspect that this is  predominantly from chronic disease.Iron panel consistent with combined Chronic disease and Fe def. Follow .  Peripheral vascular disease: CT of the abdomen incidentally demonstrates extensive atherosclerotic vascular calcifications including high-grade stenosis of the left common iliac artery, moderate stenosis of the left femoral artery-suspect these are chronic issues. He has extensive muscle atrophy in his bilateral lower extremities. He is minimally ambulatory if it best-and mostly bed to wheelchair bound. Furthermore, both lower extremities appear warm, dorsalis pedis pulses present. Given poor overall condition/failure to thrive syndrome with protein calorie malnutrition-suspect best managed medically and likely not a candidate for aggressive care.   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  Failure to thrive in adult/Protein-calorie malnutrition, severe : nutrition consult when oral intake more stable. PT evaluation. Very poor overall prognoses.  Palliative care: Mostly bedbound-very frail patient with multiple medical comorbidities. Did have a palliative care evaluation last admission-DO NOT RESUSCITATE already in place-this was reconfirmed by this M.D.on admission with patient. Very poor overall prognoses, suspect best served by conservative management. If deteriorates or Aggressive care is required, will need involvement of palliative care services.  Disposition: Remain inpatient-back to SNF in 2-3 days  Antimicrobial agents  See below  Anti-infectives    Start     Dose/Rate Route Frequency Ordered Stop   03/03/15 0600  vancomycin (VANCOCIN) 500 mg in sodium chloride 0.9 % 100 mL IVPB     500 mg 100 mL/hr over 60 Minutes Intravenous Every 12 hours 03/02/15 1729     03/02/15 1830  vancomycin (VANCOCIN) 1,250 mg in sodium chloride 0.9 % 250 mL IVPB     1,250 mg 166.7 mL/hr over 90 Minutes Intravenous  Once 03/02/15 1729  03/03/15 0032   03/02/15 1500  ceFEPIme (MAXIPIME) 2 g in dextrose 5 % 50 mL IVPB     2 g 100 mL/hr over 30 Minutes Intravenous Every 24 hours 03/02/15  1402        DVT Prophylaxis: Prophylactic Heparin   Code Status: Full code   Family Communication [name] [phone] over the phone 2/17-none at bedside today  Procedures: None  CONSULTS:  general surgery  Time spent 25 minutes-Greater than 50% of this time was spent in counseling, explanation of diagnosis, planning of further management, and coordination of care.  MEDICATIONS: Scheduled Meds: . amiodarone  200 mg Oral Daily  . antiseptic oral rinse  7 mL Mouth Rinse q12n4p  . aspirin EC  81 mg Oral Daily  . ceFEPime (MAXIPIME) IV  2 g Intravenous Q24H  . chlorhexidine  15 mL Mouth Rinse BID  . escitalopram  10 mg Oral Daily  . heparin  5,000 Units Subcutaneous 3 times per day  . lamoTRIgine  25 mg Oral Daily  . levothyroxine  37.5 mcg Intravenous Daily  . mometasone-formoterol  2 puff Inhalation BID  . pantoprazole (PROTONIX) IV  40 mg Intravenous Q24H  . vancomycin  500 mg Intravenous Q12H   Continuous Infusions: . dextrose 5 % 1,000 mL with potassium chloride 40 mEq infusion 100 mL/hr at 03/02/15 1533   PRN Meds:.acetaminophen **OR** acetaminophen, albuterol, guaiFENesin-dextromethorphan, morphine injection, ondansetron **OR** ondansetron (ZOFRAN) IV    PHYSICAL EXAM: Vital signs in last 24 hours: Filed Vitals:   03/01/15 2304 03/02/15 0506 03/02/15 2247 03/03/15 0633  BP: 142/58 139/48 110/45 131/42  Pulse: 99 87 80 79  Temp: 98.3 F (36.8 C) 97.7 F (36.5 C) 97.5 F (36.4 C) 97.9 F (36.6 C)  TempSrc: Oral Oral Axillary Axillary  Resp: Height:      Weight:      SpO2: 95% 96% 100% 100%    Weight change:  Filed Weights   03/01/15 1349 03/01/15 2301  Weight: 71.215 kg (157 lb) 69.1 kg (152 lb 5.4 oz)   Body mass index is 18.74 kg/(m^2).   Gen Exam: Awake and  alert with clear speech. NGT in place Neck: Supple, No JVD.   Chest: B/L Clear.   CVS: S1 S2 regular Abdomen: soft, BS +, non tender, slightly distended. Colostomy in place with liquid stool Extremities: no edema, lower extremities warm to touch. Neurologic: Non Focal.   Skin: No Rash.   Wounds: N/A.   Intake/Output from previous day:  Intake/Output Summary (Last 24 hours) at 03/03/15 1231 Last data filed at 03/03/15 1041  Gross per 24 hour  Intake  637.5 ml  Output   2175 ml  Net -1537.5 ml     LAB RESULTS: CBC  Recent Labs Lab 03/01/15 1420 03/02/15 0633 03/03/15 0456  WBC 16.0* 20.0* 12.5*  HGB 10.4* 9.4* 8.6*  HCT 33.8* 30.2* 29.5*  PLT 335 310 276  MCV 71.5* 73.3* 73.9*  MCH 22.0* 22.8* 21.6*  MCHC 30.8 31.1 29.2*  RDW 17.4* 18.1* 17.9*  LYMPHSABS 2.2  --   --   MONOABS 1.6*  --   --   EOSABS 0.3  --   --   BASOSABS 0.0  --   --     Chemistries   Recent Labs Lab 03/01/15 1420 03/02/15 0633 03/03/15 0456  NA 136 139 140  K 3.5 3.5 4.1  CL 95* 101 105  CO2 GLUCOSE 99 78 80  BUN 35* 33* 18  CREATININE 1.41* 1.45* 1.09  CALCIUM 9.0 8.6* 8.7*  MG  --   --  1.9    CBG: No results for input(s): GLUCAP in  the last 168 hours.  GFR Estimated Creatinine Clearance: 63.4 mL/min (by C-G formula based on Cr of 1.09).  Coagulation profile No results for input(s): INR, PROTIME in the last 168 hours.  Cardiac Enzymes No results for input(s): CKMB, TROPONINI, MYOGLOBIN in the last 168 hours.  Invalid input(s): CK  Invalid input(s): POCBNP No results for input(s): DDIMER in the last 72 hours. No results for input(s): HGBA1C in the last 72 hours. No results for input(s): CHOL, HDL, LDLCALC, TRIG, CHOLHDL, LDLDIRECT in the last 72 hours. No results for input(s): TSH, T4TOTAL, T3FREE, THYROIDAB in the last 72 hours.  Invalid input(s): FREET3  Recent Labs  03/03/15 0456  VITAMINB12 1457*  FOLATE 47.4  FERRITIN 45  TIBC 235*  IRON 7*    RETICCTPCT 1.4    Recent Labs  03/01/15 1420  LIPASE 32    Urine Studies No results for input(s): UHGB, CRYS in the last 72 hours.  Invalid input(s): UACOL, UAPR, USPG, UPH, UTP, UGL, UKET, UBIL, UNIT, UROB, ULEU, UEPI, UWBC, URBC, UBAC, CAST, UCOM, BILUA  MICROBIOLOGY: Recent Results (from the past 240 hour(s))  Culture, blood (routine x 2)     Status: None (Preliminary result)   Collection Time: 03/02/15 12:35 AM  Result Value Ref Range Status   Specimen Description BLOOD LEFT ANTECUBITAL  Final   Special Requests BOTTLES DRAWN AEROBIC ONLY 5CC  Final   Culture  Setup Time   Final    GRAM POSITIVE COCCI IN CLUSTERS AEROBIC BOTTLE ONLY CRITICAL RESULT CALLED TO, READ BACK BY AND VERIFIED WITH: D Haman,RN AT 1628 03/02/15 BY L BENFIELD    Culture   Final    STAPHYLOCOCCUS SPECIES (COAGULASE NEGATIVE) THE SIGNIFICANCE OF ISOLATING THIS ORGANISM FROM A SINGLE SET OF BLOOD CULTURES WHEN MULTIPLE SETS ARE DRAWN IS UNCERTAIN. PLEASE NOTIFY THE MICROBIOLOGY DEPARTMENT WITHIN ONE WEEK IF SPECIATION AND SENSITIVITIES ARE REQUIRED.    Report Status PENDING  Incomplete  MRSA PCR Screening     Status: None   Collection Time: 03/02/15  6:54 AM  Result Value Ref Range Status   MRSA by PCR NEGATIVE NEGATIVE Final    Comment:        The GeneXpert MRSA Assay (FDA approved for NASAL specimens only), is one component of a comprehensive MRSA colonization surveillance program. It is not intended to diagnose MRSA infection nor to guide or monitor treatment for MRSA infections.   Urine culture     Status: None (Preliminary result)   Collection Time: 03/02/15  2:17 PM  Result Value Ref Range Status   Specimen Description URINE, SUPRAPUBIC  Final   Special Requests NONE  Final   Culture TOO YOUNG TO READ  Final   Report Status PENDING  Incomplete    RADIOLOGY STUDIES/RESULTS: Ct Abdomen Pelvis W Contrast  03/01/2015  CLINICAL DATA:  70 year old male with 1 week history of left  flank pain an 8 prior history of small bowel obstruction. EXAM: CT ABDOMEN AND PELVIS WITH CONTRAST TECHNIQUE: Multidetector CT imaging of the abdomen and pelvis was performed using the standard protocol following bolus administration of intravenous contrast. CONTRAST:  OMNIPAQUE IOHEXOL 300 MG/ML  SOLN COMPARISON:  Prior CT abdomen/pelvis 10/21/2014 FINDINGS: Lower Chest: Bilateral lower lobe bronchiectasis with partial bronchial impaction, atelectasis and pleural parenchymal scarring. Trace tree-in-bud micro nodularity in the posterior aspect of the right middle lobe. Findings are similar compared to prior imaging. Visualized cardiac structures within normal limits for size. No pericardial effusion. Unremarkable visualized distal thoracic esophagus. Abdomen:  Unremarkable CT appearance of the stomach, duodenum, spleen, adrenal glands and pancreas. Normal hepatic contour and morphology. No discrete hepatic lesion. Portal veins are patent. Gallbladder is unremarkable. No intra or extrahepatic biliary ductal dilatation. Unremarkable appearance of the bilateral kidneys. No focal solid lesion, hydronephrosis or nephrolithiasis. 2.2 cm simple cyst in the interpolar right kidney. Multiple loops of abnormal dilated and fluid-filled small bowel throughout the abdomen. There is a parastomal hernia about the left lower quadrant colonic abscess containing a short-segment of the dilated small bowel, however this does not appear to be the source of the obstruction is the bowel is dilated on both sides of this segment. There is a focal transition point to normal caliber bowel in the right hemi abdomen (image 47 series 201) at a region of focal angulation suggesting underlying adhesions. The bowel wall mucosa enhances. No evidence of ischemic complication, perforation or free air. No significant mesenteric edema. No free fluid. Colonic diverticular disease without CT evidence of active inflammation. Prior sigmoid resection  with Hartmann's pouch and left lower quadrant diverting ostomy. The Hartmann's pouch appears unremarkable. Colonic diverticular disease without CT evidence of active inflammation. Pelvis: Suprapubic catheter. No evidence of leak or complication. Unremarkable prostate gland. Musculoskeletal: No acute fracture or aggressive appearing lytic or blastic osseous lesion. Vascular: Extensive atherosclerotic vascular calcifications. No evidence of aneurysm. 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 Bulky calcified plaque results in at least moderate narrowing of the left common femoral artery. IMPRESSION: 1. Positive for small bowel obstruction with a focal transition points in the right lower quadrant likely secondary to underlying adhesions. No evidence of significant inflammation, bowel ischemic change or perforation. 2. Surgical changes of prior sigmoidectomy with Hartmann's pouch and left lower quadrant diverting colostomy. 3. Parastomal hernia containing omental fat and a short segment of small bowel. No evidence of mechanical obstruction at this location. 4. Bilateral lower lobe bronchiectasis with areas of partial bronchial impaction, atelectasis and pleural parenchymal scarring. 5. Diverticulosis of the residual left colon without evidence of active diverticulitis. 6. 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 Does the patient have clinical symptoms of left lower extremity claudication? Electronically Signed   By: Malachy Moan M.D.   On: 03/01/2015 16:42   Dg Abd 2 Views  03/03/2015  CLINICAL DATA:  70 year old male with small bowel obstruction EXAM: ABDOMEN - 2 VIEW COMPARISON:  Prior abdominal radiograph 03/02/2015 FINDINGS: Diffuse gaseous distention and  dilatation of small bowel throughout the mid and upper abdomen. Maximal small bowel diameter is 5.7 cm in the right upper quadrant. A nasogastric tube is in place with the tip overlying the stomach. Overall, the bowel gas pattern is similar to slightly worse compared to yesterday. No evidence of free air. IMPRESSION: 1. Persistent, and perhaps slightly worse small bowel obstruction. Maximal small bowel diameter is 5.7 cm in the right upper quadrant. 2. No evidence of free air on the decubitus views. 3. Gastric tube remains in good position. Electronically Signed   By: Malachy Moan M.D.   On: 03/03/2015 09:45   Dg Abd Acute W/chest  03/02/2015  CLINICAL DATA:  Leukocytosis.  NG tube in place.  Vomiting. EXAM: DG ABDOMEN ACUTE W/ 1V CHEST COMPARISON:  Abdominal radiograph - 05/13/2013; chest radiograph - 04/26/2013; CT abdomen pelvis - 03/01/2015 FINDINGS: Grossly unchanged cardiac  silhouette and mediastinal contours with extensive calcified atherosclerotic plaque within a tortuous and potentially mildly ectatic thoracic aorta. There is unchanged mild elevation of left hemidiaphragm. Unchanged left basilar heterogeneous opacities favored to represent atelectasis. No pleural effusion or pneumothorax. No evidence of edema. Shrapnel overlies the right mid lung, unchanged. Enteric tube tip projects over the expected location of the distal esophagus. There is moderate to marked gas distention of multiple loops of small bowel with index loop of small bowel with the right upper abdominal quadrant measuring approximately 4.6 cm in diameter. There is a paucity of distal colonic gas. No pneumoperitoneum, pneumatosis or portal venous gas. Excreted contrast from prior IV contrast-enhanced abdominal CT is seen within the urinary bladder. 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 No acute osseus abnormalities IMPRESSION: 1. No acute cardiopulmonary disease. 2. Enteric tube side port projected location distal  esophagus. Advancement at least 7 cm is recommended. 3. Findings worrisome for high-grade small bowel obstruction. Continued attention on follow-up is recommended Electronically Signed   By: Simonne Come M.D.   On: 03/02/2015 08:08   Dg Abd Portable 1v  03/02/2015  CLINICAL DATA:  NG tube advanced. EXAM: PORTABLE ABDOMEN - 1 VIEW COMPARISON:  03/02/2015 FINDINGS: NG tube has been advanced with the tip now in the mid stomach. Gaseous distention of small bowel loops again noted, concerning for small bowel obstruction, not significantly changed. IMPRESSION: NG tube has been advanced with the tip now in the mid stomach. Electronically Signed   By: Charlett Nose M.D.   On: 03/02/2015 12:02    Jeoffrey Massed, MD  [facility] Pager:[phone]  If 7PM-7AM, please contact night-coverage [email or web address] Password [facility] 03/03/2015, 12:31 PM   LOS: 2 days

## 2015-03-04 ENCOUNTER — Inpatient Hospital Stay (HOSPITAL_COMMUNITY): Payer: Medicare Other

## 2015-03-04 LAB — BASIC METABOLIC PANEL
Anion gap: 7 (ref 5–15)
BUN: 7 mg/dL (ref 6–20)
CALCIUM: 8.8 mg/dL — AB (ref 8.9–10.3)
CO2: 29 mmol/L (ref 22–32)
Chloride: 102 mmol/L (ref 101–111)
Creatinine, Ser: 0.92 mg/dL (ref 0.61–1.24)
GFR calc non Af Amer: >60 mL/min (ref >60)
Glucose, Bld: 98 mg/dL (ref 65–99)
Potassium: 4.5 mmol/L (ref 3.5–5.1)
Sodium: 138 mmol/L (ref 135–145)

## 2015-03-04 LAB — CBC
HEMATOCRIT: 28 % — AB (ref 39.0–52.0)
Hemoglobin: 8.5 g/dL — ABNORMAL LOW (ref 13.0–17.0)
MCH: 22.4 pg — AB (ref 26.0–34.0)
MCHC: 30.4 g/dL (ref 30.0–36.0)
MCV: 73.7 fL — AB (ref 78.0–100.0)
Platelets: 277 10*3/uL (ref 150–400)
RBC: 3.8 MIL/uL — ABNORMAL LOW (ref 4.22–5.81)
RDW: 17.9 % — AB (ref 11.5–15.5)
WBC: 10.4 10*3/uL (ref 4.0–10.5)

## 2015-03-04 LAB — CULTURE, BLOOD (ROUTINE X 2)

## 2015-03-04 NOTE — Progress Notes (Signed)
PATIENT DETAILS Name: Craig [name] Age: 70 y.o. Sex: male Date of Birth: 27-May-1945 Admit Date: 03/01/2015 Admitting Physician Dewayne Shorter Levora Dredge, MD JWJ:[id number], Maxine Glenn, DO  Brief narrative: 70 year old male-resident of a skilled nursing facility-with past medical history of a sacral decubiti ulcer (now healed) requiring a diverting colostomy, paroxysmal atrial fibrillation not on anticoagulation, chronic suprapubic catheter in place, failure to thrive syndrome-mostly bed to wheelchair bound presented to the hospital on 12/16 with bowel obstruction likely secondary to adhesions.  Subjective:  Patient in bed, no headache, no fever or chills, no chest abdominal pain, no nausea, wants to eat. Focal weakness.  Assessment/Plan:  Bowel obstruction: Liquid stool in colostomy bag-abdomen is less distended. Improved with supportive measures, general surgery following, per general surgery clamp NG tube for 24 hours however patient pulled NG tube despite being counseled on 03/04/2015, will monitor off NG tube for now, repeat x-ray in the morning per surgery if x-ray and clinically better resume liquid diet on 03/05/2015.  AKI (acute kidney injury): Likely prerenal azotemia-from dehydration/poor oral intake. Resolved, continue to cautiously hydrate.   UTI related to chronic indwelling suprapubic catheter: Complicated UTI, suprapubic catheter was changed on 03/03/2015, currently on empiric IV antibiotic monitor.  Coagulase-negative bacteremia 1/2: Suspect a contaminant and not real infection. Discontinued vancomycin.  Paroxysmal A-fib with [place] vasc 2 score of greater than 3: Currently in sinus rhythm, very poor long-term candidate for anticoagulation. Continue aspirin. Defer long-term enteric regulation to PCP and primary cardiologist.  Hypothyroidism: Continue IV levothyroxine-once oral intake stabilizes-convert to oral regimen.   GERD (gastroesophageal reflux disease):  Continue PPI   Depression: Continue usual medications.  COPD (chronic obstructive pulmonary disease): Continue bronchodilators, lungs are clear-this problem appears currently inactive at this time.   Anemia of chronic disease: Has chronic anemia at baseline-suspect that this is predominantly from chronic disease.Iron panel consistent with combined Chronic disease and Fe def. Follow .  Peripheral vascular disease: CT of the abdomen incidentally demonstrates extensive atherosclerotic vascular calcifications including high-grade stenosis of the left common iliac artery, moderate stenosis of the left femoral artery-suspect these are chronic issues. He has extensive muscle atrophy in his bilateral lower extremities. He is minimally ambulatory if it best-and mostly bed to wheelchair bound. Furthermore, both lower extremities appear warm, dorsalis pedis pulses present. Given poor overall condition/failure to thrive syndrome with protein calorie malnutrition-suspect best managed medically and likely not a candidate for aggressive care. Continue aspirin for secondary prevention with outpatient follow-up with PCP, recommend to PCP kindly consider one-time outpatient vascular surgery input.  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  Failure to thrive in adult/Protein-calorie malnutrition, severe : nutrition consult when oral intake more stable. PT evaluation. Very poor overall prognoses.  Palliative care: Mostly bedbound-very frail patient with multiple medical comorbidities. Did have a palliative care evaluation last admission-DO NOT RESUSCITATE already in place-this was reconfirmed by this M.D.on admission with patient. Very poor overall prognoses, suspect best served by conservative management. If deteriorates or Aggressive care is required, will need involvement of palliative care services.   Disposition: Remain inpatient-back to SNF in 2-3  days  Antimicrobial agents  See below  Anti-infectives    Start     Dose/Rate Route Frequency Ordered Stop   03/03/15 1500  ceFEPIme (MAXIPIME) 2 g in dextrose 5 % 50 mL IVPB     2 g 100 mL/hr over 30 Minutes Intravenous Every  12 hours 03/03/15 1428     03/03/15 0600  vancomycin (VANCOCIN) 500 mg in sodium chloride 0.9 % 100 mL IVPB  Status:  Discontinued     500 mg 100 mL/hr over 60 Minutes Intravenous Every 12 hours 03/02/15 1729 03/03/15 1247   03/02/15 1830  vancomycin (VANCOCIN) 1,250 mg in sodium chloride 0.9 % 250 mL IVPB     1,250 mg 166.7 mL/hr over 90 Minutes Intravenous  Once 03/02/15 1729 03/03/15 0032   03/02/15 1500  ceFEPIme (MAXIPIME) 2 g in dextrose 5 % 50 mL IVPB  Status:  Discontinued     2 g 100 mL/hr over 30 Minutes Intravenous Every 24 hours 03/02/15 1402 03/03/15 1428      DVT Prophylaxis: Prophylactic Heparin   Code Status: Full code   Family Communication Ennis Heavner [phone] over the phone 2/17-none at bedside today  Procedures: None  CONSULTS:  general surgery  Time spent 25 minutes-Greater than 50% of this time was spent in counseling, explanation of diagnosis, planning of further management, and coordination of care.  MEDICATIONS: Scheduled Meds: . amiodarone  200 mg Oral Daily  . antiseptic oral rinse  7 mL Mouth Rinse q12n4p  . aspirin EC  81 mg Oral Daily  . ceFEPime (MAXIPIME) IV  2 g Intravenous Q12H  . chlorhexidine  15 mL Mouth Rinse BID  . escitalopram  10 mg Oral Daily  . heparin  5,000 Units Subcutaneous 3 times per day  . lamoTRIgine  25 mg Oral Daily  . levothyroxine  37.5 mcg Intravenous Daily  . mometasone-formoterol  2 puff Inhalation BID  . pantoprazole (PROTONIX) IV  40 mg Intravenous Q24H   Continuous Infusions: . dextrose 5 % 1,000 mL with potassium chloride 40 mEq infusion 100 mL/hr at 03/03/15 2257   PRN Meds:.acetaminophen **OR** acetaminophen, albuterol, guaiFENesin-dextromethorphan,  morphine injection, ondansetron **OR** ondansetron (ZOFRAN) IV    PHYSICAL EXAM: Vital signs in last 24 hours: Filed Vitals:   03/03/15 0633 03/03/15 1238 03/03/15 2350 03/04/15 0537  BP: 131/42 138/41 138/47 134/55  Pulse: 79 87 84 80  Temp: 97.9 F (36.6 C) 98.7 F (37.1 C) 98.8 F (37.1 C) 98.2 F (36.8 C)  TempSrc: Axillary  Axillary Oral  Resp: 15 19 16 18   Height:      Weight:      SpO2: 100% 100% 96% 99%    Weight change:  Filed Weights   03/01/15 1349 03/01/15 2301  Weight: 71.215 kg (157 lb) 69.1 kg (152 lb 5.4 oz)   Body mass index is 18.74 kg/(m^2).   Gen Exam: Awake and alert with clear speech. NGT in place Neck: Supple, No JVD.   Chest: B/L Clear.   CVS: S1 S2 regular Abdomen: soft, BS +, non tender, slightly distended. Colostomy in place with liquid stool Extremities: no edema, lower extremities warm to touch. Neurologic: Non Focal.   Skin: No Rash.   Wounds: N/A.   Intake/Output from previous day:  Intake/Output Summary (Last 24 hours) at 03/04/15 1027 Last data filed at 03/04/15 0646  Gross per 24 hour  Intake     50 ml  Output   1350 ml  Net  -1300 ml     LAB RESULTS: CBC  Recent Labs Lab 03/01/15 1420 03/02/15 0633 03/03/15 0456 03/04/15 0553  WBC 16.0* 20.0* 12.5* 10.4  HGB 10.4* 9.4* 8.6* 8.5*  HCT 33.8* 30.2* 29.5* 28.0*  PLT 335 310 276 277  MCV 71.5* 73.3* 73.9* 73.7*  MCH 22.0* 22.8* 21.6*  22.4*  MCHC 30.8 31.1 29.2* 30.4  RDW 17.4* 18.1* 17.9* 17.9*  LYMPHSABS 2.2  --   --   --   MONOABS 1.6*  --   --   --   EOSABS 0.3  --   --   --   BASOSABS 0.0  --   --   --     Chemistries   Recent Labs Lab 03/01/15 1420 03/02/15 0633 03/03/15 0456 03/04/15 0553  NA 136 139 140 138  K 3.5 3.5 4.1 4.5  CL 95* 101 105 102  CO2 GLUCOSE 99 78 80 98  BUN 35* 33* 18 7  CREATININE 1.41* 1.45* 1.09 0.92  CALCIUM 9.0 8.6* 8.7* 8.8*  MG  --   --  1.9  --     CBG: No results for input(s): GLUCAP in the last 168  hours.  GFR Estimated Creatinine Clearance: 75.1 mL/min (by C-G formula based on Cr of 0.92).  Coagulation profile No results for input(s): INR, PROTIME in the last 168 hours.  Cardiac Enzymes No results for input(s): CKMB, TROPONINI, MYOGLOBIN in the last 168 hours.  Invalid input(s): CK  Invalid input(s): POCBNP No results for input(s): DDIMER in the last 72 hours. No results for input(s): HGBA1C in the last 72 hours. No results for input(s): CHOL, HDL, LDLCALC, TRIG, CHOLHDL, LDLDIRECT in the last 72 hours. No results for input(s): TSH, T4TOTAL, T3FREE, THYROIDAB in the last 72 hours.  Invalid input(s): FREET3  Recent Labs  03/03/15 0456  VITAMINB12 1457*  FOLATE 47.4  FERRITIN 45  TIBC 235*  IRON 7*  RETICCTPCT 1.4    Recent Labs  03/01/15 1420  LIPASE 32    Urine Studies No results for input(s): UHGB, CRYS in the last 72 hours.  Invalid input(s): UACOL, UAPR, USPG, UPH, UTP, UGL, UKET, UBIL, UNIT, UROB, ULEU, UEPI, UWBC, URBC, UBAC, CAST, UCOM, BILUA  MICROBIOLOGY: Recent Results (from the past 240 hour(s))  Culture, blood (routine x 2)     Status: None (Preliminary result)   Collection Time: 03/02/15 12:35 AM  Result Value Ref Range Status   Specimen Description BLOOD RIGHT ANTECUBITAL  Final   Special Requests IN PEDIATRIC BOTTLE 2CC  Final   Culture NO GROWTH 1 DAY  Final   Report Status PENDING  Incomplete  Culture, blood (routine x 2)     Status: None   Collection Time: 03/02/15 12:35 AM  Result Value Ref Range Status   Specimen Description BLOOD LEFT ANTECUBITAL  Final   Special Requests BOTTLES DRAWN AEROBIC ONLY 5CC  Final   Culture  Setup Time   Final    GRAM POSITIVE COCCI IN CLUSTERS AEROBIC BOTTLE ONLY CRITICAL RESULT CALLED TO, READ BACK BY AND VERIFIED WITH: D Carie,RN AT 1628 03/02/15 BY L BENFIELD    Culture   Final    STAPHYLOCOCCUS SPECIES (COAGULASE NEGATIVE) THE SIGNIFICANCE OF ISOLATING THIS ORGANISM FROM A SINGLE SET OF BLOOD  CULTURES WHEN MULTIPLE SETS ARE DRAWN IS UNCERTAIN. PLEASE NOTIFY THE MICROBIOLOGY DEPARTMENT WITHIN ONE WEEK IF SPECIATION AND SENSITIVITIES ARE REQUIRED.    Report Status 03/04/2015 FINAL  Final  MRSA PCR Screening     Status: None   Collection Time: 03/02/15  6:54 AM  Result Value Ref Range Status   MRSA by PCR NEGATIVE NEGATIVE Final    Comment:        The GeneXpert MRSA Assay (FDA approved for NASAL specimens only), is one component of a comprehensive  MRSA colonization surveillance program. It is not intended to diagnose MRSA infection nor to guide or monitor treatment for MRSA infections.   Urine culture     Status: None (Preliminary result)   Collection Time: 03/02/15  2:17 PM  Result Value Ref Range Status   Specimen Description URINE, SUPRAPUBIC  Final   Special Requests NONE  Final   Culture TOO YOUNG TO READ  Final   Report Status PENDING  Incomplete    RADIOLOGY STUDIES/RESULTS: Ct Abdomen Pelvis W Contrast  03/01/2015  CLINICAL DATA:  70 year old male with 1 week history of left flank pain an 8 prior history of small bowel obstruction. EXAM: CT ABDOMEN AND PELVIS WITH CONTRAST TECHNIQUE: Multidetector CT imaging of the abdomen and pelvis was performed using the standard protocol following bolus administration of intravenous contrast. CONTRAST:  OMNIPAQUE IOHEXOL 300 MG/ML  SOLN COMPARISON:  Prior CT abdomen/pelvis 10/21/2014 FINDINGS: Lower Chest: Bilateral lower lobe bronchiectasis with partial bronchial impaction, atelectasis and pleural parenchymal scarring. Trace tree-in-bud micro nodularity in the posterior aspect of the right middle lobe. Findings are similar compared to prior imaging. Visualized cardiac structures within normal limits for size. No pericardial effusion. Unremarkable visualized distal thoracic esophagus. Abdomen: Unremarkable CT appearance of the stomach, duodenum, spleen, adrenal glands and pancreas. Normal hepatic contour and morphology. No  discrete hepatic lesion. Portal veins are patent. Gallbladder is unremarkable. No intra or extrahepatic biliary ductal dilatation. Unremarkable appearance of the bilateral kidneys. No focal solid lesion, hydronephrosis or nephrolithiasis. 2.2 cm simple cyst in the interpolar right kidney. Multiple loops of abnormal dilated and fluid-filled small bowel throughout the abdomen. There is a parastomal hernia about the left lower quadrant colonic abscess containing a short-segment of the dilated small bowel, however this does not appear to be the source of the obstruction is the bowel is dilated on both sides of this segment. There is a focal transition point to normal caliber bowel in the right hemi abdomen (image 47 series 201) at a region of focal angulation suggesting underlying adhesions. The bowel wall mucosa enhances. No evidence of ischemic complication, perforation or free air. No significant mesenteric edema. No free fluid. Colonic diverticular disease without CT evidence of active inflammation. Prior sigmoid resection with Hartmann's pouch and left lower quadrant diverting ostomy. The Hartmann's pouch appears unremarkable. Colonic diverticular disease without CT evidence of active inflammation. Pelvis: Suprapubic catheter. No evidence of leak or complication. Unremarkable prostate gland. Musculoskeletal: No acute fracture or aggressive appearing lytic or blastic osseous lesion. Vascular: Extensive atherosclerotic vascular calcifications. No evidence of aneurysm. 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 Bulky calcified plaque results in at least moderate narrowing of the left common femoral artery. IMPRESSION: 1. Positive for small bowel obstruction with a focal transition points in the right lower quadrant likely secondary to underlying adhesions. No evidence of significant inflammation, bowel ischemic  change or perforation. 2. Surgical changes of prior sigmoidectomy with Hartmann's pouch and left lower quadrant diverting colostomy. 3. Parastomal hernia containing omental fat and a short segment of small bowel. No evidence of mechanical obstruction at this location. 4. Bilateral lower lobe bronchiectasis with areas of partial bronchial impaction, atelectasis and pleural parenchymal scarring. 5. Diverticulosis of the residual left colon without evidence of active diverticulitis. 6. 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 Does the  patient have clinical symptoms of left lower extremity claudication? Electronically Signed   By: Malachy Moan M.D.   On: 03/01/2015 16:42   Dg Abd 2 Views  03/04/2015  CLINICAL DATA:  Followup small bowel obstruction. EXAM: ABDOMEN - 2 VIEW COMPARISON:  03/03/2015. FINDINGS: Nasogastric tube tip in the proximal stomach. The side hole is in the distal esophagus. Multiple dilated small bowel loops with improvement. The largest caliber loop currently has a diameter of 4.0 cm. Gas and stool in normal caliber colon. Mild lumbar spine degenerative changes. No free peritoneal air. IMPRESSION: 1. Nasogastric tube side hole in the distal esophagus. It is recommended that this be advanced. 2. Improving partial small bowel obstruction. Electronically Signed   By: Beckie Salts M.D.   On: 03/04/2015 10:09   Dg Abd 2 Views  03/03/2015  CLINICAL DATA:  70 year old male with small bowel obstruction EXAM: ABDOMEN - 2 VIEW COMPARISON:  Prior abdominal radiograph 03/02/2015 FINDINGS: Diffuse gaseous distention and dilatation of small bowel throughout the mid and upper abdomen. Maximal small bowel diameter is 5.7 cm in the right upper quadrant. A nasogastric tube is in place with the tip overlying the stomach. Overall, the bowel gas pattern is similar to  slightly worse compared to yesterday. No evidence of free air. IMPRESSION: 1. Persistent, and perhaps slightly worse small bowel obstruction. Maximal small bowel diameter is 5.7 cm in the right upper quadrant. 2. No evidence of free air on the decubitus views. 3. Gastric tube remains in good position. Electronically Signed   By: Malachy Moan M.D.   On: 03/03/2015 09:45   Dg Abd Acute W/chest  03/02/2015  CLINICAL DATA:  Leukocytosis.  NG tube in place.  Vomiting. EXAM: DG ABDOMEN ACUTE W/ 1V CHEST COMPARISON:  Abdominal radiograph - 05/13/2013; chest radiograph - 04/26/2013; CT abdomen pelvis - 03/01/2015 FINDINGS: Grossly unchanged cardiac silhouette and mediastinal contours with extensive calcified atherosclerotic plaque within a tortuous and potentially mildly ectatic thoracic aorta. There is unchanged mild elevation of left hemidiaphragm. Unchanged left basilar heterogeneous opacities favored to represent atelectasis. No pleural effusion or pneumothorax. No evidence of edema. Shrapnel overlies the right mid lung, unchanged. Enteric tube tip projects over the expected location of the distal esophagus. There is moderate to marked gas distention of multiple loops of small bowel with index loop of small bowel with the right upper abdominal quadrant measuring approximately 4.6 cm in diameter. There is a paucity of distal colonic gas. No pneumoperitoneum, pneumatosis or portal venous gas. Excreted contrast from prior IV contrast-enhanced abdominal CT is seen within the urinary bladder. 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 No acute osseus abnormalities IMPRESSION: 1. No acute cardiopulmonary disease. 2. Enteric tube side port projected location distal esophagus. Advancement at least 7 cm is recommended. 3. Findings worrisome for high-grade small bowel obstruction. Continued attention on follow-up is recommended Electronically Signed   By: Simonne Come M.D.   On: 03/02/2015 08:08   Dg Abd Portable  1v  03/02/2015  CLINICAL DATA:  NG tube advanced. EXAM: PORTABLE ABDOMEN - 1 VIEW COMPARISON:  03/02/2015 FINDINGS: NG tube has been advanced with the tip now in the mid stomach. Gaseous distention of small bowel loops again noted, concerning for small bowel obstruction, not significantly changed. IMPRESSION: NG tube has been advanced with the tip now in the mid stomach. Electronically Signed   By: Charlett Nose M.D.   On: 03/02/2015 12:02    Leroy Sea, MD  [facility] Pager:[phone]  If  7PM-7AM, please contact night-coverage [email or web address] Password [email or web address] 03/04/2015, 10:27 AM   LOS: 3 days

## 2015-03-04 NOTE — Progress Notes (Signed)
Pt has pulled NG Tube out and refuses replacement. MD made aware.

## 2015-03-04 NOTE — Progress Notes (Signed)
Patient ID: Craig [name], male   DOB: 10-08-1945, 70 y.o.   MRN: [id number]    Subjective: Hungry, demanding food.Denies pain.  Objective: Vital signs in last 24 hours: Temp:  [98.2 F (36.8 C)-98.8 F (37.1 C)] 98.2 F (36.8 C) (02/19 0537) Pulse Rate:  [80-87] 80 (02/19 0537) Resp:  [16-19] 18 (02/19 0537) BP: (134-138)/(41-55) 134/55 mmHg (02/19 0537) SpO2:  [96 %-100 %] 99 % (02/19 0537) Last BM Date: 03/03/15  Intake/Output from previous day: 02/18 0701 - 02/19 0700 In: 50 [IV Piggyback:50] Out: 1750 [Urine:1750] Intake/Output this shift:    General appearance: alert, cooperative and no distress GI: mild distention. Soft and nontender. There is gasand stool in his colostomy bag.     Lab Results:   Recent Labs  03/03/15 0456 03/04/15 0553  WBC 12.5* 10.4  HGB 8.6* 8.5*  HCT 29.5* 28.0*  PLT 276 277   BMET  Recent Labs  03/03/15 0456 03/04/15 0553  NA 140 138  K 4.1 4.5  CL 105 102  CO2 28 29  GLUCOSE 80 98  BUN 18 7  CREATININE 1.09 0.92  CALCIUM 8.7* 8.8*     Studies/Results:  Abdominal x-rays today completed but not read.They appear to show significant decreased small bowel with contrast in the colon.  Dg Abd 2 Views  03/03/2015  CLINICAL DATA:  70 year old male with small bowel obstruction EXAM: ABDOMEN - 2 VIEW COMPARISON:  Prior abdominal radiograph 03/02/2015 FINDINGS: Diffuse gaseous distention and dilatation of small bowel throughout the mid and upper abdomen. Maximal small bowel diameter is 5.7 cm in the right upper quadrant. A nasogastric tube is in place with the tip overlying the stomach. Overall, the bowel gas pattern is similar to slightly worse compared to yesterday. No evidence of free air. IMPRESSION: 1. Persistent, and perhaps slightly worse small bowel obstruction. Maximal small bowel diameter is 5.7 cm in the right upper quadrant. 2. No evidence of free air on the decubitus views. 3. Gastric tube remains in good position.  Electronically Signed   By: Malachy Moan M.D.   On: 03/03/2015 09:45   Dg Abd Portable 1v  03/02/2015  CLINICAL DATA:  NG tube advanced. EXAM: PORTABLE ABDOMEN - 1 VIEW COMPARISON:  03/02/2015 FINDINGS: NG tube has been advanced with the tip now in the mid stomach. Gaseous distention of small bowel loops again noted, concerning for small bowel obstruction, not significantly changed. IMPRESSION: NG tube has been advanced with the tip now in the mid stomach. Electronically Signed   By: Charlett Nose M.D.   On: 03/02/2015 12:02    Anti-infectives: Anti-infectives    Start     Dose/Rate Route Frequency Ordered Stop   03/03/15 1500  ceFEPIme (MAXIPIME) 2 g in dextrose 5 % 50 mL IVPB     2 g 100 mL/hr over 30 Minutes Intravenous Every 12 hours 03/03/15 1428     03/03/15 0600  vancomycin (VANCOCIN) 500 mg in sodium chloride 0.9 % 100 mL IVPB  Status:  Discontinued     500 mg 100 mL/hr over 60 Minutes Intravenous Every 12 hours 03/02/15 1729 03/03/15 1247   03/02/15 1830  vancomycin (VANCOCIN) 1,250 mg in sodium chloride 0.9 % 250 mL IVPB     1,250 mg 166.7 mL/hr over 90 Minutes Intravenous  Once 03/02/15 1729 03/03/15 0032   03/02/15 1500  ceFEPIme (MAXIPIME) 2 g in dextrose 5 % 50 mL IVPB  Status:  Discontinued     2 g 100 mL/hr over 30  Minutes Intravenous Every 24 hours 03/02/15 1402 03/03/15 1428      Assessment/Plan: Small bowel obstruction likely secondary to adhesions. Clinically appears to be improving. Clamp NG tube today.Likely can remove and start diet tomorrow    LOS: 3 days    Sharmin Foulk T 03/04/2015

## 2015-03-05 ENCOUNTER — Inpatient Hospital Stay (HOSPITAL_COMMUNITY): Payer: Medicare Other

## 2015-03-05 DIAGNOSIS — Z515 Encounter for palliative care: Secondary | ICD-10-CM | POA: Insufficient documentation

## 2015-03-05 DIAGNOSIS — K565 Intestinal adhesions [bands] with obstruction (postprocedural) (postinfection): Principal | ICD-10-CM

## 2015-03-05 DIAGNOSIS — I480 Paroxysmal atrial fibrillation: Secondary | ICD-10-CM

## 2015-03-05 LAB — BASIC METABOLIC PANEL
ANION GAP: 9 (ref 5–15)
BUN: 5 mg/dL — AB (ref 6–20)
CHLORIDE: 99 mmol/L — AB (ref 101–111)
CO2: 27 mmol/L (ref 22–32)
CREATININE: 0.94 mg/dL (ref 0.61–1.24)
Calcium: 9.3 mg/dL (ref 8.9–10.3)
GLUCOSE: 79 mg/dL (ref 65–99)
POTASSIUM: 5.5 mmol/L — AB (ref 3.5–5.1)
Sodium: 135 mmol/L (ref 135–145)

## 2015-03-05 LAB — CBC
HCT: 27.7 % — ABNORMAL LOW (ref 39.0–52.0)
Hemoglobin: 8.6 g/dL — ABNORMAL LOW (ref 13.0–17.0)
MCH: 22.4 pg — ABNORMAL LOW (ref 26.0–34.0)
MCHC: 31 g/dL (ref 30.0–36.0)
MCV: 72.1 fL — ABNORMAL LOW (ref 78.0–100.0)
Platelets: 328 K/uL (ref 150–400)
RBC: 3.84 MIL/uL — ABNORMAL LOW (ref 4.22–5.81)
RDW: 17.9 % — ABNORMAL HIGH (ref 11.5–15.5)
WBC: 10.5 K/uL (ref 4.0–10.5)

## 2015-03-05 LAB — MRSA PCR SCREENING: MRSA by PCR: POSITIVE — AB

## 2015-03-05 MED ORDER — BOOST / RESOURCE BREEZE PO LIQD
1.0000 | Freq: Three times a day (TID) | ORAL | Status: DC
  Administered 2015-03-05: 1 via ORAL

## 2015-03-05 MED ORDER — FUROSEMIDE 10 MG/ML IJ SOLN
20.0000 mg | Freq: Once | INTRAMUSCULAR | Status: AC
  Administered 2015-03-05: 20 mg via INTRAVENOUS
  Filled 2015-03-05: qty 2

## 2015-03-05 MED ORDER — SODIUM CHLORIDE 0.9 % IV BOLUS (SEPSIS)
500.0000 mL | Freq: Once | INTRAVENOUS | Status: AC
  Administered 2015-03-05: 500 mL via INTRAVENOUS

## 2015-03-05 MED ORDER — SODIUM POLYSTYRENE SULFONATE 15 GM/60ML PO SUSP
15.0000 g | Freq: Once | ORAL | Status: DC
  Filled 2015-03-05: qty 60

## 2015-03-05 NOTE — Care Management Important Message (Signed)
Important Message  Patient Details  Name: Craig [name] MRN: [id number] Date of Birth: [birth year]   Medicare Important Message Given:  Yes    Lawerance Sabal, RN 03/05/2015, 1:52 PMImportant Message  Patient Details  Name: Craig [name] MRN: [id number] Date of Birth: 1945/03/03   Medicare Important Message Given:  Yes    Lawerance Sabal, RN 03/05/2015, 1:51 PM

## 2015-03-05 NOTE — Progress Notes (Signed)
PATIENT DETAILS Name: Craig [name] Age: 70 y.o. 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  Subjective:  Patient in bed, no headache, no fever or chills, no chest abdominal pain, no nausea, wants to eat. Focal weakness.  Assessment/Plan:  Bowel obstruction: Liquid stool in colostomy bag-abdomen is less distended. Improved with supportive measures, general surgery following, per general surgery clamp NG tube for 24 hours however patient pulled NG tube despite being counseled on 03/04/2015, the better with liquid stool in ostomy bag, start on clears per general surgery on 03/05/2015.  AKI (acute kidney injury): Likely prerenal azotemia-from dehydration/poor oral intake. Resolved, continue to cautiously hydrate.   UTI related to chronic indwelling suprapubic catheter: Complicated UTI, suprapubic catheter was changed on 03/03/2015, currently on empiric IV antibiotic monitor.  Coagulase-negative bacteremia 1/2: Suspect a contaminant and not real infection. Discontinued vancomycin.  Paroxysmal A-fib with [place] vasc 2 score of greater than 3: Currently in sinus rhythm, very poor long-term candidate for anticoagulation. Continue aspirin. Defer long-term enteric regulation to PCP and primary cardiologist.  Hypothyroidism: Continue IV levothyroxine-once oral intake stabilizes-convert to oral regimen.   GERD (gastroesophageal reflux disease): Continue PPI   Depression: Continue usual  medications.  COPD (chronic obstructive pulmonary disease): Continue bronchodilators, lungs are clear-this problem appears currently inactive at this time.   Anemia of chronic disease: Has chronic anemia at baseline-suspect that this is predominantly from chronic disease.Iron panel consistent with combined Chronic disease and Fe def. Follow .  Peripheral vascular disease: CT of the abdomen incidentally demonstrates extensive atherosclerotic vascular calcifications including high-grade stenosis of the left common iliac artery, moderate stenosis of the left femoral artery-suspect these are chronic issues. He has extensive muscle atrophy in his bilateral lower extremities. He is minimally ambulatory if it best-and mostly bed to wheelchair bound. Furthermore, both lower extremities appear warm, dorsalis pedis pulses present. Given poor overall condition/failure to thrive syndrome with protein calorie malnutrition-suspect best managed medically and likely not a candidate for aggressive care. Continue aspirin for secondary prevention with outpatient follow-up with PCP, recommend to PCP kindly consider one-time outpatient vascular surgery input.  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  Mild hyperkalemia - stop IVF, Lasix and Kayaxalate, repeat BMP.  Failure to thrive in adult/Protein-calorie malnutrition, severe : nutrition consult when oral intake more stable. PT evaluation. Very poor overall prognoses.  Palliative care: Mostly bedbound-very frail patient with multiple medical comorbidities. Did have a palliative care evaluation last admission-DO NOT RESUSCITATE already in place-this was reconfirmed by this M.D.on admission with patient. Very poor overall prognoses, suspect best served by conservative management. If deteriorates or Aggressive care is required, will need involvement of palliative care services.   Disposition: Likely SNF on  03/06/2015 if tolerates clears  Antimicrobial agents  See below  Anti-infectives    Start     Dose/Rate Route Frequency Ordered Stop   03/03/15 1500  ceFEPIme (MAXIPIME) 2 g in dextrose 5 % 50 mL IVPB     2 g 100 mL/hr over 30  Minutes Intravenous Every 12 hours 03/03/15 1428     03/03/15 0600  vancomycin (VANCOCIN) 500 mg in sodium chloride 0.9 % 100 mL IVPB  Status:  Discontinued     500 mg 100 mL/hr over 60 Minutes Intravenous Every 12 hours 03/02/15 1729 03/03/15 1247   03/02/15 1830  vancomycin (VANCOCIN) 1,250 mg in sodium chloride 0.9 % 250 mL IVPB     1,250 mg 166.7 mL/hr over 90 Minutes Intravenous  Once 03/02/15 1729 03/03/15 0032   03/02/15 1500  ceFEPIme (MAXIPIME) 2 g in dextrose 5 % 50 mL IVPB  Status:  Discontinued     2 g 100 mL/hr over 30 Minutes Intravenous Every 24 hours 03/02/15 1402 03/03/15 1428      DVT Prophylaxis: Prophylactic Heparin   Code Status: Full code   Family Communication Previous physician discussed with Toshiyuki Fredell [phone] over the phone 2/17-  none at bedside today  Procedures: None  CONSULTS:  general surgery  Time spent 25 minutes-Greater than 50% of this time was spent in counseling, explanation of diagnosis, planning of further management, and coordination of care.  MEDICATIONS: Scheduled Meds: . amiodarone  200 mg Oral Daily  . antiseptic oral rinse  7 mL Mouth Rinse q12n4p  . aspirin EC  81 mg Oral Daily  . ceFEPime (MAXIPIME) IV  2 g Intravenous Q12H  . chlorhexidine  15 mL Mouth Rinse BID  . escitalopram  10 mg Oral Daily  . heparin  5,000 Units Subcutaneous 3 times per day  . lamoTRIgine  25 mg Oral Daily  . levothyroxine  37.5 mcg Intravenous Daily  . mometasone-formoterol  2 puff Inhalation BID  . pantoprazole (PROTONIX) IV  40 mg Intravenous Q24H   Continuous Infusions: . dextrose 5 % 1,000 mL with potassium chloride 40 mEq infusion 100 mL/hr at 03/05/15 0842   PRN Meds:.acetaminophen **OR**  acetaminophen, albuterol, guaiFENesin-dextromethorphan, morphine injection, ondansetron **OR** ondansetron (ZOFRAN) IV    PHYSICAL EXAM: Vital signs in last 24 hours: Filed Vitals:   03/04/15 1436 03/04/15 2121 03/04/15 2136 03/05/15 0632  BP: 120/51 137/61  144/52  Pulse: 78 80  84  Temp: 98.6 F (37 C) 97.8 F (36.6 C)  98.5 F (36.9 C)  TempSrc:  Oral  Oral  Resp: Height:      Weight:      SpO2: 98% 100% 99% 93%    Weight change:  Filed Weights   03/01/15 1349 03/01/15 2301  Weight: 71.215 kg (157 lb) 69.1 kg (152 lb 5.4 oz)   Body mass index is 18.74 kg/(m^2).   Gen Exam: Awake and alert with clear speech. NGT in place Neck: Supple, No JVD.   Chest: B/L Clear.   CVS: S1 S2 regular Abdomen: soft, BS +, non tender, slightly distended. Colostomy in place with liquid stool Extremities: no edema, lower extremities warm to touch. Neurologic: Non Focal.   Skin: No Rash.   Wounds: N/A.   Intake/Output from previous day:  Intake/Output Summary (Last 24 hours) at 03/05/15 0845 Last data filed at 03/05/15 0657  Gross per 24 hour  Intake      0 ml  Output   3600 ml  Net  -3600 ml     LAB RESULTS: CBC  Recent Labs Lab 03/01/15 1420 03/02/15 0633 03/03/15 0456 03/04/15 0553 03/05/15 0708  WBC 16.0* 20.0* 12.5* 10.4 10.5  HGB 10.4* 9.4* 8.6* 8.5* 8.6*  HCT 33.8* 30.2* 29.5* 28.0* 27.7*  PLT 335 310 276  277 328  MCV 71.5* 73.3* 73.9* 73.7* 72.1*  MCH 22.0* 22.8* 21.6* 22.4* 22.4*  MCHC 30.8 31.1 29.2* 30.4 31.0  RDW 17.4* 18.1* 17.9* 17.9* 17.9*  LYMPHSABS 2.2  --   --   --   --   MONOABS 1.6*  --   --   --   --   EOSABS 0.3  --   --   --   --   BASOSABS 0.0  --   --   --   --     Chemistries   Recent Labs Lab 03/01/15 1420 03/02/15 0633 03/03/15 0456 03/04/15 0553 03/05/15 0708  NA 136 139 140 138 135  K 3.5 3.5 4.1 4.5 5.5*  CL 95* 101 105 102 99*  CO2 GLUCOSE 99 78 80 98 79  BUN 35* 33* 18 7 5*  CREATININE 1.41*  1.45* 1.09 0.92 0.94  CALCIUM 9.0 8.6* 8.7* 8.8* 9.3  MG  --   --  1.9  --   --     CBG: No results for input(s): GLUCAP in the last 168 hours.  GFR Estimated Creatinine Clearance: 73.5 mL/min (by C-G formula based on Cr of 0.94).  Coagulation profile No results for input(s): INR, PROTIME in the last 168 hours.  Cardiac Enzymes No results for input(s): CKMB, TROPONINI, MYOGLOBIN in the last 168 hours.  Invalid input(s): CK  Invalid input(s): POCBNP No results for input(s): DDIMER in the last 72 hours. No results for input(s): HGBA1C in the last 72 hours. No results for input(s): CHOL, HDL, LDLCALC, TRIG, CHOLHDL, LDLDIRECT in the last 72 hours. No results for input(s): TSH, T4TOTAL, T3FREE, THYROIDAB in the last 72 hours.  Invalid input(s): FREET3  Recent Labs  03/03/15 0456  VITAMINB12 1457*  FOLATE 47.4  FERRITIN 45  TIBC 235*  IRON 7*  RETICCTPCT 1.4   No results for input(s): LIPASE, AMYLASE in the last 72 hours.  Urine Studies No results for input(s): UHGB, CRYS in the last 72 hours.  Invalid input(s): UACOL, UAPR, USPG, UPH, UTP, UGL, UKET, UBIL, UNIT, UROB, ULEU, UEPI, UWBC, URBC, UBAC, CAST, UCOM, BILUA  MICROBIOLOGY: Recent Results (from the past 240 hour(s))  Culture, blood (routine x 2)     Status: None (Preliminary result)   Collection Time: 03/02/15 12:35 AM  Result Value Ref Range Status   Specimen Description BLOOD RIGHT ANTECUBITAL  Final   Special Requests IN PEDIATRIC BOTTLE 2CC  Final   Culture NO GROWTH 2 DAYS  Final   Report Status PENDING  Incomplete  Culture, blood (routine x 2)     Status: None   Collection Time: 03/02/15 12:35 AM  Result Value Ref Range Status   Specimen Description BLOOD LEFT ANTECUBITAL  Final   Special Requests BOTTLES DRAWN AEROBIC ONLY 5CC  Final   Culture  Setup Time   Final    GRAM POSITIVE COCCI IN CLUSTERS AEROBIC BOTTLE ONLY CRITICAL RESULT CALLED TO, READ BACK BY AND VERIFIED WITH: D Herder,RN AT 1628  03/02/15 BY L BENFIELD    Culture   Final    STAPHYLOCOCCUS SPECIES (COAGULASE NEGATIVE) THE SIGNIFICANCE OF ISOLATING THIS ORGANISM FROM A SINGLE SET OF BLOOD CULTURES WHEN MULTIPLE SETS ARE DRAWN IS UNCERTAIN. PLEASE NOTIFY THE MICROBIOLOGY DEPARTMENT WITHIN ONE WEEK IF SPECIATION AND SENSITIVITIES ARE REQUIRED.    Report Status 03/04/2015 FINAL  Final  MRSA PCR Screening     Status: None   Collection Time: 03/02/15  6:54  AM  Result Value Ref Range Status   MRSA by PCR NEGATIVE NEGATIVE Final    Comment:        The GeneXpert MRSA Assay (FDA approved for NASAL specimens only), is one component of a comprehensive MRSA colonization surveillance program. It is not intended to diagnose MRSA infection nor to guide or monitor treatment for MRSA infections.   Urine culture     Status: None (Preliminary result)   Collection Time: 03/02/15  2:17 PM  Result Value Ref Range Status   Specimen Description URINE, SUPRAPUBIC  Final   Special Requests NONE  Final   Culture 40,000 COLONIES/ml GRAM NEGATIVE RODS  Final   Report Status PENDING  Incomplete    RADIOLOGY STUDIES/RESULTS: Ct Abdomen Pelvis W Contrast  03/01/2015  CLINICAL DATA:  70 year old male with 1 week history of left flank pain an 8 prior history of small bowel obstruction. EXAM: CT ABDOMEN AND PELVIS WITH CONTRAST TECHNIQUE: Multidetector CT imaging of the abdomen and pelvis was performed using the standard protocol following bolus administration of intravenous contrast. CONTRAST:  OMNIPAQUE IOHEXOL 300 MG/ML  SOLN COMPARISON:  Prior CT abdomen/pelvis 10/21/2014 FINDINGS: Lower Chest: Bilateral lower lobe bronchiectasis with partial bronchial impaction, atelectasis and pleural parenchymal scarring. Trace tree-in-bud micro nodularity in the posterior aspect of the right middle lobe. Findings are similar compared to prior imaging. Visualized cardiac structures within normal limits for size. No pericardial effusion. Unremarkable  visualized distal thoracic esophagus. Abdomen: Unremarkable CT appearance of the stomach, duodenum, spleen, adrenal glands and pancreas. Normal hepatic contour and morphology. No discrete hepatic lesion. Portal veins are patent. Gallbladder is unremarkable. No intra or extrahepatic biliary ductal dilatation. Unremarkable appearance of the bilateral kidneys. No focal solid lesion, hydronephrosis or nephrolithiasis. 2.2 cm simple cyst in the interpolar right kidney. Multiple loops of abnormal dilated and fluid-filled small bowel throughout the abdomen. There is a parastomal hernia about the left lower quadrant colonic abscess containing a short-segment of the dilated small bowel, however this does not appear to be the source of the obstruction is the bowel is dilated on both sides of this segment. There is a focal transition point to normal caliber bowel in the right hemi abdomen (image 47 series 201) at a region of focal angulation suggesting underlying adhesions. The bowel wall mucosa enhances. No evidence of ischemic complication, perforation or free air. No significant mesenteric edema. No free fluid. Colonic diverticular disease without CT evidence of active inflammation. Prior sigmoid resection with Hartmann's pouch and left lower quadrant diverting ostomy. The Hartmann's pouch appears unremarkable. Colonic diverticular disease without CT evidence of active inflammation. Pelvis: Suprapubic catheter. No evidence of leak or complication. Unremarkable prostate gland. Musculoskeletal: No acute fracture or aggressive appearing lytic or blastic osseous lesion. Vascular: Extensive atherosclerotic vascular calcifications. No evidence of aneurysm. 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 Bulky calcified plaque results in at least moderate narrowing of the left common femoral artery. IMPRESSION: 1. Positive for  small bowel obstruction with a focal transition points in the right lower quadrant likely secondary to underlying adhesions. No evidence of significant inflammation, bowel ischemic change or perforation. 2. Surgical changes of prior sigmoidectomy with Hartmann's pouch and left lower quadrant diverting colostomy. 3. Parastomal hernia containing omental fat and a short segment of small bowel. No evidence of mechanical obstruction at this location. 4. Bilateral lower lobe bronchiectasis with areas of partial bronchial impaction, atelectasis and pleural  parenchymal scarring. 5. Diverticulosis of the residual left colon without evidence of active diverticulitis. 6. 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 Does the patient have clinical symptoms of left lower extremity claudication? Electronically Signed   By: Malachy Moan M.D.   On: 03/01/2015 16:42   Dg Abd 2 Views  03/05/2015  CLINICAL DATA:  Abdominal pain. EXAM: ABDOMEN - 2 VIEW COMPARISON:  03/04/2015.  CT 03/01/2015. FINDINGS: NG tube has been removed. Persistent dilated loops of small bowel noted. Colonic gas pattern is normal. No free air. No acute bony abnormality. IMPRESSION: 1. Interim removal of NG tube. 2. Persistent dilated loops of small bowel are noted. Colonic gas pattern is normal. No free air. Electronically Signed   By: Maisie Fus  Register   On: 03/05/2015 08:21   Dg Abd 2 Views  03/04/2015  CLINICAL DATA:  Followup small bowel obstruction. EXAM: ABDOMEN - 2 VIEW COMPARISON:  03/03/2015. FINDINGS: Nasogastric tube tip in the proximal stomach. The side hole is in the distal esophagus. Multiple dilated small bowel loops with improvement. The largest caliber loop currently has a diameter of 4.0 cm. Gas and stool in normal caliber colon. Mild lumbar spine degenerative changes. No free peritoneal air.  IMPRESSION: 1. Nasogastric tube side hole in the distal esophagus. It is recommended that this be advanced. 2. Improving partial small bowel obstruction. Electronically Signed   By: Beckie Salts M.D.   On: 03/04/2015 10:09   Dg Abd 2 Views  03/03/2015  CLINICAL DATA:  70 year old male with small bowel obstruction EXAM: ABDOMEN - 2 VIEW COMPARISON:  Prior abdominal radiograph 03/02/2015 FINDINGS: Diffuse gaseous distention and dilatation of small bowel throughout the mid and upper abdomen. Maximal small bowel diameter is 5.7 cm in the right upper quadrant. A nasogastric tube is in place with the tip overlying the stomach. Overall, the bowel gas pattern is similar to slightly worse compared to yesterday. No evidence of free air. IMPRESSION: 1. Persistent, and perhaps slightly worse small bowel obstruction. Maximal small bowel diameter is 5.7 cm in the right upper quadrant. 2. No evidence of free air on the decubitus views. 3. Gastric tube remains in good position. Electronically Signed   By: Malachy Moan M.D.   On: 03/03/2015 09:45   Dg Abd Acute W/chest  03/02/2015  CLINICAL DATA:  Leukocytosis.  NG tube in place.  Vomiting. EXAM: DG ABDOMEN ACUTE W/ 1V CHEST COMPARISON:  Abdominal radiograph - 05/13/2013; chest radiograph - 04/26/2013; CT abdomen pelvis - 03/01/2015 FINDINGS: Grossly unchanged cardiac silhouette and mediastinal contours with extensive calcified atherosclerotic plaque within a tortuous and potentially mildly ectatic thoracic aorta. There is unchanged mild elevation of left hemidiaphragm. Unchanged left basilar heterogeneous opacities favored to represent atelectasis. No pleural effusion or pneumothorax. No evidence of edema. Shrapnel overlies the right mid lung, unchanged. Enteric tube tip projects over the expected location of the distal esophagus. There is moderate to marked gas distention of multiple loops of small bowel with index loop of small bowel with the right upper abdominal  quadrant measuring approximately 4.6 cm in diameter. There is a paucity of distal colonic gas. No pneumoperitoneum, pneumatosis or portal venous gas. Excreted contrast from prior IV contrast-enhanced abdominal CT is seen within the urinary bladder. 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 No acute osseus abnormalities IMPRESSION: 1. No acute cardiopulmonary disease. 2. Enteric tube side port projected location distal esophagus.  Advancement at least 7 cm is recommended. 3. Findings worrisome for high-grade small bowel obstruction. Continued attention on follow-up is recommended Electronically Signed   By: Simonne Come M.D.   On: 03/02/2015 08:08   Dg Abd Portable 1v  03/02/2015  CLINICAL DATA:  NG tube advanced. EXAM: PORTABLE ABDOMEN - 1 VIEW COMPARISON:  03/02/2015 FINDINGS: NG tube has been advanced with the tip now in the mid stomach. Gaseous distention of small bowel loops again noted, concerning for small bowel obstruction, not significantly changed. IMPRESSION: NG tube has been advanced with the tip now in the mid stomach. Electronically Signed   By: Charlett Nose M.D.   On: 03/02/2015 12:02    Leroy Sea, MD  [facility] Pager:[phone]  If 7PM-7AM, please contact night-coverage [email or web address] Password [email or web address] 03/05/2015, 8:45 AM   LOS: 4 days

## 2015-03-05 NOTE — Clinical Social Work Note (Signed)
Clinical Social Work Assessment  Patient Details  Name: Craig [name] MRN: [id number] Date of Birth: 1945-06-08  Date of referral:  03/05/15               Reason for consult:  Facility Placement                Permission sought to share information with:  Family Supports, Chartered certified accountant granted to share information::  Yes, Verbal Permission Granted  Name::        Agency::  [facility]  Relationship::     Contact Information:     Housing/Transportation Living arrangements for the past 2 months:  [place] of Information:  Patient Patient Interpreter Needed:  None Criminal Activity/Legal Involvement Pertinent to Current Situation/Hospitalization:  No - Comment as needed Significant Relationships:  Friend Lives with:  Facility Resident Do you feel safe going back to the place where you live?  Yes Need for family participation in patient care:  No (Coment)  Care giving concerns:  No questions or concerns at this time- LTC resident at [facility] Worker assessment / plan: CSW met with pt to discuss DC disposition when pt is stable for DC.  Pt states he has been living at [facility] for past 18 months and his plan is to return to his facility when ready.  Employment status:  Retired Forensic scientist:  Medicare PT Recommendations:  Not assessed at this time [place] / Referral to community resources:  [place]  Patient/Family's Response to care:  Agreeable to return to SNF.  Patient/Family's Understanding of and Emotional Response to Diagnosis, Current Treatment, and Prognosis: no questions or concerns at this time.  Emotional Assessment Appearance:  Appears stated age Attitude/Demeanor/Rapport:    Affect (typically observed):  Appropriate Orientation:  Oriented to Situation, Oriented to  Time, Oriented to Place, Oriented to Self Alcohol / Substance use:  Not Applicable Psych  involvement (Current and /or in the community):  No (Comment)  Discharge Needs  Concerns to be addressed:  No discharge needs identified Readmission within the last 30 days:  No Current discharge risk:  None Barriers to Discharge:  Continued Medical Work up   [employer], LCSW 03/05/2015, 3:34 PM

## 2015-03-05 NOTE — Progress Notes (Signed)
Subjective: Hungry, no nausea  Objective: Vital signs in last 24 hours: Temp:  [97.8 F (36.6 C)-98.6 F (37 C)] 98.5 F (36.9 C) 2022/03/23 4098) Pulse Rate:  [78-84] 84 23-Mar-2022 0632) Resp:  [18-20] 18 2022-03-23 0632) BP: (120-144)/(51-61) 144/52 mmHg 2022-03-23 0632) SpO2:  [93 %-100 %] 93 % 03/23/22 1191) Last BM Date: 03/03/15  Intake/Output from previous day: 02/19 0701 - 23-Mar-2022 0700 In: 0  Out: 3600 [Urine:2500; Emesis/NG output:1100] Intake/Output this shift:    General appearance: cooperative GI: soft, NT, +BS, air and liquid stool in bag  Lab Results:   Recent Labs  03/04/15 0553 03-24-15 0708  WBC 10.4 10.5  HGB 8.5* 8.6*  HCT 28.0* 27.7*  PLT 277 328   BMET  Recent Labs  03/04/15 0553 March 24, 2015 0708  NA 138 135  K 4.5 5.5*  CL 102 99*  CO2 29 27  GLUCOSE 98 79  BUN 7 5*  CREATININE 0.92 0.94  CALCIUM 8.8* 9.3   PT/INR No results for input(s): LABPROT, INR in the last 72 hours. ABG No results for input(s): PHART, HCO3 in the last 72 hours.  Invalid input(s): PCO2, PO2  Studies/Results: Dg Abd 2 Views  March 24, 2015  CLINICAL DATA:  Abdominal pain. EXAM: ABDOMEN - 2 VIEW COMPARISON:  03/04/2015.  CT 03/01/2015. FINDINGS: NG tube has been removed. Persistent dilated loops of small bowel noted. Colonic gas pattern is normal. No free air. No acute bony abnormality. IMPRESSION: 1. Interim removal of NG tube. 2. Persistent dilated loops of small bowel are noted. Colonic gas pattern is normal. No free air. Electronically Signed   By: Maisie Fus  Register   On: March 24, 2015 08:21   Dg Abd 2 Views  03/04/2015  CLINICAL DATA:  Followup small bowel obstruction. EXAM: ABDOMEN - 2 VIEW COMPARISON:  03/03/2015. FINDINGS: Nasogastric tube tip in the proximal stomach. The side hole is in the distal esophagus. Multiple dilated small bowel loops with improvement. The largest caliber loop currently has a diameter of 4.0 cm. Gas and stool in normal caliber colon. Mild lumbar spine  degenerative changes. No free peritoneal air. IMPRESSION: 1. Nasogastric tube side hole in the distal esophagus. It is recommended that this be advanced. 2. Improving partial small bowel obstruction. Electronically Signed   By: Beckie Salts M.D.   On: 03/04/2015 10:09   Dg Abd 2 Views  03/03/2015  CLINICAL DATA:  70 year old male with small bowel obstruction EXAM: ABDOMEN - 2 VIEW COMPARISON:  Prior abdominal radiograph 03/02/2015 FINDINGS: Diffuse gaseous distention and dilatation of small bowel throughout the mid and upper abdomen. Maximal small bowel diameter is 5.7 cm in the right upper quadrant. A nasogastric tube is in place with the tip overlying the stomach. Overall, the bowel gas pattern is similar to slightly worse compared to yesterday. No evidence of free air. IMPRESSION: 1. Persistent, and perhaps slightly worse small bowel obstruction. Maximal small bowel diameter is 5.7 cm in the right upper quadrant. 2. No evidence of free air on the decubitus views. 3. Gastric tube remains in good position. Electronically Signed   By: Malachy Moan M.D.   On: 03/03/2015 09:45    Anti-infectives: Anti-infectives    Start     Dose/Rate Route Frequency Ordered Stop   03/03/15 1500  ceFEPIme (MAXIPIME) 2 g in dextrose 5 % 50 mL IVPB     2 g 100 mL/hr over 30 Minutes Intravenous Every 12 hours 03/03/15 1428     03/03/15 0600  vancomycin (VANCOCIN) 500 mg in sodium  chloride 0.9 % 100 mL IVPB  Status:  Discontinued     500 mg 100 mL/hr over 60 Minutes Intravenous Every 12 hours 03/02/15 1729 03/03/15 1247   03/02/15 1830  vancomycin (VANCOCIN) 1,250 mg in sodium chloride 0.9 % 250 mL IVPB     1,250 mg 166.7 mL/hr over 90 Minutes Intravenous  Once 03/02/15 1729 03/03/15 0032   03/02/15 1500  ceFEPIme (MAXIPIME) 2 g in dextrose 5 % 50 mL IVPB  Status:  Discontinued     2 g 100 mL/hr over 30 Minutes Intravenous Every 24 hours 03/02/15 1402 03/03/15 1428      Assessment/Plan: PSBO - improving  gradually, start clears  LOS: 4 days    Harlym Gehling E 03/05/2015

## 2015-03-05 NOTE — Progress Notes (Addendum)
Pt refuses to let nurse swab nostrils for MRSA

## 2015-03-05 NOTE — Progress Notes (Signed)
Nutrition Follow-up  DOCUMENTATION CODES:   Severe malnutrition in context of chronic illness  INTERVENTION:   Boost Breeze po TID, each supplement provides 250 kcal and 9 grams of protein  NUTRITION DIAGNOSIS:   Malnutrition related to chronic illness as evidenced by severe depletion of body fat, severe depletion of muscle mass.  Ongoing  GOAL:   Patient will meet greater than or equal to 90% of their needs  Unmet  MONITOR:   Diet advancement, Labs, Weight trends, Skin, I & O's  REASON FOR ASSESSMENT:   Consult Assessment of nutrition requirement/status  ASSESSMENT:   Craig [name] is a 70 y.o. 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 Please note-patient is not reliable historian, most of this history is obtained after speaking with the patient, reviewing chart . Per patient he has been having pain for the past 1 week in his lower abdomen. He denies any vomiting. Pain is described as colicky, expressed that at its worst. No radiation of the pain. No particular lifting or relieving factors. For the past few days, he has had exceedingly poor appetite and has felt much will be given his usual self. Abdominal x-ray done at his skilled nursing facility showed a possible bowel obstruction, patient was subsequently transferred to [facility] and I was asked to admit this patient for further evaluation and treatment.  NGT was clamped on 03/04/15 and pt pulled out later that day, refusing replacement. Pt was just advanced to a clear liquid diet this morning. Staff report that pt is eager to eat. RD will add supplement due to malnutrition and increased nutrient needs for wound healing.   Per MD notes, potential to d/c  back to SNF on 03/06/15.   Palliative care consult pending; pt with poor prognosis.   Labs reviewed: K: 5.5.   Diet Order:  Diet clear liquid Room service appropriate?: Yes; Fluid consistency:: Thin  Skin:  Wound (see comment) (Unstageable lt heel)  Last BM:  03/05/15  Height:   Ht Readings from Last 1 Encounters:  03/01/15 6' 3.6" (1.92 m)    Weight:   Wt Readings from Last 1 Encounters:  03/01/15 152 lb 5.4 oz (69.1 kg)    Ideal Body Weight:  91.8 kg  BMI:  Body mass index is 18.74 kg/(m^2).  Estimated Nutritional Needs:   Kcal:  2000-2200  Protein:  105-120 grams  Fluid:  2.0-2.2 L  EDUCATION NEEDS:   No education needs identified at this time  Reno Clasby A. Mayford Knife, RD, LDN, CDE Pager: [phone] After hours Pager: [phone]

## 2015-03-05 NOTE — NC FL2 (Signed)
[place] MEDICAID FL2 LEVEL OF CARE SCREENING TOOL     IDENTIFICATION  Patient Name: Craig [name] Birthdate: Apr 06, 1945 Sex: male Admission Date (Current Location): 03/01/2015  [facility] and [place] Number:  Producer, television/film/video and Address:  [facility], [address] [place], [place] [address]      Provider Number: [id number]  Attending Physician Name and Address:  Leroy Sea, MD  Relative Name and Phone Number:       Current Level of Care: Hospital Recommended Level of Care: Skilled Nursing Facility Prior Approval Number:    Date Approved/Denied:   PASRR Number:    Discharge Plan: SNF    Current Diagnoses: Patient Active Problem List   Diagnosis Date Noted  . Palliative care encounter   . Pressure ulcer 03/02/2015  . Nausea and vomiting 03/01/2015  . Ileus, unspecified (HCC) 03/01/2015  . Small bowel obstruction, partial (HCC) 03/01/2015  . Dehydration 03/01/2015  . AKI (acute kidney injury) (HCC) 03/01/2015  . Bowel obstruction (HCC) 03/01/2015  . Peripheral vascular disease (HCC) 03/01/2015  . Failure to thrive in adult 03/01/2015  . Loss of weight 12/31/2014  . Aspiration pneumonia (HCC) 10/21/2014  . HCAP (healthcare-associated pneumonia) 10/21/2014  . Leukocytosis 10/21/2014  . Protein-calorie malnutrition, severe (HCC) 10/21/2014  . Anemia of chronic disease 10/21/2014  . GERD without esophagitis 10/21/2014  . Encounter for palliative care   . UTI (urinary tract infection) 08/28/2014  . Urine retention 08/28/2014  . Chronic pain 01/29/2014  . Hypokalemia 12/20/2013  . Anemia 04/29/2013  . HTN (hypertension) 04/27/2013  . COPD (chronic obstructive pulmonary disease) (HCC) 04/27/2013  . Dysphagia 04/27/2013  . Insomnia 04/10/2013  . Hypothyroidism 03/05/2013  . Depression 03/05/2013  . A-fib (HCC) 03/05/2013  . GERD (gastroesophageal reflux disease) 03/05/2013    Orientation RESPIRATION BLADDER Height & Weight      Self, Time, Situation, Place  O2 (2L [place]) Indwelling catheter Weight: 152 lb 5.4 oz (69.1 kg) Height:  6' 3.6" (192 cm)  BEHAVIORAL SYMPTOMS/MOOD NEUROLOGICAL BOWEL NUTRITION STATUS        Diet  AMBULATORY STATUS COMMUNICATION OF NEEDS Skin   Total Care Verbally PU Stage and Appropriate Care (unstageable PU (heel))                       Personal Care Assistance Level of Assistance  Bathing, Dressing Bathing Assistance: Maximum assistance   Dressing Assistance: Maximum assistance     Functional Limitations Info             SPECIAL CARE FACTORS FREQUENCY                       Contractures      Additional Factors Info  Code Status, Allergies, Isolation Precautions Code Status Info: DNR Allergies Info: NKA     Isolation Precautions Info: MRSA     Current Medications (03/05/2015):  This is the current hospital active medication list Current Facility-Administered Medications  Medication Dose Route Frequency Provider Last Rate Last Dose  . acetaminophen (TYLENOL) tablet 650 mg  650 mg Oral Q6H PRN Shanker Levora Dredge, MD       Or  . acetaminophen (TYLENOL) suppository 650 mg  650 mg Rectal Q6H PRN Shanker Levora Dredge, MD      . albuterol (PROVENTIL) (2.5 MG/3ML) 0.083% nebulizer solution 2.5 mg  2.5 mg Nebulization Q2H PRN Shanker Levora Dredge, MD      . amiodarone (PACERONE)  tablet 200 mg  200 mg Oral Daily Maretta Bees, MD   200 mg at 03/05/15 0957  . antiseptic oral rinse (CPC / CETYLPYRIDINIUM CHLORIDE 0.05%) solution 7 mL  7 mL Mouth Rinse q12n4p Maretta Bees, MD   7 mL at 03/05/15 1212  . aspirin EC tablet 81 mg  81 mg Oral Daily Maretta Bees, MD   81 mg at 03/05/15 0957  . ceFEPIme (MAXIPIME) 2 g in dextrose 5 % 50 mL IVPB  2 g Intravenous Q12H Tamera Reason, RPH   2 g at 03/05/15 1006  . chlorhexidine (PERIDEX) 0.12 % solution 15 mL  15 mL Mouth Rinse BID Maretta Bees, MD   15 mL at 03/05/15 0957  . escitalopram (LEXAPRO) tablet 10 mg  10  mg Oral Daily Maretta Bees, MD   10 mg at 03/05/15 0957  . feeding supplement (BOOST / RESOURCE BREEZE) liquid 1 Container  1 Container Oral TID BM Leroy Sea, MD   1 Container at 03/05/15 1400  . guaiFENesin-dextromethorphan (ROBITUSSIN DM) 100-10 MG/5ML syrup 5 mL  5 mL Oral Q4H PRN Maretta Bees, MD      . heparin injection 5,000 Units  5,000 Units Subcutaneous 3 times per day Maretta Bees, MD   5,000 Units at 03/05/15 0600  . lamoTRIgine (LAMICTAL) tablet 25 mg  25 mg Oral Daily Maretta Bees, MD   25 mg at 03/05/15 0957  . levothyroxine (SYNTHROID, LEVOTHROID) injection 37.5 mcg  37.5 mcg Intravenous Daily Maretta Bees, MD   37.5 mcg at 03/05/15 1610  . mometasone-formoterol (DULERA) 100-5 MCG/ACT inhaler 2 puff  2 puff Inhalation BID Maretta Bees, MD   2 puff at 03/05/15 0831  . morphine 2 MG/ML injection 1 mg  1 mg Intravenous Q4H PRN Maretta Bees, MD   1 mg at 03/05/15 1212  . ondansetron (ZOFRAN) tablet 4 mg  4 mg Oral Q6H PRN Shanker Levora Dredge, MD       Or  . ondansetron (ZOFRAN) injection 4 mg  4 mg Intravenous Q6H PRN Maretta Bees, MD      . pantoprazole (PROTONIX) injection 40 mg  40 mg Intravenous Q24H Maretta Bees, MD   40 mg at 03/04/15 2223  . sodium polystyrene (KAYEXALATE) 15 GM/60ML suspension 15 g  15 g Oral Once Leroy Sea, MD   15 g at 03/05/15 9604     Discharge Medications: Please see discharge summary for a list of discharge medications.  Relevant Imaging Results:  Relevant Lab Results:   Additional Information SS#: [id number]  Izora Ribas, [place]

## 2015-03-05 NOTE — Progress Notes (Signed)
  This NP meet briefly with patient at bedside to discuss current medical situation and the importance of a discussion regarding advanced directives and GOC.  Today he does not want to talk and hopes to discharge back to his SNF as soon as possible.  He certainly would benefit from a palliative consult once back to [facility].  Discussed with Dr Thedore Mins.  It seems he may be medically ready for discharge in the next 24-48 hrs.  Will shadow chart for needs.  Lorinda Creed NP

## 2015-03-06 DIAGNOSIS — K5669 Other intestinal obstruction: Secondary | ICD-10-CM

## 2015-03-06 DIAGNOSIS — R627 Adult failure to thrive: Secondary | ICD-10-CM

## 2015-03-06 DIAGNOSIS — K56609 Unspecified intestinal obstruction, unspecified as to partial versus complete obstruction: Secondary | ICD-10-CM | POA: Insufficient documentation

## 2015-03-06 DIAGNOSIS — J42 Unspecified chronic bronchitis: Secondary | ICD-10-CM

## 2015-03-06 DIAGNOSIS — L8990 Pressure ulcer of unspecified site, unspecified stage: Secondary | ICD-10-CM

## 2015-03-06 LAB — BASIC METABOLIC PANEL
ANION GAP: 11 (ref 5–15)
BUN: 7 mg/dL (ref 6–20)
CO2: 29 mmol/L (ref 22–32)
CREATININE: 1.04 mg/dL (ref 0.61–1.24)
Calcium: 9.3 mg/dL (ref 8.9–10.3)
Chloride: 99 mmol/L — ABNORMAL LOW (ref 101–111)
GFR calc Af Amer: >60 mL/min (ref >60)
GFR calc non Af Amer: >60 mL/min (ref >60)
GLUCOSE: 92 mg/dL (ref 65–99)
Potassium: 4.8 mmol/L (ref 3.5–5.1)
Sodium: 139 mmol/L (ref 135–145)

## 2015-03-06 MED ORDER — MUPIROCIN 2 % EX OINT
1.0000 "application " | TOPICAL_OINTMENT | Freq: Two times a day (BID) | CUTANEOUS | Status: DC
  Filled 2015-03-06 (×2): qty 22

## 2015-03-06 MED ORDER — CEFPODOXIME PROXETIL 200 MG PO TABS: 200.0000 mg | ORAL_TABLET | Freq: Two times a day (BID) | ORAL | Status: DC

## 2015-03-06 MED ORDER — ENSURE ENLIVE PO LIQD
237.0000 mL | Freq: Three times a day (TID) | ORAL | Status: DC
  Administered 2015-03-06: 237 mL via ORAL

## 2015-03-06 MED ORDER — CHLORHEXIDINE GLUCONATE CLOTH 2 % EX PADS
6.0000 | MEDICATED_PAD | Freq: Every day | CUTANEOUS | Status: DC
  Administered 2015-03-06: 6 via TOPICAL

## 2015-03-06 NOTE — Progress Notes (Signed)
Patient will DC to: [facility] Anticipated DC date: 03/06/15 Family notified: N/A Transport by: [facility] 2pm  CSW signing off.  Cristobal Goldmann, [place] Clinical Social Worker [phone]

## 2015-03-06 NOTE — Progress Notes (Signed)
Pt iv site removed. Report called [facility] at [facility]. [facility] arrived to unit to transport pt via ambulance. Pt in no distress at this time with no further questions.

## 2015-03-06 NOTE — Discharge Instructions (Signed)
Follow with Primary MD Kirt Boys, DO in 7 days   Get CBC, CMP, 2 view Chest X ray checked  by Primary MD next visit.    Activity: As tolerated with Full fall precautions use walker/cane & assistance as needed   Disposition SNF   Diet:   Heart Healthy with feeding assistance and aspiration precautions.  For Heart failure patients - Check your Weight same time everyday, if you gain over 2 pounds, or you develop in leg swelling, experience more shortness of breath or chest pain, call your Primary MD immediately. Follow Cardiac Low Salt Diet and 1.5 lit/day fluid restriction.   On your next visit with your primary care physician please Get Medicines reviewed and adjusted.   Please request your Prim.MD to go over all Hospital Tests and Procedure/Radiological results at the follow up, please get all Hospital records sent to your Prim MD by signing hospital release before you go home.   If you experience worsening of your admission symptoms, develop shortness of breath, life threatening emergency, suicidal or homicidal thoughts you must seek medical attention immediately by calling 911 or calling your MD immediately  if symptoms less severe.  You Must read complete instructions/literature along with all the possible adverse reactions/side effects for all the Medicines you take and that have been prescribed to you. Take any new Medicines after you have completely understood and accpet all the possible adverse reactions/side effects.   Do not drive, operating heavy machinery, perform activities at heights, swimming or participation in water activities or provide baby sitting services if your were admitted for syncope or siezures until you have seen by Primary MD or a Neurologist and advised to do so again.  Do not drive when taking Pain medications.    Do not take more than prescribed Pain, Sleep and Anxiety Medications  Special Instructions: If you have smoked or chewed Tobacco  in the  last 2 yrs please stop smoking, stop any regular Alcohol  and or any Recreational drug use.  Wear Seat belts while driving.   Please note  You were cared for by a hospitalist during your hospital stay. If you have any questions about your discharge medications or the care you received while you were in the hospital after you are discharged, you can call the unit and asked to speak with the hospitalist on call if the hospitalist that took care of you is not available. Once you are discharged, your primary care physician will handle any further medical issues. Please note that NO REFILLS for any discharge medications will be authorized once you are discharged, as it is imperative that you return to your primary care physician (or establish a relationship with a primary care physician if you do not have one) for your aftercare needs so that they can reassess your need for medications and monitor your lab values.

## 2015-03-06 NOTE — Progress Notes (Signed)
  Subjective: Tolerated clears, Wants food and to go home  Objective: Vital signs in last 24 hours: Temp:  [97.9 F (36.6 C)-98.1 F (36.7 C)] 97.9 F (36.6 C) (02/21 0539) Pulse Rate:  [78-93] 78 (02/21 0539) Resp:  [18-20] 18 (02/21 0539) BP: (122-146)/(56-60) 146/56 mmHg (02/21 0539) SpO2:  [99 %-100 %] 100 % (02/21 0539) Last BM Date: 03/05/15 (pt.has a colostomy LLQ)  Intake/Output from previous day: 02/20 0701 - 02/21 0700 In: -  Out: 4900 [Urine:4900] Intake/Output this shift:    General appearance: cooperative GI: soft, NT, air and liquid stool in bag  Lab Results:   Recent Labs  03/04/15 0553 03/05/15 0708  WBC 10.4 10.5  HGB 8.5* 8.6*  HCT 28.0* 27.7*  PLT 277 328   BMET  Recent Labs  03/04/15 0553 03/05/15 0708  NA 138 135  K 4.5 5.5*  CL 102 99*  CO2 29 27  GLUCOSE 98 79  BUN 7 5*  CREATININE 0.92 0.94  CALCIUM 8.8* 9.3   PT/INR No results for input(s): LABPROT, INR in the last 72 hours. ABG No results for input(s): PHART, HCO3 in the last 72 hours.  Invalid input(s): PCO2, PO2  Anti-infectives: Anti-infectives    Start     Dose/Rate Route Frequency Ordered Stop   03/03/15 1500  ceFEPIme (MAXIPIME) 2 g in dextrose 5 % 50 mL IVPB     2 g 100 mL/hr over 30 Minutes Intravenous Every 12 hours 03/03/15 1428     03/03/15 0600  vancomycin (VANCOCIN) 500 mg in sodium chloride 0.9 % 100 mL IVPB  Status:  Discontinued     500 mg 100 mL/hr over 60 Minutes Intravenous Every 12 hours 03/02/15 1729 03/03/15 1247   03/02/15 1830  vancomycin (VANCOCIN) 1,250 mg in sodium chloride 0.9 % 250 mL IVPB     1,250 mg 166.7 mL/hr over 90 Minutes Intravenous  Once 03/02/15 1729 03/03/15 0032   03/02/15 1500  ceFEPIme (MAXIPIME) 2 g in dextrose 5 % 50 mL IVPB  Status:  Discontinued     2 g 100 mL/hr over 30 Minutes Intravenous Every 24 hours 03/02/15 1402 03/03/15 1428      Assessment/Plan: SBO - improved. Reg diet and rec D/C back to SNF if he  tolerates it.   LOS: 5 days    Khristen Cheyney E 03/06/2015

## 2015-03-06 NOTE — Progress Notes (Signed)
Brief Nutrition Follow-Up Note  Chart reviewed. Pt tolerated clear liquid diet and has now advanced to a regular diet. Per MD notes, will likely d/c to SNF later today.   RD will d/c Boost Breeze and add Ensure Enlive po TID, each supplement provides 350 kcal and 20 grams of protein, due to diet advancement.   Davaris Youtsey A. Mayford Knife, RD, LDN, CDE Pager: [phone] After hours Pager: [phone]

## 2015-03-06 NOTE — Care Management Note (Signed)
Case Management Note  Patient Details  Name: Craig [name] MRN: [id number] Date of Birth: Nov 28, 1945  Subjective/Objective:                 Patient LTC resident at [facility], admitted with SBO.   Action/Plan:  DC today to SNF per CSW.  Expected Discharge Date:                  Expected Discharge Plan:  Skilled Nursing Facility  In-House Referral:  Clinical Social Work  Discharge planning Services     Post Acute Care Choice:    Choice offered to:     DME Arranged:    DME Agency:     HH Arranged:    HH Agency:     Status of Service:  Completed, signed off  Medicare Important Message Given:  Yes Date Medicare IM Given:    Medicare IM give by:    Date Additional Medicare IM Given:    Additional Medicare Important Message give by:     If discussed at Long Length of Stay Meetings, dates discussed:    Additional Comments:  Lawerance Sabal, RN 03/06/2015, 12:32 PM

## 2015-03-06 NOTE — Discharge Summary (Signed)
Craig [name], is a 70 y.o. male  DOB May 08, 1945  MRN [id number].  Admission date:  03/01/2015  Admitting Physician  Maretta Bees, MD  Discharge Date:  03/06/2015   Primary MD  Kirt Boys, DO  Recommendations for primary care physician for things to follow:   Check CBC, BMP in a week.   Admission Diagnosis  SBO (small bowel obstruction) (HCC) [K56.69] AKI (acute kidney injury) (HCC) [N17.9]   Discharge Diagnosis  SBO (small bowel obstruction) (HCC) [K56.69] AKI (acute kidney injury) (HCC) [N17.9]     Principal Problem:   Bowel obstruction (HCC) Active Problems:   Hypothyroidism   Depression   A-fib (HCC)   GERD (gastroesophageal reflux disease)   COPD (chronic obstructive pulmonary disease) (HCC)   Leukocytosis   Protein-calorie malnutrition, severe (HCC)   Anemia of chronic disease   AKI (acute kidney injury) (HCC)   Peripheral vascular disease (HCC)   Failure to thrive in adult   Pressure ulcer   Palliative care encounter   SBO (small bowel obstruction) (HCC)      Past Medical History  Diagnosis Date  . COPD (chronic obstructive pulmonary disease) (HCC)   . Hypertension   . Myocardial infarction (HCC)   . A-fib (HCC)   . GERD (gastroesophageal reflux disease)   . Hepatitis B 04/27/2013  . Dysphagia 04/27/2013  . Diverticulosis 03/05/2013  . Sepsis (HCC) 04/26/2013  . Alcoholic cirrhosis of liver without ascites (HCC)   . Coronary artery disease   . Thyroid disease   . Depression   . Urinary retention     Past Surgical History  Procedure Laterality Date  . Colostomy    . Peg tube placement    . Peg tube removal         HPI  from the history and physical done on the day of admission:   70 year old male-resident of a skilled nursing facility-with past medical history of a  sacral decubiti ulcer (now healed) requiring a diverting colostomy, paroxysmal atrial fibrillation not on anticoagulation, chronic suprapubic catheter in place, failure to thrive syndrome-mostly bed to wheelchair bound presented to the hospital on 12/16 with bowel obstruction likely secondary to adhesions.      Hospital Course:   Bowel obstruction: Liquid stool in colostomy bag-abdomen is less distended. Improved with supportive measures, general surgery was following, he is now much improved clinically, tolerated liquid diet for 24 hours now on regular diet and symptom free, liquid stool in colostomy bag, symptom-free without any nausea or abdominal pain, no distention, will be discharged to SNF, minimize narcotics and benzodiazepines, monitor electrolytes.  AKI (acute kidney injury): Likely prerenal azotemia-from dehydration/poor oral intake. Resolved, continue to cautiously hydrate.   UTI related to chronic indwelling suprapubic catheter: Complicated UTI, suprapubic catheter was changed on 03/03/2015, improved with IV Rocephin, changed on Vantin stop date 03/11/2015.  Coagulase-negative bacteremia 1/2: Suspect a contaminant and not real infection. Discontinued vancomycin.  Paroxysmal A-fib with [place] vasc 2 score of greater than 3: Currently in sinus rhythm, very poor  long-term candidate for anticoagulation. Continue aspirin. Defer long-term enteric regulation to PCP and primary cardiologist.  Hypothyroidism: Continue home dose Synthroid.  GERD (gastroesophageal reflux disease): Continue PPI   Depression: Continue usual medications.  COPD (chronic obstructive pulmonary disease): Continue bronchodilators, lungs are clear-this problem appears currently inactive at this time.   Anemia of chronic disease: Has chronic anemia at baseline-suspect that this is predominantly from chronic disease.Iron panel consistent with combined Chronic disease and Fe def. Follow .  Peripheral vascular  disease: CT of the abdomen incidentally demonstrates extensive atherosclerotic vascular calcifications including high-grade stenosis of the left common iliac artery, moderate stenosis of the left femoral artery-suspect these are chronic issues. He has extensive muscle atrophy in his bilateral lower extremities. He is minimally ambulatory if it best-and mostly bed to wheelchair bound. Furthermore, both lower extremities appear warm, dorsalis pedis pulses present. Given poor overall condition/failure to thrive syndrome with protein calorie malnutrition-suspect best managed medically and likely not a candidate for aggressive care.   Continue aspirin for secondary prevention with outpatient follow-up with PCP, recommend to PCP kindly consider one-time outpatient vascular surgery input.   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  Mild hyperkalemia - stop IVF, Lasix and Kayaxalate, repeat BMP.  Failure to thrive in adult/Protein-calorie malnutrition, severe : nutrition consult when oral intake more stable. PT evaluation. Very poor overall prognoses.  Palliative care: Mostly bedbound-very frail patient with multiple medical comorbidities. Did have a palliative care evaluation last admission-DO NOT RESUSCITATE already in place-this was reconfirmed by this M.D.on admission with patient. Very poor overall prognoses, suspect best served by conservative management. Seen by palliative care this admission, patient wants to continue medical treatment but confirms DO NOT RESUSCITATE.    Discharge Condition: Fair  Follow UP  Follow-up Information    Follow up with Kirt Boys, DO. Schedule an appointment as soon as possible for a visit in 1 week.   Specialty:  Internal Medicine   Contact information:   [address] [place] [address] [phone]        Consults obtained - Gen. surgery  Diet and Activity recommendation: See  Discharge Instructions below  Discharge Instructions       Discharge Instructions    Diet - low sodium heart healthy    Complete by:  As directed      Discharge instructions    Complete by:  As directed   Follow with Primary MD Kirt Boys, DO in 7 days   Get CBC, CMP, 2 view Chest X ray checked  by Primary MD next visit.    Activity: As tolerated with Full fall precautions use walker/cane & assistance as needed   Disposition SNF   Diet:   Heart Healthy with feeding assistance and aspiration precautions.  For Heart failure patients - Check your Weight same time everyday, if you gain over 2 pounds, or you develop in leg swelling, experience more shortness of breath or chest pain, call your Primary MD immediately. Follow Cardiac Low Salt Diet and 1.5 lit/day fluid restriction.   On your next visit with your primary care physician please Get Medicines reviewed and adjusted.   Please request your Prim.MD to go over all Hospital Tests and Procedure/Radiological results at the follow up, please get all Hospital records sent to your Prim MD by signing hospital release before you go home.   If you experience worsening of your admission symptoms, develop shortness of breath, life threatening emergency, suicidal or homicidal  thoughts you must seek medical attention immediately by calling 911 or calling your MD immediately  if symptoms less severe.  You Must read complete instructions/literature along with all the possible adverse reactions/side effects for all the Medicines you take and that have been prescribed to you. Take any new Medicines after you have completely understood and accpet all the possible adverse reactions/side effects.   Do not drive, operating heavy machinery, perform activities at heights, swimming or participation in water activities or provide baby sitting services if your were admitted for syncope or siezures until you have seen by Primary MD or a Neurologist and  advised to do so again.  Do not drive when taking Pain medications.    Do not take more than prescribed Pain, Sleep and Anxiety Medications  Special Instructions: If you have smoked or chewed Tobacco  in the last 2 yrs please stop smoking, stop any regular Alcohol  and or any Recreational drug use.  Wear Seat belts while driving.   Please note  You were cared for by a hospitalist during your hospital stay. If you have any questions about your discharge medications or the care you received while you were in the hospital after you are discharged, you can call the unit and asked to speak with the hospitalist on call if the hospitalist that took care of you is not available. Once you are discharged, your primary care physician will handle any further medical issues. Please note that NO REFILLS for any discharge medications will be authorized once you are discharged, as it is imperative that you return to your primary care physician (or establish a relationship with a primary care physician if you do not have one) for your aftercare needs so that they can reassess your need for medications and monitor your lab values.     Increase activity slowly    Complete by:  As directed              Discharge Medications       Medication List    STOP taking these medications        morphine CONCENTRATE 10 mg / 0.5 ml concentrated solution      TAKE these medications        amiodarone 200 MG tablet  Commonly known as:  PACERONE  Take 200 mg by mouth daily.     aspirin EC 81 MG tablet  Take 81 mg by mouth daily.     BREO ELLIPTA 100-25 MCG/INH Aepb  Generic drug:  fluticasone furoate-vilanterol  Inhale 1 puff into the lungs daily.     cefpodoxime 200 MG tablet  Commonly known as:  VANTIN  Take 1 tablet (200 mg total) by mouth 2 (two) times daily. Stop date 03/11/2015.     cetirizine 10 MG tablet  Commonly known as:  ZYRTEC  Take 10 mg by mouth daily as needed for allergies.      COMPLETE PROTEIN/VITAMIN SHAKE PO  Take 1 Dose by mouth 2 (two) times daily.     DECUBI-VITE PO  Take 2 capsules by mouth daily.     escitalopram 10 MG tablet  Commonly known as:  LEXAPRO  Take 10 mg by mouth daily.     folic acid 1 MG tablet  Commonly known as:  FOLVITE  Take 1 mg by mouth daily.     hydrOXYzine 25 MG tablet  Commonly known as:  ATARAX/VISTARIL  Take 25 mg by mouth every 6 (six) hours as needed for itching.  iron polysaccharides 150 MG capsule  Commonly known as:  NIFEREX  Take 1 capsule (150 mg total) by mouth daily.     LAMICTAL 25 MG tablet  Generic drug:  lamoTRIgine  Take 25 mg by mouth daily. 25 mg po qd x 7 then increase to 50 mg po every day after     levothyroxine 75 MCG tablet  Commonly known as:  SYNTHROID, LEVOTHROID  Take 75 mcg by mouth daily before breakfast.     levothyroxine 50 MCG tablet  Commonly known as:  SYNTHROID, LEVOTHROID  Take 50 mcg by mouth daily.     Melatonin 3 MG Tabs  Take 1 tablet by mouth at bedtime.     omeprazole 20 MG capsule  Commonly known as:  PRILOSEC  Take 20 mg by mouth daily.     PHOSPHA 250 NEUTRAL PO  Take 250 mg by mouth 2 (two) times daily.     polyethylene glycol packet  Commonly known as:  MIRALAX / GLYCOLAX  Take 17 g by mouth daily.     promethazine 25 MG tablet  Commonly known as:  PHENERGAN  Take 25 mg by mouth every 6 (six) hours as needed for nausea or vomiting.     thiamine 100 MG tablet  Take 100 mg by mouth daily.        Major procedures and Radiology Reports - PLEASE review detailed and final reports for all details, in brief -       Ct Abdomen Pelvis W Contrast  03/01/2015  CLINICAL DATA:  70 year old male with 1 week history of left flank pain an 8 prior history of small bowel obstruction. EXAM: CT ABDOMEN AND PELVIS WITH CONTRAST TECHNIQUE: Multidetector CT imaging of the abdomen and pelvis was performed using the standard protocol following bolus administration of  intravenous contrast. CONTRAST:  OMNIPAQUE IOHEXOL 300 MG/ML  SOLN COMPARISON:  Prior CT abdomen/pelvis 10/21/2014 FINDINGS: Lower Chest: Bilateral lower lobe bronchiectasis with partial bronchial impaction, atelectasis and pleural parenchymal scarring. Trace tree-in-bud micro nodularity in the posterior aspect of the right middle lobe. Findings are similar compared to prior imaging. Visualized cardiac structures within normal limits for size. No pericardial effusion. Unremarkable visualized distal thoracic esophagus. Abdomen: Unremarkable CT appearance of the stomach, duodenum, spleen, adrenal glands and pancreas. Normal hepatic contour and morphology. No discrete hepatic lesion. Portal veins are patent. Gallbladder is unremarkable. No intra or extrahepatic biliary ductal dilatation. Unremarkable appearance of the bilateral kidneys. No focal solid lesion, hydronephrosis or nephrolithiasis. 2.2 cm simple cyst in the interpolar right kidney. Multiple loops of abnormal dilated and fluid-filled small bowel throughout the abdomen. There is a parastomal hernia about the left lower quadrant colonic abscess containing a short-segment of the dilated small bowel, however this does not appear to be the source of the obstruction is the bowel is dilated on both sides of this segment. There is a focal transition point to normal caliber bowel in the right hemi abdomen (image 47 series 201) at a region of focal angulation suggesting underlying adhesions. The bowel wall mucosa enhances. No evidence of ischemic complication, perforation or free air. No significant mesenteric edema. No free fluid. Colonic diverticular disease without CT evidence of active inflammation. Prior sigmoid resection with Hartmann's pouch and left lower quadrant diverting ostomy. The Hartmann's pouch appears unremarkable. Colonic diverticular disease without CT evidence of active inflammation. Pelvis: Suprapubic catheter. No evidence of leak or  complication. Unremarkable prostate gland. Musculoskeletal: No acute fracture or aggressive appearing lytic or blastic  osseous lesion. Vascular: Extensive atherosclerotic vascular calcifications. No evidence of aneurysm. 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 Bulky calcified plaque results in at least moderate narrowing of the left common femoral artery. IMPRESSION: 1. Positive for small bowel obstruction with a focal transition points in the right lower quadrant likely secondary to underlying adhesions. No evidence of significant inflammation, bowel ischemic change or perforation. 2. Surgical changes of prior sigmoidectomy with Hartmann's pouch and left lower quadrant diverting colostomy. 3. Parastomal hernia containing omental fat and a short segment of small bowel. No evidence of mechanical obstruction at this location. 4. Bilateral lower lobe bronchiectasis with areas of partial bronchial impaction, atelectasis and pleural parenchymal scarring. 5. Diverticulosis of the residual left colon without evidence of active diverticulitis. 6. 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 Does the patient have clinical symptoms of left lower extremity claudication? Electronically Signed   By: Malachy Moan M.D.   On: 03/01/2015 16:42   Dg Abd 2 Views  03/05/2015  CLINICAL DATA:  Abdominal pain. EXAM: ABDOMEN - 2 VIEW COMPARISON:  03/04/2015.  CT 03/01/2015. FINDINGS: NG tube has been removed. Persistent dilated loops of small bowel noted. Colonic gas pattern is normal. No free air. No acute bony abnormality. IMPRESSION: 1. Interim removal of NG tube. 2. Persistent dilated loops of small bowel are noted. Colonic gas pattern is normal. No free air. Electronically Signed    By: Maisie Fus  Register   On: 03/05/2015 08:21   Dg Abd 2 Views  03/04/2015  CLINICAL DATA:  Followup small bowel obstruction. EXAM: ABDOMEN - 2 VIEW COMPARISON:  03/03/2015. FINDINGS: Nasogastric tube tip in the proximal stomach. The side hole is in the distal esophagus. Multiple dilated small bowel loops with improvement. The largest caliber loop currently has a diameter of 4.0 cm. Gas and stool in normal caliber colon. Mild lumbar spine degenerative changes. No free peritoneal air. IMPRESSION: 1. Nasogastric tube side hole in the distal esophagus. It is recommended that this be advanced. 2. Improving partial small bowel obstruction. Electronically Signed   By: Beckie Salts M.D.   On: 03/04/2015 10:09   Dg Abd 2 Views  03/03/2015  CLINICAL DATA:  70 year old male with small bowel obstruction EXAM: ABDOMEN - 2 VIEW COMPARISON:  Prior abdominal radiograph 03/02/2015 FINDINGS: Diffuse gaseous distention and dilatation of small bowel throughout the mid and upper abdomen. Maximal small bowel diameter is 5.7 cm in the right upper quadrant. A nasogastric tube is in place with the tip overlying the stomach. Overall, the bowel gas pattern is similar to slightly worse compared to yesterday. No evidence of free air. IMPRESSION: 1. Persistent, and perhaps slightly worse small bowel obstruction. Maximal small bowel diameter is 5.7 cm in the right upper quadrant. 2. No evidence of free air on the decubitus views. 3. Gastric tube remains in good position. Electronically Signed   By: Malachy Moan M.D.   On: 03/03/2015 09:45   Dg Abd Acute W/chest  03/02/2015  CLINICAL DATA:  Leukocytosis.  NG tube in place.  Vomiting. EXAM: DG ABDOMEN ACUTE W/ 1V CHEST COMPARISON:  Abdominal radiograph - 05/13/2013; chest radiograph - 04/26/2013; CT abdomen pelvis - 03/01/2015 FINDINGS: Grossly unchanged cardiac silhouette and mediastinal contours with extensive calcified atherosclerotic plaque within a tortuous and potentially mildly  ectatic thoracic aorta. There is  unchanged mild elevation of left hemidiaphragm. Unchanged left basilar heterogeneous opacities favored to represent atelectasis. No pleural effusion or pneumothorax. No evidence of edema. Shrapnel overlies the right mid lung, unchanged. Enteric tube tip projects over the expected location of the distal esophagus. There is moderate to marked gas distention of multiple loops of small bowel with index loop of small bowel with the right upper abdominal quadrant measuring approximately 4.6 cm in diameter. There is a paucity of distal colonic gas. No pneumoperitoneum, pneumatosis or portal venous gas. Excreted contrast from prior IV contrast-enhanced abdominal CT is seen within the urinary bladder. 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 No acute osseus abnormalities IMPRESSION: 1. No acute cardiopulmonary disease. 2. Enteric tube side port projected location distal esophagus. Advancement at least 7 cm is recommended. 3. Findings worrisome for high-grade small bowel obstruction. Continued attention on follow-up is recommended Electronically Signed   By: Simonne Come M.D.   On: 03/02/2015 08:08   Dg Abd Portable 1v  03/02/2015  CLINICAL DATA:  NG tube advanced. EXAM: PORTABLE ABDOMEN - 1 VIEW COMPARISON:  03/02/2015 FINDINGS: NG tube has been advanced with the tip now in the mid stomach. Gaseous distention of small bowel loops again noted, concerning for small bowel obstruction, not significantly changed. IMPRESSION: NG tube has been advanced with the tip now in the mid stomach. Electronically Signed   By: Charlett Nose M.D.   On: 03/02/2015 12:02    Micro Results      Recent Results (from the past 240 hour(s))  Culture, blood (routine x 2)     Status: None (Preliminary result)   Collection Time: 03/02/15 12:35 AM  Result Value Ref Range Status   Specimen Description BLOOD RIGHT ANTECUBITAL  Final   Special Requests IN PEDIATRIC BOTTLE 2CC  Final   Culture NO GROWTH 3  DAYS  Final   Report Status PENDING  Incomplete  Culture, blood (routine x 2)     Status: None   Collection Time: 03/02/15 12:35 AM  Result Value Ref Range Status   Specimen Description BLOOD LEFT ANTECUBITAL  Final   Special Requests BOTTLES DRAWN AEROBIC ONLY 5CC  Final   Culture  Setup Time   Final    GRAM POSITIVE COCCI IN CLUSTERS AEROBIC BOTTLE ONLY CRITICAL RESULT CALLED TO, READ BACK BY AND VERIFIED WITH: D Mccarter,RN AT 1628 03/02/15 BY L BENFIELD    Culture   Final    STAPHYLOCOCCUS SPECIES (COAGULASE NEGATIVE) THE SIGNIFICANCE OF ISOLATING THIS ORGANISM FROM A SINGLE SET OF BLOOD CULTURES WHEN MULTIPLE SETS ARE DRAWN IS UNCERTAIN. PLEASE NOTIFY THE MICROBIOLOGY DEPARTMENT WITHIN ONE WEEK IF SPECIATION AND SENSITIVITIES ARE REQUIRED.    Report Status 03/04/2015 FINAL  Final  MRSA PCR Screening     Status: None   Collection Time: 03/02/15  6:54 AM  Result Value Ref Range Status   MRSA by PCR NEGATIVE NEGATIVE Final    Comment:        The GeneXpert MRSA Assay (FDA approved for NASAL specimens only), is one component of a comprehensive MRSA colonization surveillance program. It is not intended to diagnose MRSA infection nor to guide or monitor treatment for MRSA infections.   Urine culture     Status: None (Preliminary result)   Collection Time: 03/02/15  2:17 PM  Result Value Ref Range Status   Specimen Description URINE, SUPRAPUBIC  Final   Special Requests NONE  Final   Culture   Final    40,000 COLONIES/ml PROVIDENCIA STUARTII  40,000 COLONIES/ml GRAM NEGATIVE RODS 50,000 COLONIES/mL GRAM NEGATIVE RODS    Report Status PENDING  Incomplete   Organism ID, Bacteria PROVIDENCIA STUARTII  Final      Susceptibility   Providencia stuartii - MIC*    AMPICILLIN >=32 RESISTANT Resistant     CEFAZOLIN >=64 RESISTANT Resistant     CEFTRIAXONE <=1 SENSITIVE Sensitive     CIPROFLOXACIN 2 INTERMEDIATE Intermediate     GENTAMICIN 4 RESISTANT Resistant     IMIPENEM 2  SENSITIVE Sensitive     NITROFURANTOIN 128 RESISTANT Resistant     TRIMETH/SULFA >=320 RESISTANT Resistant     AMPICILLIN/SULBACTAM >=32 RESISTANT Resistant     PIP/TAZO <=4 SENSITIVE Sensitive     * 40,000 COLONIES/ml PROVIDENCIA STUARTII  MRSA PCR Screening     Status: Abnormal   Collection Time: 03/05/15  9:20 PM  Result Value Ref Range Status   MRSA by PCR POSITIVE (A) NEGATIVE Corrected    Comment:        The GeneXpert MRSA Assay (FDA approved for NASAL specimens only), is one component of a comprehensive MRSA colonization surveillance program. It is not intended to diagnose MRSA infection nor to guide or monitor treatment for MRSA infections. CORRECTED ON 02/20 AT 2342: PREVIOUSLY REPORTED AS POSITIVE        The GeneXpert MRSA Assay (FDA approved for NASAL specimens only), is one component of a comprehensive MRSA colonization surveillance program. It is not intended to diagnose MRSA infection  nor to guide or monitor treatment for MRSA infections. RESULT CALLED TO, READ BACK BY AND VERIFIED WITH: S ALUMBRES,RN  02/20/1        Today   Subjective    Craig [name] today has no headache,no chest abdominal pain,no new weakness tingling or numbness, feels much better wants to go to SNF today.     Objective   Blood pressure 146/56, pulse 78, temperature 97.9 F (36.6 C), temperature source Oral, resp. rate 18, height 6' 3.6" (1.92 m), weight 69.1 kg (152 lb 5.4 oz), SpO2 100 %.   Intake/Output Summary (Last 24 hours) at 03/06/15 1123 Last data filed at 03/06/15 8295  Gross per 24 hour  Intake      0 ml  Output   4400 ml  Net  -4400 ml    Exam Awake Alert, Oriented x 3, No new F.N deficits, Normal affect [place].AT,PERRAL Supple Neck,No JVD, No cervical lymphadenopathy appriciated.  Symmetrical Chest wall movement, Good air movement bilaterally, CTAB RRR,No Gallops,Rubs or new Murmurs, No Parasternal Heave +ve B.Sounds, Abd Soft, Non tender, No organomegaly  appriciated, No rebound -guarding or rigidity. Colostomy bag with liquid stool No Cyanosis, Clubbing or edema, No new Rash or bruise, appears to have chronically emaciated lower extremities with extreme muscle wasting strength 1 over 5   Data Review   CBC w Diff: Lab Results  Component Value Date   WBC 10.5 03/05/2015   HGB 8.6* 03/05/2015   HCT 27.7* 03/05/2015   PLT 328 03/05/2015   LYMPHOPCT 14 03/01/2015   MONOPCT 10 03/01/2015   EOSPCT 2 03/01/2015   BASOPCT 0 03/01/2015    CMP: Lab Results  Component Value Date   NA 139 03/06/2015   K 4.8 03/06/2015   CL 99* 03/06/2015   CO2 29 03/06/2015   BUN 7 03/06/2015   CREATININE 1.04 03/06/2015   PROT 8.4* 03/01/2015   ALBUMIN 2.8* 03/01/2015   BILITOT 0.5 03/01/2015   ALKPHOS 78 03/01/2015   AST 13* 03/01/2015  ALT 11* 03/01/2015  .   Total Time in preparing paper work, data evaluation and todays exam - 35 minutes  Leroy Sea M.D on 03/06/2015 at 11:23 AM  [facility]   Office  [phone]

## 2015-03-07 LAB — URINE CULTURE

## 2015-03-07 LAB — CULTURE, BLOOD (ROUTINE X 2): Culture: NO GROWTH

## 2015-03-08 ENCOUNTER — Telehealth: Payer: Self-pay | Admitting: Internal Medicine

## 2015-03-08 ENCOUNTER — Non-Acute Institutional Stay (SKILLED_NURSING_FACILITY): Payer: Medicare Other | Admitting: Adult Health

## 2015-03-08 ENCOUNTER — Encounter: Payer: Self-pay | Admitting: Adult Health

## 2015-03-08 DIAGNOSIS — I480 Paroxysmal atrial fibrillation: Secondary | ICD-10-CM | POA: Diagnosis not present

## 2015-03-08 DIAGNOSIS — D638 Anemia in other chronic diseases classified elsewhere: Secondary | ICD-10-CM

## 2015-03-08 DIAGNOSIS — J42 Unspecified chronic bronchitis: Secondary | ICD-10-CM | POA: Diagnosis not present

## 2015-03-08 DIAGNOSIS — K219 Gastro-esophageal reflux disease without esophagitis: Secondary | ICD-10-CM | POA: Diagnosis not present

## 2015-03-08 DIAGNOSIS — K5669 Other intestinal obstruction: Secondary | ICD-10-CM

## 2015-03-08 DIAGNOSIS — R339 Retention of urine, unspecified: Secondary | ICD-10-CM | POA: Diagnosis not present

## 2015-03-08 DIAGNOSIS — I10 Essential (primary) hypertension: Secondary | ICD-10-CM | POA: Diagnosis not present

## 2015-03-08 DIAGNOSIS — N390 Urinary tract infection, site not specified: Secondary | ICD-10-CM | POA: Diagnosis not present

## 2015-03-08 DIAGNOSIS — R634 Abnormal weight loss: Secondary | ICD-10-CM

## 2015-03-08 DIAGNOSIS — E43 Unspecified severe protein-calorie malnutrition: Secondary | ICD-10-CM

## 2015-03-08 DIAGNOSIS — E038 Other specified hypothyroidism: Secondary | ICD-10-CM

## 2015-03-08 DIAGNOSIS — E034 Atrophy of thyroid (acquired): Secondary | ICD-10-CM | POA: Diagnosis not present

## 2015-03-08 DIAGNOSIS — K56600 Partial intestinal obstruction, unspecified as to cause: Secondary | ICD-10-CM

## 2015-03-08 LAB — HM DIABETES FOOT EXAM

## 2015-03-08 NOTE — Progress Notes (Signed)
Patient ID: Craig [name], male   DOB: Sep 16, 1945, 70 y.o.   MRN: [id number]   Facility: [facility]       No Known Allergies  Chief Complaint  Patient presents with  . Hospitalization Follow-up    HPI:  He is a long term resident of this facility who has been hospitalized for SBD which was treated medically. He had acute renal failure. He tells me that he wants his colostomy reversed and want his suprapubic catheter removed. He is not voicing any complaints or concerns. There are no nursing concerns at this time.    Past Medical History  Diagnosis Date  . COPD (chronic obstructive pulmonary disease) ([place])   . Hypertension   . Myocardial infarction ([place])   . A-fib ([place])   . GERD (gastroesophageal reflux disease)   . Hepatitis B 04/27/2013  . Dysphagia 04/27/2013  . Diverticulosis 03/05/2013  . Sepsis ([place]) 04/26/2013  . Alcoholic cirrhosis of liver without ascites ([place])   . Coronary artery disease   . Thyroid disease   . Depression   . Urinary retention     Past Surgical History  Procedure Laterality Date  . Colostomy    . Peg tube placement    . Peg tube removal      VITAL SIGNS BP 126/67 mmHg  Pulse 84  Temp(Src) 97.6 F (36.4 C) (Oral)  Resp 18  Ht 6' 3.6" (1.92 m)  Wt 152 lb 4 oz (69.06 kg)  BMI 18.73 kg/m2  SpO2 95%  Patient's Medications  New Prescriptions   No medications on file  Previous Medications   AMIODARONE (PACERONE) 200 MG TABLET    Take 200 mg by mouth daily.   ASPIRIN EC 81 MG TABLET    Take 81 mg by mouth daily.   CEFPODOXIME (VANTIN) 200 MG TABLET    Take 1 tablet (200 mg total) by mouth 2 (two) times daily. Stop date 03/11/2015.   CETIRIZINE (ZYRTEC) 10 MG TABLET    Take 10 mg by mouth daily as needed for allergies.    ESCITALOPRAM (LEXAPRO) 10 MG TABLET    Take 10 mg by mouth daily.   FLUTICASONE FUROATE-VILANTEROL (BREO ELLIPTA) 100-25 MCG/INH AEPB    Inhale 1 puff into the lungs daily.    FOLIC ACID (FOLVITE) 1 MG TABLET    Take 1  mg by mouth daily.   HYDROXYZINE (ATARAX/VISTARIL) 25 MG TABLET    Take 25 mg by mouth every 6 (six) hours as needed for itching.   IRON POLYSACCHARIDES (NIFEREX) 150 MG CAPSULE    Take 1 capsule (150 mg total) by mouth daily.   K PHOS MONO-SOD PHOS DI & MONO (PHOSPHA 250 NEUTRAL PO)    Take 250 mg by mouth 2 (two) times daily.    LAMOTRIGINE (LAMICTAL) 25 MG TABLET    Take 25 mg by mouth 2 (two) times daily.    LEVOTHYROXINE (SYNTHROID, LEVOTHROID) 125 MCG TABLET    Take 125 mcg by mouth daily before breakfast.   MELATONIN 3 MG TABS    Take 1 tablet by mouth at bedtime.   MULTIPLE VITAMINS-MINERALS (DECUBI-VITE PO)    Take 2 capsules by mouth daily.    NUTRITIONAL SUPPLEMENTS (COMPLETE PROTEIN/VITAMIN SHAKE PO)    Take 1 Dose by mouth 2 (two) times daily.   OMEPRAZOLE (PRILOSEC) 20 MG CAPSULE    Take 20 mg by mouth daily.   POLYETHYLENE GLYCOL (MIRALAX / GLYCOLAX) PACKET    Take 17 g by mouth daily.  PROMETHAZINE (PHENERGAN) 25 MG TABLET    Take 25 mg by mouth every 6 (six) hours as needed for nausea or vomiting.   THIAMINE 100 MG TABLET    Take 100 mg by mouth daily.  Modified Medications   No medications on file  Discontinued Medications     SIGNIFICANT DIAGNOSTIC EXAMS   12-16-13: chest x-ray; right lower lobe atelectasis  08-26-14: s/p foley placement: Placement of 12 French suprapubic bladder drainage catheter. Urine return was cloudy and a sample of urine was sent for urinalysis and culture.  10-20-14: kub: non-specific gas pattern.   10-21-14: ct of abdomen and pelvis: 1. Focal collection of fluid and trace air at the anterior left mid abdominal wall, measuring 8.1 x 2.1 x 5.3 cm, with peripheral enhancement, compatible with an abscess. This appears to be at the prior site of a G-tube placement, as the stomach is tamped up to the abdominal wall underlying the abscess. This may still be contiguous with the stomach. 2. Prominent bronchiectasis at the lower lung lobes, with patchy  bibasilar airspace opacities. This may reflect an atypical infectious process. Aspiration cannot be excluded. Trace right pleural effusion seen. 3. Left lower quadrant colostomy site is unremarkable in appearance. Hartmann's pouch is within normal limits. 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 Mild diverticulosis along the distal transverse, descending and proximal sigmoid colon, without evidence of diverticulitis. 4. Small bilateral renal cysts seen. 5. Diffuse calcification along the abdominal aorta and its branches. 6. 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  02-26-15: kub: mild ileus  03-01-15: KUB: at least a partial small bowel obstruction. Is worse.   03-01-15: ct of abdomen and pelvis: 1. Positive for small bowel obstruction with a focal transition points in the right lower quadrant likely secondary to underlying adhesions. No evidence of significant inflammation, bowel ischemic change or perforation. 2. Surgical changes of prior sigmoidectomy with Hartmann's pouch and left lower quadrant diverting colostomy. 3. Parastomal hernia containing omental fat and a short segment of small bowel. No evidence of mechanical obstruction at this location. 4. Bilateral lower lobe bronchiectasis with areas of partial bronchial impaction, atelectasis and pleural parenchymal scarring. 5. Diverticulosis of the residual left colon without evidence of active diverticulitis. 6. 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  03-05-15: kub: 1. Interim removal of NG tube. 2. Persistent dilated loops of small bowel are noted. Colonic gas pattern is normal. No free air    LABS REVIEWED:    04-26-14: glucose 81; bun 17; creat 1.33;  k+4.7; na++139; phos 4.5; psa 1.24 08-14-14: wbc 8.8; hgb 8.2; hct 28.5; mcv 71.8; plt 250; glucose 72; bun 13; creat 1.13; k+ 4.7; na++140; liver normal albumin 3.2; phos 3.3; mag 1.6; tsh 0.298  08-26-14: wbc 11.0; hgb 7.7; hct 27.1; mcv 74.5. ;plt 297; glucose 124; bun 19; creat 1.21; k+3.6; na++141; liver normal albumin 2.9; urine culture: gram neg rods.  10-02-14: tsh 0.236  10-21-14 :wbc 15.7; hgb 7.6; hct 25.4; mcv 67.9; plt 402; glucose 87; bun 19; creat 1.14; k+ 3.8; na++135; liver normal albumin 2.7; blood culture X2: no growth; HIV; nr 10-22-14: wbc 8.2; hgb 7.0; hct 23.1; mcv 67.7; plt 365; glucose 83; bun 11; creat 1.06; k+ 3.6; n++139; tsh 0.088 10-23-14: urine culture: klebsiella pneumonia  proteus mirabilis: septra 10-25-14; wbc 9.8; hgb 9.8; hct 31.3; mcv 72.8; plt 396; glucose 72; bun 9; creat 0.85; k+ 3.7; na++139; tsh 0.267 12-08-14: tsh 0.195; free T4: 1.62 (normal)  02-26-15: wbc 13,4 hgb 10,6l hct 34.5 mcv 73.2; plt 336; glucose 66; bun 36; creat 1.34; k+ 5.1; na++ 134; liver normal albumin 3.4; amylase 61; lipase 24; U/A +  03-01-15: wbc 16.0; hgb 10.4; hct 33.8; mcv 71.5; ;plt 335; glucose 99; bun 35; creat 1.41; k+ 3.5; na++136; ast 13; alt 11; alk phos 78; albumin 2.8 03-02-15: blood culture: no growth; urine culture: minimal: providencia stuartii; pseudomonas aeruginosa; proteus mirabilis 02-21-15: wbc 12.5; hgb 8.6; hct 29.5; mcv 73.9; plt 276; glucose 98; bun 7; creat 0.92; creat 4.5;na++138; vit B12: 1457; folate 47.4; iron 7; tibc 235; mag 1.9 pre-albumin 68  03-06-15: glucose 92; bun 7; creat 1.04; k+ 4.8; na++139       Review of Systems Constitutional: Negative for appetite change and fatigue.  HENT: Negative for congestion.   Respiratory: Negative for cough, chest tightness and shortness of breath.   Cardiovascular: Negative for chest pain, palpitations and leg swelling.  Gastrointestinal:no nausea vomiting or pain  Musculoskeletal: Negative for myalgias and  arthralgias.  Skin: Negative for pallor. Psychiatric/Behavioral: The patient is not nervous/anxious.      Physical Exam Constitutional: frail. No distress.  Eyes: Conjunctivae are normal.  Neck: Neck supple. No JVD present. No thyromegaly present.  Cardiovascular: Normal rate, regular rhythm and intact distal pulses.   Respiratory: Effort normal and breath sounds normal. No respiratory distress. He has no wheezes.  GI: abdomen soft nontender; no distension; bowel sounds present in all four quads  Has left lower quad colostomy  has gas and liquid stool in bag Has s/p foley  Musculoskeletal: He exhibits no edema.  Able to move upper extremities Does not move lower extremities    Lymphadenopathy:    He has no cervical adenopathy.  Neurological: He is alert.  Skin: Skin is warm and dry. He is not diaphoretic.  Psychiatric: He has a normal mood and affect      ASSESSMENT/ PLAN:   1. Hypothyroidism: will continue synthroid 125 mcg daily;  tsh 0.195; free T4: 1.62   2. Malnutrition:  Current weight is 152 pounds will continue to monitor his weight in Sept 2016 was 179 pounds.  He has been hospitalized for a small bowel obstruction; will continue supplements per facility protocol   3. Afib: his heart rate is stable; will continue amiodarone 200 mg daily for rate control; will continue asa 81 mg daily will monitor his status.   4. Jerrye Bushy: will continue prilosec 20 mg daily  will monitor  5.  COPD: he is stable is 02 dependent; will continue breo ellipta 100/25 daily   6. Depression: is followed by psych services will continue lexapro at 10 mg daily    Will continue lamictal 25 mg twice daily to help stabilize mood   7. 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 Chronic pain:  Currently denies pain is not on medications; will monitor   9. Urine retention: is status post suprapubic foley  10. Anemia:  will continue niferex 150 mg daily   11. UTI: will complete vantin and will monitor his status.    Time spent with patient 50   minutes >50% time spent counseling; reviewing medical record; tests; labs; and developing future  plan of care      Ok Edwards NP [facility]  Contact [phone] Monday through Friday 8am- 5pm  After hours call [phone]

## 2015-03-08 NOTE — Telephone Encounter (Signed)
Possible re-admission to facility  - This is a patient you were seeing at **[facility]** - [facility] fu is needed IF patient was re-admitted to facility upon discharge. Hospital discharge **03/06/15**

## 2015-03-13 ENCOUNTER — Non-Acute Institutional Stay (SKILLED_NURSING_FACILITY): Payer: Medicare Other | Admitting: Internal Medicine

## 2015-03-13 ENCOUNTER — Encounter: Payer: Self-pay | Admitting: Internal Medicine

## 2015-03-13 DIAGNOSIS — Z933 Colostomy status: Secondary | ICD-10-CM | POA: Diagnosis not present

## 2015-03-13 DIAGNOSIS — E43 Unspecified severe protein-calorie malnutrition: Secondary | ICD-10-CM

## 2015-03-13 DIAGNOSIS — G8929 Other chronic pain: Secondary | ICD-10-CM

## 2015-03-13 DIAGNOSIS — R339 Retention of urine, unspecified: Secondary | ICD-10-CM | POA: Diagnosis not present

## 2015-03-13 DIAGNOSIS — I480 Paroxysmal atrial fibrillation: Secondary | ICD-10-CM | POA: Diagnosis not present

## 2015-03-13 DIAGNOSIS — F329 Major depressive disorder, single episode, unspecified: Secondary | ICD-10-CM | POA: Diagnosis not present

## 2015-03-13 DIAGNOSIS — K219 Gastro-esophageal reflux disease without esophagitis: Secondary | ICD-10-CM | POA: Diagnosis not present

## 2015-03-13 DIAGNOSIS — J42 Unspecified chronic bronchitis: Secondary | ICD-10-CM

## 2015-03-13 DIAGNOSIS — E034 Atrophy of thyroid (acquired): Secondary | ICD-10-CM

## 2015-03-13 DIAGNOSIS — E038 Other specified hypothyroidism: Secondary | ICD-10-CM | POA: Diagnosis not present

## 2015-03-13 DIAGNOSIS — Z960 Presence of urogenital implants: Secondary | ICD-10-CM

## 2015-03-13 DIAGNOSIS — I10 Essential (primary) hypertension: Secondary | ICD-10-CM | POA: Diagnosis not present

## 2015-03-13 DIAGNOSIS — Z9289 Personal history of other medical treatment: Secondary | ICD-10-CM

## 2015-03-13 DIAGNOSIS — S90424A Blister (nonthermal), right lesser toe(s), initial encounter: Secondary | ICD-10-CM | POA: Diagnosis not present

## 2015-03-13 DIAGNOSIS — F32A Depression, unspecified: Secondary | ICD-10-CM

## 2015-03-13 DIAGNOSIS — Z978 Presence of other specified devices: Secondary | ICD-10-CM

## 2015-03-13 NOTE — Progress Notes (Signed)
Patient ID: Craig [name], male   DOB: August 11, 1945, 70 y.o.   MRN: [id number]    HISTORY AND PHYSICAL   DATE:   Location:  [facility] of Service: SNF (31)   Extended Emergency Contact Information Primary Emergency Contact: Coleman,Gearold  [place] Phone: [phone] Relation: Friend  Advanced Directive information Does patient have an advance directive?: Yes, Type of Advance Directive: Out of facility DNR (pink MOST or yellow form), Does patient want to make changes to advanced directive?: No - Patient declined  Chief Complaint  Patient presents with  . Readmit To SNF    HPI:  70 yo male long term resident seen today as a new admission into SNF following hospital stay for small bowel obstruction, UTI, AKI. SBO tx with supportive care. Surgery did follow in hospital. Renal fxn improved with IVF. complicated UTI tx with rocephin IV --> po vantin (stop date 2/26th). 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 He is not a candidate for aggressive measures. Palliative care consulted. Wound care followed left heel chronic unstageable pressure injury  He reports no concerns today. No f/c, N/V. Appetite ok,. Sleeping well. He has a sore on right toe. No d/c. No nursing issues. No falls. He has completed abx  Hypothyroidism - stable on synthroid 125 mcg daily. TSH  0.195 with free T4 1.62   Malnutrition - Current weight is 152 pounds (Sept 2016 weight 179 pounds). he gets nutritional supplements per facility protocol. He bedbound  Afib - rate controlled on amiodarone 200 mg daily. Takes ASA  GERD - stable on prilosec  COPD- stable. He is O2 dependent. Takes breo ellipta 100/25 daily   Depression - mood stable. followed by psych services. Takes lexapro and  lamictal   Constipation - stable on miralax 17 gm daily . 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 COPD (chronic obstructive pulmonary disease) ([place])   . Hypertension   . Myocardial infarction ([place])   . A-fib ([place])   . GERD (gastroesophageal reflux disease)   . Hepatitis B 04/27/2013  . Dysphagia 04/27/2013  . Diverticulosis 03/05/2013  . Sepsis ([place]) 04/26/2013  . Alcoholic cirrhosis of liver without ascites ([place])   . Coronary artery disease   . Thyroid disease   . Depression   . Urinary retention     Past Surgical History  Procedure Laterality Date  . Colostomy    . Peg tube placement    . Peg tube removal      Patient Care Team: Gildardo Cranker, DO as PCP - General (Internal Medicine) Gerlene Fee, NP as Nurse Practitioner ([place]) [place] ([place])  Social History   Social History  . Marital Status: Unknown    Spouse Name: N/A  . Number of Children: N/A  . Years of Education: N/A   Occupational History  . Not on file.   Social History Main Topics  . Smoking status: Former Research scientist (life sciences)  . Smokeless tobacco: Not on file  . Alcohol Use: No  . Drug Use: No  . Sexual Activity: Not Currently   Other Topics Concern  . Not on file  Social History Narrative     reports that he has quit smoking. He does not have any smokeless tobacco history on file. He reports that he does not drink alcohol or use illicit drugs.  History reviewed. No pertinent family history. No family status information on file.    Immunization History  Administered Date(s) Administered  . PPD Test 03/06/2015    No Known Allergies  Medications: Patient's Medications  New Prescriptions   No medications on file  Previous Medications   AMIODARONE (PACERONE) 200 MG TABLET    Take 200 mg by mouth daily.   ASPIRIN EC 81 MG TABLET    Take 81 mg by mouth daily.    CETIRIZINE (ZYRTEC) 10 MG TABLET    Take 10 mg by mouth daily as needed for allergies.    ESCITALOPRAM (LEXAPRO) 10 MG TABLET    Take 10 mg by mouth daily.   FLUTICASONE FUROATE-VILANTEROL (BREO ELLIPTA) 100-25 MCG/INH AEPB    Inhale 1 puff into the lungs daily.    FOLIC ACID (FOLVITE) 1 MG TABLET    Take 1 mg by mouth daily.   HYDROXYZINE (ATARAX/VISTARIL) 25 MG TABLET    Take 25 mg by mouth every 6 (six) hours as needed for itching.   IRON POLYSACCHARIDES (NIFEREX) 150 MG CAPSULE    Take 1 capsule (150 mg total) by mouth daily.   K PHOS MONO-SOD PHOS DI & MONO (PHOSPHA 250 NEUTRAL PO)    Take 250 mg by mouth 2 (two) times daily.    LAMOTRIGINE (LAMICTAL) 25 MG TABLET    Take 25 mg  by mouth daily. 25 mg po qd x7 then increase to 50 mg po every day after   LEVOTHYROXINE (SYNTHROID, LEVOTHROID) 50 MCG TABLET    Take 50 mcg by mouth daily.   LEVOTHYROXINE (SYNTHROID, LEVOTHROID) 75 MCG TABLET    Take 75 mcg by mouth daily before breakfast.   MORPHINE SULFATE (MORPHINE CONCENTRATE) 10 MG / 0.5 ML CONCENTRATED SOLUTION    Take 5 mg by mouth every 4 (four) hours as needed for severe pain.   MULTIPLE VITAMINS-MINERALS (DECUBI-VITE PO)    Take 2 capsules by mouth daily.    NUTRITIONAL SUPPLEMENTS (COMPLETE PROTEIN/VITAMIN SHAKE PO)    Take 1 Dose by mouth 2 (two) times daily.   OMEPRAZOLE (PRILOSEC) 20 MG CAPSULE    Take 20 mg by mouth daily.   PROMETHAZINE (PHENERGAN) 25 MG TABLET    Take 25 mg by mouth every 6 (six) hours as needed for nausea or vomiting.   THIAMINE 100 MG TABLET    Take 100 mg by mouth daily.  Modified Medications   No medications on file  Discontinued Medications   CEFPODOXIME (VANTIN) 200 MG TABLET    Take 1 tablet (200 mg total) by mouth 2 (two) times daily. Stop date 03/11/2015.   LAMOTRIGINE (LAMICTAL) 25 MG TABLET       LEVOTHYROXINE (SYNTHROID, LEVOTHROID) 125 MCG TABLET    Take 125 mcg by mouth daily before breakfast.   MELATONIN 3 MG TABS    Take 1 tablet by mouth at  bedtime.   POLYETHYLENE GLYCOL (MIRALAX / GLYCOLAX) PACKET    Take 17 g by mouth daily.    Review of Systems  Unable to perform ROS: Psychiatric disorder    Filed Vitals:   03/13/15 1510  BP: 132/70  Pulse: 78  Temp: 97.8 F (36.6 C)  TempSrc: Oral  Resp: 18  Height: 5' 10"  (1.778 m)  Weight: 152 lb  6.4 oz (69.128 kg)  SpO2: 95%   Body mass index is 21.87 kg/(m^2).  Physical Exam  Constitutional: He appears well-developed.  Frail appearing in NAD, lying in bed. [place] O2 intact  HENT:  Mouth/Throat: Oropharynx is clear and moist.  MMM  Eyes: Pupils are equal, round, and reactive to light. No scleral icterus.  Neck: Neck supple. Carotid bruit is not present. No thyromegaly present.  Cardiovascular: Normal rate, regular rhythm and intact distal pulses.  Exam reveals no gallop and no friction rub.   Murmur (1/6 SEM) heard. no distal LE swelling. No calf TTP  Pulmonary/Chest: Effort normal and breath sounds normal. He has no wheezes. He has no rales. He exhibits no tenderness.  Abdominal: Soft. Bowel sounds are normal. He exhibits no distension, no abdominal bruit, no pulsatile midline mass and no mass. There is no tenderness. There is no rebound and no guarding.  Colostomy intact  Genitourinary:  Foley cath DTG clear yellow urine  Musculoskeletal: He exhibits edema and tenderness.  B/l calf atrophy. Small and large deformities.  Lymphadenopathy:    He has no cervical adenopathy.  Neurological: He is alert.  Skin: Skin is warm and dry. No rash noted.  Right 2nd toe blister vs ulceration, no purulent d/c.   Psychiatric: He has a normal mood and affect. His behavior is normal.     Labs reviewed: Admission on 03/01/2015, Discharged on 03/06/2015  Component Date Value Ref Range Status  . Sodium 03/01/2015 136  135 - 145 mmol/L Final  . Potassium 03/01/2015 3.5  3.5 - 5.1 mmol/L Final  . Chloride 03/01/2015 95* 101 - 111 mmol/L Final  . CO2 03/01/2015 30  22 - 32 mmol/L  Final  . Glucose, Bld 03/01/2015 99  65 - 99 mg/dL Final  . BUN 03/01/2015 35* 6 - 20 mg/dL Final  . Creatinine, Ser 03/01/2015 1.41* 0.61 - 1.24 mg/dL Final  . Calcium 03/01/2015 9.0  8.9 - 10.3 mg/dL Final  . Total Protein 03/01/2015 8.4* 6.5 - 8.1 g/dL Final  . Albumin 03/01/2015 2.8* 3.5 - 5.0 g/dL Final  . AST 03/01/2015 13* 15 - 41 U/L Final  . ALT 03/01/2015 11* 17 - 63 U/L Final  . Alkaline Phosphatase 03/01/2015 78  38 - 126 U/L Final  . Total Bilirubin 03/01/2015 0.5  0.3 - 1.2 mg/dL Final  . GFR calc non Af Amer 03/01/2015 50* >60 mL/min Final  . GFR calc Af Amer 03/01/2015 58* >60 mL/min Final   Comment: (NOTE) The eGFR has been calculated using the CKD EPI equation. This calculation has not been validated in all clinical situations. eGFR's persistently <60 mL/min signify possible Chronic Kidney Disease.   . Anion gap 03/01/2015 11  5 - 15 Final  . Lipase 03/01/2015 32  11 - 51 U/L Final  . WBC 03/01/2015 16.0* 4.0 - 10.5 K/uL Final  . RBC 03/01/2015 4.73  4.22 - 5.81 MIL/uL Final  . Hemoglobin 03/01/2015 10.4* 13.0 - 17.0 g/dL Final  . HCT 03/01/2015 33.8* 39.0 - 52.0 % Final  . MCV 03/01/2015 71.5* 78.0 - 100.0 fL Final  . MCH 03/01/2015 22.0* 26.0 - 34.0 pg Final  . MCHC 03/01/2015 30.8  30.0 - 36.0 g/dL Final  . RDW 03/01/2015 17.4* 11.5 - 15.5 % Final  . Platelets 03/01/2015 335  150 - 400 K/uL Final  . Neutrophils Relative % 03/01/2015 74   Final  . Lymphocytes Relative 03/01/2015 14   Final  . Monocytes Relative 03/01/2015 10  Final  . Eosinophils Relative 03/01/2015 2   Final  . Basophils Relative 03/01/2015 0   Final  . Neutro Abs 03/01/2015 11.9* 1.7 - 7.7 K/uL Final  . Lymphs Abs 03/01/2015 2.2  0.7 - 4.0 K/uL Final  . Monocytes Absolute 03/01/2015 1.6* 0.1 - 1.0 K/uL Final  . Eosinophils Absolute 03/01/2015 0.3  0.0 - 0.7 K/uL Final  . Basophils Absolute 03/01/2015 0.0  0.0 - 0.1 K/uL Final  . RBC Morphology 03/01/2015 POLYCHROMASIA PRESENT   Final  .  Smear Review 03/01/2015 LARGE PLATELETS PRESENT   Final  . Sodium 03/02/2015 139  135 - 145 mmol/L Final  . Potassium 03/02/2015 3.5  3.5 - 5.1 mmol/L Final  . Chloride 03/02/2015 101  101 - 111 mmol/L Final  . CO2 03/02/2015 30  22 - 32 mmol/L Final  . Glucose, Bld 03/02/2015 78  65 - 99 mg/dL Final  . BUN 03/02/2015 33* 6 - 20 mg/dL Final  . Creatinine, Ser 03/02/2015 1.45* 0.61 - 1.24 mg/dL Final  . Calcium 03/02/2015 8.6* 8.9 - 10.3 mg/dL Final  . GFR calc non Af Amer 03/02/2015 48* >60 mL/min Final  . GFR calc Af Amer 03/02/2015 56* >60 mL/min Final   Comment: (NOTE) The eGFR has been calculated using the CKD EPI equation. This calculation has not been validated in all clinical situations. eGFR's persistently <60 mL/min signify possible Chronic Kidney Disease.   . Anion gap 03/02/2015 8  5 - 15 Final  . WBC 03/02/2015 20.0* 4.0 - 10.5 K/uL Final  . RBC 03/02/2015 4.12* 4.22 - 5.81 MIL/uL Final  . Hemoglobin 03/02/2015 9.4* 13.0 - 17.0 g/dL Final  . HCT 03/02/2015 30.2* 39.0 - 52.0 % Final  . MCV 03/02/2015 73.3* 78.0 - 100.0 fL Final  . MCH 03/02/2015 22.8* 26.0 - 34.0 pg Final  . MCHC 03/02/2015 31.1  30.0 - 36.0 g/dL Final  . RDW 03/02/2015 18.1* 11.5 - 15.5 % Final  . Platelets 03/02/2015 310  150 - 400 K/uL Final  . Specimen Description 03/02/2015 BLOOD RIGHT ANTECUBITAL   Final  . Special Requests 03/02/2015 IN PEDIATRIC BOTTLE 2CC   Final  . Culture 03/02/2015 NO GROWTH 5 DAYS   Final  . Report Status 03/02/2015 03/07/2015 FINAL   Final  . Specimen Description 03/02/2015 BLOOD LEFT ANTECUBITAL   Final  . Special Requests 03/02/2015 BOTTLES DRAWN AEROBIC ONLY 5CC   Final  . Culture  Setup Time 03/02/2015    Final                   Value:GRAM POSITIVE COCCI IN CLUSTERS AEROBIC BOTTLE ONLY CRITICAL RESULT CALLED TO, READ BACK BY AND VERIFIED WITH: D Bossi,RN AT 1628 03/02/15 BY L BENFIELD   . Culture 03/02/2015    Final                   Value:STAPHYLOCOCCUS SPECIES  (COAGULASE NEGATIVE) THE SIGNIFICANCE OF ISOLATING THIS ORGANISM FROM A SINGLE SET OF BLOOD CULTURES WHEN MULTIPLE SETS ARE DRAWN IS UNCERTAIN. PLEASE NOTIFY THE MICROBIOLOGY DEPARTMENT WITHIN ONE WEEK IF SPECIATION AND SENSITIVITIES ARE REQUIRED.   Marland Kitchen Report Status 03/02/2015 03/04/2015 FINAL   Final  . MRSA by PCR 03/02/2015 NEGATIVE  NEGATIVE Final   Comment:        The GeneXpert MRSA Assay (FDA approved for NASAL specimens only), is one component of a comprehensive MRSA colonization surveillance program. It is not intended to diagnose MRSA infection nor to guide or monitor treatment  for MRSA infections.   . Color, Urine 03/02/2015 YELLOW  YELLOW Final  . APPearance 03/02/2015 TURBID* CLEAR Final  . Specific Gravity, Urine 03/02/2015 1.027  1.005 - 1.030 Final  . pH 03/02/2015 5.0  5.0 - 8.0 Final  . Glucose, UA 03/02/2015 NEGATIVE  NEGATIVE mg/dL Final  . Hgb urine dipstick 03/02/2015 MODERATE* NEGATIVE Final  . Bilirubin Urine 03/02/2015 NEGATIVE  NEGATIVE Final  . Ketones, ur 03/02/2015 15* NEGATIVE mg/dL Final  . Protein, ur 03/02/2015 30* NEGATIVE mg/dL Final  . Nitrite 03/02/2015 POSITIVE* NEGATIVE Final  . Leukocytes, UA 03/02/2015 MODERATE* NEGATIVE Final  . Squamous Epithelial / LPF 03/02/2015 6-30* NONE SEEN Final  . WBC, UA 03/02/2015 TOO NUMEROUS TO COUNT  0 - 5 WBC/hpf Final  . RBC / HPF 03/02/2015 0-5  0 - 5 RBC/hpf Final  . Bacteria, UA 03/02/2015 MANY* NONE SEEN Final  . Urine-Other 03/02/2015 AMORPHOUS URATES/PHOSPHATES   Final   LESS THAN 10 mL OF URINE SUBMITTED  . Specimen Description 03/02/2015 URINE, SUPRAPUBIC   Final  . Special Requests 03/02/2015 NONE   Final  . Culture 03/02/2015    Final                   Value:40,000 COLONIES/ml PROVIDENCIA STUARTII 40,000 COLONIES/ml PSEUDOMONAS AERUGINOSA 50,000 COLONIES/mL PROTEUS MIRABILIS   . Report Status 03/02/2015 03/07/2015 FINAL   Final  . Organism ID, Bacteria 03/02/2015 PROVIDENCIA STUARTII   Final    . Organism ID, Bacteria 03/02/2015 PROTEUS MIRABILIS   Final  . Organism ID, Bacteria 03/02/2015 PSEUDOMONAS AERUGINOSA   Final  . WBC 03/03/2015 12.5* 4.0 - 10.5 K/uL Final  . RBC 03/03/2015 3.99* 4.22 - 5.81 MIL/uL Final  . Hemoglobin 03/03/2015 8.6* 13.0 - 17.0 g/dL Final  . HCT 03/03/2015 29.5* 39.0 - 52.0 % Final  . MCV 03/03/2015 73.9* 78.0 - 100.0 fL Final  . MCH 03/03/2015 21.6* 26.0 - 34.0 pg Final  . MCHC 03/03/2015 29.2* 30.0 - 36.0 g/dL Final  . RDW 03/03/2015 17.9* 11.5 - 15.5 % Final  . Platelets 03/03/2015 276  150 - 400 K/uL Final  . Sodium 03/03/2015 140  135 - 145 mmol/L Final  . Potassium 03/03/2015 4.1  3.5 - 5.1 mmol/L Final  . Chloride 03/03/2015 105  101 - 111 mmol/L Final  . CO2 03/03/2015 28  22 - 32 mmol/L Final  . Glucose, Bld 03/03/2015 80  65 - 99 mg/dL Final  . BUN 03/03/2015 18  6 - 20 mg/dL Final  . Creatinine, Ser 03/03/2015 1.09  0.61 - 1.24 mg/dL Final  . Calcium 03/03/2015 8.7* 8.9 - 10.3 mg/dL Final  . GFR calc non Af Amer 03/03/2015 >60  >60 mL/min Final  . GFR calc Af Amer 03/03/2015 >60  >60 mL/min Final   Comment: (NOTE) The eGFR has been calculated using the CKD EPI equation. This calculation has not been validated in all clinical situations. eGFR's persistently <60 mL/min signify possible Chronic Kidney Disease.   . Anion gap 03/03/2015 7  5 - 15 Final  . Magnesium 03/03/2015 1.9  1.7 - 2.4 mg/dL Final  . Prealbumin 03/03/2015 6.8* 18 - 38 mg/dL Final  . Vitamin B-12 03/03/2015 1457* 180 - 914 pg/mL Final   Comment: (NOTE) This assay is not validated for testing neonatal or myeloproliferative syndrome specimens for Vitamin B12 levels.   . Folate 03/03/2015 47.4  >5.9 ng/mL Final   RESULTS CONFIRMED BY MANUAL DILUTION  . Iron 03/03/2015 7* 45 - 182 ug/dL  Final  . TIBC 03/03/2015 235* 250 - 450 ug/dL Final  . Saturation Ratios 03/03/2015 3* 17.9 - 39.5 % Final  . UIBC 03/03/2015 228   Final  . Ferritin 03/03/2015 45  24 - 336 ng/mL  Final  . Retic Ct Pct 03/03/2015 1.4  0.4 - 3.1 % Final  . RBC. 03/03/2015 3.99* 4.22 - 5.81 MIL/uL Final  . Retic Count, Manual 03/03/2015 55.9  19.0 - 186.0 K/uL Final  . WBC 03/04/2015 10.4  4.0 - 10.5 K/uL Final  . RBC 03/04/2015 3.80* 4.22 - 5.81 MIL/uL Final  . Hemoglobin 03/04/2015 8.5* 13.0 - 17.0 g/dL Final  . HCT 03/04/2015 28.0* 39.0 - 52.0 % Final  . MCV 03/04/2015 73.7* 78.0 - 100.0 fL Final  . MCH 03/04/2015 22.4* 26.0 - 34.0 pg Final  . MCHC 03/04/2015 30.4  30.0 - 36.0 g/dL Final  . RDW 03/04/2015 17.9* 11.5 - 15.5 % Final  . Platelets 03/04/2015 277  150 - 400 K/uL Final  . Sodium 03/04/2015 138  135 - 145 mmol/L Final  . Potassium 03/04/2015 4.5  3.5 - 5.1 mmol/L Final  . Chloride 03/04/2015 102  101 - 111 mmol/L Final  . CO2 03/04/2015 29  22 - 32 mmol/L Final  . Glucose, Bld 03/04/2015 98  65 - 99 mg/dL Final  . BUN 03/04/2015 7  6 - 20 mg/dL Final  . Creatinine, Ser 03/04/2015 0.92  0.61 - 1.24 mg/dL Final  . Calcium 03/04/2015 8.8* 8.9 - 10.3 mg/dL Final  . GFR calc non Af Amer 03/04/2015 >60  >60 mL/min Final  . GFR calc Af Amer 03/04/2015 >60  >60 mL/min Final   Comment: (NOTE) The eGFR has been calculated using the CKD EPI equation. This calculation has not been validated in all clinical situations. eGFR's persistently <60 mL/min signify possible Chronic Kidney Disease.   . Anion gap 03/04/2015 7  5 - 15 Final  . WBC 03/05/2015 10.5  4.0 - 10.5 K/uL Final  . RBC 03/05/2015 3.84* 4.22 - 5.81 MIL/uL Final  . Hemoglobin 03/05/2015 8.6* 13.0 - 17.0 g/dL Final  . HCT 03/05/2015 27.7* 39.0 - 52.0 % Final  . MCV 03/05/2015 72.1* 78.0 - 100.0 fL Final  . MCH 03/05/2015 22.4* 26.0 - 34.0 pg Final  . MCHC 03/05/2015 31.0  30.0 - 36.0 g/dL Final  . RDW 03/05/2015 17.9* 11.5 - 15.5 % Final  . Platelets 03/05/2015 328  150 - 400 K/uL Final  . Sodium 03/05/2015 135  135 - 145 mmol/L Final  . Potassium 03/05/2015 5.5* 3.5 - 5.1 mmol/L Final  . Chloride 03/05/2015  99* 101 - 111 mmol/L Final  . CO2 03/05/2015 27  22 - 32 mmol/L Final  . Glucose, Bld 03/05/2015 79  65 - 99 mg/dL Final  . BUN 03/05/2015 5* 6 - 20 mg/dL Final  . Creatinine, Ser 03/05/2015 0.94  0.61 - 1.24 mg/dL Final  . Calcium 03/05/2015 9.3  8.9 - 10.3 mg/dL Final  . GFR calc non Af Amer 03/05/2015 >60  >60 mL/min Final  . GFR calc Af Amer 03/05/2015 >60  >60 mL/min Final   Comment: (NOTE) The eGFR has been calculated using the CKD EPI equation. This calculation has not been validated in all clinical situations. eGFR's persistently <60 mL/min signify possible Chronic Kidney Disease.   . Anion gap 03/05/2015 9  5 - 15 Final  . MRSA by PCR 03/05/2015 POSITIVE* NEGATIVE Corrected   Comment:  The GeneXpert MRSA Assay (FDA approved for NASAL specimens only), is one component of a comprehensive MRSA colonization surveillance program. It is not intended to diagnose MRSA infection nor to guide or monitor treatment for MRSA infections. CORRECTED ON 02/20 AT 2342: PREVIOUSLY REPORTED AS POSITIVE        The GeneXpert MRSA Assay (FDA approved for NASAL specimens only), is one component of a comprehensive MRSA colonization surveillance program. It is not intended to diagnose MRSA infection  nor to guide or monitor treatment for MRSA infections. RESULT CALLED TO, READ BACK BY AND VERIFIED WITH: S [place],RN @2337  02/20/1   . Sodium 03/06/2015 139  135 - 145 mmol/L Final  . Potassium 03/06/2015 4.8  3.5 - 5.1 mmol/L Final  . Chloride 03/06/2015 99* 101 - 111 mmol/L Final  . CO2 03/06/2015 29  22 - 32 mmol/L Final  . Glucose, Bld 03/06/2015 92  65 - 99 mg/dL Final  . BUN 03/06/2015 7  6 - 20 mg/dL Final  . Creatinine, Ser 03/06/2015 1.04  0.61 - 1.24 mg/dL Final  . Calcium 03/06/2015 9.3  8.9 - 10.3 mg/dL Final  . GFR calc non Af Amer 03/06/2015 >60  >60 mL/min Final  . GFR calc Af Amer 03/06/2015 >60  >60 mL/min Final   Comment: (NOTE) The eGFR has been calculated using the CKD  EPI equation. This calculation has not been validated in all clinical situations. eGFR's persistently <60 mL/min signify possible Chronic Kidney Disease.   . Anion gap 03/06/2015 11  5 - 15 Final  Nursing Home on 02/26/2015  Component Date Value Ref Range Status  . TSH 12/08/2014 0.20* 0.41 - 5.90 uIU/mL Final    Ct Abdomen Pelvis W Contrast  03/01/2015  CLINICAL DATA:  70 year old male with 1 week history of left flank pain an 8 prior history of small bowel obstruction. EXAM: CT ABDOMEN AND PELVIS WITH CONTRAST TECHNIQUE: Multidetector CT imaging of the abdomen and pelvis was performed using the standard protocol following bolus administration of intravenous contrast. CONTRAST:  157m OMNIPAQUE IOHEXOL 300 MG/ML  SOLN COMPARISON:  Prior CT abdomen/pelvis 10/21/2014 FINDINGS: Lower Chest: Bilateral lower lobe bronchiectasis with partial bronchial impaction, atelectasis and pleural parenchymal scarring. Trace tree-in-bud micro nodularity in the posterior aspect of the right middle lobe. Findings are similar compared to prior imaging. Visualized cardiac structures within normal limits for size. No pericardial effusion. Unremarkable visualized distal thoracic esophagus. Abdomen: Unremarkable CT appearance of the stomach, duodenum, spleen, adrenal glands and pancreas. Normal hepatic contour and morphology. No discrete hepatic lesion. Portal veins are patent. Gallbladder is unremarkable. No intra or extrahepatic biliary ductal dilatation. Unremarkable appearance of the bilateral kidneys. No focal solid lesion, hydronephrosis or nephrolithiasis. 2.2 cm simple cyst in the interpolar right kidney. Multiple loops of abnormal dilated and fluid-filled small bowel throughout the abdomen. There is a parastomal hernia about the left lower quadrant colonic abscess containing a short-segment of the dilated small bowel, however this does not appear to be the source of the obstruction is the bowel is dilated on both sides  of this segment. There is a focal transition point to normal caliber bowel in the right hemi abdomen (image 47 series 201) at a region of focal angulation suggesting underlying adhesions. The bowel wall mucosa enhances. No evidence of ischemic complication, perforation or free air. No significant mesenteric edema. No free fluid. Colonic diverticular disease without CT evidence of active inflammation. Prior sigmoid resection with Hartmann's pouch and left lower quadrant diverting ostomy. The Hartmann's  pouch appears unremarkable. Colonic diverticular disease without CT evidence of active inflammation. Pelvis: Suprapubic catheter. No evidence of leak or complication. Unremarkable prostate gland. Musculoskeletal: No acute fracture or aggressive appearing lytic or blastic osseous lesion. Vascular: Extensive atherosclerotic vascular calcifications. No evidence of aneurysm. 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 Bulky calcified plaque results in at least moderate narrowing of the left common femoral artery. IMPRESSION: 1. Positive for small bowel obstruction with a focal transition points in the right lower quadrant likely secondary to underlying adhesions. No evidence of significant inflammation, bowel ischemic change or perforation. 2. Surgical changes of prior sigmoidectomy with Hartmann's pouch and left lower quadrant diverting colostomy. 3. Parastomal hernia containing omental fat and a short segment of small bowel. No evidence of mechanical obstruction at this location. 4. Bilateral lower lobe bronchiectasis with areas of partial bronchial impaction, atelectasis and pleural parenchymal scarring. 5. Diverticulosis of the residual left colon without evidence of active diverticulitis. 6. Extensive atherosclerotic vascular calcifications including a high-grade stenosis of the left common iliac artery and an at  least moderate stenosis of the left common femoral artery. Does the patient have clinical symptoms of left lower extremity claudication? Electronically Signed   By: Jacqulynn Cadet M.D.   On: 03/01/2015 16:42   Dg Abd 2 Views  03/05/2015  CLINICAL DATA:  Abdominal pain. EXAM: ABDOMEN - 2 VIEW COMPARISON:  03/04/2015.  CT 03/01/2015. FINDINGS: NG tube has been removed. Persistent dilated loops of small bowel noted. Colonic gas pattern is normal. No free air. No acute bony abnormality. IMPRESSION: 1. Interim removal of NG tube. 2. Persistent dilated loops of small bowel are noted. Colonic gas pattern is normal. No free air. Electronically Signed   By: Marcello Moores  Register   On: 03/05/2015 08:21   Dg Abd 2 Views  03/04/2015  CLINICAL DATA:  Followup small bowel obstruction. EXAM: ABDOMEN - 2 VIEW COMPARISON:  03/03/2015. FINDINGS: Nasogastric tube tip in the proximal stomach. The side hole is in the distal esophagus. Multiple dilated small bowel loops with improvement. The largest caliber loop currently has a diameter of 4.0 cm. Gas and stool in normal caliber colon. Mild lumbar spine degenerative changes. No free peritoneal air. IMPRESSION: 1. Nasogastric tube side hole in the distal esophagus. It is recommended that this be advanced. 2. Improving partial small bowel obstruction. Electronically Signed   By: Claudie Revering M.D.   On: 03/04/2015 10:09   Dg Abd 2 Views  03/03/2015  CLINICAL DATA:  70 year old male with small bowel obstruction EXAM: ABDOMEN - 2 VIEW COMPARISON:  Prior abdominal radiograph 03/02/2015 FINDINGS: Diffuse gaseous distention and dilatation of small bowel throughout the mid and upper abdomen. Maximal small bowel diameter is 5.7 cm in the right upper quadrant. A nasogastric tube is in place with the tip overlying the stomach. Overall, the bowel gas pattern is similar to slightly worse compared to yesterday. No evidence of free air. IMPRESSION: 1. Persistent, and perhaps slightly worse small  bowel obstruction. Maximal small bowel diameter is 5.7 cm in the right upper quadrant. 2. No evidence of free air on the decubitus views. 3. Gastric tube remains in good position. Electronically Signed   By: Jacqulynn Cadet M.D.   On: 03/03/2015 09:45   Dg Abd Acute W/chest  03/02/2015  CLINICAL DATA:  Leukocytosis.  NG tube in place.  Vomiting. EXAM: DG ABDOMEN ACUTE W/ 1V CHEST COMPARISON:  Abdominal radiograph -  05/13/2013; chest radiograph - 04/26/2013; CT abdomen pelvis - 03/01/2015 FINDINGS: Grossly unchanged cardiac silhouette and mediastinal contours with extensive calcified atherosclerotic plaque within a tortuous and potentially mildly ectatic thoracic aorta. There is unchanged mild elevation of left hemidiaphragm. Unchanged left basilar heterogeneous opacities favored to represent atelectasis. No pleural effusion or pneumothorax. No evidence of edema. Shrapnel overlies the right mid lung, unchanged. Enteric tube tip projects over the expected location of the distal esophagus. There is moderate to marked gas distention of multiple loops of small bowel with index loop of small bowel with the right upper abdominal quadrant measuring approximately 4.6 cm in diameter. There is a paucity of distal colonic gas. No pneumoperitoneum, pneumatosis or portal venous gas. Excreted contrast from prior IV contrast-enhanced abdominal CT is seen within the urinary bladder. 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 No acute osseus abnormalities IMPRESSION: 1. No acute cardiopulmonary disease. 2. Enteric tube side port projected location distal esophagus. Advancement at least 7 cm is recommended. 3. Findings worrisome for high-grade small bowel obstruction. Continued attention on follow-up is recommended Electronically Signed   By: Sandi Mariscal M.D.   On: 03/02/2015 08:08   Dg Abd Portable 1v  03/02/2015  CLINICAL DATA:  NG tube advanced. EXAM: PORTABLE ABDOMEN - 1 VIEW COMPARISON:  03/02/2015 FINDINGS: NG tube  has been advanced with the tip now in the mid stomach. Gaseous distention of small bowel loops again noted, concerning for small bowel obstruction, not significantly changed. IMPRESSION: NG tube has been advanced with the tip now in the mid stomach. Electronically Signed   By: Rolm Baptise M.D.   On: 03/02/2015 12:02     Assessment/Plan   ICD-9-CM ICD-10-CM   1. Toe blister without infection, right, initial encounter 917.2 S90.424A   2. Urine retention 788.20 R33.9   3. Foley catheter in place V45.89 Z92.89   4. 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 Severe protein-calorie malnutrition ([place]) 262 E43   6. GERD without esophagitis 530.81 K21.9   7. Chronic bronchitis, unspecified chronic bronchitis type ([place]) 491.9 J42   8. Essential hypertension 401.9 I10   9. Paroxysmal atrial fibrillation (HCC) 427.31 I48.0   10. Hypothyroidism due to acquired atrophy of thyroid 244.8 E03.8    246.8 E03.4   11. Chronic pain 338.29 G89.29   12. Depression 311 F32.9     Wound care team to eval and tx right 2nd toe blister  Cont other meds as ordered  PT/OT as ordered  Foley cath care as indicated  Colostomy care as indicated  GOAL: long term care. May need palliative care due to worsening FTT. Communicated with pt and nursing.  Will follow  Dalissa Lovin S. Perlie Gold  [facility] [address] [place], [place] [address] [phone] Cell (Monday-Friday 8 AM - 5 PM) [phone] After 5 PM and follow prompts

## 2015-03-14 LAB — CBC AND DIFFERENTIAL
HCT: 28 % — AB (ref 41–53)
HEMOGLOBIN: 8.5 g/dL — AB (ref 13.5–17.5)
PLATELETS: 297 10*3/uL (ref 150–399)
WBC: 8.6 10^3/mL

## 2015-03-14 LAB — BASIC METABOLIC PANEL
BUN: 12 mg/dL (ref 4–21)
CREATININE: 0.9 mg/dL (ref 0.6–1.3)
GLUCOSE: 70 mg/dL
POTASSIUM: 4.5 mmol/L (ref 3.4–5.3)
Sodium: 140 mmol/L (ref 137–147)

## 2015-03-30 ENCOUNTER — Encounter (HOSPITAL_COMMUNITY): Payer: Medicare Other

## 2015-03-30 ENCOUNTER — Encounter: Payer: Self-pay | Admitting: Vascular Surgery

## 2015-03-30 ENCOUNTER — Other Ambulatory Visit: Payer: Self-pay | Admitting: *Deleted

## 2015-03-30 ENCOUNTER — Ambulatory Visit (INDEPENDENT_AMBULATORY_CARE_PROVIDER_SITE_OTHER): Payer: Medicare Other | Admitting: Vascular Surgery

## 2015-03-30 VITALS — BP 120/58 | HR 75 | Temp 97.2°F | Ht 70.0 in | Wt 152.0 lb

## 2015-03-30 DIAGNOSIS — I7025 Atherosclerosis of native arteries of other extremities with ulceration: Secondary | ICD-10-CM | POA: Diagnosis not present

## 2015-03-30 DIAGNOSIS — I739 Peripheral vascular disease, unspecified: Secondary | ICD-10-CM

## 2015-03-30 DIAGNOSIS — L02611 Cutaneous abscess of right foot: Secondary | ICD-10-CM | POA: Insufficient documentation

## 2015-03-30 NOTE — Progress Notes (Addendum)
Referring Physician:Deborah Green  History of Present Illness:  Patient is a [age] y.o. year old male who presents for evaluation of right foot second toe infection.  He states 3 weeks ago his care giver was getting him out of bed and he noticed a wound on his right second toe.  He states it is painful at times.  He is non ambulatory with obvious waisting and malnutrition.  He has  A foley and colostomy bag in place.  He appears chronically ill.    O ther medical problems include has Hypothyroidism; Depression; A-fib (HCC); GERD (gastroesophageal reflux disease); Insomnia; HTN (hypertension); COPD (chronic obstructive pulmonary disease) (HCC); Dysphagia; Anemia; Hypokalemia; Chronic pain; UTI (urinary tract infection); Urine retention; Aspiration pneumonia (HCC); HCAP (healthcare-associated pneumonia); Leukocytosis; Protein-calorie malnutrition, severe (HCC); Anemia of chronic disease; GERD without esophagitis; Encounter for palliative care; Loss of weight; Nausea and vomiting; Ileus, unspecified (HCC); Small bowel obstruction, partial (HCC); Dehydration; AKI (acute kidney injury) (HCC); Bowel obstruction (HCC); Peripheral vascular disease (HCC); Failure to thrive in adult; Pressure ulcer; Palliative care encounter; and SBO (small bowel obstruction) (HCC) on his problem list.  Past Medical History  Diagnosis Date  . COPD (chronic obstructive pulmonary disease) (HCC)   . Hypertension   . Myocardial infarction (HCC)   . A-fib (HCC)   . GERD (gastroesophageal reflux disease)   . Hepatitis B 04/27/2013  . Dysphagia 04/27/2013  . Diverticulosis 03/05/2013  . Sepsis (HCC) 04/26/2013  . Alcoholic cirrhosis of liver without ascites (HCC)   . Coronary artery disease   . Thyroid disease   . Depression   . Urinary retention     Past Surgical History  Procedure Laterality Date  . Colostomy    . Peg tube placement    . Peg tube removal      Social History Social History  Substance Use Topics  .  Smoking status: Former Games developermoker  . Smokeless tobacco: None  . Alcohol Use: No    Family History Family History: patient is unable to detail the medical history of his parents.  He has no family present.  Allergies  No Known Allergies   Current Outpatient Prescriptions  Medication Sig Dispense Refill  . amiodarone (PACERONE) 200 MG tablet Take 200 mg by mouth daily.    Marland Kitchen. aspirin EC 81 MG tablet Take 81 mg by mouth daily.    . cetirizine (ZYRTEC) 10 MG tablet Take 10 mg by mouth daily as needed for allergies.     Marland Kitchen. escitalopram (LEXAPRO) 10 MG tablet Take 10 mg by mouth daily.    . Fluticasone Furoate-Vilanterol (BREO ELLIPTA) 100-25 MCG/INH AEPB Inhale 1 puff into the lungs daily.     . folic acid (FOLVITE) 1 MG tablet Take 1 mg by mouth daily.    . hydrOXYzine (ATARAX/VISTARIL) 25 MG tablet Take 25 mg by mouth every 6 (six) hours as needed for itching.    . iron polysaccharides (NIFEREX) 150 MG capsule Take 1 capsule (150 mg total) by mouth daily.    . K Phos Mono-Sod Phos Di & Mono (PHOSPHA 250 NEUTRAL PO) Take 250 mg by mouth 2 (two) times daily.     Marland Kitchen. lamoTRIgine (LAMICTAL) 25 MG tablet Take 25 mg  by mouth daily. 25 mg po qd x7 then increase to 50 mg po every day after    . levothyroxine (SYNTHROID, LEVOTHROID) 50 MCG tablet Take 50 mcg by mouth daily.    Marland Kitchen. levothyroxine (SYNTHROID, LEVOTHROID) 75 MCG tablet Take 75  mcg by mouth daily before breakfast.    . Morphine Sulfate (MORPHINE CONCENTRATE) 10 mg / 0.5 ml concentrated solution Take 5 mg by mouth every 4 (four) hours as needed for severe pain.    . Multiple Vitamins-Minerals (DECUBI-VITE PO) Take 2 capsules by mouth daily.     . Nutritional Supplements (COMPLETE PROTEIN/VITAMIN SHAKE PO) Take 1 Dose by mouth 2 (two) times daily.    Marland Kitchen omeprazole (PRILOSEC) 20 MG capsule Take 20 mg by mouth daily.    . promethazine (PHENERGAN) 25 MG tablet Take 25 mg by mouth every 6 (six) hours as needed for nausea or vomiting.    . thiamine 100  MG tablet Take 100 mg by mouth daily.     No current facility-administered medications for this visit.    ROS:   General:  No weight loss, Fever, chills  HEENT: No recent headaches, no nasal bleeding, no visual changes, no sore throat  Neurologic: No dizziness, blackouts, seizures. No recent symptoms of stroke or mini- stroke. No recent episodes of slurred speech, or temporary blindness.  Cardiac: No recent episodes of chest pain/pressure, no shortness of breath at rest.  No shortness of breath with exertion.  Denies history of atrial fibrillation or irregular heartbeat  Vascular: No history of rest pain in feet.  No history of claudication.  No history of non-healing ulcer, No history of DVT   Pulmonary: No home oxygen, no productive cough, no hemoptysis,  No asthma or wheezing.  On O2 constantly  Musculoskeletal:  Minimal knee extension/flexion and arm mobility   Arthritis,  Low back pain,   Joint pain  Hematologic:No history of hypercoagulable state.  No history of easy bleeding.  No history of anemia  Gastrointestinal: No hematochezia or melena,  No gastroesophageal reflux, no trouble swallowing  Urinary:  chronic Kidney disease,  on HD -  MWF or  TTHS,  Burning with urination,  Frequent urination,  Difficulty urinating;   Skin: No rashes  Psychological: No history of anxiety,  No history of depression   Physical Examination  Filed Vitals:   03/30/15 1331 03/30/15 1333  BP: 132/66 120/58  Pulse: 75 75  Temp: 97.2 F (36.2 C)   TempSrc: Oral   Height:  (1.778 m)   Weight: 152 lb (68.947 kg)   SpO2: 95%     Body mass index is 21.81 kg/(m^2).  General:  Alert and oriented, no acute distress, ill appearing, cachectic HEENT: Normal Neck: No bruit or JVD Pulmonary: Clear to auscultation bilaterally, O2 in place Cardiac: Regular Rate and Rhythm without murmur Abdomen: Soft, non-tender, non-distended, no mass, healed incision,  Colostomy bag in place left LQ Skin: No rashes Extremity Pulses:  2+ radial, brachial, faint femoral, non palpable dorsalis pedis, posterior tibial pulses bilaterally Musculoskeletal: LE hip/knee/ankle contractures.  Muscle wasting.  Excoriations to LE.  Right second toe open ulcer with frank pus.  Unable to cooperate with full ROM testing Neurologic: Upper and lower extremity motor intact minimal joint movement all four extremities. Psychiatric: Judgment poor, Mood & affect flat Lymph : No Cervical or Inguinal lymphadenopathy   DATA:  CT with contrast 03/01/2015 IMPRESSION: 1. Positive for small bowel obstruction with a focal transition points in the right lower quadrant likely secondary to underlying adhesions. No evidence of significant inflammation, bowel ischemic change or perforation. 2. Surgical changes of prior sigmoidectomy with Hartmann's pouch and left lower quadrant diverting colostomy. 3. Parastomal hernia containing  omental fat and a short segment of small bowel. No evidence of mechanical obstruction at this location. 4. Bilateral lower lobe bronchiectasis with areas of partial bronchial impaction, atelectasis and pleural parenchymal scarring. 5. Diverticulosis of the residual left colon without evidence of active diverticulitis. 6. 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 Does the patient have clinical symptoms of left lower extremity claudication?  ABI's 03/26/2015  Left biphasic wave form CFA-monophasic femoral artery, Popliteal is patent. Right greater than left aorto iliac disease, mid SFA stenosis.  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 We recommend he be transported to [facility] for treat of frank second toe infection.  This will likely need amputation and antibiotics to prevent sepsis from reoccurring.   He has refused going to the ER.   A dry dressing was place over the wound.     Thomasena Edis, Iysha Mishkin [facility] PA-C [facility] [place] Office: [phone]  The patient was seen in conjunction with Dr. Imogene Burn  Addendum  I have independently interviewed and examined the patient, and I agree with the physician assistant's findings.  This patient has a complicated medical history.  He is chronically ill, which has likely contributed to the development of his right 2nd toe issue.  He has frank pus draining from the R 2nd toe.  I recommended he go to the hospital for admission to the hospitalist service for IV abx, and evaluation with Dr. Lajoyce Corners for debridement vs amputation of left 2nd toe.  This patient demonstrated some passive-aggressive personality traits, refusing multiple suggestions including the trip to ED for evaluation.  At some point, when he is more cooperative, he needs to consider an aortogram, bilateral leg runoff, and possible intervention for his iliac stenoses.  Leonides Sake, MD [facility] [place] Office: [phone] Pager: [phone]  03/30/2015, 3:00 PM

## 2015-04-03 ENCOUNTER — Non-Acute Institutional Stay (SKILLED_NURSING_FACILITY): Payer: Medicare Other | Admitting: Adult Health

## 2015-04-03 ENCOUNTER — Encounter: Payer: Self-pay | Admitting: Adult Health

## 2015-04-03 DIAGNOSIS — L97513 Non-pressure chronic ulcer of other part of right foot with necrosis of muscle: Secondary | ICD-10-CM | POA: Diagnosis not present

## 2015-04-03 LAB — PREALBUMIN: PREALBUMIN: 15

## 2015-04-03 NOTE — Progress Notes (Signed)
Patient ID: Craig [name], male   DOB: 1945/02/01, 70 y.o.   MRN: [id number]   Facility: [facility]       No Known Allergies  Chief Complaint  Patient presents with  . Acute Visit    Wound Mangement    HPI:  He was seen by vein and vascular on 03-30-15 who recommended that he have his right 2nd toe amputated. He declined at that time. The facility did call me with the report. I started on both zosyn and vancomycin through 05-11-15. He and I discussed the recommendations of the surgeon. He told me that he wanted to continue the IV abt at this time and does not want to have his toe amputated.   Past Medical History  Diagnosis Date  . COPD (chronic obstructive pulmonary disease) ([place])   . Hypertension   . Myocardial infarction ([place])   . A-fib ([place])   . GERD (gastroesophageal reflux disease)   . Hepatitis B 04/27/2013  . Dysphagia 04/27/2013  . Diverticulosis 03/05/2013  . Sepsis ([place]) 04/26/2013  . Alcoholic cirrhosis of liver without ascites ([place])   . Coronary artery disease   . Thyroid disease   . Depression   . Urinary retention     Past Surgical History  Procedure Laterality Date  . Colostomy    . Peg tube placement    . Peg tube removal      VITAL SIGNS BP 150/66 mmHg  Pulse 76  Temp(Src) 97.6 F (36.4 C) (Oral)  Resp 18  Ht _0  (1.753 m)  Wt 158 lb (71.668 kg)  BMI 23.32 kg/m2  SpO2 96%  Patient's Medications  New Prescriptions   No medications on file  Previous Medications   AMIODARONE (PACERONE) 200 MG TABLET    Take 200 mg by mouth daily.   ASPIRIN EC 81 MG TABLET    Take 81 mg by mouth daily.   CETIRIZINE (ZYRTEC) 10 MG TABLET    Take 10 mg by mouth daily as needed for allergies.    ESCITALOPRAM (LEXAPRO) 10 MG TABLET    Take 10 mg by mouth daily.   FLUTICASONE FUROATE-VILANTEROL (BREO ELLIPTA) 100-25 MCG/INH AEPB    Inhale 1 puff into the lungs daily.    FOLIC ACID (FOLVITE) 1 MG TABLET    Take 1 mg by mouth daily.   HYDROXYZINE  (ATARAX/VISTARIL) 25 MG TABLET    Take 25 mg by mouth every 6 (six) hours as needed for itching.   IRON POLYSACCHARIDES (NIFEREX) 150 MG CAPSULE    Take 1 capsule (150 mg total) by mouth daily.   K PHOS MONO-SOD PHOS DI & MONO (PHOSPHA 250 NEUTRAL PO)    Take 250 mg by mouth 2 (two) times daily.    LAMOTRIGINE (LAMICTAL) 25 MG TABLET    Take 25 mg  by mouth daily. 25 mg po qd x7 then increase to 50 mg po every day after   LEVOTHYROXINE (SYNTHROID, LEVOTHROID) 125 MCG TABLET    Take 125 mcg by mouth daily before breakfast.   LEVOTHYROXINE (SYNTHROID, LEVOTHROID) 50 MCG TABLET    Take 50 mcg by mouth daily. Reported on 04/03/2015   LEVOTHYROXINE (SYNTHROID, LEVOTHROID) 75 MCG TABLET    Take 75 mcg by mouth daily before breakfast. Reported on 04/03/2015   MELATONIN 3 MG TABS    Take 1 tablet by mouth at bedtime.   MORPHINE SULFATE (MORPHINE CONCENTRATE) 10 MG / 0.5 ML CONCENTRATED SOLUTION    Take 5 mg by mouth every  4 (four) hours as needed for severe pain. Reported on 04/03/2015   MULTIPLE VITAMINS-MINERALS (DECUBI-VITE PO)    Take 2 capsules by mouth daily.    NUTRITIONAL SUPPLEMENTS (COMPLETE PROTEIN/VITAMIN SHAKE PO)    Take 1 Dose by mouth 2 (two) times daily.   OMEPRAZOLE (PRILOSEC) 20 MG CAPSULE    Take 20 mg by mouth daily.   OXYGEN    2 L/min every shift. Maintain Sp02 >92   PIPERACILLIN-TAZOBACTAM (ZOSYN) 3.375 GM/50ML IVPB    Inject 3.375 g into the vein every 6 (six) hours. Until 4.28.17   POLYETHYLENE GLYCOL (MIRALAX / GLYCOLAX) PACKET    Take 17 g by mouth daily.   PROMETHAZINE (PHENERGAN) 25 MG TABLET    Take 25 mg by mouth every 6 (six) hours as needed for nausea or vomiting.   THIAMINE 100 MG TABLET    Take 100 mg by mouth daily.   UNABLE TO FIND    Med Name: Prostat 60 cc BID PO QD   UNABLE TO FIND    Med Name: House Supplement two times daily give with meals.   UNABLE TO FIND    Med Name: House 2.0 three times a day 90cc with medpass   VANCOMYCIN IN SODIUM CHLORIDE 0.9 % 250 ML     Inject 1,250 mg into the vein every 12 (twelve) hours.  Modified Medications   No medications on file  Discontinued Medications   No medications on file     SIGNIFICANT DIAGNOSTIC EXAMS  12-16-13: chest x-ray; right lower lobe atelectasis  08-26-14: s/p foley placement: Placement of 12 French suprapubic bladder drainage catheter. Urine return was cloudy and a sample of urine was sent for urinalysis and culture.  10-20-14: kub: non-specific gas pattern.   10-21-14: ct of abdomen and pelvis: 1. Focal collection of fluid and trace air at the anterior left mid abdominal wall, measuring 8.1 x 2.1 x 5.3 cm, with peripheral enhancement, compatible with an abscess. This appears to be at the prior site of a G-tube placement, as the stomach is tamped up to the abdominal wall underlying the abscess. This may still be contiguous with the stomach. 2. Prominent bronchiectasis at the lower lung lobes, with patchy bibasilar airspace opacities. This may reflect an atypical infectious process. Aspiration cannot be excluded. Trace right pleural effusion seen. 3. Left lower quadrant colostomy site is unremarkable in appearance. Hartmann's pouch is within normal limits. 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 Mild diverticulosis along the distal transverse, descending and proximal sigmoid colon, without evidence of diverticulitis. 4. Small bilateral renal cysts seen. 5. Diffuse calcification along the abdominal aorta and its branches. 6. 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  02-26-15: kub: mild ileus  03-01-15: KUB: at least a partial small bowel obstruction. Is worse.   03-01-15: ct of abdomen and pelvis: 1. Positive for small bowel obstruction with a focal transition points in the right lower quadrant likely secondary to underlying adhesions. No evidence of  significant inflammation, bowel ischemic change or perforation. 2. Surgical changes of prior sigmoidectomy with Hartmann's pouch and left lower quadrant diverting colostomy. 3. Parastomal hernia containing omental fat and a short segment of small bowel. No evidence of mechanical obstruction at this location. 4. Bilateral lower lobe bronchiectasis with areas of partial bronchial impaction, atelectasis and pleural parenchymal scarring. 5. Diverticulosis of the residual left colon without evidence of  active diverticulitis. 6. 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  03-05-15: kub: 1. Interim removal of NG tube. 2. Persistent dilated loops of small bowel are noted. Colonic gas pattern is normal. No free air    LABS REVIEWED:    04-26-14: glucose 81; bun 17; creat 1.33; k+4.7; na++139; phos 4.5; psa 1.24 08-14-14: wbc 8.8; hgb 8.2; hct 28.5; mcv 71.8; plt 250; glucose 72; bun 13; creat 1.13; k+ 4.7; na++140; liver normal albumin 3.2; phos 3.3; mag 1.6; tsh 0.298  08-26-14: wbc 11.0; hgb 7.7; hct 27.1; mcv 74.5. ;plt 297; glucose 124; bun 19; creat 1.21; k+3.6; na++141; liver normal albumin 2.9; urine culture: gram neg rods.  10-02-14: tsh 0.236  10-21-14 :wbc 15.7; hgb 7.6; hct 25.4; mcv 67.9; plt 402; glucose 87; bun 19; creat 1.14; k+ 3.8; na++135; liver normal albumin 2.7; blood culture X2: no growth; HIV; nr 10-22-14: wbc 8.2; hgb 7.0; hct 23.1; mcv 67.7; plt 365; glucose 83; bun 11; creat 1.06; k+ 3.6; n++139; tsh 0.088 10-23-14: urine culture: klebsiella pneumonia proteus mirabilis: septra 10-25-14; wbc 9.8; hgb 9.8; hct 31.3; mcv 72.8; plt 396; glucose 72; bun 9; creat 0.85; k+ 3.7; na++139; tsh 0.267 12-08-14: tsh 0.195; free T4: 1.62 (normal)  02-26-15: wbc 13,4 hgb 10,6l hct 34.5 mcv 73.2; plt 336; glucose 66; bun 36; creat 1.34; k+ 5.1; na++ 134; liver  normal albumin 3.4; amylase 61; lipase 24; U/A +  03-01-15: wbc 16.0; hgb 10.4; hct 33.8; mcv 71.5; ;plt 335; glucose 99; bun 35; creat 1.41; k+ 3.5; na++136; ast 13; alt 11; alk phos 78; albumin 2.8 03-02-15: blood culture: no growth; urine culture: minimal: providencia stuartii; pseudomonas aeruginosa; proteus mirabilis 02-21-15: wbc 12.5; hgb 8.6; hct 29.5; mcv 73.9; plt 276; glucose 98; bun 7; creat 0.92; creat 4.5;na++138; vit B12: 1457; folate 47.4; iron 7; tibc 235; mag 1.9 pre-albumin 68  03-06-15: glucose 92; bun 7; creat 1.04; k+ 4.8; na++139       Review of Systems Constitutional: Negative for appetite change and fatigue.  HENT: Negative for congestion.   Respiratory: Negative for cough, chest tightness and shortness of breath.   Cardiovascular: Negative for chest pain, palpitations and leg swelling.  Gastrointestinal:no nausea vomiting or pain  Musculoskeletal: Negative for myalgias and arthralgias.  Skin: Negative for pallor. Psychiatric/Behavioral: The patient is not nervous/anxious.      Physical Exam Constitutional: frail. No distress.  Eyes: Conjunctivae are normal.  Neck: Neck supple. No JVD present. No thyromegaly present.  Cardiovascular: Normal rate, regular rhythm and post tibial and pedal pulses non-palpable    Respiratory: Effort normal and breath sounds normal. No respiratory distress. He has no wheezes.  GI: abdomen soft nontender; no distension; bowel sounds present in all four quads  Has left lower quad colostomy   Has s/p foley  Musculoskeletal: He exhibits no edema.  Able to move upper extremities Does not move lower extremities    Lymphadenopathy:    He has no cervical adenopathy.  Neurological: He is alert.  Skin: Skin is warm and dry. He is not diaphoretic.  Right second toe: necrotic with scant amount of purulent drainage present Psychiatric: He has a normal mood and affect      ASSESSMENT/ PLAN:  1. Right 2nd toe ulceration: will continue  current treatment and will continue IV abt; and will continue to monitor his status.    Time spent with  patient 40   minutes >50% time spent counseling; reviewing medical record; tests; labs; and developing future plan of care      Ok Edwards NP [facility]  Contact [phone] Monday through Friday 8am- 5pm  After hours call [phone]

## 2015-04-06 ENCOUNTER — Encounter: Payer: Medicare Other | Admitting: Surgery

## 2015-04-09 ENCOUNTER — Non-Acute Institutional Stay (SKILLED_NURSING_FACILITY): Payer: Medicare Other | Admitting: Adult Health

## 2015-04-09 ENCOUNTER — Encounter: Payer: Self-pay | Admitting: Adult Health

## 2015-04-09 DIAGNOSIS — L02611 Cutaneous abscess of right foot: Secondary | ICD-10-CM | POA: Diagnosis not present

## 2015-04-09 DIAGNOSIS — K219 Gastro-esophageal reflux disease without esophagitis: Secondary | ICD-10-CM | POA: Diagnosis not present

## 2015-04-09 DIAGNOSIS — I480 Paroxysmal atrial fibrillation: Secondary | ICD-10-CM

## 2015-04-09 DIAGNOSIS — I739 Peripheral vascular disease, unspecified: Secondary | ICD-10-CM

## 2015-04-09 DIAGNOSIS — E038 Other specified hypothyroidism: Secondary | ICD-10-CM

## 2015-04-09 DIAGNOSIS — J42 Unspecified chronic bronchitis: Secondary | ICD-10-CM | POA: Diagnosis not present

## 2015-04-09 DIAGNOSIS — D638 Anemia in other chronic diseases classified elsewhere: Secondary | ICD-10-CM | POA: Diagnosis not present

## 2015-04-09 DIAGNOSIS — I7025 Atherosclerosis of native arteries of other extremities with ulceration: Secondary | ICD-10-CM | POA: Diagnosis not present

## 2015-04-09 DIAGNOSIS — R339 Retention of urine, unspecified: Secondary | ICD-10-CM

## 2015-04-09 DIAGNOSIS — E034 Atrophy of thyroid (acquired): Secondary | ICD-10-CM

## 2015-04-09 DIAGNOSIS — R112 Nausea with vomiting, unspecified: Secondary | ICD-10-CM | POA: Diagnosis not present

## 2015-04-09 DIAGNOSIS — L97513 Non-pressure chronic ulcer of other part of right foot with necrosis of muscle: Secondary | ICD-10-CM | POA: Diagnosis not present

## 2015-04-09 DIAGNOSIS — E43 Unspecified severe protein-calorie malnutrition: Secondary | ICD-10-CM

## 2015-04-09 DIAGNOSIS — R627 Adult failure to thrive: Secondary | ICD-10-CM | POA: Diagnosis not present

## 2015-04-09 NOTE — Progress Notes (Signed)
Patient ID: Craig [name], male   DOB: [birth year], 70 y.o.   MRN: [id number]   Facility: [facility]     DNR   No Known Allergies  Chief Complaint  Patient presents with  . Medical Management of Chronic Issues    Follow up    HPI:  He is a long term resident of this facility being seen for the management of his chronic illnesses. He has been sen by vein and vascular on 03-30-15 who recommended that his right second toe be amputated. He has declined that recommendation at this time. He is presently on IV abt for his toe with vancomycin and zosyn through 05-11-15. He is complaining of nausea and vomiting. Her weight at this time is stable.   Past Medical History  Diagnosis Date  . COPD (chronic obstructive pulmonary disease) ([place])   . Hypertension   . Myocardial infarction ([place])   . A-fib ([place])   . GERD (gastroesophageal reflux disease)   . Hepatitis B 04/27/2013  . Dysphagia 04/27/2013  . Diverticulosis 03/05/2013  . Sepsis ([place]) 04/26/2013  . Alcoholic cirrhosis of liver without ascites ([place])   . Coronary artery disease   . Thyroid disease   . Depression   . Urinary retention     Past Surgical History  Procedure Laterality Date  . Colostomy    . Peg tube placement    . Peg tube removal      VITAL SIGNS BP 125/76 mmHg  Pulse 76  Temp(Src) 97.8 F (36.6 C) (Oral)  Resp 18  Ht _0  (1.753 m)  Wt 158 lb (71.668 kg)  BMI 23.32 kg/m2  SpO2 95%  Patient's Medications  New Prescriptions   No medications on file  Previous Medications   AMIODARONE (PACERONE) 200 MG TABLET    Take 200 mg by mouth daily.   ASPIRIN EC 81 MG TABLET    Take 81 mg by mouth daily.   CETIRIZINE (ZYRTEC) 10 MG TABLET    Take 10 mg by mouth daily as needed for allergies.    ESCITALOPRAM (LEXAPRO) 10 MG TABLET    Take 10 mg by mouth daily.   FLUTICASONE FUROATE-VILANTEROL (BREO ELLIPTA) 100-25 MCG/INH AEPB    Inhale 1 puff into the lungs daily.    FOLIC ACID (FOLVITE) 1 MG TABLET    Take 1 mg  by mouth daily.   HYDROXYZINE (ATARAX/VISTARIL) 25 MG TABLET    Take 25 mg by mouth every 6 (six) hours as needed for itching.   IRON POLYSACCHARIDES (NIFEREX) 150 MG CAPSULE    Take 1 capsule (150 mg total) by mouth daily.   K PHOS MONO-SOD PHOS DI & MONO (PHOSPHA 250 NEUTRAL PO)    Take 250 mg by mouth 2 (two) times daily.    LAMOTRIGINE (LAMICTAL) 25 MG TABLET    Take 25 mg  by mouth daily. 25 mg po qd x7 then increase to 50 mg po every day after   LEVOTHYROXINE (SYNTHROID, LEVOTHROID) 125 MCG TABLET    Take 125 mcg by mouth daily before breakfast.   MELATONIN 3 MG TABS    Take 1 tablet by mouth at bedtime.   MULTIPLE VITAMINS-MINERALS (DECUBI-VITE PO)    Take 2 capsules by mouth daily.    NUTRITIONAL SUPPLEMENTS (COMPLETE PROTEIN/VITAMIN SHAKE PO)    Take 1 Dose by mouth 2 (two) times daily.   OMEPRAZOLE (PRILOSEC) 20 MG CAPSULE    Take 20 mg by mouth daily.   OXYGEN    2  L/min every shift. Maintain Sp02 >92   PIPERACILLIN-TAZOBACTAM (ZOSYN) 3.375 GM/50ML IVPB    Inject 3.375 g into the vein every 6 (six) hours. Until 4.28.17   POLYETHYLENE GLYCOL (MIRALAX / GLYCOLAX) PACKET    Take 17 g by mouth daily.   PROMETHAZINE (PHENERGAN) 25 MG TABLET    Take 25 mg by mouth every 6 (six) hours as needed for nausea or vomiting.   THIAMINE 100 MG TABLET    Take 100 mg by mouth daily.   UNABLE TO FIND    Med Name: Prostat 60 cc BID PO QD   UNABLE TO FIND    Med Name: House 2.0 three times a day 90cc with medpass   VANCOMYCIN IN SODIUM CHLORIDE 0.9 % 250 ML    Inject 1,250 mg into the vein every 12 (twelve) hours.   VITAMIN C (ASCORBIC ACID) 500 MG TABLET    Take 500 mg by mouth 2 (two) times daily.  Modified Medications   No medications on file  Discontinued Medications   LEVOTHYROXINE (SYNTHROID, LEVOTHROID) 50 MCG TABLET    Take 50 mcg by mouth daily. Reported on 04/09/2015   LEVOTHYROXINE (SYNTHROID, LEVOTHROID) 75 MCG TABLET    Take 75 mcg by mouth daily before breakfast. Reported on 04/09/2015    MORPHINE SULFATE (MORPHINE CONCENTRATE) 10 MG / 0.5 ML CONCENTRATED SOLUTION    Take 5 mg by mouth every 4 (four) hours as needed for severe pain. Reported on 04/09/2015   UNABLE TO FIND    Reported on 04/09/2015     SIGNIFICANT DIAGNOSTIC EXAMS  12-16-13: chest x-ray; right lower lobe atelectasis  08-26-14: s/p foley placement: Placement of 12 French suprapubic bladder drainage catheter. Urine return was cloudy and a sample of urine was sent for urinalysis and culture.  10-20-14: kub: non-specific gas pattern.   10-21-14: ct of abdomen and pelvis: 1. Focal collection of fluid and trace air at the anterior left mid abdominal wall, measuring 8.1 x 2.1 x 5.3 cm, with peripheral enhancement, compatible with an abscess. This appears to be at the prior site of a G-tube placement, as the stomach is tamped up to the abdominal wall underlying the abscess. This may still be contiguous with the stomach. 2. Prominent bronchiectasis at the lower lung lobes, with patchy bibasilar airspace opacities. This may reflect an atypical infectious process. Aspiration cannot be excluded. Trace right pleural effusion seen. 3. Left lower quadrant colostomy site is unremarkable in appearance. Hartmann's pouch is within normal limits. 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 Mild diverticulosis along the distal transverse, descending and proximal sigmoid colon, without evidence of diverticulitis. 4. Small bilateral renal cysts seen. 5. Diffuse calcification along the abdominal aorta and its branches. 6. 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  02-26-15: kub: mild ileus  03-01-15: KUB: at least a partial small bowel obstruction. Is worse.   03-01-15: ct of abdomen and pelvis: 1. Positive for small bowel obstruction with a focal transition points in the right lower quadrant likely  secondary to underlying adhesions. No evidence of significant inflammation, bowel ischemic change or perforation. 2. Surgical changes of prior sigmoidectomy with Hartmann's pouch and left lower quadrant diverting colostomy. 3. Parastomal hernia containing omental fat and a short segment of small bowel. No evidence of mechanical obstruction at this location. 4. Bilateral lower lobe bronchiectasis with areas of partial bronchial impaction, atelectasis and  pleural parenchymal scarring. 5. Diverticulosis of the residual left colon without evidence of active diverticulitis. 6. 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  03-05-15: kub: 1. Interim removal of NG tube. 2. Persistent dilated loops of small bowel are noted. Colonic gas pattern is normal. No free air    LABS REVIEWED:    04-26-14: glucose 81; bun 17; creat 1.33; k+4.7; na++139; phos 4.5; psa 1.24 08-14-14: wbc 8.8; hgb 8.2; hct 28.5; mcv 71.8; plt 250; glucose 72; bun 13; creat 1.13; k+ 4.7; na++140; liver normal albumin 3.2; phos 3.3; mag 1.6; tsh 0.298  08-26-14: wbc 11.0; hgb 7.7; hct 27.1; mcv 74.5. ;plt 297; glucose 124; bun 19; creat 1.21; k+3.6; na++141; liver normal albumin 2.9; urine culture: gram neg rods.  10-02-14: tsh 0.236  10-21-14 :wbc 15.7; hgb 7.6; hct 25.4; mcv 67.9; plt 402; glucose 87; bun 19; creat 1.14; k+ 3.8; na++135; liver normal albumin 2.7; blood culture X2: no growth; HIV; nr 10-22-14: wbc 8.2; hgb 7.0; hct 23.1; mcv 67.7; plt 365; glucose 83; bun 11; creat 1.06; k+ 3.6; n++139; tsh 0.088 10-23-14: urine culture: klebsiella pneumonia proteus mirabilis: septra 10-25-14; wbc 9.8; hgb 9.8; hct 31.3; mcv 72.8; plt 396; glucose 72; bun 9; creat 0.85; k+ 3.7; na++139; tsh 0.267 12-08-14: tsh 0.195; free T4: 1.62 (normal)  02-26-15: wbc 13,4 hgb 10,6l hct 34.5 mcv 73.2; plt 336; glucose  66; bun 36; creat 1.34; k+ 5.1; na++ 134; liver normal albumin 3.4; amylase 61; lipase 24; U/A +  03-01-15: wbc 16.0; hgb 10.4; hct 33.8; mcv 71.5; ;plt 335; glucose 99; bun 35; creat 1.41; k+ 3.5; na++136; ast 13; alt 11; alk phos 78; albumin 2.8 03-02-15: blood culture: no growth; urine culture: minimal: providencia stuartii; pseudomonas aeruginosa; proteus mirabilis 02-21-15: wbc 12.5; hgb 8.6; hct 29.5; mcv 73.9; plt 276; glucose 98; bun 7; creat 0.92; creat 4.5;na++138; vit B12: 1457; folate 47.4; iron 7; tibc 235; mag 1.9 pre-albumin 68  03-06-15: glucose 92; bun 7; creat 1.04; k+ 4.8; na++139  03-14-15: wbc 8.6; hgb 8.5; hct 28.1; mcv 72.6; plt 297; glucose 70; bun 12; creat 0.91; k+ 4.5; na++140 pre-albumin 15      Review of Systems Constitutional: Negative for appetite change and fatigue.  HENT: Negative for congestion.   Respiratory: Negative for cough, chest tightness and shortness of breath.   Cardiovascular: Negative for chest pain, palpitations and leg swelling.  Gastrointestinal:has nausea and vomiting  Musculoskeletal: Negative for myalgias and arthralgias.  Skin: Negative for pallor. Psychiatric/Behavioral: The patient is not nervous/anxious.      Physical Exam Constitutional: frail. No distress.  Eyes: Conjunctivae are normal.  Neck: Neck supple. No JVD present. No thyromegaly present.  Cardiovascular: Normal rate, regular rhythm and post tibial and pedal pulses non-palpable    Respiratory: Effort normal and breath sounds normal. No respiratory distress. He has no wheezes.  GI: abdomen soft nontender; no distension; bowel sounds present in all four quads  Has left lower quad colostomy   Has s/p foley  Musculoskeletal: He exhibits no edema.  Able to move upper extremities Does not move lower extremities    Lymphadenopathy:    He has no cervical adenopathy.  Neurological: He is alert.  Skin: Skin is warm and dry. He is not diaphoretic.  Right second toe: necrotic  with scant amount of purulent drainage present Psychiatric: He has a normal mood and affect  ASSESSMENT/ PLAN:  1. Hypothyroidism: will continue synthroid 125 mcg daily;  tsh 0.195; free T4: 1.62   2. Malnutrition:  Current weight is 158 pounds will continue to monitor his weight in Sept 2016 was 179 pounds.  He has been hospitalized for a small bowel obstruction; will continue supplements per facility protocol   3. Afib: his heart rate is stable; will continue amiodarone 200 mg daily for rate control; will continue asa 81 mg daily will monitor his status.   4. Jerrye Bushy: will continue prilosec 20 mg daily  will monitor  5.  COPD: he is stable is 02 dependent; will continue breo ellipta 100/25 daily   6. Depression: is followed by psych services will continue lexapro at 10 mg daily    Will continue lamictal 25 mg twice daily to help stabilize mood   7. 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 Chronic pain:  Currently denies pain is not on medications; will monitor   9. Urine retention: is status post suprapubic foley  10. Anemia: will continue niferex 150 mg daily   11. Right 2nd toe ulceration: will continue current treatment and will continue IV abt; due to his nausea and vomiting will begin zofran 4 mg every 6 hours as needed and will monitor     Time spent with patient 40   minutes >50% time spent counseling; reviewing medical record; tests; labs; and developing future plan of care        Ok Edwards NP [facility]  Contact [phone] Monday through Friday 8am- 5pm  After hours call [phone]

## 2015-04-13 ENCOUNTER — Inpatient Hospital Stay (HOSPITAL_COMMUNITY)
Admission: EM | Admit: 2015-04-13 | Discharge: 2015-05-03 | DRG: 871 | Disposition: A | Discharge status: Skilled Nursing Facility | Payer: Medicare Other | Attending: Internal Medicine | Admitting: Internal Medicine

## 2015-04-13 ENCOUNTER — Encounter (HOSPITAL_COMMUNITY): Payer: Self-pay | Admitting: *Deleted

## 2015-04-13 ENCOUNTER — Emergency Department (HOSPITAL_COMMUNITY): Payer: Medicare Other

## 2015-04-13 ENCOUNTER — Non-Acute Institutional Stay (SKILLED_NURSING_FACILITY): Payer: Medicare Other | Admitting: Adult Health

## 2015-04-13 ENCOUNTER — Encounter: Payer: Self-pay | Admitting: Adult Health

## 2015-04-13 DIAGNOSIS — I252 Old myocardial infarction: Secondary | ICD-10-CM | POA: Diagnosis not present

## 2015-04-13 DIAGNOSIS — Z9981 Dependence on supplemental oxygen: Secondary | ICD-10-CM

## 2015-04-13 DIAGNOSIS — A4152 Sepsis due to Pseudomonas: Secondary | ICD-10-CM | POA: Diagnosis present

## 2015-04-13 DIAGNOSIS — Z79899 Other long term (current) drug therapy: Secondary | ICD-10-CM

## 2015-04-13 DIAGNOSIS — L02611 Cutaneous abscess of right foot: Secondary | ICD-10-CM | POA: Diagnosis present

## 2015-04-13 DIAGNOSIS — R64 Cachexia: Secondary | ICD-10-CM | POA: Diagnosis present

## 2015-04-13 DIAGNOSIS — N179 Acute kidney failure, unspecified: Secondary | ICD-10-CM | POA: Diagnosis present

## 2015-04-13 DIAGNOSIS — B3749 Other urogenital candidiasis: Secondary | ICD-10-CM | POA: Diagnosis present

## 2015-04-13 DIAGNOSIS — E876 Hypokalemia: Secondary | ICD-10-CM | POA: Diagnosis not present

## 2015-04-13 DIAGNOSIS — Z681 Body mass index (BMI) 19 or less, adult: Secondary | ICD-10-CM | POA: Diagnosis not present

## 2015-04-13 DIAGNOSIS — K7030 Alcoholic cirrhosis of liver without ascites: Secondary | ICD-10-CM | POA: Diagnosis present

## 2015-04-13 DIAGNOSIS — R34 Anuria and oliguria: Secondary | ICD-10-CM | POA: Diagnosis present

## 2015-04-13 DIAGNOSIS — N390 Urinary tract infection, site not specified: Secondary | ICD-10-CM | POA: Diagnosis present

## 2015-04-13 DIAGNOSIS — R0902 Hypoxemia: Secondary | ICD-10-CM

## 2015-04-13 DIAGNOSIS — L97513 Non-pressure chronic ulcer of other part of right foot with necrosis of muscle: Secondary | ICD-10-CM | POA: Diagnosis present

## 2015-04-13 DIAGNOSIS — I96 Gangrene, not elsewhere classified: Secondary | ICD-10-CM | POA: Diagnosis present

## 2015-04-13 DIAGNOSIS — A419 Sepsis, unspecified organism: Secondary | ICD-10-CM

## 2015-04-13 DIAGNOSIS — N170 Acute kidney failure with tubular necrosis: Secondary | ICD-10-CM | POA: Diagnosis present

## 2015-04-13 DIAGNOSIS — K219 Gastro-esophageal reflux disease without esophagitis: Secondary | ICD-10-CM | POA: Diagnosis present

## 2015-04-13 DIAGNOSIS — F0390 Unspecified dementia without behavioral disturbance: Secondary | ICD-10-CM | POA: Diagnosis present

## 2015-04-13 DIAGNOSIS — G9341 Metabolic encephalopathy: Secondary | ICD-10-CM | POA: Diagnosis present

## 2015-04-13 DIAGNOSIS — J9601 Acute respiratory failure with hypoxia: Secondary | ICD-10-CM | POA: Diagnosis present

## 2015-04-13 DIAGNOSIS — N320 Bladder-neck obstruction: Secondary | ICD-10-CM | POA: Diagnosis present

## 2015-04-13 DIAGNOSIS — I2510 Atherosclerotic heart disease of native coronary artery without angina pectoris: Secondary | ICD-10-CM | POA: Diagnosis present

## 2015-04-13 DIAGNOSIS — Z993 Dependence on wheelchair: Secondary | ICD-10-CM | POA: Diagnosis not present

## 2015-04-13 DIAGNOSIS — F329 Major depressive disorder, single episode, unspecified: Secondary | ICD-10-CM | POA: Diagnosis present

## 2015-04-13 DIAGNOSIS — E162 Hypoglycemia, unspecified: Secondary | ICD-10-CM | POA: Diagnosis not present

## 2015-04-13 DIAGNOSIS — D509 Iron deficiency anemia, unspecified: Secondary | ICD-10-CM | POA: Diagnosis present

## 2015-04-13 DIAGNOSIS — I739 Peripheral vascular disease, unspecified: Secondary | ICD-10-CM | POA: Diagnosis present

## 2015-04-13 DIAGNOSIS — L89601 Pressure ulcer of unspecified heel, stage 1: Secondary | ICD-10-CM | POA: Diagnosis present

## 2015-04-13 DIAGNOSIS — J449 Chronic obstructive pulmonary disease, unspecified: Secondary | ICD-10-CM | POA: Diagnosis present

## 2015-04-13 DIAGNOSIS — M86171 Other acute osteomyelitis, right ankle and foot: Secondary | ICD-10-CM | POA: Diagnosis present

## 2015-04-13 DIAGNOSIS — E43 Unspecified severe protein-calorie malnutrition: Secondary | ICD-10-CM | POA: Diagnosis present

## 2015-04-13 DIAGNOSIS — Z22322 Carrier or suspected carrier of Methicillin resistant Staphylococcus aureus: Secondary | ICD-10-CM | POA: Diagnosis not present

## 2015-04-13 DIAGNOSIS — M869 Osteomyelitis, unspecified: Secondary | ICD-10-CM | POA: Diagnosis present

## 2015-04-13 DIAGNOSIS — Z933 Colostomy status: Secondary | ICD-10-CM

## 2015-04-13 DIAGNOSIS — M8610 Other acute osteomyelitis, unspecified site: Secondary | ICD-10-CM | POA: Diagnosis not present

## 2015-04-13 DIAGNOSIS — Z09 Encounter for follow-up examination after completed treatment for conditions other than malignant neoplasm: Secondary | ICD-10-CM

## 2015-04-13 DIAGNOSIS — I120 Hypertensive chronic kidney disease with stage 5 chronic kidney disease or end stage renal disease: Secondary | ICD-10-CM | POA: Diagnosis present

## 2015-04-13 DIAGNOSIS — I480 Paroxysmal atrial fibrillation: Secondary | ICD-10-CM | POA: Diagnosis present

## 2015-04-13 DIAGNOSIS — Z87891 Personal history of nicotine dependence: Secondary | ICD-10-CM

## 2015-04-13 DIAGNOSIS — J9602 Acute respiratory failure with hypercapnia: Secondary | ICD-10-CM | POA: Diagnosis present

## 2015-04-13 DIAGNOSIS — Z452 Encounter for adjustment and management of vascular access device: Secondary | ICD-10-CM

## 2015-04-13 DIAGNOSIS — T368X5S Adverse effect of other systemic antibiotics, sequela: Secondary | ICD-10-CM | POA: Diagnosis not present

## 2015-04-13 DIAGNOSIS — T368X5A Adverse effect of other systemic antibiotics, initial encounter: Secondary | ICD-10-CM | POA: Diagnosis present

## 2015-04-13 DIAGNOSIS — Z7401 Bed confinement status: Secondary | ICD-10-CM

## 2015-04-13 DIAGNOSIS — N3000 Acute cystitis without hematuria: Secondary | ICD-10-CM | POA: Diagnosis not present

## 2015-04-13 DIAGNOSIS — O0489 (Induced) termination of pregnancy with other complications: Secondary | ICD-10-CM

## 2015-04-13 DIAGNOSIS — Z95828 Presence of other vascular implants and grafts: Secondary | ICD-10-CM

## 2015-04-13 DIAGNOSIS — Z789 Other specified health status: Secondary | ICD-10-CM | POA: Diagnosis not present

## 2015-04-13 DIAGNOSIS — Z515 Encounter for palliative care: Secondary | ICD-10-CM | POA: Diagnosis present

## 2015-04-13 DIAGNOSIS — E872 Acidosis: Secondary | ICD-10-CM | POA: Diagnosis present

## 2015-04-13 DIAGNOSIS — Z66 Do not resuscitate: Secondary | ICD-10-CM | POA: Diagnosis present

## 2015-04-13 DIAGNOSIS — N186 End stage renal disease: Secondary | ICD-10-CM | POA: Diagnosis present

## 2015-04-13 DIAGNOSIS — R627 Adult failure to thrive: Secondary | ICD-10-CM | POA: Diagnosis present

## 2015-04-13 DIAGNOSIS — Z7982 Long term (current) use of aspirin: Secondary | ICD-10-CM | POA: Diagnosis not present

## 2015-04-13 LAB — I-STAT CG4 LACTIC ACID, ED
Lactic Acid, Venous: 0.93 mmol/L (ref 0.5–2.0)
Lactic Acid, Venous: 1.04 mmol/L (ref 0.5–2.0)

## 2015-04-13 LAB — CBC WITH DIFFERENTIAL/PLATELET
BASOS PCT: 1 %
Basophils Absolute: 0.1 10*3/uL (ref 0.0–0.1)
Eosinophils Absolute: 0.2 10*3/uL (ref 0.0–0.7)
Eosinophils Relative: 2 %
HEMATOCRIT: 27.2 % — AB (ref 39.0–52.0)
HEMOGLOBIN: 8.5 g/dL — AB (ref 13.0–17.0)
LYMPHS ABS: 1.8 10*3/uL (ref 0.7–4.0)
Lymphocytes Relative: 14 %
MCH: 22.4 pg — ABNORMAL LOW (ref 26.0–34.0)
MCHC: 31.3 g/dL (ref 30.0–36.0)
MCV: 71.6 fL — ABNORMAL LOW (ref 78.0–100.0)
MONOS PCT: 9 %
Monocytes Absolute: 1.2 10*3/uL — ABNORMAL HIGH (ref 0.1–1.0)
NEUTROS ABS: 9.3 10*3/uL — AB (ref 1.7–7.7)
NEUTROS PCT: 74 %
Platelets: 309 10*3/uL (ref 150–400)
RBC: 3.8 MIL/uL — ABNORMAL LOW (ref 4.22–5.81)
RDW: 20.5 % — ABNORMAL HIGH (ref 11.5–15.5)
WBC: 12.6 10*3/uL — ABNORMAL HIGH (ref 4.0–10.5)

## 2015-04-13 LAB — I-STAT ARTERIAL BLOOD GAS, ED
ACID-BASE DEFICIT: 4 mmol/L — AB (ref 0.0–2.0)
Bicarbonate: 25 mEq/L — ABNORMAL HIGH (ref 20.0–24.0)
O2 Saturation: 91 %
PH ART: 7.198 — AB (ref 7.350–7.450)
PO2 ART: 73 mmHg — AB (ref 80.0–100.0)
TCO2: 27 mmol/L (ref 0–100)
pCO2 arterial: 63.6 mmHg (ref 35.0–45.0)

## 2015-04-13 LAB — COMPREHENSIVE METABOLIC PANEL
ALBUMIN: 2.3 g/dL — AB (ref 3.5–5.0)
ALT: 9 U/L — ABNORMAL LOW (ref 17–63)
ANION GAP: 13 (ref 5–15)
AST: 11 U/L — ABNORMAL LOW (ref 15–41)
Alkaline Phosphatase: 47 U/L (ref 38–126)
BUN: 40 mg/dL — ABNORMAL HIGH (ref 6–20)
CO2: 25 mmol/L (ref 22–32)
Calcium: 9 mg/dL (ref 8.9–10.3)
Chloride: 103 mmol/L (ref 101–111)
Creatinine, Ser: 8.44 mg/dL — ABNORMAL HIGH (ref 0.61–1.24)
GFR calc Af Amer: 7 mL/min — ABNORMAL LOW (ref >60)
GFR calc non Af Amer: 6 mL/min — ABNORMAL LOW (ref >60)
GLUCOSE: 74 mg/dL (ref 65–99)
POTASSIUM: 4.3 mmol/L (ref 3.5–5.1)
SODIUM: 141 mmol/L (ref 135–145)
TOTAL PROTEIN: 8 g/dL (ref 6.5–8.1)
Total Bilirubin: 1.3 mg/dL — ABNORMAL HIGH (ref 0.3–1.2)

## 2015-04-13 LAB — C-REACTIVE PROTEIN: CRP: 18.7 mg/dL — AB (ref <1.0)

## 2015-04-13 LAB — SEDIMENTATION RATE: Sed Rate: 133 mm/hr — ABNORMAL HIGH (ref 0–16)

## 2015-04-13 NOTE — ED Notes (Signed)
Pt to have xray for PICC placement prior to drawing labs/using PICC.

## 2015-04-13 NOTE — ED Notes (Signed)
Pt returned to xray 

## 2015-04-13 NOTE — ED Notes (Signed)
Dr. Carter at bedside.

## 2015-04-13 NOTE — ED Provider Notes (Signed)
CSN: [id number]     Arrival date & time 04/13/15  1720 History   First MD Initiated Contact with Patient 04/13/15 1754     Chief Complaint  Patient presents with  . Wound Infection     (Consider location/radiation/quality/duration/timing/severity/associated sxs/prior Treatment) Patient is a 70 y.o. male presenting with general illness. The history is provided by the patient and the nursing home.  Illness Location:  Right second toe Quality:  Worsening infection Severity:  Severe Onset quality:  Gradual Duration: weeks. Timing:  Constant Progression:  Worsening Chronicity:  Chronic Context:  Patient with Chronic wound to 2nd toe of right foot. Previous reccomendations for amputation with patient refusal. on Vanc and Zosyn through PICC as outpatient but worsening infection per SNF Relieved by:  Nothing Worsened by:  Time Ineffective treatments:  Vanc and Zosyn Associated symptoms: fatigue   Associated symptoms: no abdominal pain, no chest pain, no diarrhea, no fever, no headaches, no myalgias, no nausea, no rash, no sore throat and no vomiting     Past Medical History  Diagnosis Date  . COPD (chronic obstructive pulmonary disease) ([facility])   . Hypertension   . Myocardial infarction ([facility])   . A-fib ([facility])   . GERD (gastroesophageal reflux disease)   . Hepatitis B 04/27/2013  . Dysphagia 04/27/2013  . Diverticulosis 03/05/2013  . Sepsis ([facility]) 04/26/2013  . Alcoholic cirrhosis of liver without ascites ([facility])   . Coronary artery disease   . Thyroid disease   . Depression   . Urinary retention    Past Surgical History  Procedure Laterality Date  . Colostomy    . Peg tube placement    . Peg tube removal     No family history on file. Social History  Substance Use Topics  . Smoking status: Former Games developermoker  . Smokeless tobacco: None  . Alcohol Use: No    Review of Systems  Constitutional: Positive for fatigue. Negative for fever, diaphoresis, activity change and appetite  change.  HENT: Negative for facial swelling, sore throat, tinnitus, trouble swallowing and voice change.   Eyes: Negative for pain, redness and visual disturbance.  Respiratory: Negative for chest tightness.   Cardiovascular: Negative for chest pain, palpitations and leg swelling.  Gastrointestinal: Negative for nausea, vomiting, abdominal pain, diarrhea, constipation and abdominal distention.  Endocrine: Negative.   Genitourinary: Negative.  Negative for dysuria, decreased urine volume, scrotal swelling and testicular pain.  Musculoskeletal: Negative for myalgias, back pain and gait problem.  Skin: Positive for wound. Negative for rash.  Neurological: Negative.  Negative for dizziness, tremors, weakness and headaches.  Psychiatric/Behavioral: Positive for confusion. Negative for suicidal ideas, hallucinations and self-injury. The patient is not nervous/anxious.       Allergies  Review of patient's allergies indicates no known allergies.  Home Medications   Prior to Admission medications   Medication Sig Start Date End Date Taking? Authorizing Provider  Amino Acids-Protein Hydrolys (FEEDING SUPPLEMENT, PRO-STAT SUGAR FREE 64,) LIQD Take 60 mLs by mouth 2 (two) times daily. 9am, 6pm   Yes Historical Provider, MD  amiodarone (PACERONE) 200 MG tablet Take 200 mg by mouth daily.   Yes Historical Provider, MD  aspirin EC 81 MG tablet Take 81 mg by mouth daily.   Yes Historical Provider, MD  cetirizine (ZYRTEC) 10 MG tablet Take 10 mg by mouth daily as needed for allergies.    Yes Historical Provider, MD  escitalopram (LEXAPRO) 10 MG tablet Take 10 mg by mouth daily.   Yes Historical Provider, MD  Fluticasone Furoate-Vilanterol (BREO ELLIPTA) 100-25 MCG/INH AEPB Inhale 1 puff into the lungs daily.    Yes Historical Provider, MD  folic acid (FOLVITE) 1 MG tablet Take 1 mg by mouth daily.   Yes Historical Provider, MD  hydrOXYzine (ATARAX/VISTARIL) 25 MG tablet Take 25 mg by mouth every 6 (six)  hours as needed for itching.   Yes Historical Provider, MD  Iron Polysacch Cmplx-B12-FA 150-0.025-1 MG CAPS Take 1 capsule by mouth daily.   Yes Historical Provider, MD  K Phos Mono-Sod Phos Di & Mono (PHOSPHA 250 NEUTRAL PO) Take 250 mg by mouth 2 (two) times daily. 9am, 6pm   Yes Historical Provider, MD  lamoTRIgine (LAMICTAL) 25 MG tablet Take 50 mg by mouth daily.    Yes Historical Provider, MD  levothyroxine (SYNTHROID, LEVOTHROID) 125 MCG tablet Take 125 mcg by mouth daily before breakfast.   Yes Historical Provider, MD  Melatonin 3 MG TABS Take 3 mg by mouth at bedtime.    Yes Historical Provider, MD  morphine (ROXANOL) 20 MG/ML concentrated solution Take 4 mg by mouth every 2 (two) hours as needed for severe pain.   Yes Historical Provider, MD  Multiple Vitamins-Minerals (DECUBI-VITE PO) Take 2 capsules by mouth daily.    Yes Historical Provider, MD  Nutritional Supplements (NUTRITIONAL SUPPLEMENT PO) Take by mouth 2 (two) times daily with a meal. House Supplement   Yes Historical Provider, MD  Nutritional Supplements (NUTRITIONAL SUPPLEMENT PO) Take 90 mLs by mouth 3 (three) times daily. House 2.0 supplement   Yes Historical Provider, MD  omeprazole (PRILOSEC) 20 MG capsule Take 20 mg by mouth daily before breakfast.    Yes Historical Provider, MD  ondansetron (ZOFRAN) 4 MG tablet Take 4 mg by mouth every 6 (six) hours as needed for nausea or vomiting.   Yes Historical Provider, MD  OXYGEN Inhale into the lungs continuous. 2 L/min  Maintain Sp02 >92%   Yes Historical Provider, MD  piperacillin-tazobactam (ZOSYN) 3.375 GM/50ML IVPB Inject 3.375 g into the vein every 6 (six) hours. 6 week course started 03/30/15 (end date 05/11/15)   Yes Historical Provider, MD  polyethylene glycol (MIRALAX / GLYCOLAX) packet Take 17 g by mouth daily. Mix in 8 oz liquid and drink   Yes Historical Provider, MD  Povidone-Iodine (BETADINE EX) Apply 1 application topically daily. Apply to right 2nd toe abscess area  daily   Yes Historical Provider, MD  promethazine (PHENERGAN) 25 MG tablet Take 25 mg by mouth every 6 (six) hours as needed for nausea or vomiting.   Yes Historical Provider, MD  thiamine 100 MG tablet Take 100 mg by mouth daily.   Yes Historical Provider, MD  vancomycin in sodium chloride 0.9 % 250 mL Inject 1,250 mg into the vein every 12 (twelve) hours. Start dated 04/03/15   Yes Historical Provider, MD  vitamin C (ASCORBIC ACID) 500 MG tablet Take 500 mg by mouth 2 (two) times daily. 9am, 6pm   Yes Historical Provider, MD  iron polysaccharides (NIFEREX) 150 MG capsule Take 1 capsule (150 mg total) by mouth daily. Patient not taking: Reported on 04/13/2015 10/23/14   Vassie Loll, MD   BP 162/60 mmHg  Pulse 85  Temp(Src) 97.6 F (36.4 C) (Oral)  Resp 19  Ht  (1.905 m)  Wt 72.576 kg  BMI 20.00 kg/m2  SpO2 97% Physical Exam  Constitutional: He appears well-developed and well-nourished. No distress.  HENT:  Head: Normocephalic and atraumatic.  Right Ear: External ear normal.  Left Ear:  External ear normal.  Eyes: Conjunctivae and EOM are normal. Pupils are equal, round, and reactive to light. No scleral icterus.  Neck: Normal range of motion. Neck supple. No JVD present. No tracheal deviation present. No thyromegaly present.  Cardiovascular: Normal rate.   Pulmonary/Chest: Effort normal and breath sounds normal. No stridor. No respiratory distress. He has no wheezes. He has no rales.  Abdominal: Soft. He exhibits no distension. There is no tenderness. There is no rebound and no guarding.  Musculoskeletal:  Chronic wound to right 2nd toe with thin yellow discharge.   Neurological: He is alert. No cranial nerve deficit. He exhibits normal muscle tone. Coordination normal.  Oriented to person, place, and situation but not to year on my exam. Speech slowed but understandable  Skin: Skin is warm and dry. No rash noted. He is not diaphoretic.  Psychiatric: He has a normal mood and  affect. His behavior is normal.  Nursing note and vitals reviewed.   ED Course  Procedures (including critical care time) Labs Review Labs Reviewed  COMPREHENSIVE METABOLIC PANEL - Abnormal; Notable for the following:    BUN 40 (*)    Creatinine, Ser 8.44 (*)    Albumin 2.3 (*)    AST 11 (*)    ALT 9 (*)    Total Bilirubin 1.3 (*)    GFR calc non Af Amer 6 (*)    GFR calc Af Amer 7 (*)    All other components within normal limits  CBC WITH DIFFERENTIAL/PLATELET - Abnormal; Notable for the following:    WBC 12.6 (*)    RBC 3.80 (*)    Hemoglobin 8.5 (*)    HCT 27.2 (*)    MCV 71.6 (*)    MCH 22.4 (*)    RDW 20.5 (*)    Neutro Abs 9.3 (*)    Monocytes Absolute 1.2 (*)    All other components within normal limits  SEDIMENTATION RATE - Abnormal; Notable for the following:    Sed Rate 133 (*)    All other components within normal limits  C-REACTIVE PROTEIN - Abnormal; Notable for the following:    CRP 18.7 (*)    All other components within normal limits  I-STAT ARTERIAL BLOOD GAS, ED - Abnormal; Notable for the following:    pH, Arterial 7.198 (*)    pCO2 arterial 63.6 (*)    pO2, Arterial 73.0 (*)    Bicarbonate 25.0 (*)    Acid-base deficit 4.0 (*)    All other components within normal limits  URINE CULTURE  URINALYSIS, ROUTINE W REFLEX MICROSCOPIC (NOT AT [facility])  BLOOD GAS, ARTERIAL  I-STAT CG4 LACTIC ACID, ED  I-STAT CG4 LACTIC ACID, ED  I-STAT CG4 LACTIC ACID, ED    Imaging Review Dg Chest 2 View  04/13/2015  CLINICAL DATA:  PICC line placement. EXAM: CHEST  2 VIEW COMPARISON:  March 02, 2015. FINDINGS: Stable cardiomediastinal silhouette. No pneumothorax or pleural effusion is noted. Atherosclerosis of thoracic aorta is noted. No acute pulmonary disease is noted. Right-sided PICC line is noted with distal tip in expected position of the SVC. Bony thorax is unremarkable. IMPRESSION: Right-sided PICC line is noted with distal tip in expected position of the SVC.  Electronically Signed   By: Lupita Raider, M.D.   On: 04/13/2015 19:26   Dg Foot Complete Right  04/13/2015  CLINICAL DATA:  Open wound of second toe. EXAM: RIGHT FOOT COMPLETE - 3+ VIEW COMPARISON:  April 27, 2013. FINDINGS: Vascular calcifications  are noted. Lytic destruction of the distal portion of the second proximal phalanx is noted consistent with osteomyelitis. No other acute bony abnormality is noted. No radiopaque foreign body is noted. IMPRESSION: Lytic destruction of the second proximal phalanx is noted consistent with acute osteomyelitis. Electronically Signed   By: Lupita Raider, M.D.   On: 04/13/2015 19:47   I have personally reviewed and evaluated these images and lab results as part of my medical decision-making.   EKG Interpretation None      MDM   Final diagnoses:  PICC (peripherally inserted central catheter) in place  Acute osteomyelitis of right foot [facility])    The patient is a 70 year old male with a history of chronic wound to the second right toe with previous recommendations for amputation by surgery which were refused by the patient who is currently being treated with vancomycin and Zosyn through PICC line at a skilled nursing facility who presents for concerns of worsening infection. On arrival patient is afebrile and non-toxic-appearing. Wound concerning for active infection. X-rays concerning for active osteomyelitis. Patient is mildly confused and unaware of the year with slurred speech. Blood gas obtained showing elevated CO2. Patient is placed on bicarbonate and effort to improve ventilation. Feel this is likely due to patient's COPD. Orthopedic surgery's consultation given the patient's wound and agrees to meet with patient and morning to discuss amputation. I've spoken to the patient about this and he states that he is considering amputation at this time. Patient is admitted to the hospitalist service for further management.  Patient seen with attending, Dr.  Juliann Pares, who oversaw clinical decision making.     Lula Olszewski, MD 04/13/15 2356  Lula Olszewski, MD 04/13/15 2357  Courteney Randall An, MD 04/14/15 [id number]

## 2015-04-13 NOTE — ED Notes (Signed)
[facility] with [facility]/o [facility] foot infections.  EMS reports pt receiving IV abx x 2 weeks for infection, not improving.  Would to right big toe and 2nd toe.   Pt a x 4 on arrival.  Afebrile.  BP-180/90 P-76 R-16 O2-94% 2L home O2.

## 2015-04-13 NOTE — ED Notes (Signed)
Pt taken back to xray. 

## 2015-04-13 NOTE — ED Notes (Signed)
Pt transported to xray 

## 2015-04-13 NOTE — ED Notes (Signed)
Respiratory at bedside initiating Bipap 

## 2015-04-13 NOTE — ED Notes (Signed)
Dr Montez Moritaarter [phone] here to see patient  Told this nurse patient was not to go anywhere until the MD or resident talks with her.  Questioned AMS, labs, septic?

## 2015-04-13 NOTE — ED Notes (Signed)
MD at bedside. 

## 2015-04-14 ENCOUNTER — Inpatient Hospital Stay (HOSPITAL_COMMUNITY): Payer: Medicare Other

## 2015-04-14 DIAGNOSIS — J9601 Acute respiratory failure with hypoxia: Secondary | ICD-10-CM

## 2015-04-14 DIAGNOSIS — J441 Chronic obstructive pulmonary disease with (acute) exacerbation: Secondary | ICD-10-CM

## 2015-04-14 DIAGNOSIS — A419 Sepsis, unspecified organism: Secondary | ICD-10-CM | POA: Diagnosis present

## 2015-04-14 DIAGNOSIS — I739 Peripheral vascular disease, unspecified: Secondary | ICD-10-CM

## 2015-04-14 DIAGNOSIS — R34 Anuria and oliguria: Secondary | ICD-10-CM

## 2015-04-14 DIAGNOSIS — J9602 Acute respiratory failure with hypercapnia: Secondary | ICD-10-CM

## 2015-04-14 DIAGNOSIS — E46 Unspecified protein-calorie malnutrition: Secondary | ICD-10-CM

## 2015-04-14 LAB — CBC
HCT: 26.4 % — ABNORMAL LOW (ref 39.0–52.0)
HEMATOCRIT: 25.6 % — AB (ref 39.0–52.0)
HEMOGLOBIN: 7.5 g/dL — AB (ref 13.0–17.0)
Hemoglobin: 7.3 g/dL — ABNORMAL LOW (ref 13.0–17.0)
MCH: 20.4 pg — AB (ref 26.0–34.0)
MCH: 20.5 pg — ABNORMAL LOW (ref 26.0–34.0)
MCHC: 28.4 g/dL — AB (ref 30.0–36.0)
MCHC: 28.5 g/dL — AB (ref 30.0–36.0)
MCV: 71.9 fL — ABNORMAL LOW (ref 78.0–100.0)
MCV: 71.9 fL — ABNORMAL LOW (ref 78.0–100.0)
PLATELETS: 250 10*3/uL (ref 150–400)
Platelets: 287 10*3/uL (ref 150–400)
RBC: 3.67 MIL/uL — ABNORMAL LOW (ref 4.22–5.81)
RBC: 3.56 MIL/uL — ABNORMAL LOW (ref 4.22–5.81)
RDW: 20.4 % — AB (ref 11.5–15.5)
RDW: 20.4 % — AB (ref 11.5–15.5)
WBC: 11.2 10*3/uL — ABNORMAL HIGH (ref 4.0–10.5)
WBC: 10.2 10*3/uL (ref 4.0–10.5)

## 2015-04-14 LAB — COMPREHENSIVE METABOLIC PANEL
ALK PHOS: 40 U/L (ref 38–126)
ALT: 9 U/L — AB (ref 17–63)
AST: 12 U/L — AB (ref 15–41)
Albumin: 2 g/dL — ABNORMAL LOW (ref 3.5–5.0)
Anion gap: 11 (ref 5–15)
BILIRUBIN TOTAL: 1.2 mg/dL (ref 0.3–1.2)
BUN: 40 mg/dL — AB (ref 6–20)
CALCIUM: 8.6 mg/dL — AB (ref 8.9–10.3)
CHLORIDE: 103 mmol/L (ref 101–111)
CO2: 29 mmol/L (ref 22–32)
CREATININE: 8.6 mg/dL — AB (ref 0.61–1.24)
GFR calc Af Amer: 6 mL/min — ABNORMAL LOW (ref >60)
GFR, EST NON AFRICAN AMERICAN: 6 mL/min — AB (ref >60)
Glucose, Bld: 72 mg/dL (ref 65–99)
Potassium: 4.5 mmol/L (ref 3.5–5.1)
Sodium: 143 mmol/L (ref 135–145)
TOTAL PROTEIN: 7 g/dL (ref 6.5–8.1)

## 2015-04-14 LAB — POCT I-STAT 3, ART BLOOD GAS (G3+)
Acid-base deficit: 4 mmol/L — ABNORMAL HIGH (ref 0.0–2.0)
Bicarbonate: 24.6 mEq/L — ABNORMAL HIGH (ref 20.0–24.0)
O2 Saturation: 87 %
PCO2 ART: 59.7 mmHg — AB (ref 35.0–45.0)
PH ART: 7.223 — AB (ref 7.350–7.450)
TCO2: 26 mmol/L (ref 0–100)
pO2, Arterial: 64 mmHg — ABNORMAL LOW (ref 80.0–100.0)

## 2015-04-14 LAB — CREATININE, URINE, RANDOM: Creatinine, Urine: 62.8 mg/dL

## 2015-04-14 LAB — I-STAT ARTERIAL BLOOD GAS, ED
Acid-base deficit: 4 mmol/L — ABNORMAL HIGH (ref 0.0–2.0)
Acid-base deficit: 3 mmol/L — ABNORMAL HIGH (ref 0.0–2.0)
BICARBONATE: 26.8 meq/L — AB (ref 20.0–24.0)
Bicarbonate: 26.2 mEq/L — ABNORMAL HIGH (ref 20.0–24.0)
O2 Saturation: 92 %
O2 Saturation: 95 %
PCO2 ART: 84.9 mmHg — AB (ref 35.0–45.0)
PCO2 ART: 78.3 mmHg — AB (ref 35.0–45.0)
PH ART: 7.097 — AB (ref 7.350–7.450)
PO2 ART: 90 mmHg (ref 80.0–100.0)
PO2 ART: 103 mmHg — AB (ref 80.0–100.0)
Patient temperature: 98.6
Patient temperature: 98.6
TCO2: 29 mmol/L (ref 0–100)
TCO2: 29 mmol/L (ref 0–100)
pH, Arterial: 7.142 — CL (ref 7.350–7.450)

## 2015-04-14 LAB — URINE MICROSCOPIC-ADD ON

## 2015-04-14 LAB — IRON AND TIBC
Iron: 16 ug/dL — ABNORMAL LOW (ref 45–182)
SATURATION RATIOS: 8 % — AB (ref 17.9–39.5)
TIBC: 211 ug/dL — AB (ref 250–450)
UIBC: 195 ug/dL

## 2015-04-14 LAB — PREALBUMIN: PREALBUMIN: 7 mg/dL — AB (ref 18–38)

## 2015-04-14 LAB — SODIUM, URINE, RANDOM: Sodium, Ur: 101 mmol/L

## 2015-04-14 LAB — VANCOMYCIN, RANDOM: VANCOMYCIN RM: 147 ug/mL — AB

## 2015-04-14 LAB — DIFFERENTIAL
Basophils Absolute: 0 10*3/uL (ref 0.0–0.1)
Basophils Relative: 0 %
EOS PCT: 0 %
Eosinophils Absolute: 0 10*3/uL (ref 0.0–0.7)
LYMPHS ABS: 0.7 10*3/uL (ref 0.7–4.0)
LYMPHS PCT: 6 %
Monocytes Absolute: 0.1 10*3/uL (ref 0.1–1.0)
Monocytes Relative: 1 %
NEUTROS PCT: 93 %
Neutro Abs: 9.3 10*3/uL — ABNORMAL HIGH (ref 1.7–7.7)

## 2015-04-14 LAB — PHOSPHORUS: Phosphorus: 8 mg/dL — ABNORMAL HIGH (ref 2.5–4.6)

## 2015-04-14 LAB — URINALYSIS, ROUTINE W REFLEX MICROSCOPIC
BILIRUBIN URINE: NEGATIVE
GLUCOSE, UA: NEGATIVE mg/dL
KETONES UR: 15 mg/dL — AB
Nitrite: POSITIVE — AB
Specific Gravity, Urine: 1.02 (ref 1.005–1.030)
pH: 6.5 (ref 5.0–8.0)

## 2015-04-14 LAB — TROPONIN I
TROPONIN I: 0.03 ng/mL (ref <0.031)
Troponin I: <0.03 ng/mL (ref <0.031)

## 2015-04-14 LAB — I-STAT CG4 LACTIC ACID, ED: LACTIC ACID, VENOUS: 0.4 mmol/L — AB (ref 0.5–2.0)

## 2015-04-14 LAB — MRSA PCR SCREENING: MRSA by PCR: POSITIVE — AB

## 2015-04-14 MED ORDER — SODIUM CHLORIDE 0.9% FLUSH
3.0000 mL | Freq: Two times a day (BID) | INTRAVENOUS | Status: DC
  Administered 2015-04-14: 3 mL via INTRAVENOUS
  Administered 2015-04-15: 15:00:00 via INTRAVENOUS
  Administered 2015-04-15 – 2015-04-29 (×10): 3 mL via INTRAVENOUS

## 2015-04-14 MED ORDER — ADULT MULTIVITAMIN W/MINERALS CH
1.0000 | ORAL_TABLET | Freq: Every day | ORAL | Status: DC
  Administered 2015-04-15 – 2015-04-18 (×4): 1 via ORAL
  Filled 2015-04-14 (×4): qty 1

## 2015-04-14 MED ORDER — PANTOPRAZOLE SODIUM 40 MG PO TBEC
40.0000 mg | DELAYED_RELEASE_TABLET | Freq: Every day | ORAL | Status: DC
  Administered 2015-04-15 – 2015-04-18 (×4): 40 mg via ORAL
  Filled 2015-04-14 (×4): qty 1

## 2015-04-14 MED ORDER — SODIUM BICARBONATE 4.2 % IV SOLN: 100.0000 meq | Freq: Once | INTRAVENOUS | Status: DC

## 2015-04-14 MED ORDER — PIPERACILLIN-TAZOBACTAM IN DEX 2-0.25 GM/50ML IV SOLN
2.2500 g | Freq: Three times a day (TID) | INTRAVENOUS | Status: DC
  Administered 2015-04-14: 2.25 g via INTRAVENOUS
  Filled 2015-04-14 (×3): qty 50

## 2015-04-14 MED ORDER — HEPARIN SODIUM (PORCINE) 5000 UNIT/ML IJ SOLN
5000.0000 [IU] | Freq: Three times a day (TID) | INTRAMUSCULAR | Status: DC
  Administered 2015-04-14 – 2015-04-29 (×38): 5000 [IU] via SUBCUTANEOUS
  Filled 2015-04-14 (×43): qty 1

## 2015-04-14 MED ORDER — PRO-STAT SUGAR FREE PO LIQD
30.0000 mL | Freq: Three times a day (TID) | ORAL | Status: DC
  Administered 2015-04-14 – 2015-04-18 (×7): 30 mL via ORAL
  Filled 2015-04-14 (×10): qty 30

## 2015-04-14 MED ORDER — VITAMIN B-1 100 MG PO TABS
100.0000 mg | ORAL_TABLET | Freq: Every day | ORAL | Status: DC
  Administered 2015-04-15 – 2015-04-18 (×4): 100 mg via ORAL
  Filled 2015-04-14 (×4): qty 1

## 2015-04-14 MED ORDER — SODIUM CHLORIDE 0.9 % IV SOLN
INTRAVENOUS | Status: DC
  Administered 2015-04-14: 05:00:00 via INTRAVENOUS
  Administered 2015-04-15: 1 mL/h via INTRAVENOUS

## 2015-04-14 MED ORDER — SODIUM CHLORIDE 0.9 % IV BOLUS (SEPSIS)
500.0000 mL | Freq: Once | INTRAVENOUS | Status: AC
  Administered 2015-04-14: 500 mL via INTRAVENOUS

## 2015-04-14 MED ORDER — LEVOTHYROXINE SODIUM 100 MCG PO TABS
125.0000 ug | ORAL_TABLET | Freq: Every day | ORAL | Status: DC
  Administered 2015-04-15 – 2015-04-19 (×5): 125 ug via ORAL
  Filled 2015-04-14 (×6): qty 1

## 2015-04-14 MED ORDER — ENSURE ENLIVE PO LIQD
237.0000 mL | Freq: Two times a day (BID) | ORAL | Status: DC
  Administered 2015-04-14 – 2015-04-16 (×3): 237 mL via ORAL

## 2015-04-14 MED ORDER — ACETAMINOPHEN 650 MG RE SUPP: 650.0000 mg | Freq: Four times a day (QID) | RECTAL | Status: DC | PRN

## 2015-04-14 MED ORDER — LAMOTRIGINE 25 MG PO TABS
50.0000 mg | ORAL_TABLET | Freq: Every day | ORAL | Status: DC
  Administered 2015-04-15 – 2015-04-18 (×4): 50 mg via ORAL
  Filled 2015-04-14 (×6): qty 2

## 2015-04-14 MED ORDER — SODIUM CHLORIDE 0.9 % IV SOLN
INTRAVENOUS | Status: DC
  Administered 2015-04-14 – 2015-04-19 (×2): via INTRAVENOUS

## 2015-04-14 MED ORDER — VITAMIN C 500 MG PO TABS
500.0000 mg | ORAL_TABLET | Freq: Two times a day (BID) | ORAL | Status: DC
  Administered 2015-04-15 – 2015-04-18 (×8): 500 mg via ORAL
  Filled 2015-04-14 (×9): qty 1

## 2015-04-14 MED ORDER — AMIODARONE HCL 200 MG PO TABS
200.0000 mg | ORAL_TABLET | Freq: Every day | ORAL | Status: DC
Start: 2015-04-14 — End: 2015-04-19
  Administered 2015-04-15 – 2015-04-18 (×4): 200 mg via ORAL
  Filled 2015-04-14 (×5): qty 1

## 2015-04-14 MED ORDER — ESCITALOPRAM OXALATE 10 MG PO TABS
10.0000 mg | ORAL_TABLET | Freq: Every day | ORAL | Status: DC
  Administered 2015-04-15 – 2015-04-18 (×4): 10 mg via ORAL
  Filled 2015-04-14 (×4): qty 1

## 2015-04-14 MED ORDER — SODIUM CHLORIDE 0.9 % IV SOLN
510.0000 mg | INTRAVENOUS | Status: DC
  Administered 2015-04-14: 510 mg via INTRAVENOUS
  Filled 2015-04-14: qty 17

## 2015-04-14 MED ORDER — ASPIRIN EC 81 MG PO TBEC
81.0000 mg | DELAYED_RELEASE_TABLET | Freq: Every day | ORAL | Status: DC
  Administered 2015-04-15 – 2015-04-18 (×4): 81 mg via ORAL
  Filled 2015-04-14 (×4): qty 1

## 2015-04-14 MED ORDER — IPRATROPIUM-ALBUTEROL 0.5-2.5 (3) MG/3ML IN SOLN
3.0000 mL | RESPIRATORY_TRACT | Status: DC
  Administered 2015-04-14 – 2015-04-15 (×9): 3 mL via RESPIRATORY_TRACT
  Filled 2015-04-14 (×10): qty 3

## 2015-04-14 MED ORDER — FLUTICASONE FUROATE-VILANTEROL 100-25 MCG/INH IN AEPB
1.0000 | INHALATION_SPRAY | Freq: Every day | RESPIRATORY_TRACT | Status: DC
  Administered 2015-04-16 – 2015-05-02 (×13): 1 via RESPIRATORY_TRACT
  Filled 2015-04-14 (×2): qty 28

## 2015-04-14 MED ORDER — METHYLPREDNISOLONE SODIUM SUCC 125 MG IJ SOLR
80.0000 mg | Freq: Four times a day (QID) | INTRAMUSCULAR | Status: DC
  Administered 2015-04-14 (×3): 80 mg via INTRAVENOUS
  Filled 2015-04-14 (×3): qty 2

## 2015-04-14 MED ORDER — ACETAMINOPHEN 325 MG PO TABS
650.0000 mg | ORAL_TABLET | Freq: Four times a day (QID) | ORAL | Status: DC | PRN
  Administered 2015-04-28 – 2015-05-03 (×2): 650 mg via ORAL
  Filled 2015-04-14 (×2): qty 2

## 2015-04-14 MED ORDER — SODIUM BICARBONATE 8.4 % IV SOLN
100.0000 meq | Freq: Once | INTRAVENOUS | Status: AC
  Administered 2015-04-14: 100 meq via INTRAVENOUS
  Filled 2015-04-14: qty 50

## 2015-04-14 MED ORDER — FOLIC ACID 1 MG PO TABS
1.0000 mg | ORAL_TABLET | Freq: Every day | ORAL | Status: DC
  Administered 2015-04-15 – 2015-04-18 (×4): 1 mg via ORAL
  Filled 2015-04-14 (×4): qty 1

## 2015-04-14 MED ORDER — SODIUM CHLORIDE 0.9 % IV BOLUS (SEPSIS)
1000.0000 mL | Freq: Once | INTRAVENOUS | Status: AC
  Administered 2015-04-14: 1000 mL via INTRAVENOUS

## 2015-04-14 NOTE — Progress Notes (Signed)
Pharmacy Antibiotic Note  Elayne GuerinWilliam Sandberg is a 70 y.o. male admitted on 04/13/2015 with osteomyelitis.  Pharmacy has been consulted for Vancomycin/Zosyn dosing. Pt had been receiving Vancomycin and Zosyn at nursing facility. Pt in ED this AM with right foot infection not healing.   Pt currently in acute renal failure (baseline is normal), so will check a random vancomycin level which I expect to be high  Plan: -Hold vancomycin for now and check random level -Zosyn 2.25g IV q8h -Trend WBC, temp, renal function   Height: 6\' 3"  (190.5 cm) Weight: 160 lb (72.576 kg) IBW/kg (Calculated) : 84.5  Temp (24hrs), Avg:97.6 F (36.4 C), Min:97.6 F (36.4 C), Max:97.6 F (36.4 C)   Recent Labs Lab 04/13/15 2115 04/13/15 2144 04/13/15 2154 04/14/15 0057  WBC 12.6*  --   --   --   CREATININE 8.44*  --   --   --   LATICACIDVEN  --  0.93 1.04 0.40*    Estimated Creatinine Clearance: 8.6 mL/min (by C-G formula based on Cr of 8.44).    No Known Allergies  Abran DukeLedford, Jamyia Fortune 04/14/2015 3:00 AM

## 2015-04-14 NOTE — Progress Notes (Signed)
Patient transported from ED D33 to 2H18 on Bipap. Vitals remained stable.

## 2015-04-14 NOTE — ED Notes (Signed)
phelbotomist to bedside to draw blood cultures

## 2015-04-14 NOTE — Progress Notes (Signed)
Patient currently on 6L/min nasal cannula. O2 sats dropping in the 80's. Will continue to monitor, however, patient currently refusing all medications, as well as any other respiratory interventions at this time. MD on call notified at this time. Milon DikesKristina D Shawnmichael Parenteau, RN

## 2015-04-14 NOTE — H&P (Addendum)
[facility] History and Physical  Krystofer Hevener EQA:[id number] DOB: 1945-05-29 DOA: 04/13/2015  PCP: Gildardo Cranker, DO   Chief Complaint: Gangrenous toe, needs amputation  HPI: Craig [name] is a 70 y.o. gentleman with a history of COPD, atrial fibrillation, PVD, malnutrition, and chronic wounds who is a long-term SNF patient (according to his emergency contact Jac Canavan, the patient has a sister and brother that have been estranged for the past several years).  He is alone and cannot give me his clinical history.  This HPI obtained from EMR review, SNF personnel, and emergency contact.  His last admission in February was for AKI (Cr 1.45), SBO, and UTI.  He was seen by palliative care during that visit, and DNR status was discussed and confirmed.  It has been upheld at his SNF (appropriate paper work was received by fax tonight).  During his last admission, his CT scan of A/P with contrast showed PVD and extensive atherosclerotic calcifications.  He was subsequently referred to Vascular surgery by his outpatient provider when he developed a nonhealing wound to the second toe on his right foot (of note, he already has an unstageable wound to his left heel).  The patient was seen on 3/17 and was told that he needed IV antibiotics and probable amputation of the toe.  He did not consent to surgery at that time and never came to the hospital.  He has been on empiric IV vancomycin and zosyn as prescribed by his outpatient provider.  I do not see surveillance labs.  He was apparently referred to the ED this evening to finally pursue surgical intervention.    When I met the patient, he appeared acutely encephalopathic.  His speech was muffled, but he could indicate pain.  He knew that he was in the hospital and could give the year, but he could not give me any history.  STAT ABG showed a pH of 7.19 and pCO2 in the mid 60's with O2 sat in the 70's.  He was placed on BiPAP therapy and step down bed  was requested.    Unfortunately, further lab testing shows AKI with creatinine of 8 (his creatinine is normal at baseline), but potassium and bicarb levels are acceptable.  He has a mild leukocytosis, but his lactic acid levels have been normal.  We do not have a urine sample because he has been anuric.  Ironically, the patient seems slightly more responsive on repeat examination than when I met him earlier in the day.  However, his ABG shows a worsening respiratory acidosis.  PO2 levels improved.  DNR status confirmed with SNF, emergency contact.  Emergency contact notified of severity of illness and will attempt to reach the patient's stranged family.  Review of Systems: Unable to obtain due to altered mental status.  Past Medical History  Diagnosis Date  . COPD (chronic obstructive pulmonary disease) ([place])   . Hypertension   . Myocardial infarction ([place])   . A-fib ([place])   . GERD (gastroesophageal reflux disease)   . Hepatitis B 04/27/2013  . Dysphagia 04/27/2013  . Diverticulosis 03/05/2013  . Sepsis ([place]) 04/26/2013  . Alcoholic cirrhosis of liver without ascites ([place])   . Coronary artery disease   . Thyroid disease   . Depression   . Urinary retention    Past Surgical History  Procedure Laterality Date  . Colostomy    . Peg tube placement    . Peg tube removal     Social History:  Social History  Social History Narrative  Former smoker.  No EtOH or illicit drug use.  Has spent time in prison.  SNF resident, estranged from family.  No Known Allergies  No family history on file. Unable to obtain due to altered mental status.  Prior to Admission medications   Medication Sig Start Date End Date Taking? Authorizing Provider  Amino Acids-Protein Hydrolys (FEEDING SUPPLEMENT, PRO-STAT SUGAR FREE 64,) LIQD Take 60 mLs by mouth 2 (two) times daily. 9am, 6pm   Yes Historical Provider, MD  amiodarone (PACERONE) 200 MG tablet Take 200 mg by mouth daily.   Yes Historical Provider, MD   aspirin EC 81 MG tablet Take 81 mg by mouth daily.   Yes Historical Provider, MD  cetirizine (ZYRTEC) 10 MG tablet Take 10 mg by mouth daily as needed for allergies.    Yes Historical Provider, MD  escitalopram (LEXAPRO) 10 MG tablet Take 10 mg by mouth daily.   Yes Historical Provider, MD  Fluticasone Furoate-Vilanterol (BREO ELLIPTA) 100-25 MCG/INH AEPB Inhale 1 puff into the lungs daily.    Yes Historical Provider, MD  folic acid (FOLVITE) 1 MG tablet Take 1 mg by mouth daily.   Yes Historical Provider, MD  hydrOXYzine (ATARAX/VISTARIL) 25 MG tablet Take 25 mg by mouth every 6 (six) hours as needed for itching.   Yes Historical Provider, MD  Iron Polysacch Cmplx-B12-FA 150-0.025-1 MG CAPS Take 1 capsule by mouth daily.   Yes Historical Provider, MD  K Phos Mono-Sod Phos Di & Mono (PHOSPHA 250 NEUTRAL PO) Take 250 mg by mouth 2 (two) times daily. 9am, 6pm   Yes Historical Provider, MD  lamoTRIgine (LAMICTAL) 25 MG tablet Take 50 mg by mouth daily.    Yes Historical Provider, MD  levothyroxine (SYNTHROID, LEVOTHROID) 125 MCG tablet Take 125 mcg by mouth daily before breakfast.   Yes Historical Provider, MD  Melatonin 3 MG TABS Take 3 mg by mouth at bedtime.    Yes Historical Provider, MD  morphine (ROXANOL) 20 MG/ML concentrated solution Take 4 mg by mouth every 2 (two) hours as needed for severe pain.   Yes Historical Provider, MD  Multiple Vitamins-Minerals (DECUBI-VITE PO) Take 2 capsules by mouth daily.    Yes Historical Provider, MD  Nutritional Supplements (NUTRITIONAL SUPPLEMENT PO) Take by mouth 2 (two) times daily with a meal. House Supplement   Yes Historical Provider, MD  Nutritional Supplements (NUTRITIONAL SUPPLEMENT PO) Take 90 mLs by mouth 3 (three) times daily. House 2.0 supplement   Yes Historical Provider, MD  omeprazole (PRILOSEC) 20 MG capsule Take 20 mg by mouth daily before breakfast.    Yes Historical Provider, MD  ondansetron (ZOFRAN) 4 MG tablet Take 4 mg by mouth every 6  (six) hours as needed for nausea or vomiting.   Yes Historical Provider, MD  OXYGEN Inhale into the lungs continuous. 2 L/min  Maintain Sp02 >92%   Yes Historical Provider, MD  piperacillin-tazobactam (ZOSYN) 3.375 GM/50ML IVPB Inject 3.375 g into the vein every 6 (six) hours. 6 week course started 03/30/15 (end date 05/11/15)   Yes Historical Provider, MD  polyethylene glycol (MIRALAX / GLYCOLAX) packet Take 17 g by mouth daily. Mix in 8 oz liquid and drink   Yes Historical Provider, MD  Povidone-Iodine (BETADINE EX) Apply 1 application topically daily. Apply to right 2nd toe abscess area daily   Yes Historical Provider, MD  promethazine (PHENERGAN) 25 MG tablet Take 25 mg by mouth every 6 (six) hours as needed for nausea or vomiting.  Yes Historical Provider, MD  thiamine 100 MG tablet Take 100 mg by mouth daily.   Yes Historical Provider, MD  vancomycin in sodium chloride 0.9 % 250 mL Inject 1,250 mg into the vein every 12 (twelve) hours. Start dated 04/03/15   Yes Historical Provider, MD  vitamin C (ASCORBIC ACID) 500 MG tablet Take 500 mg by mouth 2 (two) times daily. 9am, 6pm   Yes Historical Provider, MD   Physical Exam: Filed Vitals:   04/14/15 0130 04/14/15 0200 04/14/15 0230 04/14/15 0300  BP: 153/56 155/54 171/60 136/48  Pulse: 74 73 74 67  Temp:      TempSrc:      Resp: 19 17 15 19   Height:      Weight:      SpO2: 99% 100% 99% 100%     General:  Arouses to voice.  NAD.  Thin man, chronically ill appearing.  Eyes: pupils react bilaterally  ENT: Poor dentition, mouth is dry.  Neck: Supple.    Cardiovascular: NR/RR.  No LE edema.  Respiratory: Diminished bilaterally, no significant wheeze.  Abdomen: Soft/NT/ND.  Bowel sounds are present.  No guarding.  Skin: Waxy, scaly, feet are extremely dry.  Actually has eschar present on multiple toes on both feet.  Obvious ulceration on the second toe on the right foot.  Musculoskeletal: Moves all four extremities spontaneously.   Squeezes my hands on command.  Left heel dressing in place; I did not remove.  Psychiatric: Flat affect.  Neurologic: Strength symmetric bilaterally.  Labs on Admission:  Basic Metabolic Panel:  Recent Labs Lab 04/13/15 2115  NA 141  K 4.3  CL 103  CO2 25  GLUCOSE 74  BUN 40*  CREATININE 8.44*  CALCIUM 9.0   Liver Function Tests:  Recent Labs Lab 04/13/15 2115  AST 11*  ALT 9*  ALKPHOS 47  BILITOT 1.3*  PROT 8.0  ALBUMIN 2.3*   CBC:  Recent Labs Lab 04/13/15 2115  WBC 12.6*  NEUTROABS 9.3*  HGB 8.5*  HCT 27.2*  MCV 71.6*  PLT 309   Radiological Exams on Admission: Dg Chest 2 View  04/13/2015  CLINICAL DATA:  PICC line placement. EXAM: CHEST  2 VIEW COMPARISON:  March 02, 2015. FINDINGS: Stable cardiomediastinal silhouette. No pneumothorax or pleural effusion is noted. Atherosclerosis of thoracic aorta is noted. No acute pulmonary disease is noted. Right-sided PICC line is noted with distal tip in expected position of the SVC. Bony thorax is unremarkable. IMPRESSION: Right-sided PICC line is noted with distal tip in expected position of the SVC. Electronically Signed   By: Marijo Conception, M.D.   On: 04/13/2015 19:26   Dg Foot Complete Right  04/13/2015  CLINICAL DATA:  Open wound of second toe. EXAM: RIGHT FOOT COMPLETE - 3+ VIEW COMPARISON:  April 27, 2013. FINDINGS: Vascular calcifications are noted. Lytic destruction of the distal portion of the second proximal phalanx is noted consistent with osteomyelitis. No other acute bony abnormality is noted. No radiopaque foreign body is noted. IMPRESSION: Lytic destruction of the second proximal phalanx is noted consistent with acute osteomyelitis. Electronically Signed   By: Marijo Conception, M.D.   On: 04/13/2015 19:47    KGM:[id number] Troponin: Pending  Assessment/Plan Active Problems:   AKI (acute kidney injury) ([place])   Osteomyelitis (HCC)  Admit to stepdown unit  AKI, likely secondary to vanc/zosyn  administration and possible sepsis, secondary to acute osteomyelitis --Pharmacy to dose antibiotics for now, in the face of AKI --Will need  nephrology consult in the AM.  Deferred tonight because bicarb and potassium levels were stable.  Repeat BMP pending.  IV fluid challenge and monitor strict I/O.  With his medical history and DNR status, will need to determine if he is truly a candidate for long-term HD. --Renal ultrasound in the AM --Blood cultures pending --Urine studies pending, patient has been anuric --Dr. Sharol Given accepted ortho consult and should see the patient in the morning  Respiratory acidosis --Chest xray did not show acute process.  No active wheezing on exam.  History of COPD.  Continue BiPAP.  Can try empiric steroids, nebulizer treatments.  Clinically, he is not behaving like his pH is 7.0.  Two amps of bicarb ordered now.  Repeat BMP is pending. --Remarkably, he has been afebrile and hemodynamically stable up to this point.  Code Status: DNR Family Communication: Severity of illness discussed with emergency contact Jac Canavan, by phone [phone].  Case was also discussed with the on call physician and NP for the [facility] group [phone].  DNR documents faxed from [facility] [phone].  Disposition Plan: Anticipate transition to comfort measures in the next 24 hours if he does not respond to aggressive medical therapy, including BiPAP.  Time spent: 90 minutes, including critical care  Eber Jones [facility]  04/14/2015, 3:08 AM       Physician who accepted the after hours call on behalf of Dr. Gildardo Cranker was Dr. Serita Sheller.  We will not intubate, consistent with DNR status, but we will not de-escalate care at this point.  I discussed this with RN in the ED.  Page sent to [facility] Triage provider; awaiting call back.

## 2015-04-14 NOTE — Consult Note (Signed)
WOC wound consult note Reason for Consult: right foot second toe ulceration with osteomyelitis, previous left heel Stage 4 ulcer-healed, previous sacral ulcer-healed Wound type:PVD Pressure Ulcer POA: No Measurement: Right 2nd toe with 1cm x 1cm ulceration with depth obscured due to the presence of necrotic tissue Wound bed: As described above.  Drainage (amount, consistency, odor) Dried serosanguinous exudate and crusted serum is noted between toes. Periwound: Several areas of previously healed full thickness ulcerations noted on the LEs and sacrum as patient has been bed bound for several years. Dressing procedure/placement/frequency: I will implement a conservative POC a tthis time:  Heels will be floated, LEs cleansed with our house skin cleanser, moisturized and dry gauze placed between each toe on the right foot to absorb exudate. A prophylactic dressing is placed on the sacrum to prevent re-injury to the previously healed area.   WOC ostomy consult note Stoma type/location: LLQ Colosotmy Stomal assessment/size: 1 and 1/4 inch oval, slightly budded, visualized through intact pouching system. Peristomal assessment: not seen today Treatment options for stomal/peristomal skin: none indicated Output: semi formed light brown stool Ostomy pouching: 2pc. 2 and 1/4 inch pouching system Education provided: None (Patient is not self care) Enrolled patient in [employer] DC program: No   WOC nursing team will not follow, but will remain available to this patient, the nursing and medical teams.  Please re-consult if needed. Thanks, Ladona MowLaurie Derya Dettmann, MSN, RN, GNP, Hans EdenCWOCN, CWON-AP, FAAN  Pager# [phone]

## 2015-04-14 NOTE — ED Notes (Signed)
Dr.Carter made aware of repeat ABG results; will contact Dr.Campos if intubation is needed

## 2015-04-14 NOTE — Progress Notes (Signed)
Pt is currently off of bi-pap and on a 2L [place]. Pt states that he wears 2 liters at home. RT will continue to monitor.

## 2015-04-14 NOTE — Consult Note (Signed)
Reason for Consult:AKI  Referring Physician: Dr. Flonnie Overman Craig [name] is an 70 y.o. male.  HPI: 70 yr male with long hx COPD, HTN, MI, Afib, Hep B, divertic, depression, thyroid disease, CAD, Cirrhosis, urinary retention.  Recent admit for SBO, urinary retention and mild ^ CR to 1.4.  Most recent Cr .2.  Brought from NH now with worsened MS, resp distress, and low bp.  Felt to have sepsis from Osteomyelitis.  Has been on Vanc and Zosyn @ NH for Osteo.  Random vanc 147.   No known hx or renal dz . Cr 8.1 now. ?? Urine vol.  No NSAIDS or ACEI.  On BIPAP now. Constitutional: limited ability to answer with BIPAP Eyes: negative Respiratory: denies issues but ?? reliable Cardiovascular: negative Gastrointestinal: negative Musculoskeletal:knows of sores on feet. Allergic/Immunologic: negative    Past Medical History  Diagnosis Date  . COPD (chronic obstructive pulmonary disease) ([place])   . Hypertension   . Myocardial infarction ([place])   . A-fib ([place])   . GERD (gastroesophageal reflux disease)   . Hepatitis B 04/27/2013  . Dysphagia 04/27/2013  . Diverticulosis 03/05/2013  . Sepsis ([place]) 04/26/2013  . Alcoholic cirrhosis of liver without ascites ([place])   . Coronary artery disease   . Thyroid disease   . Depression   . Urinary retention     Past Surgical History  Procedure Laterality Date  . Colostomy    . Peg tube placement    . Peg tube removal      No family history on file.  Social History:  reports that he has quit smoking. He does not have any smokeless tobacco history on file. He reports that he does not drink alcohol or use illicit drugs.  Allergies: No Known Allergies  Medications:  I have reviewed the patient's current medications. Prior to Admission:  Prescriptions prior to admission  Medication Sig Dispense Refill Last Dose  . Amino Acids-Protein Hydrolys (FEEDING SUPPLEMENT, PRO-STAT SUGAR FREE 64,) LIQD Take 60 mLs by mouth 2 (two) times daily. 9am, 6pm   04/12/2015  at 1800  . amiodarone (PACERONE) 200 MG tablet Take 200 mg by mouth daily.   04/13/2015 at 900  . aspirin EC 81 MG tablet Take 81 mg by mouth daily.   04/13/2015 at 900  . cetirizine (ZYRTEC) 10 MG tablet Take 10 mg by mouth daily as needed for allergies.    unknown  . escitalopram (LEXAPRO) 10 MG tablet Take 10 mg by mouth daily.   04/13/2015 at 900  . Fluticasone Furoate-Vilanterol (BREO ELLIPTA) 100-25 MCG/INH AEPB Inhale 1 puff into the lungs daily.    04/13/2015 at 900  . folic acid (FOLVITE) 1 MG tablet Take 1 mg by mouth daily.   04/13/2015 at 900  . hydrOXYzine (ATARAX/VISTARIL) 25 MG tablet Take 25 mg by mouth every 6 (six) hours as needed for itching.   unknown  . Iron Polysacch Cmplx-B12-FA 150-0.025-1 MG CAPS Take 1 capsule by mouth daily.   04/13/2015 at Unknown time  . K Phos Mono-Sod Phos Di & Mono (PHOSPHA 250 NEUTRAL PO) Take 250 mg by mouth 2 (two) times daily. 9am, 6pm   04/13/2015 at 900  . lamoTRIgine (LAMICTAL) 25 MG tablet Take 50 mg by mouth daily.    04/13/2015 at 900  . levothyroxine (SYNTHROID, LEVOTHROID) 125 MCG tablet Take 125 mcg by mouth daily before breakfast.   04/13/2015 at 730  . Melatonin 3 MG TABS Take 3 mg by mouth at bedtime.  04/12/2015 at 2100  . morphine (ROXANOL) 20 MG/ML concentrated solution Take 4 mg by mouth every 2 (two) hours as needed for severe pain.   04/11/2015 at 0931  . Multiple Vitamins-Minerals (DECUBI-VITE PO) Take 2 capsules by mouth daily.    04/13/2015 at 900  . Nutritional Supplements (NUTRITIONAL SUPPLEMENT PO) Take by mouth 2 (two) times daily with a meal. House Supplement   04/08/2015  . Nutritional Supplements (NUTRITIONAL SUPPLEMENT PO) Take 90 mLs by mouth 3 (three) times daily. House 2.0 supplement   04/12/2015 at 1800  . omeprazole (PRILOSEC) 20 MG capsule Take 20 mg by mouth daily before breakfast.    04/13/2015 at 730  . ondansetron (ZOFRAN) 4 MG tablet Take 4 mg by mouth every 6 (six) hours as needed for nausea or vomiting.   unknown  .  OXYGEN Inhale into the lungs continuous. 2 L/min  Maintain Sp02 >92%   04/13/2015 at Unknown time  . piperacillin-tazobactam (ZOSYN) 3.375 GM/50ML IVPB Inject 3.375 g into the vein every 6 (six) hours. 6 week course started 03/30/15 (end date 05/11/15)   04/13/2015 at 1200  . polyethylene glycol (MIRALAX / GLYCOLAX) packet Take 17 g by mouth daily. Mix in 8 oz liquid and drink   04/13/2015 at 900  . Povidone-Iodine (BETADINE EX) Apply 1 application topically daily. Apply to right 2nd toe abscess area daily   04/13/2015 at 700  . promethazine (PHENERGAN) 25 MG tablet Take 25 mg by mouth every 6 (six) hours as needed for nausea or vomiting.   04/09/2015 at 1039  . thiamine 100 MG tablet Take 100 mg by mouth daily.   04/13/2015 at 900  . vancomycin in sodium chloride 0.9 % 250 mL Inject 1,250 mg into the vein every 12 (twelve) hours. Start dated 04/03/15   04/13/2015 at 800  . vitamin C (ASCORBIC ACID) 500 MG tablet Take 500 mg by mouth 2 (two) times daily. 9am, 6pm   04/13/2015 at 900     Results for orders placed or performed during the hospital encounter of 04/13/15 (from the past 48 hour(s))  Comprehensive metabolic panel     Status: Abnormal   Collection Time: 04/13/15  9:15 PM  Result Value Ref Range   Sodium 141 135 - 145 mmol/L   Potassium 4.3 3.5 - 5.1 mmol/L   Chloride 103 101 - 111 mmol/L   CO2 25 22 - 32 mmol/L   Glucose, Bld 74 65 - 99 mg/dL   BUN 40 (H) 6 - 20 mg/dL   Creatinine, Ser 8.44 (H) 0.61 - 1.24 mg/dL   Calcium 9.0 8.9 - 10.3 mg/dL   Total Protein 8.0 6.5 - 8.1 g/dL   Albumin 2.3 (L) 3.5 - 5.0 g/dL   AST 11 (L) 15 - 41 U/L   ALT 9 (L) 17 - 63 U/L   Alkaline Phosphatase 47 38 - 126 U/L   Total Bilirubin 1.3 (H) 0.3 - 1.2 mg/dL   GFR calc non Af Amer 6 (L) >60 mL/min   GFR calc Af Amer 7 (L) >60 mL/min    Comment: (NOTE) The eGFR has been calculated using the CKD EPI equation. This calculation has not been validated in all clinical situations. eGFR's persistently <60 mL/min  signify possible Chronic Kidney Disease.    Anion gap 13 5 - 15  CBC with Differential     Status: Abnormal   Collection Time: 04/13/15  9:15 PM  Result Value Ref Range   WBC 12.6 (H) 4.0 - 10.5  K/uL   RBC 3.80 (L) 4.22 - 5.81 MIL/uL   Hemoglobin 8.5 (L) 13.0 - 17.0 g/dL   HCT 27.2 (L) 39.0 - 52.0 %   MCV 71.6 (L) 78.0 - 100.0 fL   MCH 22.4 (L) 26.0 - 34.0 pg   MCHC 31.3 30.0 - 36.0 g/dL   RDW 20.5 (H) 11.5 - 15.5 %   Platelets 309 150 - 400 K/uL   Neutrophils Relative % 74 %   Neutro Abs 9.3 (H) 1.7 - 7.7 K/uL   Lymphocytes Relative 14 %   Lymphs Abs 1.8 0.7 - 4.0 K/uL   Monocytes Relative 9 %   Monocytes Absolute 1.2 (H) 0.1 - 1.0 K/uL   Eosinophils Relative 2 %   Eosinophils Absolute 0.2 0.0 - 0.7 K/uL   Basophils Relative 1 %   Basophils Absolute 0.1 0.0 - 0.1 K/uL  Sedimentation rate     Status: Abnormal   Collection Time: 04/13/15  9:15 PM  Result Value Ref Range   Sed Rate 133 (H) 0 - 16 mm/hr  C-reactive protein     Status: Abnormal   Collection Time: 04/13/15  9:15 PM  Result Value Ref Range   CRP 18.7 (H) <1.0 mg/dL  I-Stat CG4 Lactic Acid, ED (Not at [facility])     Status: None   Collection Time: 04/13/15  9:44 PM  Result Value Ref Range   Lactic Acid, Venous 0.93 0.5 - 2.0 mmol/L  I-Stat arterial blood gas, ED     Status: Abnormal   Collection Time: 04/13/15  9:47 PM  Result Value Ref Range   pH, Arterial 7.198 (LL) 7.350 - 7.450   pCO2 arterial 63.6 (HH) 35.0 - 45.0 mmHg   pO2, Arterial 73.0 (L) 80.0 - 100.0 mmHg   Bicarbonate 25.0 (H) 20.0 - 24.0 mEq/L   TCO2 27 0 - 100 mmol/L   O2 Saturation 91.0 %   Acid-base deficit 4.0 (H) 0.0 - 2.0 mmol/L   Patient temperature 97.6 F    Collection site RADIAL, ALLEN'S TEST ACCEPTABLE    Drawn by RT    Sample type ARTERIAL    Comment NOTIFIED PHYSICIAN   I-Stat CG4 Lactic Acid, ED (Not at [facility])     Status: None   Collection Time: 04/13/15  9:54 PM  Result Value Ref Range   Lactic Acid, Venous 1.04 0.5 - 2.0 mmol/L   I-Stat CG4 Lactic Acid, ED     Status: Abnormal   Collection Time: 04/14/15 12:57 AM  Result Value Ref Range   Lactic Acid, Venous 0.40 (L) 0.5 - 2.0 mmol/L  I-Stat arterial blood gas, ED     Status: Abnormal   Collection Time: 04/14/15  1:54 AM  Result Value Ref Range   pH, Arterial 7.097 (LL) 7.350 - 7.450   pCO2 arterial 84.9 (HH) 35.0 - 45.0 mmHg   pO2, Arterial 90.0 80.0 - 100.0 mmHg   Bicarbonate 26.2 (H) 20.0 - 24.0 mEq/L   TCO2 29 0 - 100 mmol/L   O2 Saturation 92.0 %   Acid-base deficit 4.0 (H) 0.0 - 2.0 mmol/L   Patient temperature 98.6 F    Collection site RADIAL, ALLEN'S TEST ACCEPTABLE    Drawn by RT    Sample type ARTERIAL    Comment NOTIFIED PHYSICIAN   Urinalysis, Routine w reflex microscopic (not at [facility])     Status: Abnormal   Collection Time: 04/14/15  3:29 AM  Result Value Ref Range   Color, Urine YELLOW YELLOW   APPearance  CLOUDY (A) CLEAR   Specific Gravity, Urine 1.020 1.005 - 1.030   pH 6.5 5.0 - 8.0   Glucose, UA NEGATIVE NEGATIVE mg/dL   Hgb urine dipstick LARGE (A) NEGATIVE   Bilirubin Urine NEGATIVE NEGATIVE   Ketones, ur 15 (A) NEGATIVE mg/dL   Protein, ur >300 (A) NEGATIVE mg/dL   Nitrite POSITIVE (A) NEGATIVE   Leukocytes, UA LARGE (A) NEGATIVE  Urine microscopic-add on     Status: Abnormal   Collection Time: 04/14/15  3:29 AM  Result Value Ref Range   Squamous Epithelial / LPF 6-30 (A) NONE SEEN   WBC, UA TOO NUMEROUS TO COUNT 0 - 5 WBC/hpf   RBC / HPF TOO NUMEROUS TO COUNT 0 - 5 RBC/hpf   Bacteria, UA MANY (A) NONE SEEN   Urine-Other YEAST PRESENT   Culture, blood (Routine X 2) w Reflex to ID Panel     Status: None (Preliminary result)   Collection Time: 04/14/15  4:11 AM  Result Value Ref Range   Specimen Description BLOOD LEFT ARM    Special Requests BOTTLES DRAWN AEROBIC ONLY 5ML    Culture PENDING    Report Status PENDING   Culture, blood (Routine X 2) w Reflex to ID Panel     Status: None (Preliminary result)   Collection Time:  04/14/15  4:16 AM  Result Value Ref Range   Specimen Description BLOOD LEFT HAND    Special Requests BOTTLES DRAWN AEROBIC ONLY 5ML    Culture PENDING    Report Status PENDING   I-Stat arterial blood gas, ED     Status: Abnormal   Collection Time: 04/14/15  5:07 AM  Result Value Ref Range   pH, Arterial 7.142 (LL) 7.350 - 7.450   pCO2 arterial 78.3 (HH) 35.0 - 45.0 mmHg   pO2, Arterial 103.0 (H) 80.0 - 100.0 mmHg   Bicarbonate 26.8 (H) 20.0 - 24.0 mEq/L   TCO2 29 0 - 100 mmol/L   O2 Saturation 95.0 %   Acid-base deficit 3.0 (H) 0.0 - 2.0 mmol/L   Patient temperature 98.6 F    Collection site RADIAL, ALLEN'S TEST ACCEPTABLE    Drawn by RT    Sample type ARTERIAL    Comment NOTIFIED PHYSICIAN   Comprehensive metabolic panel     Status: Abnormal   Collection Time: 04/14/15  5:19 AM  Result Value Ref Range   Sodium 143 135 - 145 mmol/L   Potassium 4.5 3.5 - 5.1 mmol/L   Chloride 103 101 - 111 mmol/L   CO2 29 22 - 32 mmol/L   Glucose, Bld 72 65 - 99 mg/dL   BUN 40 (H) 6 - 20 mg/dL   Creatinine, Ser 8.60 (H) 0.61 - 1.24 mg/dL   Calcium 8.6 (L) 8.9 - 10.3 mg/dL   Total Protein 7.0 6.5 - 8.1 g/dL   Albumin 2.0 (L) 3.5 - 5.0 g/dL   AST 12 (L) 15 - 41 U/L   ALT 9 (L) 17 - 63 U/L   Alkaline Phosphatase 40 38 - 126 U/L   Total Bilirubin 1.2 0.3 - 1.2 mg/dL   GFR calc non Af Amer 6 (L) >60 mL/min   GFR calc Af Amer 6 (L) >60 mL/min    Comment: (NOTE) The eGFR has been calculated using the CKD EPI equation. This calculation has not been validated in all clinical situations. eGFR's persistently <60 mL/min signify possible Chronic Kidney Disease.    Anion gap 11 5 - 15  CBC  Status: Abnormal   Collection Time: 04/14/15  6:15 AM  Result Value Ref Range   WBC 11.2 (H) 4.0 - 10.5 K/uL   RBC 3.67 (L) 4.22 - 5.81 MIL/uL   Hemoglobin 7.5 (L) 13.0 - 17.0 g/dL   HCT 26.4 (L) 39.0 - 52.0 %   MCV 71.9 (L) 78.0 - 100.0 fL   MCH 20.4 (L) 26.0 - 34.0 pg   MCHC 28.4 (L) 30.0 - 36.0 g/dL    RDW 20.4 (H) 11.5 - 15.5 %   Platelets 250 150 - 400 K/uL  Vancomycin, random     Status: Abnormal   Collection Time: 04/14/15  6:32 AM  Result Value Ref Range   Vancomycin Rm 147 (HH) ug/mL    Comment:        Random Vancomycin therapeutic range is dependent on dosage and time of specimen collection. A peak range is 20.0-40.0 ug/mL A trough range is 5.0-15.0 ug/mL        RESULTS CONFIRMED BY MANUAL DILUTION CRITICAL RESULT CALLED TO, READ BACK BY AND VERIFIED WITH: HANESERN [id number] MCCAULEG   CBC     Status: Abnormal   Collection Time: 04/14/15  9:35 AM  Result Value Ref Range   WBC 10.2 4.0 - 10.5 K/uL   RBC 3.56 (L) 4.22 - 5.81 MIL/uL   Hemoglobin 7.3 (L) 13.0 - 17.0 g/dL   HCT 25.6 (L) 39.0 - 52.0 %   MCV 71.9 (L) 78.0 - 100.0 fL   MCH 20.5 (L) 26.0 - 34.0 pg   MCHC 28.5 (L) 30.0 - 36.0 g/dL   RDW 20.4 (H) 11.5 - 15.5 %   Platelets 287 150 - 400 K/uL    Dg Chest 2 View  04/13/2015  CLINICAL DATA:  PICC line placement. EXAM: CHEST  2 VIEW COMPARISON:  March 02, 2015. FINDINGS: Stable cardiomediastinal silhouette. No pneumothorax or pleural effusion is noted. Atherosclerosis of thoracic aorta is noted. No acute pulmonary disease is noted. Right-sided PICC line is noted with distal tip in expected position of the SVC. Bony thorax is unremarkable. IMPRESSION: Right-sided PICC line is noted with distal tip in expected position of the SVC. Electronically Signed   By: Marijo Conception, M.D.   On: 04/13/2015 19:26   Dg Foot Complete Right  04/13/2015  CLINICAL DATA:  Open wound of second toe. EXAM: RIGHT FOOT COMPLETE - 3+ VIEW COMPARISON:  April 27, 2013. FINDINGS: Vascular calcifications are noted. Lytic destruction of the distal portion of the second proximal phalanx is noted consistent with osteomyelitis. No other acute bony abnormality is noted. No radiopaque foreign body is noted. IMPRESSION: Lytic destruction of the second proximal phalanx is noted consistent with acute  osteomyelitis. Electronically Signed   By: Marijo Conception, M.D.   On: 04/13/2015 19:47    ROS Blood pressure 175/65, pulse 73, temperature 97.6 F (36.4 C), temperature source Oral, resp. rate 24, height _0  (1.905 m), weight 72.576 kg (160 lb), SpO2 95 %. Physical Exam Physical Examination: General appearance - emaciated,  Mental status - Ox2, slow Eyes - pupils equal and reactive, extraocular eye movements intact, funduscopic exam normal, discs flat and sharp Neck - adenopathy noted PCL Lymphatics - posterior cervical nodes Chest - decreased air entry noted bilat, rhonchi noted bilat Heart - S1 and S2 normal, systolic murmur JY7/8 at apex Abdomen - pos bs, soft,  Extremities - trophic changes distally dressing L heel ulcer.  Dry gangrene 1st 3 toes on R , starting distally on  L toes, R PICC Skin - as above  Assessment/Plan: 1 AKI Vanc toxicity, mild low bps, ? Urine vs osteo sepsis.  Needs off Vanc.  Cannot r/o pyonehrosis..  Vol ok. Acid base and K ok.  Will treat conservatively . Poor candidate for invasive therapy.   2 Pyuria  ? infx vs Vanco 3 Hypertension: not an issue 4. Anemia ? etio 5. Osteomyelitis use zosyn, Rec Teflaro or Dapto 6 PVD 7 COPD 8 GERD 10 Severe FTT 11 afib controlled, NSR 12 Urinary retention P foley, limit fluids, U/S, Urine lytes, stop Vanco  Lameeka Schleifer L 04/14/2015, 11:05 AM

## 2015-04-14 NOTE — ED Notes (Signed)
Dr carter at bedside

## 2015-04-14 NOTE — Progress Notes (Signed)
Pharmacy Antibiotic Note  Elayne GuerinWilliam Sailer is a 70 y.o. male admitted on 04/13/2015 with osteomyelitis.  Pharmacy has been consulted for daptomycin dosing dosing. VR 147  Plan: -Will hold daptomycin dosing until vancomycin level comes down to sub therapeutic range due to VR of 147 -Will obtain VR level on 04/03 AM -Continue Zosyn 2.25gIV q8h -Monitor for improvements in renal function  Height: 6\' 3"  (190.5 cm) Weight: 160 lb (72.576 kg) IBW/kg (Calculated) : 84.5  Temp (24hrs), Avg:97.6 F (36.4 C), Min:97.5 F (36.4 C), Max:97.6 F (36.4 C)   Recent Labs Lab 04/13/15 2115 04/13/15 2144 04/13/15 2154 04/14/15 0057 04/14/15 0519 04/14/15 0615 04/14/15 0632 04/14/15 0935  WBC 12.6*  --   --   --   --  11.2*  --  10.2  CREATININE 8.44*  --   --   --  8.60*  --   --   --   LATICACIDVEN  --  0.93 1.04 0.40*  --   --   --   --   VANCORANDOM  --   --   --   --   --   --  147*  --     Estimated Creatinine Clearance: 8.4 mL/min (by C-G formula based on Cr of 8.6).    No Known Allergies  Antimicrobials this admission: Vanc pta > 04/01 Zosyn pta >  Dose adjustments this admission: Vancomycin discontinued   Microbiology results: 4/1 blood cx: 4/1 urine cx:  Thank you for allowing pharmacy to be a part of this patient's care.  Sandi CarneNick Stirling Orton, PharmD Pharmacy Resident Pager: [phone] 04/14/2015 1:08 PM

## 2015-04-14 NOTE — ED Notes (Signed)
RT made aware of current order for ABG

## 2015-04-14 NOTE — Progress Notes (Signed)
Initial Nutrition Assessment  DOCUMENTATION CODES:  Severe malnutrition in context of chronic illness   Pt meets criteria for SEVERE MALNUTRITION in the context of CHRONIC ILLNESS as evidenced by Severe Muscle Wasting and an estimated energy intake that met < or equal to 75% of needs for > or equal to 1 month.  INTERVENTION:  If comfort measures are not pursued, recommend advancement of diet so nourishment can be started to promote wound healing/maintenance of remaining lean body mass  Once Diet is advanced  30 mL Prostat TID, each supplement provides 100 kcal and 15 grams of protein.  Ensure Enlive po BID, each supplement provides 350 kcal and 20 grams of protein  Recommend a Renal appropriate MVI with MD dicretion.   Pending renal function improvement, Vitamin C and Zinc supplementation is warranted  NUTRITION DIAGNOSIS:  Increased nutrient needs related to wound healing as evidenced by the recommended nutritional requirements for this condition  GOAL:  Patient will meet greater than or equal to 90% of their needs  MONITOR:  PO intake, Supplement acceptance, Labs, Diet advancement, Direction of care  REASON FOR ASSESSMENT:  Consult Wound healing  ASSESSMENT:  70 y/o male PMHx COPD, A fib, PVF, Malnutrition, alcoholic cirrhosis, depression, CAD, hep B, MI, chronic wounds, long-term snf resident who presents for surgery for gangrenous toe-unable to pursue surgery at this time. Workup also shows severe AKI, potential sepsis secondary to osteomyelitis. Anticipate transition to Comfort measures in <24 hours if does not respond to therapy.   Pt reports that he has absolutely no appetite. He says he hasnt one in a very long time. RD asked if pt experiences any side effects when eating without an appetite. Pt denies any symptoms hindering his intake. He can eat whatever he wants and is able to tolerate it, but he just doesn't desire it. He took Vitamin D and Vitamin C at his SNF.   RD  explained importance of nutrition in disease and especially for wound healing. He said that he would TRY to eat if given food, though he does not feel like eating. He was agreeable to milkshakes (Ensure)  Pt was very difficult to comprehend. Also, unsure how reliable his answers are. Pt stated to MD that entered room before RD that he did not walk. However, when RD asked when the last time he walked was he reports he can walk right now.    NFPE: Lower body completely devoid of lean body mass. Upper body, fairly nourished, mild to moderate fat/muscle wasting appreciated.   Labs reviewed: Albumin: 2, Anemic    Recent Labs Lab 04/13/15 2115 04/14/15 0519 04/14/15 0935  NA 141 143  --   K 4.3 4.5  --   CL 103 103  --   CO2 25 29  --   BUN 40* 40*  --   CREATININE 8.44* 8.60*  --   CALCIUM 9.0 8.6*  --   PHOS  --   --  8.0*  GLUCOSE 74 72  --    Diet Order:  Diet NPO time specified  Skin:  Dry, gangrenous toe  Last BM:  Colostomy-300 ml   Height:  Ht Readings from Last 1 Encounters:  04/13/15 6' 3"  (1.905 m)   Weight:  Wt Readings from Last 1 Encounters:  04/13/15 160 lb (72.576 kg)   Wt Readings from Last 10 Encounters:  04/13/15 160 lb (72.576 kg)  04/09/15 158 lb (71.668 kg)  04/03/15 158 lb (71.668 kg)  03/30/15 152 lb (68.947  kg)  03/13/15 152 lb 6.4 oz (69.128 kg)  03/08/15 152 lb 4 oz (69.06 kg)  03/01/15 152 lb 5.4 oz (69.1 kg)  03/01/15 157 lb (71.215 kg)  02/26/15 157 lb (71.215 kg)  02/22/15 157 lb (71.215 kg)   Ideal Body Weight:  89.1 kg  BMI:  Body mass index is 20 kg/(m^2).  Estimated Nutritional Needs:  Kcal:  2100-2300 (29-32 kcal/kg bw) Protein:  109-130 g (1.5-1.8 g/kg bw) Fluid:  PER MD (pt currently anuric)  EDUCATION NEEDS:  No education needs identified at this time  Burtis Junes RD, LDN Clinical Nutrition Pager: [phone] 04/14/2015 2:37 PM

## 2015-04-14 NOTE — ED Notes (Signed)
Dr. Carter at bedside.

## 2015-04-14 NOTE — Consult Note (Signed)
ORTHOPAEDIC CONSULTATION  REQUESTING PHYSICIAN: Michael LitterNikki Carter, MD  Chief Complaint: Gangrene right foot second toe  HPI: Craig GuerinWilliam [name] is a 70 y.o. 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 COPD (chronic obstructive pulmonary disease) (HCC)   . Hypertension   . Myocardial infarction (HCC)   . A-fib (HCC)   . GERD (gastroesophageal reflux disease)   . Hepatitis B 04/27/2013  . Dysphagia 04/27/2013  . Diverticulosis 03/05/2013  . Sepsis (HCC) 04/26/2013  . Alcoholic cirrhosis of liver without ascites (HCC)   . Coronary artery disease   . Thyroid disease   . Depression   . Urinary retention    Past Surgical History  Procedure Laterality Date  . Colostomy    . Peg tube placement    . Peg tube removal     Social History   Social History  . Marital Status: Unknown    Spouse Name: N/A  . Number of Children: N/A  . Years of Education: N/A   Social History Main Topics  . Smoking status: Former Games developermoker  . Smokeless tobacco: None  . Alcohol Use: No  . Drug Use: No  . Sexual Activity: Not Currently   Other Topics Concern  . None   Social History Narrative   No family history on file. - negative except otherwise stated in the family history section No Known Allergies Prior to Admission medications   Medication Sig Start Date End Date Taking? Authorizing Provider  Amino Acids-Protein Hydrolys (FEEDING SUPPLEMENT, PRO-STAT SUGAR FREE 64,) LIQD Take 60 mLs by mouth 2 (two) times daily. 9am, 6pm   Yes Historical Provider, MD  amiodarone (PACERONE) 200 MG tablet Take 200 mg by mouth daily.   Yes Historical Provider, MD  aspirin EC 81 MG tablet Take 81 mg by mouth daily.   Yes Historical Provider, MD  cetirizine (ZYRTEC) 10 MG tablet Take 10 mg by mouth daily as needed  for allergies.    Yes Historical Provider, MD  escitalopram (LEXAPRO) 10 MG tablet Take 10 mg by mouth daily.   Yes Historical Provider, MD  Fluticasone Furoate-Vilanterol (BREO ELLIPTA) 100-25 MCG/INH AEPB Inhale 1 puff into the lungs daily.    Yes Historical Provider, MD  folic acid (FOLVITE) 1 MG tablet Take 1 mg by mouth daily.   Yes Historical Provider, MD  hydrOXYzine (ATARAX/VISTARIL) 25 MG tablet Take 25 mg by mouth every 6 (six) hours as needed for itching.   Yes Historical Provider, MD  Iron Polysacch Cmplx-B12-FA 150-0.025-1 MG CAPS Take 1 capsule by mouth daily.   Yes Historical Provider, MD  K Phos Mono-Sod Phos Di & Mono (PHOSPHA 250 NEUTRAL PO) Take 250 mg by mouth 2 (two) times daily. 9am, 6pm   Yes Historical Provider, MD  lamoTRIgine (LAMICTAL) 25 MG tablet Take 50 mg by mouth daily.    Yes Historical Provider, MD  levothyroxine (SYNTHROID, LEVOTHROID) 125 MCG tablet Take 125 mcg by mouth daily before breakfast.   Yes Historical Provider, MD  Melatonin 3 MG TABS Take 3 mg by mouth at bedtime.    Yes Historical Provider, MD  morphine (ROXANOL) 20 MG/ML concentrated solution Take 4 mg by mouth every 2 (two) hours as needed for severe pain.   Yes Historical Provider, MD  Multiple Vitamins-Minerals (DECUBI-VITE PO) Take 2 capsules by mouth daily.  Yes Historical Provider, MD  Nutritional Supplements (NUTRITIONAL SUPPLEMENT PO) Take by mouth 2 (two) times daily with a meal. House Supplement   Yes Historical Provider, MD  Nutritional Supplements (NUTRITIONAL SUPPLEMENT PO) Take 90 mLs by mouth 3 (three) times daily. House 2.0 supplement   Yes Historical Provider, MD  omeprazole (PRILOSEC) 20 MG capsule Take 20 mg by mouth daily before breakfast.    Yes Historical Provider, MD  ondansetron (ZOFRAN) 4 MG tablet Take 4 mg by mouth every 6 (six) hours as needed for nausea or vomiting.   Yes Historical Provider, MD  OXYGEN Inhale into the lungs continuous. 2 L/min  Maintain Sp02 >92%   Yes  Historical Provider, MD  piperacillin-tazobactam (ZOSYN) 3.375 GM/50ML IVPB Inject 3.375 g into the vein every 6 (six) hours. 6 week course started 03/30/15 (end date 05/11/15)   Yes Historical Provider, MD  polyethylene glycol (MIRALAX / GLYCOLAX) packet Take 17 g by mouth daily. Mix in 8 oz liquid and drink   Yes Historical Provider, MD  Povidone-Iodine (BETADINE EX) Apply 1 application topically daily. Apply to right 2nd toe abscess area daily   Yes Historical Provider, MD  promethazine (PHENERGAN) 25 MG tablet Take 25 mg by mouth every 6 (six) hours as needed for nausea or vomiting.   Yes Historical Provider, MD  thiamine 100 MG tablet Take 100 mg by mouth daily.   Yes Historical Provider, MD  vancomycin in sodium chloride 0.9 % 250 mL Inject 1,250 mg into the vein every 12 (twelve) hours. Start dated 04/03/15   Yes Historical Provider, MD  vitamin C (ASCORBIC ACID) 500 MG tablet Take 500 mg by mouth 2 (two) times daily. 9am, 6pm   Yes Historical Provider, MD   Dg Chest 2 View  04/13/2015  CLINICAL DATA:  PICC line placement. EXAM: CHEST  2 VIEW COMPARISON:  March 02, 2015. FINDINGS: Stable cardiomediastinal silhouette. No pneumothorax or pleural effusion is noted. Atherosclerosis of thoracic aorta is noted. No acute pulmonary disease is noted. Right-sided PICC line is noted with distal tip in expected position of the SVC. Bony thorax is unremarkable. IMPRESSION: Right-sided PICC line is noted with distal tip in expected position of the SVC. Electronically Signed   By: Lupita Raider, M.D.   On: 04/13/2015 19:26   Dg Foot Complete Right  04/13/2015  CLINICAL DATA:  Open wound of second toe. EXAM: RIGHT FOOT COMPLETE - 3+ VIEW COMPARISON:  April 27, 2013. FINDINGS: Vascular calcifications are noted. Lytic destruction of the distal portion of the second proximal phalanx is noted consistent with osteomyelitis. No other acute bony abnormality is noted. No radiopaque foreign body is noted. IMPRESSION:  Lytic destruction of the second proximal phalanx is noted consistent with acute osteomyelitis. Electronically Signed   By: Lupita Raider, M.D.   On: 04/13/2015 19:47   - pertinent xrays, CT, MRI studies were reviewed and independently interpreted  Positive ROS: All other systems have been reviewed and were otherwise negative with the exception of those mentioned in the HPI and as above.  Physical Exam: General: Alert, no acute distress Cardiovascular: No pedal edema Respiratory: No cyanosis, no use of accessory musculature GI: No organomegaly, abdomen is soft and non-tender Skin: Thin atrophic skin for both lower extremities with pain and swelling of the feet with dry gangrene involving the right foot second toe. Neurologic: Patient does not have protective sensation Psychiatric: Patient is competent for consent with normal mood and affect Lymphatic: No axillary or cervical lymphadenopathy  MUSCULOSKELETAL:  On examination he does not have a palpable dorsalis pedis or posterior tibial pulse bilaterally. His lower extremities are thin cachectic with atrophic changes to both feet with dry gangrenous changes to the second toe right foot with radiographs showing destructive osteomyelitis.  Assessment: Assessment: Severe protein caloric malnutrition with severe peripheral vascular disease end stage renal disease with plan atrophic bilateral lower extremities with dry gangrenous changes and ostium myelitis right foot second toe  Plan: Plan: Feel it safe to watch the patient's feet without surgical intervention. For patient to heal any type of surgery he would most likely require an above-the-knee amputation. There is no ascending cellulitis no purulence no drainageis no sign of systemic infection from the dry gangrenous changes to the bilateral lower extremities.  Thank you for the consult and the opportunity to see Mr. Harbor Vanover, MD [facility] [phone] 2:05  PM

## 2015-04-14 NOTE — Progress Notes (Signed)
[facility] PROGRESS NOTE  [name] FAO:[id number] DOB: Jan 03, 1946 DOA: 04/13/2015 PCP: Kirt Boys, DO  Assessment/Plan: 1. Sepsis -Present on admission, evidenced by acute encephalopathy, white count of 12,600, acute kidney injury with creatinine of 8.4 with source of infection likely secondary to osteomyelitis involving second proximal phalanx of right foot. -In the outpatient setting amputation had been recommended however patient refused this intervention. -He had been treated with IV vancomycin and Zosyn through PICC line. -Nephrology consulted regarding renal failure recommended discontinuation of IV vancomycin suspecting vancomycin-induced nephrotoxicity.  -Will place him on daptomycin. -Continue IV fluids, supportive care, follow-up on blood cultures and urine cultures.  2.  Acute hypoxemic hypercarbic respiratory failure -Evidenced by a pH of 7.14 with PCO2 of 78, presenting with respiratory distress and acute encephalopathy. -Chest x-ray performed in the emergency department did not reveal acute infiltrate. -Suspect respiratory failure secondary to sepsis -He is a DNR, admitted to stepdown unit currently on BiPAP  3. Acute kidney injury. -Lab work revealing a creatinine of 8.6 on admission, increased from 0.9 from labs drawn on 03/14/2015. -Acute kidney injury could be related to multiple factors including sepsis/critical illness, ATN, hypotension, vancomycin-induced nephrotoxicity -Nephrology has been consulted. Pending bilateral renal ultrasound. IV vancomycin discontinued  -Patient would like to be poor candidate for renal replacement therapy  4.  Acute encephalopathy. -Craig [name] at baseline having a poor functional status, resident at SNF, having multiple comorbidities presenting with mental status changes -I suspect acute encephalopathy related to multiple factors including sepsis/infection, acute renal failure, hypoxemic hypercarbic respiratory failure,  dehydration.  -He is critically ill, will be admitted to the step down unit, supportive care, IV fluids, IV antibiotic therapy, BiPAP  5.  Acute osteomyelitis. -Craig. [name] having history of ulceration involving second toe of right foot, had been seen recently in the office by vascular surgery at which time they recommended hospitalization for amputation. Patient at that time refused hospitalization and was treated with IV antibiotics. -X-ray performed in the emergency department showing lytic destruction of the second proximal phalanx consistent with acute osteomyelitis. -Case was discussed with Dr.Duda of orthopedic surgery. Will need to determine patient's medical goals of care as far as pursuing amputation.  6.  Urinary tract infection. -Urinalysis showed the presence of many bacteria, nitrates and leukocytes. -He had been on broad-spectrum IV antibiotic coverage with Zosyn and IV vancomycin. -Now on Daptomycin. Urine cultures pending  7.  Chronic obstructive pulmonary disease -He presented with hypoxemic hypercarbic respiratory failure and was started on systemic steroids in the lungs or treatments.  8.  Severe protein calorie malnutrition -Patient having a a pre-albumin level of 7.0. At baseline has a poor functional status  9.  Medical goals of care. -Overall anything his prognosis is poor. At baseline he has poor functional status, wheelchair bound, having multiple medical conditions including severe peripheral vascular disease. He is presenting with sepsis, acute osteomyelitis involving second digit of right foot. Toe amputation has been recommended in the past by his surgeons however at that time he has refused to undergo this procedure. I spoke to Craig Rebekah Sprinkle who is his caregiver. Craig. Effie Shy states that Craig. Malerba has not had Medication with family members about 20 years. He has a sister that lives in [place], and he has taken over caregiving responsibilities. He  confirms patient's DO NOT RESUSCITATE status. With regard to amputation, he would like to see if he can speak with Craig. Silbernagel about this. Explained that his prognosis was poor and that  he was likely to continue declining on medical management alone.  Code Status: DO NOT RESUSCITATE Family Communication:  Disposition Plan: Admit to SDU   Consultants:  Nephrology  Orthopedic surgery   Antibiotics:  Daptomycin  HPI/Subjective: Craig [name] is a 70 year old gentleman with past medical history of peripheral vascular disease, chronic right toe ulcer, nonambulatory at baseline having poor functional status, severe protein malnutrition, who was seen by Dr Imogene Burn of vascular surgery on 03/30/2015. He was found to have frank pus draining from right second toe as it was recommended to him then that he be admitted to the hospital and evaluated by orthopedic surgery for amputation. It appears that the time he refused to go to the emergency department. He has been treated with IV vancomycin and Zosyn in the outpatient setting. Overnight he presented to the emergency department with acute encephalopathy. X-rays of his right foot showed lytic destruction of the second proximal phalanx consistent with acute osteomyelitis. An ABG revealed a pH of 7.198 with PCO2 of 63 and PaO2 of 73. Last revealed elevated creatinine of 8.6, previously 0.91 03/14/2015. Orthopedic surgery and nephrology were consulted.  Objective: Filed Vitals:   04/14/15 1134 04/14/15 1200  BP: 118/93 157/67  Pulse: 69 66  Temp: 97.5 F (36.4 C)   Resp: 18 16    Intake/Output Summary (Last 24 hours) at 04/14/15 1334 Last data filed at 04/14/15 1100  Gross per 24 hour  Intake   1060 ml  Output    510 ml  Net    550 ml   Filed Weights   04/13/15 1732  Weight: 72.576 kg (160 lb)    Exam:   General:  He is ill-appearing, cachectic, wasted, on BiPAP, arousable to voice  Cardiovascular: Tachycardic regular rate rhythm normal  S1-S2  Respiratory: Normal respiratory effort, diminished breath sounds bilaterally having bibasilar crackles  Abdomen: Status post colostomy placement  Musculoskeletal: Severe muscle atrophy  Data Reviewed: Basic Metabolic Panel:  Recent Labs Lab 04/13/15 2115 04/14/15 0519 04/14/15 0935  NA 141 143  --   K 4.3 4.5  --   CL 103 103  --   CO2 25 29  --   GLUCOSE 74 72  --   BUN 40* 40*  --   CREATININE 8.44* 8.60*  --   CALCIUM 9.0 8.6*  --   PHOS  --   --  8.0*   Liver Function Tests:  Recent Labs Lab 04/13/15 2115 04/14/15 0519  AST 11* 12*  ALT 9* 9*  ALKPHOS 47 40  BILITOT 1.3* 1.2  PROT 8.0 7.0  ALBUMIN 2.3* 2.0*   No results for input(s): LIPASE, AMYLASE in the last 168 hours. No results for input(s): AMMONIA in the last 168 hours. CBC:  Recent Labs Lab 04/13/15 2115 04/14/15 0615 04/14/15 0935  WBC 12.6* 11.2* 10.2  NEUTROABS 9.3*  --  9.3*  HGB 8.5* 7.5* 7.3*  HCT 27.2* 26.4* 25.6*  MCV 71.6* 71.9* 71.9*  PLT 309 250 287   Cardiac Enzymes:  Recent Labs Lab 04/14/15 0935  TROPONINI 0.03   BNP (last 3 results) No results for input(s): BNP in the last 8760 hours.  ProBNP (last 3 results) No results for input(s): PROBNP in the last 8760 hours.  CBG: No results for input(s): GLUCAP in the last 168 hours.  Recent Results (from the past 240 hour(s))  Culture, blood (Routine X 2) w Reflex to ID Panel     Status: None (Preliminary result)   Collection Time:  04/14/15  4:11 AM  Result Value Ref Range Status   Specimen Description BLOOD LEFT ARM  Final   Special Requests BOTTLES DRAWN AEROBIC ONLY 5ML  Final   Culture PENDING  Incomplete   Report Status PENDING  Incomplete  Culture, blood (Routine X 2) w Reflex to ID Panel     Status: None (Preliminary result)   Collection Time: 04/14/15  4:16 AM  Result Value Ref Range Status   Specimen Description BLOOD LEFT HAND  Final   Special Requests BOTTLES DRAWN AEROBIC ONLY 5ML  Final   Culture  PENDING  Incomplete   Report Status PENDING  Incomplete  MRSA PCR Screening     Status: Abnormal   Collection Time: 04/14/15 10:03 AM  Result Value Ref Range Status   MRSA by PCR POSITIVE (A) NEGATIVE Final    Comment:        The GeneXpert MRSA Assay (FDA approved for NASAL specimens only), is one component of a comprehensive MRSA colonization surveillance program. It is not intended to diagnose MRSA infection nor to guide or monitor treatment for MRSA infections. RESULT CALLED TO, READ BACK BY AND VERIFIED WITH: RN Sherlean FootJ CLEAVER AT 1220 [id number] MARTINB      Studies: Dg Chest 2 View  04/13/2015  CLINICAL DATA:  PICC line placement. EXAM: CHEST  2 VIEW COMPARISON:  March 02, 2015. FINDINGS: Stable cardiomediastinal silhouette. No pneumothorax or pleural effusion is noted. Atherosclerosis of thoracic aorta is noted. No acute pulmonary disease is noted. Right-sided PICC line is noted with distal tip in expected position of the SVC. Bony thorax is unremarkable. IMPRESSION: Right-sided PICC line is noted with distal tip in expected position of the SVC. Electronically Signed   By: Lupita RaiderJames  Green Jr, M.D.   On: 04/13/2015 19:26   Dg Foot Complete Right  04/13/2015  CLINICAL DATA:  Open wound of second toe. EXAM: RIGHT FOOT COMPLETE - 3+ VIEW COMPARISON:  April 27, 2013. FINDINGS: Vascular calcifications are noted. Lytic destruction of the distal portion of the second proximal phalanx is noted consistent with osteomyelitis. No other acute bony abnormality is noted. No radiopaque foreign body is noted. IMPRESSION: Lytic destruction of the second proximal phalanx is noted consistent with acute osteomyelitis. Electronically Signed   By: Lupita RaiderJames  Green Jr, M.D.   On: 04/13/2015 19:47    Scheduled Meds: . amiodarone  200 mg Oral Daily  . aspirin EC  81 mg Oral Daily  . escitalopram  10 mg Oral Daily  . fluticasone furoate-vilanterol  1 puff Inhalation Daily  . folic acid  1 mg Oral Daily  . heparin   5,000 Units Subcutaneous 3 times per day  . ipratropium-albuterol  3 mL Nebulization Q4H  . lamoTRIgine  50 mg Oral Daily  . levothyroxine  125 mcg Oral QAC breakfast  . methylPREDNISolone (SOLU-MEDROL) injection  80 mg Intravenous Q6H  . multivitamin with minerals  1 tablet Oral Daily  . pantoprazole  40 mg Oral Daily  . sodium chloride flush  3 mL Intravenous Q12H  . thiamine  100 mg Oral Daily  . vitamin C  500 mg Oral BID   Continuous Infusions: . sodium chloride 10 mL/hr at 04/14/15 1050  . sodium chloride 50 mL/hr at 04/14/15 1050    Principal Problem:   Sepsis (HCC) Active Problems:   AKI (acute kidney injury) (HCC)   Osteomyelitis (HCC)   UTI (urinary tract infection)   Protein-calorie malnutrition, severe (HCC)   Failure to thrive in adult  Abscess of second toe of right foot    Time spent:     Jeralyn Bennett  [facility] Pager [phone]. If 7PM-7AM, please contact night-coverage at [email or web address], password [facility] 04/14/2015, 1:34 PM  LOS: 1 day

## 2015-04-14 NOTE — Progress Notes (Signed)
Patient on 2 LPM nasal cannula, BIPAP in room on stand by. Patient in no distress, vitals stable. RT will continue to monitor.

## 2015-04-15 ENCOUNTER — Inpatient Hospital Stay (HOSPITAL_COMMUNITY): Payer: Medicare Other

## 2015-04-15 ENCOUNTER — Encounter (HOSPITAL_COMMUNITY): Payer: Medicare Other

## 2015-04-15 DIAGNOSIS — Z515 Encounter for palliative care: Secondary | ICD-10-CM

## 2015-04-15 DIAGNOSIS — N3000 Acute cystitis without hematuria: Secondary | ICD-10-CM

## 2015-04-15 LAB — COMPREHENSIVE METABOLIC PANEL
ALBUMIN: 2 g/dL — AB (ref 3.5–5.0)
ALK PHOS: 35 U/L — AB (ref 38–126)
ALT: 7 U/L — ABNORMAL LOW (ref 17–63)
AST: 10 U/L — AB (ref 15–41)
Anion gap: 14 (ref 5–15)
BILIRUBIN TOTAL: 1.1 mg/dL (ref 0.3–1.2)
BUN: 60 mg/dL — AB (ref 6–20)
CO2: 26 mmol/L (ref 22–32)
Calcium: 8.5 mg/dL — ABNORMAL LOW (ref 8.9–10.3)
Chloride: 102 mmol/L (ref 101–111)
Creatinine, Ser: 9.74 mg/dL — ABNORMAL HIGH (ref 0.61–1.24)
GFR calc Af Amer: 6 mL/min — ABNORMAL LOW (ref >60)
GFR calc non Af Amer: 5 mL/min — ABNORMAL LOW (ref >60)
GLUCOSE: 119 mg/dL — AB (ref 65–99)
POTASSIUM: 3.9 mmol/L (ref 3.5–5.1)
Sodium: 142 mmol/L (ref 135–145)
TOTAL PROTEIN: 7 g/dL (ref 6.5–8.1)

## 2015-04-15 LAB — CBC
HEMATOCRIT: 21.3 % — AB (ref 39.0–52.0)
HEMOGLOBIN: 6.4 g/dL — AB (ref 13.0–17.0)
MCH: 20.8 pg — ABNORMAL LOW (ref 26.0–34.0)
MCHC: 30 g/dL (ref 30.0–36.0)
MCV: 69.2 fL — ABNORMAL LOW (ref 78.0–100.0)
Platelets: 261 10*3/uL (ref 150–400)
RBC: 3.08 MIL/uL — ABNORMAL LOW (ref 4.22–5.81)
RDW: 19.9 % — AB (ref 11.5–15.5)
WBC: 8.5 10*3/uL (ref 4.0–10.5)

## 2015-04-15 LAB — ABO/RH: ABO/RH(D): O POS

## 2015-04-15 LAB — PREPARE RBC (CROSSMATCH)

## 2015-04-15 MED ORDER — TECHNETIUM TO 99M ALBUMIN AGGREGATED
4.3800 | Freq: Once | INTRAVENOUS | Status: AC | PRN
  Administered 2015-04-15: 4 via INTRAVENOUS

## 2015-04-15 MED ORDER — SODIUM CHLORIDE 0.9 % IV SOLN
Freq: Once | INTRAVENOUS | Status: AC
  Administered 2015-04-15: 15:00:00 via INTRAVENOUS

## 2015-04-15 MED ORDER — SODIUM CHLORIDE 0.9% FLUSH
10.0000 mL | INTRAVENOUS | Status: DC | PRN
  Administered 2015-04-15: 10 mL
  Administered 2015-04-15: 20 mL
  Administered 2015-04-18 – 2015-04-20 (×4): 10 mL
  Administered 2015-04-21: 20 mL
  Administered 2015-04-22: 10 mL
  Administered 2015-04-23: 20 mL
  Administered 2015-04-23 – 2015-04-27 (×3): 10 mL
  Filled 2015-04-15 (×13): qty 40

## 2015-04-15 MED ORDER — ALTEPLASE 2 MG IJ SOLR
2.0000 mg | Freq: Once | INTRAMUSCULAR | Status: AC
  Administered 2015-04-15: 2 mg
  Filled 2015-04-15: qty 2

## 2015-04-15 MED ORDER — METHYLPREDNISOLONE SODIUM SUCC 40 MG IJ SOLR
40.0000 mg | Freq: Four times a day (QID) | INTRAMUSCULAR | Status: DC
  Administered 2015-04-15 – 2015-04-16 (×4): 40 mg via INTRAVENOUS
  Filled 2015-04-15 (×4): qty 1

## 2015-04-15 MED ORDER — TECHNETIUM TC 99M DIETHYLENETRIAME-PENTAACETIC ACID: 30.8000 | Freq: Once | INTRAVENOUS | Status: DC | PRN

## 2015-04-15 MED ORDER — PIPERACILLIN-TAZOBACTAM IN DEX 2-0.25 GM/50ML IV SOLN
2.2500 g | Freq: Three times a day (TID) | INTRAVENOUS | Status: DC
  Administered 2015-04-15 – 2015-05-03 (×52): 2.25 g via INTRAVENOUS
  Filled 2015-04-15 (×58): qty 50

## 2015-04-15 MED ORDER — IPRATROPIUM-ALBUTEROL 0.5-2.5 (3) MG/3ML IN SOLN
3.0000 mL | Freq: Three times a day (TID) | RESPIRATORY_TRACT | Status: DC
  Administered 2015-04-15 – 2015-04-16 (×2): 3 mL via RESPIRATORY_TRACT
  Filled 2015-04-15 (×2): qty 3

## 2015-04-15 NOTE — Progress Notes (Signed)
Patient refusing to let me change central line dressing at this time. Double lumen PICC is flushing, but is not having blood return from in either lumen. IV consult in place at this time. Will continue to monitor patient. Milon DikesKristina D Daysen Gundrum, RN

## 2015-04-15 NOTE — Progress Notes (Signed)
Patient refused morning medication, as well as Heparin injection. Patient educated and refused. Will continue to monitor. Milon DikesKristina D Arrielle Mcginn, RN

## 2015-04-15 NOTE — Progress Notes (Signed)
Patient ID: Craig [name], male   DOB: 1945-03-06, 70 y.o.   MRN: [id number]    Facility: [facility]       No Known Allergies  Chief Complaint  Patient presents with  . Acute Visit    patient status     HPI:  His status is declining he is more confused. He normally stays in bed with his head covered. He is now trying to get out of bed. He is not eating or drinking. He tells me that he knows he is getting more sick. We had a very long discussion about his condition and he has agreed to go to the hospital.    Past Medical History  Diagnosis Date  . COPD (chronic obstructive pulmonary disease) ([place])   . Hypertension   . Myocardial infarction ([place])   . A-fib ([place])   . GERD (gastroesophageal reflux disease)   . Hepatitis B 04/27/2013  . Dysphagia 04/27/2013  . Diverticulosis 03/05/2013  . Sepsis ([place]) 04/26/2013  . Alcoholic cirrhosis of liver without ascites ([place])   . Coronary artery disease   . Thyroid disease   . Depression   . Urinary retention     Past Surgical History  Procedure Laterality Date  . Colostomy    . Peg tube placement    . Peg tube removal      VITAL SIGNS BP 130/76 mmHg  Pulse 78  Ht _0  (1.753 m)  Wt 158 lb (71.668 kg)  BMI 23.32 kg/m2  SpO2 94%  Patient's Medications  New Prescriptions   No medications on file  Previous Medications   AMINO ACIDS-PROTEIN HYDROLYS (FEEDING SUPPLEMENT, PRO-STAT SUGAR FREE 64,) LIQD    Take 60 mLs by mouth 2 (two) times daily. 9am, 6pm   AMIODARONE (PACERONE) 200 MG TABLET    Take 200 mg by mouth daily.   ASPIRIN EC 81 MG TABLET    Take 81 mg by mouth daily.   CETIRIZINE (ZYRTEC) 10 MG TABLET    Take 10 mg by mouth daily as needed for allergies.    ESCITALOPRAM (LEXAPRO) 10 MG TABLET    Take 10 mg by mouth daily.   FLUTICASONE FUROATE-VILANTEROL (BREO ELLIPTA) 100-25 MCG/INH AEPB    Inhale 1 puff into the lungs daily.    FOLIC ACID (FOLVITE) 1 MG TABLET    Take 1 mg by mouth daily.   HYDROXYZINE  (ATARAX/VISTARIL) 25 MG TABLET    Take 25 mg by mouth every 6 (six) hours as needed for itching.   IRON POLYSACCH CMPLX-B12-FA 150-0.025-1 MG CAPS    Take 1 capsule by mouth daily.   K PHOS MONO-SOD PHOS DI & MONO (PHOSPHA 250 NEUTRAL PO)    Take 250 mg by mouth 2 (two) times daily. 9am, 6pm   LAMOTRIGINE (LAMICTAL) 25 MG TABLET    Take 50 mg by mouth daily.    LEVOTHYROXINE (SYNTHROID, LEVOTHROID) 125 MCG TABLET    Take 125 mcg by mouth daily before breakfast.   MELATONIN 3 MG TABS    Take 3 mg by mouth at bedtime.    MORPHINE (ROXANOL) 20 MG/ML CONCENTRATED SOLUTION    Take 4 mg by mouth every 2 (two) hours as needed for severe pain.   MULTIPLE VITAMINS-MINERALS (DECUBI-VITE PO)    Take 2 capsules by mouth daily.    NUTRITIONAL SUPPLEMENTS (NUTRITIONAL SUPPLEMENT PO)    Take by mouth 2 (two) times daily with a meal. House Supplement   NUTRITIONAL SUPPLEMENTS (NUTRITIONAL SUPPLEMENT PO)  Take 90 mLs by mouth 3 (three) times daily. House 2.0 supplement   OMEPRAZOLE (PRILOSEC) 20 MG CAPSULE    Take 20 mg by mouth daily before breakfast.    ONDANSETRON (ZOFRAN) 4 MG TABLET    Take 4 mg by mouth every 6 (six) hours as needed for nausea or vomiting.   OXYGEN    Inhale into the lungs continuous. 2 L/min  Maintain Sp02 >92%   PIPERACILLIN-TAZOBACTAM (ZOSYN) 3.375 GM/50ML IVPB    Inject 3.375 g into the vein every 6 (six) hours. 6 week course started 03/30/15 (end date 05/11/15)   POLYETHYLENE GLYCOL (MIRALAX / GLYCOLAX) PACKET    Take 17 g by mouth daily. Mix in 8 oz liquid and drink   POVIDONE-IODINE (BETADINE EX)    Apply 1 application topically daily. Apply to right 2nd toe abscess area daily   PROMETHAZINE (PHENERGAN) 25 MG TABLET    Take 25 mg by mouth every 6 (six) hours as needed for nausea or vomiting.   THIAMINE 100 MG TABLET    Take 100 mg by mouth daily.   VANCOMYCIN IN SODIUM CHLORIDE 0.9 % 250 ML    Inject 1,250 mg into the vein every 12 (twelve) hours. Start dated 04/03/15   VITAMIN C  (ASCORBIC ACID) 500 MG TABLET    Take 500 mg by mouth 2 (two) times daily. 9am, 6pm  Modified Medications   No medications on file  Discontinued Medications   No medications on file     SIGNIFICANT DIAGNOSTIC EXAMS   12-16-13: chest x-ray; right lower lobe atelectasis  08-26-14: s/p foley placement: Placement of 12 French suprapubic bladder drainage catheter. Urine return was cloudy and a sample of urine was sent for urinalysis and culture.  10-20-14: kub: non-specific gas pattern.   10-21-14: ct of abdomen and pelvis: 1. Focal collection of fluid and trace air at the anterior left mid abdominal wall, measuring 8.1 x 2.1 x 5.3 cm, with peripheral enhancement, compatible with an abscess. This appears to be at the prior site of a G-tube placement, as the stomach is tamped up to the abdominal wall underlying the abscess. This may still be contiguous with the stomach. 2. Prominent bronchiectasis at the lower lung lobes, with patchy bibasilar airspace opacities. This may reflect an atypical infectious process. Aspiration cannot be excluded. Trace right pleural effusion seen. 3. Left lower quadrant colostomy site is unremarkable in appearance. Hartmann's pouch is within normal limits. 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 Mild diverticulosis along the distal transverse, descending and proximal sigmoid colon, without evidence of diverticulitis. 4. Small bilateral renal cysts seen. 5. Diffuse calcification along the abdominal aorta and its branches. 6. 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  02-26-15: kub: mild ileus  03-01-15: KUB: at least a partial small bowel obstruction. Is worse.   03-01-15: ct of abdomen and pelvis: 1. Positive for small bowel obstruction with a focal transition points in the right lower quadrant likely secondary to underlying  adhesions. No evidence of significant inflammation, bowel ischemic change or perforation. 2. Surgical changes of prior sigmoidectomy with Hartmann's pouch and left lower quadrant diverting colostomy. 3. Parastomal hernia containing omental fat and a short segment of small bowel. No evidence of mechanical obstruction at this location. 4. Bilateral lower lobe bronchiectasis with areas of partial bronchial impaction, atelectasis and pleural parenchymal scarring. 5. Diverticulosis of the residual  left colon without evidence of active diverticulitis. 6. 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  03-05-15: kub: 1. Interim removal of NG tube. 2. Persistent dilated loops of small bowel are noted. Colonic gas pattern is normal. No free air    LABS REVIEWED:    04-26-14: glucose 81; bun 17; creat 1.33; k+4.7; na++139; phos 4.5; psa 1.24 08-14-14: wbc 8.8; hgb 8.2; hct 28.5; mcv 71.8; plt 250; glucose 72; bun 13; creat 1.13; k+ 4.7; na++140; liver normal albumin 3.2; phos 3.3; mag 1.6; tsh 0.298  08-26-14: wbc 11.0; hgb 7.7; hct 27.1; mcv 74.5. ;plt 297; glucose 124; bun 19; creat 1.21; k+3.6; na++141; liver normal albumin 2.9; urine culture: gram neg rods.  10-02-14: tsh 0.236  10-21-14 :wbc 15.7; hgb 7.6; hct 25.4; mcv 67.9; plt 402; glucose 87; bun 19; creat 1.14; k+ 3.8; na++135; liver normal albumin 2.7; blood culture X2: no growth; HIV; nr 10-22-14: wbc 8.2; hgb 7.0; hct 23.1; mcv 67.7; plt 365; glucose 83; bun 11; creat 1.06; k+ 3.6; n++139; tsh 0.088 10-23-14: urine culture: klebsiella pneumonia proteus mirabilis: septra 10-25-14; wbc 9.8; hgb 9.8; hct 31.3; mcv 72.8; plt 396; glucose 72; bun 9; creat 0.85; k+ 3.7; na++139; tsh 0.267 12-08-14: tsh 0.195; free T4: 1.62 (normal)  02-26-15: wbc 13,4 hgb 10,6l hct 34.5 mcv 73.2; plt 336; glucose 66; bun 36; creat 1.34;  k+ 5.1; na++ 134; liver normal albumin 3.4; amylase 61; lipase 24; U/A +  03-01-15: wbc 16.0; hgb 10.4; hct 33.8; mcv 71.5; ;plt 335; glucose 99; bun 35; creat 1.41; k+ 3.5; na++136; ast 13; alt 11; alk phos 78; albumin 2.8 03-02-15: blood culture: no growth; urine culture: minimal: providencia stuartii; pseudomonas aeruginosa; proteus mirabilis 02-21-15: wbc 12.5; hgb 8.6; hct 29.5; mcv 73.9; plt 276; glucose 98; bun 7; creat 0.92; creat 4.5;na++138; vit B12: 1457; folate 47.4; iron 7; tibc 235; mag 1.9 pre-albumin 68  03-06-15: glucose 92; bun 7; creat 1.04; k+ 4.8; na++139  03-14-15: wbc 8.6; hgb 8.5; hct 28.1; mcv 72.6; plt 297; glucose 70; bun 12; creat 0.91; k+ 4.5; na++140 pre-albumin 15      Review of Systems Constitutional: Negative for appetite change and fatigue.  HENT: Negative for congestion.   Respiratory: Negative for cough, chest tightness and shortness of breath.   Cardiovascular: Negative for chest pain, palpitations and leg swelling.  Gastrointestinal:has nausea and vomiting  Musculoskeletal: Negative for myalgias and arthralgias.  Skin: Negative for pallor. Psychiatric/Behavioral: The patient is not nervous/anxious.      Physical Exam Constitutional: frail. No distress.  Eyes: Conjunctivae are normal.  Neck: Neck supple. No JVD present. No thyromegaly present.  Cardiovascular: Normal rate, regular rhythm and post tibial and pedal pulses non-palpable    Respiratory: Effort normal and breath sounds normal. No respiratory distress. He has no wheezes.  GI: abdomen soft nontender; no distension; bowel sounds present in all four quads  Has left lower quad colostomy   Has s/p foley  Musculoskeletal: He exhibits no edema.  Able to move upper extremities Does not move lower extremities    Lymphadenopathy:    He has no cervical adenopathy.  Neurological: He is alert.  Skin: Skin is warm and dry. He is not diaphoretic.  Right second toe: necrotic with scant amount of  purulent drainage present Psychiatric: confused.     ASSESSMENT/ PLAN:  Sepsis: will send him to the ED for  further evaluation and treatment options.   Time spent with patient  45  minutes >50% time spent counseling; reviewing medical record; tests; labs; and developing future plan of care   Ok Edwards NP [facility]  Contact [phone] Monday through Friday 8am- 5pm  After hours call [phone]

## 2015-04-15 NOTE — Progress Notes (Signed)
[facility] PROGRESS NOTE  [name] ZOX:[id number] DOB: 1945-12-11 DOA: 04/13/2015 PCP: Kirt Boys, DO  Assessment/Plan: 1. Sepsis -Present on admission, evidenced by acute encephalopathy, white count of 12,600, acute kidney injury with creatinine of 8.4 with source of infection likely secondary to osteomyelitis involving second proximal phalanx of right foot. -In the outpatient setting amputation had been recommended however patient refused this intervention. -He had been treated with IV vancomycin and Zosyn through PICC line. -Nephrology consulted regarding renal failure recommended discontinuation of IV vancomycin suspecting vancomycin-induced nephrotoxicity.  -On Daptomycin as recommended by nephrology -Remains afebrile, blood pressure stable. Blood cultures drawn 04/14/2015 showing no growth to date.  2.  Acute hypoxemic hypercarbic respiratory failure -Evidenced by a pH of 7.14 with PCO2 of 78, presenting with respiratory distress and acute encephalopathy. -Chest x-ray performed in the emergency department did not reveal acute infiltrate. -He is a DNR, admitted to stepdown on BiPAP -ABG revealing respiratory acidosis, will order a V/Q scan to assess for the possibility of pulmonary embolism since chest x-ray did not reveal acute infiltrate.  3. Acute kidney injury. -Lab work revealing a creatinine of 8.6 on admission, increased from 0.9 from labs drawn on 03/14/2015. -Acute kidney injury could be related to multiple factors including sepsis/critical illness, ATN, hypotension, vancomycin-induced nephrotoxicity -Nephrology has been consulted. Pending bilateral renal ultrasound. IV vancomycin discontinued  -Patient would like to be poor candidate for renal replacement therapy -Repeat labs this morning are pending  4.  Acute encephalopathy. -Craig [name] at baseline having a poor functional status, resident at SNF, having multiple comorbidities presenting with mental  status changes -I suspect acute encephalopathy related to multiple factors including sepsis/infection, acute renal failure, hypoxemic hypercarbic respiratory failure, dehydration.  -On 04/15/2015 showing clinical improvement  5.  Acute osteomyelitis. -Craig [name] having history of ulceration involving second toe of right foot, had been seen recently in the office by vascular surgery at which time they recommended hospitalization for amputation. Patient at that time refused hospitalization and was treated with IV antibiotics. -X-ray performed in the emergency department showing lytic destruction of the second proximal phalanx consistent with acute osteomyelitis. -Craig [name] was seen and evaluated by Dr.Duda of orthopedic surgery of 04/14/2015 who recommended continuing medical management for now, being off on amputation.  6.  Urinary tract infection. -Urinalysis showed the presence of many bacteria, nitrates and leukocytes. -He had been on broad-spectrum IV antibiotic coverage with Zosyn and IV vancomycin. -Now on Daptomycin. Urine cultures pending  7.  Chronic obstructive pulmonary disease -He presented with hypoxemic hypercarbic respiratory failure and was started on systemic steroids in the lungs or treatments. -Will taper down IV steroids  8.  Severe protein calorie malnutrition -Patient having a a pre-albumin level of 7.0. At baseline has a poor functional status  9.  Medical goals of care. -Overall anything his prognosis is poor. At baseline he has poor functional status, wheelchair bound, having multiple medical conditions including severe peripheral vascular disease. He is presenting with sepsis, acute osteomyelitis involving second digit of right foot. Toe amputation has been recommended in the past by his surgeons however at that time he has refused to undergo this procedure. I spoke to Craig Craig [name] who is his caregiver. Craig. Effie Shy states that Craig [name] has not had Medication  with family members about 20 years. He has a sister that lives in [place], and he has taken over caregiving responsibilities. He confirms patient's DO NOT RESUSCITATE status.  -I spoke to Craig. Mathey on 04/15/2015.  Acute encephalopathy improved although I think he still has some confusion. We discussed his medical conditions and goals of care. He would like to treat what is treatable, including undergoing surgical intervention if this was recommended. -Palliative care consulted to facilitate further goals of care discussion.  Code Status: DO NOT RESUSCITATE Family Communication:  Disposition Plan: Admit to SDU   Consultants:  Nephrology  Orthopedic surgery   Antibiotics:  Daptomycin  HPI/Subjective: Craig [name] is a 70 year old gentleman with past medical history of peripheral vascular disease, chronic right toe ulcer, nonambulatory at baseline having poor functional status, severe protein malnutrition, who was seen by Dr Imogene Burn of vascular surgery on 03/30/2015. He was found to have frank pus draining from right second toe as it was recommended to him then that he be admitted to the hospital and evaluated by orthopedic surgery for amputation. It appears that the time he refused to go to the emergency department. He has been treated with IV vancomycin and Zosyn in the outpatient setting. Overnight he presented to the emergency department with acute encephalopathy. X-rays of his right foot showed lytic destruction of the second proximal phalanx consistent with acute osteomyelitis. An ABG revealed a pH of 7.198 with PCO2 of 63 and PaO2 of 73. Last revealed elevated creatinine of 8.6, previously 0.91 03/14/2015. Orthopedic surgery and nephrology were consulted.  Objective: Filed Vitals:   04/15/15 0727 04/15/15 0741  BP: 143/52   Pulse: 70   Temp:  98.2 F (36.8 C)  Resp: 13     Intake/Output Summary (Last 24 hours) at 04/15/15 0852 Last data filed at 04/15/15 0800  Gross per 24  hour  Intake    287 ml  Output    350 ml  Net    -63 ml   Filed Weights   04/13/15 1732  Weight: 72.576 kg (160 lb)    Exam:   General:  He is ill-appearing, cachectic, wasted, Now on nasal cannula oxygen, more awake and alert today.  Cardiovascular: Tachycardic regular rate rhythm normal S1-S2  Respiratory: Normal respiratory effort, diminished breath sounds bilaterally having bibasilar crackles  Abdomen: Status post colostomy placement  Musculoskeletal: Severe muscle atrophy  Data Reviewed: Basic Metabolic Panel:  Recent Labs Lab 04/13/15 2115 04/14/15 0519 04/14/15 0935  NA 141 143  --   K 4.3 4.5  --   CL 103 103  --   CO2 25 29  --   GLUCOSE 74 72  --   BUN 40* 40*  --   CREATININE 8.44* 8.60*  --   CALCIUM 9.0 8.6*  --   PHOS  --   --  8.0*   Liver Function Tests:  Recent Labs Lab 04/13/15 2115 04/14/15 0519  AST 11* 12*  ALT 9* 9*  ALKPHOS 47 40  BILITOT 1.3* 1.2  PROT 8.0 7.0  ALBUMIN 2.3* 2.0*   No results for input(s): LIPASE, AMYLASE in the last 168 hours. No results for input(s): AMMONIA in the last 168 hours. CBC:  Recent Labs Lab 04/13/15 2115 04/14/15 0615 04/14/15 0935  WBC 12.6* 11.2* 10.2  NEUTROABS 9.3*  --  9.3*  HGB 8.5* 7.5* 7.3*  HCT 27.2* 26.4* 25.6*  MCV 71.6* 71.9* 71.9*  PLT 309 250 287   Cardiac Enzymes:  Recent Labs Lab 04/14/15 0935 04/14/15 1546  TROPONINI 0.03 <0.03   BNP (last 3 results) No results for input(s): BNP in the last 8760 hours.  ProBNP (last 3 results) No results for input(s): PROBNP in the last  8760 hours.  CBG: No results for input(s): GLUCAP in the last 168 hours.  Recent Results (from the past 240 hour(s))  Culture, blood (Routine X 2) w Reflex to ID Panel     Status: None (Preliminary result)   Collection Time: 04/14/15  4:11 AM  Result Value Ref Range Status   Specimen Description BLOOD LEFT ARM  Final   Special Requests BOTTLES DRAWN AEROBIC ONLY 5ML  Final   Culture  PENDING  Incomplete   Report Status PENDING  Incomplete  Culture, blood (Routine X 2) w Reflex to ID Panel     Status: None (Preliminary result)   Collection Time: 04/14/15  4:16 AM  Result Value Ref Range Status   Specimen Description BLOOD LEFT HAND  Final   Special Requests BOTTLES DRAWN AEROBIC ONLY 5ML  Final   Culture PENDING  Incomplete   Report Status PENDING  Incomplete  MRSA PCR Screening     Status: Abnormal   Collection Time: 04/14/15 10:03 AM  Result Value Ref Range Status   MRSA by PCR POSITIVE (A) NEGATIVE Final    Comment:        The GeneXpert MRSA Assay (FDA approved for NASAL specimens only), is one component of a comprehensive MRSA colonization surveillance program. It is not intended to diagnose MRSA infection nor to guide or monitor treatment for MRSA infections. RESULT CALLED TO, READ BACK BY AND VERIFIED WITH: RN Sherlean FootJ CLEAVER AT 1220 [id number] MARTINB      Studies: Dg Chest 2 View  04/13/2015  CLINICAL DATA:  PICC line placement. EXAM: CHEST  2 VIEW COMPARISON:  March 02, 2015. FINDINGS: Stable cardiomediastinal silhouette. No pneumothorax or pleural effusion is noted. Atherosclerosis of thoracic aorta is noted. No acute pulmonary disease is noted. Right-sided PICC line is noted with distal tip in expected position of the SVC. Bony thorax is unremarkable. IMPRESSION: Right-sided PICC line is noted with distal tip in expected position of the SVC. Electronically Signed   By: Lupita RaiderJames  Green Jr, M.D.   On: 04/13/2015 19:26   [place] Renal  04/14/2015  CLINICAL DATA:  Acute kidney injury, hypertension, COPD, alcoholic cirrhosis, coronary artery disease post MI EXAM: RENAL / URINARY TRACT ULTRASOUND COMPLETE COMPARISON:  CT abdomen and pelvis 03/01/2015 FINDINGS: Right Kidney: Length: 12.1 cm. Normal cortical thickness. Increased cortical echogenicity. Hypoechoic nodule mid RIGHT kidney 2.0 x 2.0 x 1.9 cm, unchanged from prior CT, consistent with minimally complicated cyst.  No additional mass, hydronephrosis or shadowing calcification. Left Kidney: Length: 11.6 cm. Less well visualized than RIGHT kidney due to body habitus. Normal cortical thickness. Increased cortical echogenicity. No mass, hydronephrosis or shadowing calcification grossly visualized. Bladder: Decompressed by a suprapubic catheter, unable to evaluate IMPRESSION: Echogenic renal parenchyma bilaterally consistent with medical renal disease. Minimally complicated RIGHT renal cyst. No additional mass or hydronephrosis grossly visualized. Electronically Signed   By: Ulyses SouthwardMark  Boles M.D.   On: 04/14/2015 14:46   Dg Foot Complete Right  04/13/2015  CLINICAL DATA:  Open wound of second toe. EXAM: RIGHT FOOT COMPLETE - 3+ VIEW COMPARISON:  April 27, 2013. FINDINGS: Vascular calcifications are noted. Lytic destruction of the distal portion of the second proximal phalanx is noted consistent with osteomyelitis. No other acute bony abnormality is noted. No radiopaque foreign body is noted. IMPRESSION: Lytic destruction of the second proximal phalanx is noted consistent with acute osteomyelitis. Electronically Signed   By: Lupita RaiderJames  Green Jr, M.D.   On: 04/13/2015 19:47    Scheduled Meds: . amiodarone  200 mg Oral Daily  . aspirin EC  81 mg Oral Daily  . escitalopram  10 mg Oral Daily  . feeding supplement (ENSURE ENLIVE)  237 mL Oral BID BM  . feeding supplement (PRO-STAT SUGAR FREE 64)  30 mL Oral TID  . ferumoxytol  510 mg Intravenous Weekly  . fluticasone furoate-vilanterol  1 puff Inhalation Daily  . folic acid  1 mg Oral Daily  . heparin  5,000 Units Subcutaneous 3 times per day  . ipratropium-albuterol  3 mL Nebulization Q4H  . lamoTRIgine  50 mg Oral Daily  . levothyroxine  125 mcg Oral QAC breakfast  . methylPREDNISolone (SOLU-MEDROL) injection  40 mg Intravenous Q6H  . multivitamin with minerals  1 tablet Oral Daily  . pantoprazole  40 mg Oral Daily  . sodium chloride flush  3 mL Intravenous Q12H  .  thiamine  100 mg Oral Daily  . vitamin C  500 mg Oral BID   Continuous Infusions: . sodium chloride 10 mL/hr at 04/14/15 1050  . sodium chloride 50 mL/hr at 04/14/15 1050    Principal Problem:   Sepsis (HCC) Active Problems:   AKI (acute kidney injury) (HCC)   Osteomyelitis (HCC)   UTI (urinary tract infection)   Protein-calorie malnutrition, severe (HCC)   Failure to thrive in adult   Abscess of second toe of right foot    Time spent: 40 min    Adrionna Delcid  [facility] Pager [phone]. If 7PM-7AM, please contact night-coverage at [email or web address], password [facility] 04/15/2015, 8:52 AM  LOS: 2 days

## 2015-04-15 NOTE — Consult Note (Signed)
Consultation Note Date: 04/15/2015   Patient Name: Craig [name]  DOB: 21-Sep-1945  MRN: [id number]  Age / Sex: 70 y.o., male  PCP: Kirt Boys, DO Referring Physician: Jeralyn Bennett, MD  Reason for Consultation: Establishing goals of care    Clinical Assessment/Narrative: Patient is a 70 year old man admitted 04/13/2015 with decreased level of consciousness, acute encephalopathy, and acute kidney injury. His creatinine on admission was 8.4, was source of infection likely osteomyelitis involving the second toe of the right foot. In the outpatient setting amputation had been recommended to patient however he had refused at that time. Nephrology was consulted and felt that acute kidney injury was related to vancomycin and this was stopped patient's respiratory status is improving. Chest x-ray did not reveal infiltrates. 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 He has been inconsistent in the past regarding hospitalization, amputation. Peripheral orthopedic surgery note patient is a poor candidate for wound healing and concern is if he wishes to go forward with amputation it could involve above-the-knee amputation. Patient's healthcare power of attorney is a long-standing friend, Bronson Bressman at [phone]. Craig [name] tells me that he spoke with the patient on 04/14/2015 with a physician and nurse present while they talked about amputation during that discussion patient has changed his mind and now is wishing to pursue amputation even if it means above the knee amputation. He understands without the surgery the infection could ultimately take his life; he also understands that the surgery itself is very risky and he has a poor chance of healing. When I spoke to patient today, he was consistent in his response from what Mr.  Effie [name] shares with me in that he does still verbalizes he wants to have amputation even if it means above-the-knee. He is able to verbalize the risks and benefits of the surgery. While in the hospital he has refused some of his medications as well as breathing treatments from some caregivers but he was compliant with medical recommendations, medications today  Contacts/Participants in Discussion: Primary Decision Maker: Pt can participate in discussion   Relationship to Patient n/a HCPOA: yes  Alva Garnet [phone] Pt also has a brother and a sister with whom he has not been in contact for about 20 years. Craig [name] shares with me that he did talk to patient's sister, Craig [name], at [phone] and updated her as to patient's current clinical condition as well as potential amputation. I myself called Miss Bona; she does verify Craig [name] ability to make decisions for patient if he is unable to make decisions for himself and she is in agreement with whatever patient and Craig [name] decide  SUMMARY OF RECOMMENDATIONS Patient wishes to move forward with amputation of right toes and understands that he has a poor potential to heal and that surgery could be a right above-the-knee amputation Discussed with Craig [name] patient's poor functional status, that he is living in a nursing home, is wheelchair bound, but feels that patient still has the ability to make decisions for himself and supports this decision for amputation  Code Status/Advance Care Planning: DNR    Code Status Orders        Start     Ordered   04/14/15 0258  Do not attempt resuscitation (DNR)   Continuous    Question Answer Comment  In the event of cardiac or respiratory ARREST Do not call a "code blue"  In the event of cardiac or respiratory ARREST Do not perform Intubation, CPR, defibrillation or ACLS   In the event of cardiac or respiratory ARREST Use medication by any route, position, wound care, and  other measures to relive pain and suffering. May use oxygen, suction and manual treatment of airway obstruction as needed for comfort.      04/14/15 0257    Code Status History    Date Active Date Inactive Code Status Order ID Comments User Context   04/14/2015  2:45 AM 04/14/2015  2:57 AM DNR [id number]  Michael LitterNikki Carter, MD ED   03/01/2015 11:10 PM 03/06/2015  5:40 PM DNR [id number]  Maretta BeesShanker M Ghimire, MD Inpatient   10/23/2014 10:19 AM 10/23/2014  9:31 PM DNR [id number]  Rosalin HawkingZeba Anwar, MD Inpatient   10/21/2014 11:52 AM 10/23/2014 10:19 AM Full Code [id number]  Vassie Lollarlos Madera, MD Inpatient   04/26/2013  5:45 PM 05/03/2013  8:00 PM Full Code [id number]  Eddie NorthNishant Dhungel, MD ED    Advance Directive Documentation        Most Recent Value   Type of Advance Directive  Out of facility DNR (pink MOST or yellow form)   Pre-existing out of facility DNR order (yellow form or pink MOST form)     "MOST" Form in Place?        Other Directives:None  Symptom Management:   Pain: Attending and surgical services managing at this point  Dyspnea: Improving. Continue O2 via nasal cannula and targeted pulmonary treatments  Palliative Prophylaxis:   Delirium Protocol, Frequent Pain Assessment, Oral Care and Turn Reposition  Additional Recommendations (Limitations, Scope, Preferences):  Full Scope Treatment except for DNR/DNI  Psycho-social/Spiritual:  Support System: Adequate Desire for further Chaplaincy support:no   Prognosis: Unable to determine  Discharge Planning: Skilled Nursing Facility for rehab with Palliative care service follow-up   Chief Complaint/ Primary Diagnoses: Present on Admission:  . Osteomyelitis (HCC) . AKI (acute kidney injury) (HCC) . Sepsis (HCC) . Failure to thrive in adult . UTI (urinary tract infection) . Abscess of second toe of right foot . Protein-calorie malnutrition, severe (HCC)  I have reviewed the medical record, interviewed the patient and family, and examined the  patient. The following aspects are pertinent.  Past Medical History  Diagnosis Date  . COPD (chronic obstructive pulmonary disease) (HCC)   . Hypertension   . Myocardial infarction (HCC)   . A-fib (HCC)   . GERD (gastroesophageal reflux disease)   . Hepatitis B 04/27/2013  . Dysphagia 04/27/2013  . Diverticulosis 03/05/2013  . Sepsis (HCC) 04/26/2013  . Alcoholic cirrhosis of liver without ascites (HCC)   . Coronary artery disease   . Thyroid disease   . Depression   . Urinary retention    Social History   Social History  . Marital Status: Unknown    Spouse Name: N/A  . Number of Children: N/A  . Years of Education: N/A   Social History Main Topics  . Smoking status: Former Games developermoker  . Smokeless tobacco: None  . Alcohol Use: No  . Drug Use: No  . Sexual Activity: Not Currently   Other Topics Concern  . None   Social History Narrative   No family history on file. Scheduled Meds: . sodium chloride   Intravenous Once  . amiodarone  200 mg Oral Daily  . aspirin EC  81 mg Oral Daily  . escitalopram  10 mg Oral Daily  . feeding supplement (ENSURE ENLIVE)  237 mL Oral BID BM  .  feeding supplement (PRO-STAT SUGAR FREE 64)  30 mL Oral TID  . ferumoxytol  510 mg Intravenous Weekly  . fluticasone furoate-vilanterol  1 puff Inhalation Daily  . folic acid  1 mg Oral Daily  . heparin  5,000 Units Subcutaneous 3 times per day  . ipratropium-albuterol  3 mL Nebulization Q4H  . lamoTRIgine  50 mg Oral Daily  . levothyroxine  125 mcg Oral QAC breakfast  . methylPREDNISolone (SOLU-MEDROL) injection  40 mg Intravenous Q6H  . multivitamin with minerals  1 tablet Oral Daily  . pantoprazole  40 mg Oral Daily  . piperacillin-tazobactam (ZOSYN)  IV  2.25 g Intravenous 3 times per day  . sodium chloride flush  3 mL Intravenous Q12H  . thiamine  100 mg Oral Daily  . vitamin C  500 mg Oral BID   Continuous Infusions: . sodium chloride 10 mL/hr at 04/14/15 1050  . sodium chloride 50  mL/hr at 04/14/15 1050   PRN Meds:.acetaminophen **OR** acetaminophen, sodium chloride flush, technetium TC 75M diethylenetriame-pentaacetic acid Medications Prior to Admission:  Prior to Admission medications   Medication Sig Start Date End Date Taking? Authorizing Provider  Amino Acids-Protein Hydrolys (FEEDING SUPPLEMENT, PRO-STAT SUGAR FREE 64,) LIQD Take 60 mLs by mouth 2 (two) times daily. 9am, 6pm   Yes Historical Provider, MD  amiodarone (PACERONE) 200 MG tablet Take 200 mg by mouth daily.   Yes Historical Provider, MD  aspirin EC 81 MG tablet Take 81 mg by mouth daily.   Yes Historical Provider, MD  cetirizine (ZYRTEC) 10 MG tablet Take 10 mg by mouth daily as needed for allergies.    Yes Historical Provider, MD  escitalopram (LEXAPRO) 10 MG tablet Take 10 mg by mouth daily.   Yes Historical Provider, MD  Fluticasone Furoate-Vilanterol (BREO ELLIPTA) 100-25 MCG/INH AEPB Inhale 1 puff into the lungs daily.    Yes Historical Provider, MD  folic acid (FOLVITE) 1 MG tablet Take 1 mg by mouth daily.   Yes Historical Provider, MD  hydrOXYzine (ATARAX/VISTARIL) 25 MG tablet Take 25 mg by mouth every 6 (six) hours as needed for itching.   Yes Historical Provider, MD  Iron Polysacch Cmplx-B12-FA 150-0.025-1 MG CAPS Take 1 capsule by mouth daily.   Yes Historical Provider, MD  K Phos Mono-Sod Phos Di & Mono (PHOSPHA 250 NEUTRAL PO) Take 250 mg by mouth 2 (two) times daily. 9am, 6pm   Yes Historical Provider, MD  lamoTRIgine (LAMICTAL) 25 MG tablet Take 50 mg by mouth daily.    Yes Historical Provider, MD  levothyroxine (SYNTHROID, LEVOTHROID) 125 MCG tablet Take 125 mcg by mouth daily before breakfast.   Yes Historical Provider, MD  Melatonin 3 MG TABS Take 3 mg by mouth at bedtime.    Yes Historical Provider, MD  morphine (ROXANOL) 20 MG/ML concentrated solution Take 4 mg by mouth every 2 (two) hours as needed for severe pain.   Yes Historical Provider, MD  Multiple Vitamins-Minerals (DECUBI-VITE  PO) Take 2 capsules by mouth daily.    Yes Historical Provider, MD  Nutritional Supplements (NUTRITIONAL SUPPLEMENT PO) Take by mouth 2 (two) times daily with a meal. House Supplement   Yes Historical Provider, MD  Nutritional Supplements (NUTRITIONAL SUPPLEMENT PO) Take 90 mLs by mouth 3 (three) times daily. House 2.0 supplement   Yes Historical Provider, MD  omeprazole (PRILOSEC) 20 MG capsule Take 20 mg by mouth daily before breakfast.    Yes Historical Provider, MD  ondansetron (ZOFRAN) 4 MG tablet Take 4 mg  by mouth every 6 (six) hours as needed for nausea or vomiting.   Yes Historical Provider, MD  OXYGEN Inhale into the lungs continuous. 2 L/min  Maintain Sp02 >92%   Yes Historical Provider, MD  piperacillin-tazobactam (ZOSYN) 3.375 GM/50ML IVPB Inject 3.375 g into the vein every 6 (six) hours. 6 week course started 03/30/15 (end date 05/11/15)   Yes Historical Provider, MD  polyethylene glycol (MIRALAX / GLYCOLAX) packet Take 17 g by mouth daily. Mix in 8 oz liquid and drink   Yes Historical Provider, MD  Povidone-Iodine (BETADINE EX) Apply 1 application topically daily. Apply to right 2nd toe abscess area daily   Yes Historical Provider, MD  promethazine (PHENERGAN) 25 MG tablet Take 25 mg by mouth every 6 (six) hours as needed for nausea or vomiting.   Yes Historical Provider, MD  thiamine 100 MG tablet Take 100 mg by mouth daily.   Yes Historical Provider, MD  vancomycin in sodium chloride 0.9 % 250 mL Inject 1,250 mg into the vein every 12 (twelve) hours. Start dated 04/03/15   Yes Historical Provider, MD  vitamin C (ASCORBIC ACID) 500 MG tablet Take 500 mg by mouth 2 (two) times daily. 9am, 6pm   Yes Historical Provider, MD   No Known Allergies  Review of Systems  Constitutional: Positive for fatigue.  HENT: Negative.   Eyes: Negative.   Respiratory: Negative.   Cardiovascular: Negative.   Endocrine: Negative.   Genitourinary: Negative.   Musculoskeletal: Positive for back pain.    Allergic/Immunologic: Negative.   Neurological: Positive for weakness and numbness.  Hematological: Negative.   Psychiatric/Behavioral: Negative.     Physical Exam  Nursing note and vitals reviewed. Constitutional: He is oriented to person, place, and time. He appears well-developed.  Older man in no acute distress  HENT:  Head: Normocephalic and atraumatic.  Neck: Normal range of motion.  Cardiovascular: Normal rate and regular rhythm.   Respiratory: Effort normal.  Musculoskeletal: Normal range of motion.  Neurological: He is alert and oriented to person, place, and time.  Speech is difficult to understand at times  Skin: Skin is warm and dry.  Psychiatric: He has a normal mood and affect.    Vital Signs: BP 121/52 mmHg  Pulse 68  Temp(Src) 97.7 F (36.5 C) (Oral)  Resp 9  Ht 6\' 3"  (1.905 m)  Wt 72.576 kg (160 lb)  BMI 20.00 kg/m2  SpO2 99%  SpO2: SpO2: 99 % O2 Device:SpO2: 99 % O2 Flow Rate: .O2 Flow Rate (L/min): 4 L/min  IO: Intake/output summary:  Intake/Output Summary (Last 24 hours) at 04/15/15 1343 Last data filed at 04/15/15 1317  Gross per 24 hour  Intake    397 ml  Output    200 ml  Net    197 ml    LBM:   Baseline Weight: Weight: 72.576 kg (160 lb) Most recent weight: Weight: 72.576 kg (160 lb)      Palliative Assessment/Data:  Flowsheet Rows        Most Recent Value   Intake Tab    Referral Department  Hospitalist   Unit at Time of Referral  Cardiac/Telemetry Unit   Palliative Care Primary Diagnosis  Sepsis/Infectious Disease   Date Notified  04/15/15   Palliative Care Type  New Palliative care   Reason for referral  Clarify Goals of Care   Date of Admission  04/13/15   Date first seen by Palliative Care  04/15/15   # of days Palliative referral response time  0 Day(s)   # of days IP prior to Palliative referral  2   Clinical Assessment    Palliative Performance Scale Score  40%   Pain Max last 24 hours  Not able to report   Pain Min  Last 24 hours  Not able to report   Dyspnea Max Last 24 Hours  Not able to report   Dyspnea Min Last 24 hours  Not able to report   Nausea Max Last 24 Hours  Not able to report   Nausea Min Last 24 Hours  Not able to report   Anxiety Max Last 24 Hours  Not able to report   Anxiety Min Last 24 Hours  Not able to report   Other Max Last 24 Hours  Not able to report   Psychosocial & Spiritual Assessment    Palliative Care Outcomes    Patient/Family meeting held?  Yes   Who was at the meeting?  spoke to [facility] as well as siste on the phone   Palliative Care Outcomes  Clarified goals of care      Additional Data Reviewed:  CBC:    Component Value Date/Time   WBC 8.5 04/15/2015 1015   WBC 8.6 03/14/2015   HGB 6.4* 04/15/2015 1015   HCT 21.3* 04/15/2015 1015   PLT 261 04/15/2015 1015   MCV 69.2* 04/15/2015 1015   NEUTROABS 9.3* 04/14/2015 0935   LYMPHSABS 0.7 04/14/2015 0935   MONOABS 0.1 04/14/2015 0935   EOSABS 0.0 04/14/2015 0935   BASOSABS 0.0 04/14/2015 0935   Comprehensive Metabolic Panel:    Component Value Date/Time   NA 143 04/14/2015 0519   NA 140 03/14/2015   K 4.5 04/14/2015 0519   CL 103 04/14/2015 0519   CO2 29 04/14/2015 0519   BUN 40* 04/14/2015 0519   BUN 12 03/14/2015   CREATININE 8.60* 04/14/2015 0519   CREATININE 0.9 03/14/2015   GLUCOSE 72 04/14/2015 0519   CALCIUM 8.6* 04/14/2015 0519   AST 12* 04/14/2015 0519   ALT 9* 04/14/2015 0519   ALKPHOS 40 04/14/2015 0519   BILITOT 1.2 04/14/2015 0519   PROT 7.0 04/14/2015 0519   ALBUMIN 2.0* 04/14/2015 0519     Time In: 0900 Time Out: 1030 Time Total: 90 min Greater than 50%  of this time was spent counseling and coordinating care related to the above assessment and plan.  Signed by: Irean Hong, NP  Irean Hong, NP  04/15/2015, 1:43 PM  Please contact Palliative Medicine Team phone at [phone] for questions and concerns.

## 2015-04-15 NOTE — Progress Notes (Signed)
VASCULAR LAB      Patient had ABI and arterial duplex 03/24/15 that demonstrated an ABI of 0.70 on the right and 0.93 on the left.  Duplex scan revealed extensive bilateral atherosclerosis with right > left significant aorta iliac intrarterial vascular disease and significant left mid SFA stenosis.  Report can be viewed under results review.  Please advise if you would like study repeated.   Annalyn Blecher, RVT 04/15/2015, 1:53 PM

## 2015-04-15 NOTE — Progress Notes (Signed)
Subjective: Interval History: has no complaint ,doing all right.  No pain.  Objective: Vital signs in last 24 hours: Temp:  [97.3 F (36.3 C)-98.4 F (36.9 C)] 98.2 F (36.8 C) (04/02 0741) Pulse Rate:  [63-83] 70 (04/02 0727) Resp:  [8-24] 13 (04/02 0727) BP: (118-171)/(48-99) 143/52 mmHg (04/02 0727) SpO2:  [88 %-100 %] 97 % (04/02 0727) FiO2 (%):  [40 %] 40 % (04/01 1206) Weight change:   Intake/Output from previous day: 04/01 0701 - 04/02 0700 In: 247 [I.V.:130; IV Piggyback:117] Out: 350 [Urine:150; Stool:200] Intake/Output this shift:    General appearance: cooperative, cachectic and slowed mentation Resp: diminished breath sounds bilaterally and rhonchi bibasilar Cardio: S1, S2 normal and systolic murmur: holosystolic 2/6, blowing at apex GI: ostomy mid abdm, pos bs Extremities: ischemic feet,ulcer L heel  Lab Results:  Recent Labs  04/14/15 0615 04/14/15 0935  WBC 11.2* 10.2  HGB 7.5* 7.3*  HCT 26.4* 25.6*  PLT 250 287   BMET:  Recent Labs  04/13/15 2115 04/14/15 0519  NA 141 143  K 4.3 4.5  CL 103 103  CO2 25 29  GLUCOSE 74 72  BUN 40* 40*  CREATININE 8.44* 8.60*  CALCIUM 9.0 8.6*   No results for input(s): PTH in the last 72 hours. Iron Studies:  Recent Labs  04/14/15 0935  IRON 16*  TIBC 211*    Studies/Results: Dg Chest 2 View  04/13/2015  CLINICAL DATA:  PICC line placement. EXAM: CHEST  2 VIEW COMPARISON:  March 02, 2015. FINDINGS: Stable cardiomediastinal silhouette. No pneumothorax or pleural effusion is noted. Atherosclerosis of thoracic aorta is noted. No acute pulmonary disease is noted. Right-sided PICC line is noted with distal tip in expected position of the SVC. Bony thorax is unremarkable. IMPRESSION: Right-sided PICC line is noted with distal tip in expected position of the SVC. Electronically Signed   By: Lupita Raider, M.D.   On: 04/13/2015 19:26   US Renal  04/14/2015  CLINICAL DATA:  Acute kidney injury,  hypertension, COPD, alcoholic cirrhosis, coronary artery disease post MI EXAM: RENAL / URINARY TRACT ULTRASOUND COMPLETE COMPARISON:  CT abdomen and pelvis 03/01/2015 FINDINGS: Right Kidney: Length: 12.1 cm. Normal cortical thickness. Increased cortical echogenicity. Hypoechoic nodule mid RIGHT kidney 2.0 x 2.0 x 1.9 cm, unchanged from prior CT, consistent with minimally complicated cyst. No additional mass, hydronephrosis or shadowing calcification. Left Kidney: Length: 11.6 cm. Less well visualized than RIGHT kidney due to body habitus. Normal cortical thickness. Increased cortical echogenicity. No mass, hydronephrosis or shadowing calcification grossly visualized. Bladder: Decompressed by a suprapubic catheter, unable to evaluate IMPRESSION: Echogenic renal parenchyma bilaterally consistent with medical renal disease. Minimally complicated RIGHT renal cyst. No additional mass or hydronephrosis grossly visualized. Electronically Signed   By: Ulyses Southward M.D.   On: 04/14/2015 14:46   Dg Foot Complete Right  04/13/2015  CLINICAL DATA:  Open wound of second toe. EXAM: RIGHT FOOT COMPLETE - 3+ VIEW COMPARISON:  April 27, 2013. FINDINGS: Vascular calcifications are noted. Lytic destruction of the distal portion of the second proximal phalanx is noted consistent with osteomyelitis. No other acute bony abnormality is noted. No radiopaque foreign body is noted. IMPRESSION: Lytic destruction of the second proximal phalanx is noted consistent with acute osteomyelitis. Electronically Signed   By: Lupita Raider, M.D.   On: 04/13/2015 19:47    I have reviewed the patient's current medications.  Assessment/Plan: 1 AKI Oliguric chem pending.  Vancomycin induced. No hemodynamic insult. 2 UTI  On AB 3 BOO foley 4 PVD severe with infx. 5 afib NSR today 6 Osteo 7 dementia 8 COPd off bipap P check chem, try conservative mgmt, foley , ab off vanco    LOS: 2 days   Craig [name] L 04/15/2015,8:21 AM

## 2015-04-16 DIAGNOSIS — E43 Unspecified severe protein-calorie malnutrition: Secondary | ICD-10-CM

## 2015-04-16 LAB — HEPATITIS C ANTIBODY (REFLEX): HCV AB: 0.1 {s_co_ratio} (ref 0.0–0.9)

## 2015-04-16 LAB — TYPE AND SCREEN
ABO/RH(D): O POS
ANTIBODY SCREEN: NEGATIVE
Unit division: 0
Unit division: 0

## 2015-04-16 LAB — BASIC METABOLIC PANEL
ANION GAP: 12 (ref 5–15)
BUN: 73 mg/dL — ABNORMAL HIGH (ref 6–20)
CO2: 25 mmol/L (ref 22–32)
Calcium: 8.4 mg/dL — ABNORMAL LOW (ref 8.9–10.3)
Chloride: 103 mmol/L (ref 101–111)
Creatinine, Ser: 10.25 mg/dL — ABNORMAL HIGH (ref 0.61–1.24)
GFR calc non Af Amer: 5 mL/min — ABNORMAL LOW (ref >60)
GFR, EST AFRICAN AMERICAN: 5 mL/min — AB (ref >60)
GLUCOSE: 121 mg/dL — AB (ref 65–99)
POTASSIUM: 4.2 mmol/L (ref 3.5–5.1)
Sodium: 140 mmol/L (ref 135–145)

## 2015-04-16 LAB — URINE CULTURE: Culture: >=100000

## 2015-04-16 LAB — CBC
HEMATOCRIT: 25.8 % — AB (ref 39.0–52.0)
HEMOGLOBIN: 8.2 g/dL — AB (ref 13.0–17.0)
MCH: 22.8 pg — ABNORMAL LOW (ref 26.0–34.0)
MCHC: 31.8 g/dL (ref 30.0–36.0)
MCV: 71.9 fL — ABNORMAL LOW (ref 78.0–100.0)
Platelets: 248 10*3/uL (ref 150–400)
RBC: 3.59 MIL/uL — AB (ref 4.22–5.81)
RDW: 20.2 % — ABNORMAL HIGH (ref 11.5–15.5)
WBC: 9.5 10*3/uL (ref 4.0–10.5)

## 2015-04-16 LAB — VANCOMYCIN, RANDOM: VANCOMYCIN RM: 146 ug/mL — AB

## 2015-04-16 LAB — HEPATITIS B SURFACE ANTIGEN: HEP B S AG: NEGATIVE

## 2015-04-16 LAB — HEPATITIS B SURFACE ANTIBODY,QUALITATIVE: Hep B S Ab: REACTIVE

## 2015-04-16 LAB — HEPATITIS B CORE ANTIBODY, IGM: Hep B C IgM: NEGATIVE

## 2015-04-16 LAB — HCV COMMENT:

## 2015-04-16 MED ORDER — FUROSEMIDE 10 MG/ML IJ SOLN
160.0000 mg | Freq: Three times a day (TID) | INTRAVENOUS | Status: DC
  Administered 2015-04-16 – 2015-04-23 (×19): 160 mg via INTRAVENOUS
  Filled 2015-04-16 (×26): qty 16

## 2015-04-16 MED ORDER — MUPIROCIN 2 % EX OINT
1.0000 "application " | TOPICAL_OINTMENT | Freq: Two times a day (BID) | CUTANEOUS | Status: AC
  Administered 2015-04-16 – 2015-04-21 (×10): 1 via NASAL
  Filled 2015-04-16 (×2): qty 22

## 2015-04-16 MED ORDER — IPRATROPIUM-ALBUTEROL 0.5-2.5 (3) MG/3ML IN SOLN: 3.0000 mL | Freq: Four times a day (QID) | RESPIRATORY_TRACT | Status: DC | PRN

## 2015-04-16 MED ORDER — CHLORHEXIDINE GLUCONATE CLOTH 2 % EX PADS
6.0000 | MEDICATED_PAD | Freq: Every day | CUTANEOUS | Status: AC
  Administered 2015-04-16 – 2015-04-17 (×2): 6 via TOPICAL

## 2015-04-16 MED ORDER — PREDNISONE 20 MG PO TABS
40.0000 mg | ORAL_TABLET | Freq: Every day | ORAL | Status: DC
  Administered 2015-04-16 – 2015-04-17 (×2): 40 mg via ORAL
  Filled 2015-04-16 (×2): qty 2

## 2015-04-16 NOTE — Progress Notes (Signed)
[facility] PROGRESS NOTE  Craig [name] QIO:[id number] DOB: January 21, 1945 DOA: 04/13/2015 PCP: Craig Boys, DO  Assessment/Plan: 1. Sepsis -Present on admission, evidenced by acute encephalopathy, white count of 12,600, acute kidney injury with creatinine of 8.4 with source of infection likely secondary to osteomyelitis involving second proximal phalanx of right foot. -In the outpatient setting amputation had been recommended however patient refused this intervention. -He had been treated with IV vancomycin and Zosyn through PICC line. -Nephrology consulted regarding renal failure recommended discontinuation of IV vancomycin suspecting vancomycin-induced nephrotoxicity.  -On Daptomycin as recommended by nephrology -Remains afebrile, blood pressure stable. Blood cultures drawn 04/14/2015 showing no growth to date.  2.  Acute hypoxemic hypercarbic respiratory failure -Evidenced by a pH of 7.14 with PCO2 of 78, presenting with respiratory distress and acute encephalopathy. -Chest x-ray performed in the emergency department did not reveal acute infiltrate. -ABG revealing respiratory acidosis -V/Q scan performed on 04/15/2015 did not reveal PE.  -He is a DNR  3. Acute kidney injury secondary to vancomycin induced nephrotoxicity. -Lab work revealing a creatinine of 8.6 on admission, increased from 0.9 from labs drawn on 03/14/2015. -Acute kidney injury likely related tovancomycin-induced nephrotoxicity, other factors which may have contributed include sepsis/critical illness and hypovolemia.  -IV Vancomycin discontinued -Nephrology consulted.  -Repeat labs showing Vanc level of 146. Creatinine increased to 10.25 from 8.6 on admission. Potassium stable at 4.2 and bicarb at 25. Mental status changes improved.  -Unsure if he would be a good candidate for renal replacement therapy. Await further recs from nephrology.   4.  Acute encephalopathy. -Craig [name] at baseline having a poor  functional status, resident at SNF, having multiple comorbidities presenting with mental status changes -I suspect acute encephalopathy related to multiple factors including sepsis/infection, acute renal failure, hypoxemic hypercarbic respiratory failure, dehydration.  -Improved. At this point I think he has capacity to make his own medical decisions.   5.  Acute osteomyelitis. -Craig. [name] having history of ulceration involving second toe of right foot, had been seen recently in the office by vascular surgery at which time they recommended hospitalization for amputation. Patient at that time refused hospitalization and was treated with IV antibiotics. -X-ray performed in the emergency department showing lytic destruction of the second proximal phalanx consistent with acute osteomyelitis. -Craig. [name] was seen and evaluated by Dr.Duda of orthopedic surgery of 04/14/2015 who recommended continuing medical management for now.   6.  Urinary tract infection. -Urinalysis showed the presence of many bacteria, nitrates and leukocytes. -He had been on broad-spectrum IV antibiotic coverage with Zosyn and IV vancomycin. -Now on Daptomycin.  -Urine culture growing Pseudomonas, currently on Zosyn. Susceptibility testing pending.   7.  Anemia -Labs showed Hg of 6.4, not having clinical evidence of active bleed. Suspect related to multiple factors such as iron deficiency, renal failure, sepsis/critical illness.  -He was transfused with 2 units of PRBC's on 04/15/2015  8.  Chronic obstructive pulmonary disease -He presented with hypoxemic hypercarbic respiratory failure and was started on systemic steroids in the lungs or treatments. -Will taper down steroids, stop IV solumedrol and start prednisone taper. Respiratory status improved.   9.  Severe protein calorie malnutrition -Patient having a a pre-albumin level of 7.0. At baseline has a poor functional status  10.  Medical goals of care. -Overall  anything his prognosis is poor. At baseline he has poor functional status, wheelchair bound, having multiple medical conditions including severe peripheral vascular disease. He is presenting with sepsis, acute osteomyelitis involving second digit  of right foot. Toe amputation has been recommended in the past by his surgeons however at that time he has refused to undergo this procedure. I spoke to Craig [name] who is his caregiver. Craig. Effie [name] states that Craig. [name] has not had Medication with family members about 20 years. He has a sister that lives in [place], and he has taken over caregiving responsibilities. He confirms patient's DO NOT RESUSCITATE status.  -I spoke to Craig. [name] on 04/15/2015. Acute encephalopathy improved although I think he still has some confusion. We discussed his medical conditions and goals of care. He would like to treat what is treatable, including undergoing surgical intervention if this was recommended. -Palliative care consulted to facilitate further goals of care discussion.  Code Status: DO NOT RESUSCITATE Family Communication:  Disposition Plan: Admit to SDU   Consultants:  Nephrology  Orthopedic surgery   Antibiotics: Zosyn Daptomycin  HPI/Subjective: Craig [name] is a 70 year old gentleman with past medical history of peripheral vascular disease, chronic right toe ulcer, nonambulatory at baseline having poor functional status, severe protein malnutrition, who was seen by Dr Imogene Burn of vascular surgery on 03/30/2015. He was found to have frank pus draining from right second toe as it was recommended to him then that he be admitted to the hospital and evaluated by orthopedic surgery for amputation. It appears that the time he refused to go to the emergency department. He has been treated with IV vancomycin and Zosyn in the outpatient setting. Overnight he presented to the emergency department with acute encephalopathy. X-rays of his right foot showed  lytic destruction of the second proximal phalanx consistent with acute osteomyelitis. An ABG revealed a pH of 7.198 with PCO2 of 63 and PaO2 of 73. Last revealed elevated creatinine of 8.6, previously 0.91 03/14/2015. Orthopedic surgery and nephrology were consulted.  Objective: Filed Vitals:   04/16/15 0500 04/16/15 0600  BP: 152/62 157/64  Pulse: 60 64  Temp:    Resp: 17 13    Intake/Output Summary (Last 24 hours) at 04/16/15 0729 Last data filed at 04/16/15 0600  Gross per 24 hour  Intake   1510 ml  Output    550 ml  Net    960 ml   Filed Weights   04/13/15 1732 04/16/15 0600  Weight: 72.576 kg (160 lb) 69.5 kg (153 lb 3.5 oz)    Exam:   General:  He is ill-appearing, cachectic, wasted, Now on nasal cannula oxygen, more awake and alert today.  Cardiovascular: Tachycardic regular rate rhythm normal S1-S2  Respiratory: Improve lung exam, good air movement, om [place] supplemental oxygen  Abdomen: Status post colostomy placement  Musculoskeletal: Severe muscle atrophy, there is ulceration/dry gangrene over 2nd toe of right foot.   Data Reviewed: Basic Metabolic Panel:  Recent Labs Lab 04/13/15 2115 04/14/15 0519 04/14/15 0935 04/15/15 1015 04/16/15 0427  NA 141 143  --  142 140  K 4.3 4.5  --  3.9 4.2  CL 103 103  --  102 103  CO2 25 29  --  26 25  GLUCOSE 74 72  --  119* 121*  BUN 40* 40*  --  60* 73*  CREATININE 8.44* 8.60*  --  9.74* 10.25*  CALCIUM 9.0 8.6*  --  8.5* 8.4*  PHOS  --   --  8.0*  --   --    Liver Function Tests:  Recent Labs Lab 04/13/15 2115 04/14/15 0519 04/15/15 1015  AST 11* 12* 10*  ALT 9* 9* 7*  ALKPHOS 47 40 35*  BILITOT 1.3* 1.2 1.1  PROT 8.0 7.0 7.0  ALBUMIN 2.3* 2.0* 2.0*   No results for input(s): LIPASE, AMYLASE in the last 168 hours. No results for input(s): AMMONIA in the last 168 hours. CBC:  Recent Labs Lab 04/13/15 2115 04/14/15 0615 04/14/15 0935 04/15/15 1015 04/16/15 0427  WBC 12.6* 11.2* 10.2 8.5 9.5   NEUTROABS 9.3*  --  9.3*  --   --   HGB 8.5* 7.5* 7.3* 6.4* 8.2*  HCT 27.2* 26.4* 25.6* 21.3* 25.8*  MCV 71.6* 71.9* 71.9* 69.2* 71.9*  PLT 309 250 287 261 248   Cardiac Enzymes:  Recent Labs Lab 04/14/15 0935 04/14/15 1546  TROPONINI 0.03 <0.03   BNP (last 3 results) No results for input(s): BNP in the last 8760 hours.  ProBNP (last 3 results) No results for input(s): PROBNP in the last 8760 hours.  CBG: No results for input(s): GLUCAP in the last 168 hours.  Recent Results (from the past 240 hour(s))  Culture, blood (Routine X 2) w Reflex to ID Panel     Status: None (Preliminary result)   Collection Time: 04/14/15  4:11 AM  Result Value Ref Range Status   Specimen Description BLOOD LEFT ARM  Final   Special Requests BOTTLES DRAWN AEROBIC ONLY  Final   Culture NO GROWTH 1 DAY  Final   Report Status PENDING  Incomplete  Culture, blood (Routine X 2) w Reflex to ID Panel     Status: None (Preliminary result)   Collection Time: 04/14/15  4:16 AM  Result Value Ref Range Status   Specimen Description BLOOD LEFT HAND  Final   Special Requests BOTTLES DRAWN AEROBIC ONLY  Final   Culture NO GROWTH 1 DAY  Final   Report Status PENDING  Incomplete  Urine culture     Status: None (Preliminary result)   Collection Time: 04/14/15  5:16 AM  Result Value Ref Range Status   Specimen Description URINE, SUPRAPUBIC  Final   Special Requests NONE  Final   Culture   Final    >=100,000 COLONIES/mL YEAST 40,000 COLONIES/ml PSEUDOMONAS AERUGINOSA    Report Status PENDING  Incomplete  MRSA PCR Screening     Status: Abnormal   Collection Time: 04/14/15 10:03 AM  Result Value Ref Range Status   MRSA by PCR POSITIVE (A) NEGATIVE Final    Comment:        The GeneXpert MRSA Assay (FDA approved for NASAL specimens only), is one component of a comprehensive MRSA colonization surveillance program. It is not intended to diagnose MRSA infection nor to guide or monitor treatment  for MRSA infections. RESULT CALLED TO, READ BACK BY AND VERIFIED WITH: RN Sherlean Foot AT [id number] MARTINB      Studies: US Renal  04/14/2015  CLINICAL DATA:  Acute kidney injury, hypertension, COPD, alcoholic cirrhosis, coronary artery disease post MI EXAM: RENAL / URINARY TRACT ULTRASOUND COMPLETE COMPARISON:  CT abdomen and pelvis 03/01/2015 FINDINGS: Right Kidney: Length: 12.1 cm. Normal cortical thickness. Increased cortical echogenicity. Hypoechoic nodule mid RIGHT kidney 2.0 x 2.0 x 1.9 cm, unchanged from prior CT, consistent with minimally complicated cyst. No additional mass, hydronephrosis or shadowing calcification. Left Kidney: Length: 11.6 cm. Less well visualized than RIGHT kidney due to body habitus. Normal cortical thickness. Increased cortical echogenicity. No mass, hydronephrosis or shadowing calcification grossly visualized. Bladder: Decompressed by a suprapubic catheter, unable to evaluate IMPRESSION: Echogenic renal parenchyma bilaterally consistent with medical renal disease. Minimally  complicated RIGHT renal cyst. No additional mass or hydronephrosis grossly visualized. Electronically Signed   By: Ulyses SouthwardMark  Boles M.D.   On: 04/14/2015 14:46   Nm Pulmonary Perf And Vent  04/15/2015  CLINICAL DATA:  Hypoxia, COPD, hypertension, coronary artery disease post MI, alcoholic cirrhosis, former smoker EXAM: NUCLEAR MEDICINE VENTILATION - PERFUSION LUNG SCAN TECHNIQUE: Ventilation images were obtained in multiple projections using inhaled aerosol Tc-4926m DTPA. Perfusion images were obtained in multiple projections after intravenous injection of Tc-2626m MAA. RADIOPHARMACEUTICALS:  30.8 mCi Technetium-2226m DTPA aerosol inhalation and 4.38 mCi Technetium-4426m MAA IV COMPARISON:  None Radiographic correlation: Chest radiograph 04/15/2015; CT chest 04/26/2013 FINDINGS: Ventilation: Markedly abnormal ventilation exam with small patchy areas of diminished and absent perfusion throughout both lungs.  Ventilation is most impaired lower lobes bilaterally Perfusion: Irregular perfusion throughout both lungs. Scattered small subsegmental perfusion defects largest in RIGHT middle lobe. Diminished perfusion in the posterior lungs particularly lower lobes bilaterally matching ventilation pattern. Overall, better perfusion than ventilation throughout both lungs. Chest radiograph: Bibasilar atelectasis greater on LEFT. Significant bronchiectasis in lower lobes by prior CT. IMPRESSION: Diffuse marked impairment in ventilation throughout both lungs compatible with parenchymal lung disease. Much less severely impaired perfusion in both lungs with a few small subsegmental perfusion defects. Findings are most suggestive of parenchymal lung disease and represent a low probability for pulmonary embolism. Electronically Signed   By: Ulyses SouthwardMark  Boles M.D.   On: 04/15/2015 11:02   Dg Chest Port 1 View  04/15/2015  CLINICAL DATA:  Shortness of breath and decreased oxygen saturation overnight last night, COPD, hypertension and atrial fibrillation, former smoker, coronary artery disease post MI, alcoholic cirrhosis EXAM: PORTABLE CHEST 1 VIEW COMPARISON:  Portable exam 1040 hours compared to 04/13/2015 and correlated with chest CT 04/26/2013 FINDINGS: RIGHT arm PICC line tip projects over SVC. Normal heart size and pulmonary vascularity. Calcified thoracic aorta. Mild bibasilar atelectasis. Remaining lungs grossly clear. No pleural effusion or pneumothorax. Bronchiectatic changes in the lower lobes on the prior CT from 2015 are not radiographically evident. 2 metallic foreign bodies question BBs project over the mid RIGHT chest. IMPRESSION: Bibasilar atelectasis. Electronically Signed   By: Ulyses SouthwardMark  Boles M.D.   On: 04/15/2015 10:55    Scheduled Meds: . amiodarone  200 mg Oral Daily  . aspirin EC  81 mg Oral Daily  . escitalopram  10 mg Oral Daily  . feeding supplement (ENSURE ENLIVE)  237 mL Oral BID BM  . feeding supplement  (PRO-STAT SUGAR FREE 64)  30 mL Oral TID  . ferumoxytol  510 mg Intravenous Weekly  . fluticasone furoate-vilanterol  1 puff Inhalation Daily  . folic acid  1 mg Oral Daily  . heparin  5,000 Units Subcutaneous 3 times per day  . ipratropium-albuterol  3 mL Nebulization TID  . lamoTRIgine  50 mg Oral Daily  . levothyroxine  125 mcg Oral QAC breakfast  . methylPREDNISolone (SOLU-MEDROL) injection  40 mg Intravenous Q6H  . multivitamin with minerals  1 tablet Oral Daily  . pantoprazole  40 mg Oral Daily  . piperacillin-tazobactam (ZOSYN)  IV  2.25 g Intravenous 3 times per day  . sodium chloride flush  3 mL Intravenous Q12H  . thiamine  100 mg Oral Daily  . vitamin C  500 mg Oral BID   Continuous Infusions: . sodium chloride 1 mL/hr (04/15/15 2110)  . sodium chloride 50 mL/hr at 04/14/15 1050    Principal Problem:   Sepsis (HCC) Active Problems:   AKI (acute kidney  injury) (HCC)   Osteomyelitis (HCC)   UTI (urinary tract infection)   Protein-calorie malnutrition, severe (HCC)   Failure to thrive in adult   Abscess of second toe of right foot    Time spent: 40 min    Faatima Tench  [facility] Pager [phone]. If 7PM-7AM, please contact night-coverage at [email or web address], password [facility] 04/16/2015, 7:29 AM  LOS: 3 days

## 2015-04-16 NOTE — Progress Notes (Signed)
Pharmacy Antibiotic Note  Craig [name] is a 70 y.o. male admitted on 04/13/2015 with osteomyelitis.  Pharmacy has been consulted for daptomycin dosing dosing.   Vancomycin random was 147 on 4/1 now down to 146 today. Will continue to hold daptomycin until vancomycin levels trend down significantly. Can assume patient is being actively treated with vancomycin that should cover any G+ organisms.  Plan: -Will hold daptomycin dosing until vancomycin level comes down to sub therapeutic range due to VR of 147 -Will obtain VR level on 04/03 AM -Continue Zosyn 2.25gIV q8h -Monitor for improvements in renal function  Height: 6\' 3"  (190.5 cm) Weight: 153 lb 3.5 oz (69.5 kg) IBW/kg (Calculated) : 84.5  Temp (24hrs), Avg:97.9 F (36.6 C), Min:97.7 F (36.5 C), Max:98.2 F (36.8 C)   Recent Labs Lab 04/13/15 2115 04/13/15 2144 04/13/15 2154 04/14/15 0057 04/14/15 0519 04/14/15 0615 04/14/15 [id number] 04/14/15 0935 04/15/15 1015 04/16/15 0427  WBC 12.6*  --   --   --   --  11.2*  --  10.2 8.5 9.5  CREATININE 8.44*  --   --   --  8.60*  --   --   --  9.74* 10.25*  LATICACIDVEN  --  0.93 1.04 0.40*  --   --   --   --   --   --   VANCORANDOM  --   --   --   --   --   --  147*  --   --  146*    Estimated Creatinine Clearance: 6.8 mL/min (by C-G formula based on Cr of 10.25).    No Known Allergies  Antimicrobials this admission: Vanc pta > 04/01 Zosyn pta >  Dose adjustments this admission: Vancomycin discontinued   Microbiology results: 4/1 blood YQ:IHKVcx:ngtd 4/1 urine QQ:VZDGLcx:yeast, 40k pseudomonas  Thank you for allowing pharmacy to be a part of this patient's care.  Sheppard CoilFrank Haillie Radu PharmD., BCPS Clinical Pharmacist Pager [phone] 04/16/2015 7:23 AM

## 2015-04-16 NOTE — Progress Notes (Signed)
CRITICAL VALUE ALERT  Critical value received:  Vanco Random 146  Date of notification: 04/16/2015  Time of notification: 0532  Critical value read back:Yes.     Nurse who received alert:  A.Charman Blasco,RN  MD notified (1st page): Desma McgregorJames Leford,RPH  Time of first page:  0532  MD notified (2nd page):  Time of second page:  Responding MD: Abran DukeJames Ledford, [facility] LLCRPH   Time MD responded:  [phone]

## 2015-04-16 NOTE — Care Management Note (Signed)
Case Management Note  Patient Details  Name: Craig GuerinWilliam Pyka MRN: [id number] Date of Birth: 1945-02-23  Subjective/Objective:   [facility] and plans to return there when medically stable.                      Expected Discharge Plan:  Skilled Nursing Facility  In-House Referral:  Clinical Social Work  Status of Service:  Completed, signed off  Adien Kimmel, PollockHenrietta T, [place]RN 04/16/2015, 10:31 AM

## 2015-04-16 NOTE — Progress Notes (Signed)
Lab called to report Randon Vanco resulted 146.  Pharmacist Abran DukeJames Ledford was aware of results and informed me that the Vanco had been discontinued and they would continue to monitor the results.

## 2015-04-16 NOTE — Progress Notes (Signed)
S: denies N/V.  Ate breakfast today O:BP 157/56 mmHg  Pulse 58  Temp(Src) 97.5 F (36.4 C) (Oral)  Resp 14  Ht  (1.905 m)  Wt 69.5 kg (153 lb 3.5 oz)  BMI 19.15 kg/m2  SpO2 100%  Intake/Output Summary (Last 24 hours) at 04/16/15 0856 Last data filed at 04/16/15 0600  Gross per 24 hour  Intake   1570 ml  Output    550 ml  Net   1020 ml   Weight change:  ZOX:WRUEA and alert CVS:RRR no rub Resp: Decrease BS bases Abd: + BS NTND Ext: No edema.  Ischemic toes Rt foot NEURO:CNI.  Ox3 today, knows he is in a hospital but not which one.  ? asterixis   . amiodarone  200 mg Oral Daily  . aspirin EC  81 mg Oral Daily  . escitalopram  10 mg Oral Daily  . feeding supplement (ENSURE ENLIVE)  237 mL Oral BID BM  . feeding supplement (PRO-STAT SUGAR FREE 64)  30 mL Oral TID  . ferumoxytol  510 mg Intravenous Weekly  . fluticasone furoate-vilanterol  1 puff Inhalation Daily  . folic acid  1 mg Oral Daily  . heparin  5,000 Units Subcutaneous 3 times per day  . ipratropium-albuterol  3 mL Nebulization TID  . lamoTRIgine  50 mg Oral Daily  . levothyroxine  125 mcg Oral QAC breakfast  . multivitamin with minerals  1 tablet Oral Daily  . pantoprazole  40 mg Oral Daily  . piperacillin-tazobactam (ZOSYN)  IV  2.25 g Intravenous 3 times per day  . predniSONE  40 mg Oral Q breakfast  . sodium chloride flush  3 mL Intravenous Q12H  . thiamine  100 mg Oral Daily  . vitamin C  500 mg Oral BID   US Renal  04/14/2015  CLINICAL DATA:  Acute kidney injury, hypertension, COPD, alcoholic cirrhosis, coronary artery disease post MI EXAM: RENAL / URINARY TRACT ULTRASOUND COMPLETE COMPARISON:  CT abdomen and pelvis 03/01/2015 FINDINGS: Right Kidney: Length: 12.1 cm. Normal cortical thickness. Increased cortical echogenicity. Hypoechoic nodule mid RIGHT kidney 2.0 x 2.0 x 1.9 cm, unchanged from prior CT, consistent with minimally complicated cyst. No additional mass, hydronephrosis or shadowing  calcification. Left Kidney: Length: 11.6 cm. Less well visualized than RIGHT kidney due to body habitus. Normal cortical thickness. Increased cortical echogenicity. No mass, hydronephrosis or shadowing calcification grossly visualized. Bladder: Decompressed by a suprapubic catheter, unable to evaluate IMPRESSION: Echogenic renal parenchyma bilaterally consistent with medical renal disease. Minimally complicated RIGHT renal cyst. No additional mass or hydronephrosis grossly visualized. Electronically Signed   By: Ulyses Southward M.D.   On: 04/14/2015 14:46   Nm Pulmonary Perf And Vent  04/15/2015  CLINICAL DATA:  Hypoxia, COPD, hypertension, coronary artery disease post MI, alcoholic cirrhosis, former smoker EXAM: NUCLEAR MEDICINE VENTILATION - PERFUSION LUNG SCAN TECHNIQUE: Ventilation images were obtained in multiple projections using inhaled aerosol Tc-51m DTPA. Perfusion images were obtained in multiple projections after intravenous injection of Tc-64m MAA. RADIOPHARMACEUTICALS:  30.8 mCi Technetium-48m DTPA aerosol inhalation and 4.38 mCi Technetium-43m MAA IV COMPARISON:  None Radiographic correlation: Chest radiograph 04/15/2015; CT chest 04/26/2013 FINDINGS: Ventilation: Markedly abnormal ventilation exam with small patchy areas of diminished and absent perfusion throughout both lungs. Ventilation is most impaired lower lobes bilaterally Perfusion: Irregular perfusion throughout both lungs. Scattered small subsegmental perfusion defects largest in RIGHT middle lobe. Diminished perfusion in the posterior lungs particularly lower lobes bilaterally matching ventilation pattern. Overall, better perfusion than  ventilation throughout both lungs. Chest radiograph: Bibasilar atelectasis greater on LEFT. Significant bronchiectasis in lower lobes by prior CT. IMPRESSION: Diffuse marked impairment in ventilation throughout both lungs compatible with parenchymal lung disease. Much less severely impaired perfusion in both  lungs with a few small subsegmental perfusion defects. Findings are most suggestive of parenchymal lung disease and represent a low probability for pulmonary embolism. Electronically Signed   By: Ulyses SouthwardMark  Boles M.D.   On: 04/15/2015 11:02   Dg Chest Port 1 View  04/15/2015  CLINICAL DATA:  Shortness of breath and decreased oxygen saturation overnight last night, COPD, hypertension and atrial fibrillation, former smoker, coronary artery disease post MI, alcoholic cirrhosis EXAM: PORTABLE CHEST 1 VIEW COMPARISON:  Portable exam 1040 hours compared to 04/13/2015 and correlated with chest CT 04/26/2013 FINDINGS: RIGHT arm PICC line tip projects over SVC. Normal heart size and pulmonary vascularity. Calcified thoracic aorta. Mild bibasilar atelectasis. Remaining lungs grossly clear. No pleural effusion or pneumothorax. Bronchiectatic changes in the lower lobes on the prior CT from 2015 are not radiographically evident. 2 metallic foreign bodies question BBs project over the mid RIGHT chest. IMPRESSION: Bibasilar atelectasis. Electronically Signed   By: Ulyses SouthwardMark  Boles M.D.   On: 04/15/2015 10:55   BMET    Component Value Date/Time   NA 140 04/16/2015 0427   NA 140 03/14/2015   K 4.2 04/16/2015 0427   CL 103 04/16/2015 0427   CO2 25 04/16/2015 0427   GLUCOSE 121* 04/16/2015 0427   BUN 73* 04/16/2015 0427   BUN 12 03/14/2015   CREATININE 10.25* 04/16/2015 0427   CREATININE 0.9 03/14/2015   CALCIUM 8.4* 04/16/2015 0427   GFRNONAA 5* 04/16/2015 0427   GFRAA 5* 04/16/2015 0427   CBC    Component Value Date/Time   WBC 9.5 04/16/2015 0427   WBC 8.6 03/14/2015   RBC 3.59* 04/16/2015 0427   RBC 3.99* 03/03/2015 0456   HGB 8.2* 04/16/2015 0427   HCT 25.8* 04/16/2015 0427   PLT 248 04/16/2015 0427   MCV 71.9* 04/16/2015 0427   MCH 22.8* 04/16/2015 0427   MCHC 31.8 04/16/2015 0427   RDW 20.2* 04/16/2015 0427   LYMPHSABS 0.7 04/14/2015 0935   MONOABS 0.1 04/14/2015 0935   EOSABS 0.0 04/14/2015 0935    BASOSABS 0.0 04/14/2015 0935     Assessment: 1. ARF ? Secondary to vanco toxicity 2. Yeast UTI 3. Osteomyelitis Rt  foot  Plan: 1. Will try to stimulate UO with lasix 2. He is not a great HD candidate and so will try to hold off as long as possible as expect renal fx to recover. 3. Recheck labs in AM   Yurem Viner T

## 2015-04-17 LAB — RENAL FUNCTION PANEL
Albumin: 1.9 g/dL — ABNORMAL LOW (ref 3.5–5.0)
Anion gap: 14 (ref 5–15)
BUN: 94 mg/dL — AB (ref 6–20)
CHLORIDE: 104 mmol/L (ref 101–111)
CO2: 23 mmol/L (ref 22–32)
Calcium: 8.2 mg/dL — ABNORMAL LOW (ref 8.9–10.3)
Creatinine, Ser: 10.7 mg/dL — ABNORMAL HIGH (ref 0.61–1.24)
GFR calc Af Amer: 5 mL/min — ABNORMAL LOW (ref >60)
GFR calc non Af Amer: 4 mL/min — ABNORMAL LOW (ref >60)
GLUCOSE: 98 mg/dL (ref 65–99)
POTASSIUM: 4.4 mmol/L (ref 3.5–5.1)
Phosphorus: 8.3 mg/dL — ABNORMAL HIGH (ref 2.5–4.6)
Sodium: 141 mmol/L (ref 135–145)

## 2015-04-17 MED ORDER — ENSURE ENLIVE PO LIQD
237.0000 mL | Freq: Two times a day (BID) | ORAL | Status: DC
  Administered 2015-04-18: 237 mL via ORAL

## 2015-04-17 MED ORDER — PREDNISONE 20 MG PO TABS
30.0000 mg | ORAL_TABLET | Freq: Every day | ORAL | Status: DC
  Administered 2015-04-18 – 2015-04-19 (×2): 30 mg via ORAL
  Filled 2015-04-17 (×2): qty 1

## 2015-04-17 MED ORDER — NEPRO/CARBSTEADY PO LIQD
237.0000 mL | Freq: Two times a day (BID) | ORAL | Status: DC
  Administered 2015-04-19: 237 mL via ORAL
  Filled 2015-04-17 (×3): qty 237

## 2015-04-17 MED ORDER — FLUCONAZOLE IN SODIUM CHLORIDE 100-0.9 MG/50ML-% IV SOLN
100.0000 mg | INTRAVENOUS | Status: AC
  Administered 2015-04-18 – 2015-04-27 (×9): 100 mg via INTRAVENOUS
  Filled 2015-04-17 (×11): qty 50

## 2015-04-17 MED ORDER — FLUCONAZOLE IN SODIUM CHLORIDE 200-0.9 MG/100ML-% IV SOLN
200.0000 mg | Freq: Once | INTRAVENOUS | Status: AC
  Administered 2015-04-17: 200 mg via INTRAVENOUS
  Filled 2015-04-17: qty 100

## 2015-04-17 NOTE — Progress Notes (Signed)
Pharmacy Antibiotic Note  Craig GuerinWilliam [name] is a 70 y.o. male admitted on 04/13/2015 with osteomyelitis.  Pharmacy has been consulted for fluconazole dosing dosing.   4/1 urine culture showing yeast and pseudomonas. Fluconazole ordered to start today.  Plan: -Fluconazole 200mg  IV x1 then 100mg  q24 hours -Continue to hold daptomycin dosing until vancomycin level comes down to sub therapeutic range due to VR of 147 -Will obtain VR level on 04/05 AM -Continue Zosyn 2.25gIV q8h for pseudomonas -Monitor for improvements in renal function  Height: 6\' 3"  (190.5 cm) Weight: 153 lb 3.5 oz (69.5 kg) IBW/kg (Calculated) : 84.5  Temp (24hrs), Avg:98 F (36.7 C), Min:97.8 F (36.6 C), Max:98.1 F (36.7 C)   Recent Labs Lab 04/13/15 2115 04/13/15 2144 04/13/15 2154 04/14/15 0057 04/14/15 0519 04/14/15 0615 04/14/15 [id number] 04/14/15 0935 04/15/15 1015 04/16/15 0427 04/17/15 0815  WBC 12.6*  --   --   --   --  11.2*  --  10.2 8.5 9.5  --   CREATININE 8.44*  --   --   --  8.60*  --   --   --  9.74* 10.25* 10.70*  LATICACIDVEN  --  0.93 1.04 0.40*  --   --   --   --   --   --   --   VANCORANDOM  --   --   --   --   --   --  147*  --   --  146*  --     Estimated Creatinine Clearance: 6.5 mL/min (by C-G formula based on Cr of 10.7).    No Known Allergies  Antimicrobials this admission: Vanc pta > 04/01 Zosyn pta > Fluconazole 4/4>>  Dose adjustments this admission: Vancomycin discontinued   Microbiology results: 4/1 blood RU:EAVWcx:ngtd 4/1 urine UJ:WJXBJcx:yeast, 40k pseudomonas - pan sensitive Thank you for allowing pharmacy to be a part of this patient's care.  Sheppard CoilFrank Maddisyn Hegwood PharmD., BCPS Clinical Pharmacist Pager [phone] 04/17/2015 1:51 PM

## 2015-04-17 NOTE — Progress Notes (Addendum)
Nutrition Follow-Up  DOCUMENTATION CODES:   Severe malnutrition in context of chronic illness  INTERVENTION:   -D/c Ensure Enlive po BID, each supplement provides 350 kcal and 20 grams of protein, due to poor acceptance -Continue 30 ml Prostat TID -Continue MVI daily -Nepro Shake po BID, each supplement provides 425 kcal and 19 grams protein  NUTRITION DIAGNOSIS:   Increased nutrient needs related to wound healing as evidenced by estimated needs.  Ongoing  GOAL:   Patient will meet greater than or equal to 90% of their needs  Unmet  MONITOR:   PO intake, Supplement acceptance, Labs, Diet advancement (Direction of care)  REASON FOR ASSESSMENT:   Consult Wound healing  ASSESSMENT:   Craig [name] is a 70 y.o. gentleman with a history of COPD, atrial fibrillation, PVD, malnutrition, and chronic wounds who is a long-term SNF patient (according to his emergency contact Kathlen BrunswickWilliam Coleman, the patient has a sister and brother that have been estranged for the past several years).  Pt sleeping soundly at time of visit. RD unable to arouse.   Reviewed CWOCN note from 04/14/15; pt with rt foot second toe ulceration with osteomyelitis (with necrotic tissue), healed stage IV lt heel pressure injury, and healed sacral pressure injury.   Orthopedics evaluated on 04/14/15; recommending observe wound healing. Noted surgery may result in AKA.   Per vascular lab notes, uplex scan performed on 03/24/15 revealed extensive bilateral atherosclerosis with right > left significant aorta iliac intrarterial vascular disease and significant left mid SFA stenosis.  Palliative care following; pt voices desire to undergo rt toe amputation when medically indicated.   Medications reviewed. Pt receiving folic acid, MVI, vitamin C, and vitamin B-1.   Case discussed with RN. She reports pt with poor oral intake (meal completion 40-100%). Pt consumed only 75% of scrambled eggs off breakfast tray. RN reports  pt has also been refusing Ensure supplement.   Labs reviewed: BUN/Creat: 94/10.7, Phos: 8.3.   Diet Order:  Diet renal with fluid restriction Fluid restriction:: 1200 mL Fluid; Room service appropriate?: Yes; Fluid consistency:: Thin  Skin:  Wound (see comment) (rt foot 2nd toe ulceration w osteomyelitis)  Last BM:  04/16/15  Height:   Ht Readings from Last 1 Encounters:  04/13/15 6\' 3"  (1.905 m)    Weight:   Wt Readings from Last 1 Encounters:  04/16/15 153 lb 3.5 oz (69.5 kg)    Ideal Body Weight:  89.1 kg  BMI:  Body mass index is 19.15 kg/(m^2).  Estimated Nutritional Needs:   Kcal:  2100-2300  Protein:  105-120 grams  Fluid:  per MD  EDUCATION NEEDS:   No education needs identified at this time  Dacie Mandel A. Mayford KnifeWilliams, RD, LDN, CDE Pager: [phone] After hours Pager: [phone]

## 2015-04-17 NOTE — Progress Notes (Signed)
S: denies N/V.  Says he ate breakfast but it hasn't been delivered O:BP 147/59 mmHg  Pulse 59  Temp(Src) 98 F (36.7 C) (Oral)  Resp 10  Ht  (1.905 m)  Wt 69.5 kg (153 lb 3.5 oz)  BMI 19.15 kg/m2  SpO2 98%  Intake/Output Summary (Last 24 hours) at 04/17/15 1044 Last data filed at 04/17/15 0900  Gross per 24 hour  Intake   1832 ml  Output    320 ml  Net   1512 ml   Weight change:  ZOX:WRUEA and alert CVS:RRR no rub Resp: Decrease BS bases Abd: + BS NTND Ext: No edema.  Ischemic toes Rt foot NEURO:CNI.  Ox3 today No asterixis   . amiodarone  200 mg Oral Daily  . aspirin EC  81 mg Oral Daily  . Chlorhexidine Gluconate Cloth  6 each Topical Q0600  . escitalopram  10 mg Oral Daily  . feeding supplement (ENSURE ENLIVE)  237 mL Oral BID BM  . feeding supplement (PRO-STAT SUGAR FREE 64)  30 mL Oral TID  . ferumoxytol  510 mg Intravenous Weekly  . fluticasone furoate-vilanterol  1 puff Inhalation Daily  . folic acid  1 mg Oral Daily  . furosemide  160 mg Intravenous Q8H  . heparin  5,000 Units Subcutaneous 3 times per day  . lamoTRIgine  50 mg Oral Daily  . levothyroxine  125 mcg Oral QAC breakfast  . multivitamin with minerals  1 tablet Oral Daily  . mupirocin ointment  1 application Nasal BID  . pantoprazole  40 mg Oral Daily  . piperacillin-tazobactam (ZOSYN)  IV  2.25 g Intravenous 3 times per day  . predniSONE  40 mg Oral Q breakfast  . sodium chloride flush  3 mL Intravenous Q12H  . thiamine  100 mg Oral Daily  . vitamin C  500 mg Oral BID   Dg Chest Port 1 View  04/15/2015  CLINICAL DATA:  Shortness of breath and decreased oxygen saturation overnight last night, COPD, hypertension and atrial fibrillation, former smoker, coronary artery disease post MI, alcoholic cirrhosis EXAM: PORTABLE CHEST 1 VIEW COMPARISON:  Portable exam 1040 hours compared to 04/13/2015 and correlated with chest CT 04/26/2013 FINDINGS: RIGHT arm PICC line tip projects over SVC. Normal heart  size and pulmonary vascularity. Calcified thoracic aorta. Mild bibasilar atelectasis. Remaining lungs grossly clear. No pleural effusion or pneumothorax. Bronchiectatic changes in the lower lobes on the prior CT from 2015 are not radiographically evident. 2 metallic foreign bodies question BBs project over the mid RIGHT chest. IMPRESSION: Bibasilar atelectasis. Electronically Signed   By: Ulyses Southward M.D.   On: 04/15/2015 10:55   BMET    Component Value Date/Time   NA 141 04/17/2015 0815   NA 140 03/14/2015   K 4.4 04/17/2015 0815   CL 104 04/17/2015 0815   CO2 23 04/17/2015 0815   GLUCOSE 98 04/17/2015 0815   BUN 94* 04/17/2015 0815   BUN 12 03/14/2015   CREATININE 10.70* 04/17/2015 0815   CREATININE 0.9 03/14/2015   CALCIUM 8.2* 04/17/2015 0815   GFRNONAA 4* 04/17/2015 0815   GFRAA 5* 04/17/2015 0815   CBC    Component Value Date/Time   WBC 9.5 04/16/2015 0427   WBC 8.6 03/14/2015   RBC 3.59* 04/16/2015 0427   RBC 3.99* 03/03/2015 0456   HGB 8.2* 04/16/2015 0427   HCT 25.8* 04/16/2015 0427   PLT 248 04/16/2015 0427   MCV 71.9* 04/16/2015 0427   MCH 22.8* 04/16/2015  0427   MCHC 31.8 04/16/2015 0427   RDW 20.2* 04/16/2015 0427   LYMPHSABS 0.7 04/14/2015 0935   MONOABS 0.1 04/14/2015 0935   EOSABS 0.0 04/14/2015 0935   BASOSABS 0.0 04/14/2015 0935     Assessment: 1. ARF ? Secondary to vanco toxicity.  UO improving (as of this AM) and rate of rise of Scr less 2. Yeast UTI 3. Osteomyelitis Rt  foot  Plan: 1. No need for HD yet and since anticipate recovery would like to hold off as long as possible.  I still cannot get a direct answer regarding whether he would do AKA if needed. 2. Cont lasix 3. Recheck labs in AM 4. I would consider treating his yeast UTI  Craig [name] T

## 2015-04-17 NOTE — Progress Notes (Addendum)
[facility] PROGRESS NOTE  Ryker Sudbury ZOX:[id number] DOB: 25-Jun-1945 DOA: 04/13/2015 PCP: Kirt Boys, DO  Interim Summary Mr Schoolfield is a 70 year old gentleman with past medical history of peripheral vascular disease, chronic right toe ulcer, nonambulatory at baseline having poor functional status, severe protein malnutrition, who was seen by Dr Imogene Burn of vascular surgery on 03/30/2015. He was found to have frank pus draining from right second toe as it was recommended to him then that he be admitted to the hospital and evaluated by orthopedic surgery for amputation. It appears that the time he refused to go to the emergency department. He has been treated with IV vancomycin and Zosyn in the outpatient setting.  He was admitted to the medicine service on 04/14/2015 presenting as a transfer from his skilled nursing facility for further evaluation of acute encephalopathy. On admission he had sepsis criteria with his source of infection likely to be from osteomyelitis.  X-rays of his right foot showed lytic destruction of the second proximal phalanx consistent with acute osteomyelitis. Labs also revealed acute kidney injury with creatinine of 8.6, with previous lab work showing creatinine of 0.9 on 03/14/2015. Other issues include respiratory acidosis. An ABG revealed a pH of 7.198 with PCO2 of 63 and PaO2 of 73. Chest x-ray did not show acute infiltrate, VQ scan showed low probability for pulmonary embolism. With regard to acute osteomyelitis,  Dr Lajoyce Corners evaluated Mr. Warnell and for now recommended treatment with IV antibiotic therapy holding off on surgical intervention. He also stated that he if surgery needed to be done would likely require a above-the-knee amputation. Vancomycin had been discontinued on admission due to supratherapeutic levels. Nephrology recommended to daptomycin. Zosyn was continued.  Regards to acute kidney injury nephrology was consulted. His creatinine has trended up from  8.44 on admission to 10.7 on 04/17/2015. Nephrology felt that this could be related to vancomycin-induced nephrotoxicity having vancomycin levels of 146. Bilateral renal ultrasound negative for hydronephrosis. Critical illness/sepsis, hypovolemia may also have contributed. Labs have showed stable potassium with bicarbonate of 25. By 04/17/2015 he remained hemodynamically stable with improvement to mental status changes. Orders for transfer to telemetry were placed.    Assessment/Plan: 1. Sepsis -Present on admission, evidenced by acute encephalopathy, white count of 12,600, acute kidney injury with creatinine of 8.4 with source of infection likely secondary to osteomyelitis involving second proximal phalanx of right foot. -In the outpatient setting amputation had been recommended however patient refused this intervention. -He had been treated with IV vancomycin and Zosyn through PICC line. -Nephrology consulted regarding renal failure recommended discontinuation of IV vancomycin suspecting vancomycin-induced nephrotoxicity.  -On Daptomycin as recommended by nephrology -Remains afebrile, blood pressure stable. Blood cultures drawn 04/14/2015 showing no growth to date. -By 04/17/2015 patient showing some improvement. He has been afebrile and hemodynamically stable.  2.  Acute hypoxemic hypercarbic respiratory failure -Evidenced by a pH of 7.14 with PCO2 of 78, presenting with respiratory distress and acute encephalopathy. -Chest x-ray performed in the emergency department did not reveal acute infiltrate. -ABG revealing respiratory acidosis -V/Q scan performed on 04/15/2015 showed low probability for pulmonary embolism -He is a DNR -Showing improvement from a respiratory standpoint.  3. Acute kidney injury secondary to vancomycin induced nephrotoxicity. -Lab work revealing a creatinine of 8.6 on admission, increased from 0.9 from labs drawn on 03/14/2015. -Acute kidney injury likely related  tovancomycin-induced nephrotoxicity, other factors which may have contributed include sepsis/critical illness and hypovolemia.  -IV Vancomycin discontinued -Nephrology consulted.  -Repeat labs showing Vanc level  of 146. Creatinine increased to 10.25 from 8.6 on admission. Potassium stable at 4.2 and bicarb at 25. Mental status changes improved.  -Unsure if he would be a good candidate for renal replacement therapy.  -Nephrology started Lasix 160 mg IV every 8 hours; having urinary output of 220 mL in the last 24 hours  4.  Acute encephalopathy. -Mr Parthasarathy at baseline having a poor functional status, resident at SNF, having multiple comorbidities presenting with mental status changes -I suspect acute encephalopathy related to multiple factors including sepsis/infection, acute renal failure, hypoxemic hypercarbic respiratory failure, dehydration.  -Improved. At this point I think he has capacity to make his own medical decisions.    5.  Acute osteomyelitis. -Mr. Trowbridge having history of ulceration involving second toe of right foot, had been seen recently in the office by vascular surgery at which time they recommended hospitalization for amputation. Patient at that time refused hospitalization and was treated with IV antibiotics. -X-ray performed in the emergency department showing lytic destruction of the second proximal phalanx consistent with acute osteomyelitis. -Mr. Roza was seen and evaluated by Dr.Duda of orthopedic surgery of 04/14/2015 who recommended continuing medical management for now. If he requires surgery would likely be above-the-knee amputation -Continue empiric antimicrobial therapy with daptomycin and Zosyn  6.  Urinary tract infection. -Urinalysis showed the presence of many bacteria, nitrates and leukocytes. -He had been on broad-spectrum IV antibiotic coverage with Zosyn and IV vancomycin. -Urine culture growing Pseudomonas, currently on Zosyn. Susceptibility testing  showing organism susceptible to Zosyn. -Nephrology recommended treating yeast, started IV Diflucan  7.  Anemia -Labs showed Hg of 6.4, not having clinical evidence of active bleed. Suspect related to multiple factors such as iron deficiency, renal failure, sepsis/critical illness.  -He was transfused with 2 units of PRBC's on 04/15/2015 -Follow repeat CBC  8.  Chronic obstructive pulmonary disease -He presented with hypoxemic hypercarbic respiratory failure and was started on systemic steroids in the lungs or treatments. -On a steroid taper, decrease prednisone dose to 30 mg by mouth daily today  9.  Severe protein calorie malnutrition -Patient having a a pre-albumin level of 7.0. At baseline has a poor functional status -Right protein boost  10.  Medical goals of care. -Overall anything his prognosis is poor. At baseline he has poor functional status, wheelchair bound, having multiple medical conditions including severe peripheral vascular disease. He is presenting with sepsis, acute osteomyelitis involving second digit of right foot. Toe amputation has been recommended in the past by his surgeons however at that time he has refused to undergo this procedure. I spoke to Mr Bucky Grigg who is his caregiver. [name] states that Mr. Goodwine has not had Medication with family members about 20 years. He has a sister that lives in [place], and he has taken over caregiving responsibilities. He confirms patient's DO NOT RESUSCITATE status.  -I spoke to Mr. Earnest on 04/15/2015. Acute encephalopathy improved although I think he still has some confusion. We discussed his medical conditions and goals of care. He would like to treat what is treatable, including undergoing surgical intervention if this was recommended. -Palliative care consulted to facilitate further goals of care discussion.  Code Status: DO NOT RESUSCITATE Family Communication:  Disposition Plan: Transfer to  telemetry   Consultants:  Nephrology  Orthopedic surgery   Antibiotics: Zosyn Daptomycin  HPI/Subjective: He is awake and alert, states feeling better today. Appears more interactive  Objective: Filed Vitals:   04/17/15 0800 04/17/15 1209  BP:  147/59 148/62  Pulse: 59 62  Temp:  97.8 F (36.6 C)  Resp: 10 11    Intake/Output Summary (Last 24 hours) at 04/17/15 1324 Last data filed at 04/17/15 1300  Gross per 24 hour  Intake   1666 ml  Output    405 ml  Net   1261 ml   Filed Weights   04/13/15 1732 04/16/15 0600  Weight: 72.576 kg (160 lb) 69.5 kg (153 lb 3.5 oz)    Exam:   General:  He is ill-appearing, cachectic, wasted, Now on nasal cannula oxygen, more awake and alert today.  Cardiovascular: Tachycardic regular rate rhythm normal S1-S2  Respiratory: Improve lung exam, good air movement, om [place] supplemental oxygen  Abdomen: Status post colostomy placement  Musculoskeletal: Severe muscle atrophy, there is ulceration/dry gangrene over 2nd toe of right foot.   Data Reviewed: Basic Metabolic Panel:  Recent Labs Lab 04/13/15 2115 04/14/15 0519 04/14/15 0935 04/15/15 1015 04/16/15 0427 04/17/15 0815  NA 141 143  --  142 140 141  K 4.3 4.5  --  3.9 4.2 4.4  CL 103 103  --  102 103 104  CO2 25 29  --  GLUCOSE 74 72  --  119* 121* 98  BUN 40* 40*  --  60* 73* 94*  CREATININE 8.44* 8.60*  --  9.74* 10.25* 10.70*  CALCIUM 9.0 8.6*  --  8.5* 8.4* 8.2*  PHOS  --   --  8.0*  --   --  8.3*   Liver Function Tests:  Recent Labs Lab 04/13/15 2115 04/14/15 0519 04/15/15 1015 04/17/15 0815  AST 11* 12* 10*  --   ALT 9* 9* 7*  --   ALKPHOS 47 40 35*  --   BILITOT 1.3* 1.2 1.1  --   PROT 8.0 7.0 7.0  --   ALBUMIN 2.3* 2.0* 2.0* 1.9*   No results for input(s): LIPASE, AMYLASE in the last 168 hours. No results for input(s): AMMONIA in the last 168 hours. CBC:  Recent Labs Lab 04/13/15 2115 04/14/15 0615 04/14/15 0935 04/15/15 1015  04/16/15 0427  WBC 12.6* 11.2* 10.2 8.5 9.5  NEUTROABS 9.3*  --  9.3*  --   --   HGB 8.5* 7.5* 7.3* 6.4* 8.2*  HCT 27.2* 26.4* 25.6* 21.3* 25.8*  MCV 71.6* 71.9* 71.9* 69.2* 71.9*  PLT 309 250 287 261 248   Cardiac Enzymes:  Recent Labs Lab 04/14/15 0935 04/14/15 1546  TROPONINI 0.03 <0.03   BNP (last 3 results) No results for input(s): BNP in the last 8760 hours.  ProBNP (last 3 results) No results for input(s): PROBNP in the last 8760 hours.  CBG: No results for input(s): GLUCAP in the last 168 hours.  Recent Results (from the past 240 hour(s))  Culture, blood (Routine X 2) w Reflex to ID Panel     Status: None (Preliminary result)   Collection Time: 04/14/15  4:11 AM  Result Value Ref Range Status   Specimen Description BLOOD LEFT ARM  Final   Special Requests BOTTLES DRAWN AEROBIC ONLY  Final   Culture NO GROWTH 2 DAYS  Final   Report Status PENDING  Incomplete  Culture, blood (Routine X 2) w Reflex to ID Panel     Status: None (Preliminary result)   Collection Time: 04/14/15  4:16 AM  Result Value Ref Range Status   Specimen Description BLOOD LEFT HAND  Final   Special Requests BOTTLES DRAWN AEROBIC ONLY  Final   Culture NO GROWTH 2 DAYS  Final   Report Status PENDING  Incomplete  Urine culture     Status: None   Collection Time: 04/14/15  5:16 AM  Result Value Ref Range Status   Specimen Description URINE, SUPRAPUBIC  Final   Special Requests NONE  Final   Culture   Final    >=100,000 COLONIES/mL YEAST 40,000 COLONIES/ml PSEUDOMONAS AERUGINOSA    Report Status 04/16/2015 FINAL  Final   Organism ID, Bacteria PSEUDOMONAS AERUGINOSA  Final      Susceptibility   Pseudomonas aeruginosa - MIC*    CEFTAZIDIME 4 SENSITIVE Sensitive     CIPROFLOXACIN <=0.25 SENSITIVE Sensitive     GENTAMICIN 4 SENSITIVE Sensitive     IMIPENEM <=0.25 SENSITIVE Sensitive     PIP/TAZO <=4 SENSITIVE Sensitive     CEFEPIME 4 SENSITIVE Sensitive     * 40,000 COLONIES/ml  PSEUDOMONAS AERUGINOSA  MRSA PCR Screening     Status: Abnormal   Collection Time: 04/14/15 10:03 AM  Result Value Ref Range Status   MRSA by PCR POSITIVE (A) NEGATIVE Final    Comment:        The GeneXpert MRSA Assay (FDA approved for NASAL specimens only), is one component of a comprehensive MRSA colonization surveillance program. It is not intended to diagnose MRSA infection nor to guide or monitor treatment for MRSA infections. RESULT CALLED TO, READ BACK BY AND VERIFIED WITH: RN Sherlean FootJ CLEAVER AT 1220 [id number] MARTINB      Studies: No results found.  Scheduled Meds: . amiodarone  200 mg Oral Daily  . aspirin EC  81 mg Oral Daily  . Chlorhexidine Gluconate Cloth  6 each Topical Q0600  . escitalopram  10 mg Oral Daily  . feeding supplement (ENSURE ENLIVE)  237 mL Oral BID BM  . feeding supplement (PRO-STAT SUGAR FREE 64)  30 mL Oral TID  . ferumoxytol  510 mg Intravenous Weekly  . fluticasone furoate-vilanterol  1 puff Inhalation Daily  . folic acid  1 mg Oral Daily  . furosemide  160 mg Intravenous Q8H  . heparin  5,000 Units Subcutaneous 3 times per day  . lamoTRIgine  50 mg Oral Daily  . levothyroxine  125 mcg Oral QAC breakfast  . multivitamin with minerals  1 tablet Oral Daily  . mupirocin ointment  1 application Nasal BID  . pantoprazole  40 mg Oral Daily  . piperacillin-tazobactam (ZOSYN)  IV  2.25 g Intravenous 3 times per day  . [START ON 04/18/2015] predniSONE  30 mg Oral Q breakfast  . sodium chloride flush  3 mL Intravenous Q12H  . thiamine  100 mg Oral Daily  . vitamin C  500 mg Oral BID   Continuous Infusions: . sodium chloride 1 mL/hr (04/15/15 2110)  . sodium chloride 50 mL/hr at 04/14/15 1050    Principal Problem:   Sepsis (HCC) Active Problems:   AKI (acute kidney injury) (HCC)   Osteomyelitis (HCC)   UTI (urinary tract infection)   Protein-calorie malnutrition, severe (HCC)   Failure to thrive in adult   Abscess of second toe of right  foot    Time spent: 35 min    Sierra Bissonette  [facility] Pager [phone]. If 7PM-7AM, please contact night-coverage at [email or web address], password [email or web address] 04/17/2015, 1:24 PM  LOS: 4 days

## 2015-04-18 LAB — BASIC METABOLIC PANEL
Anion gap: 14 (ref 5–15)
BUN: 121 mg/dL — AB (ref 6–20)
CALCIUM: 8.5 mg/dL — AB (ref 8.9–10.3)
CO2: 22 mmol/L (ref 22–32)
CREATININE: 11.48 mg/dL — AB (ref 0.61–1.24)
Chloride: 104 mmol/L (ref 101–111)
GFR calc non Af Amer: 4 mL/min — ABNORMAL LOW (ref >60)
GFR, EST AFRICAN AMERICAN: 5 mL/min — AB (ref >60)
Glucose, Bld: 93 mg/dL (ref 65–99)
Potassium: 4.4 mmol/L (ref 3.5–5.1)
SODIUM: 140 mmol/L (ref 135–145)

## 2015-04-18 LAB — CBC
HCT: 27.5 % — ABNORMAL LOW (ref 39.0–52.0)
Hemoglobin: 8.7 g/dL — ABNORMAL LOW (ref 13.0–17.0)
MCH: 22.8 pg — ABNORMAL LOW (ref 26.0–34.0)
MCHC: 31.6 g/dL (ref 30.0–36.0)
MCV: 72 fL — ABNORMAL LOW (ref 78.0–100.0)
PLATELETS: 267 10*3/uL (ref 150–400)
RBC: 3.82 MIL/uL — AB (ref 4.22–5.81)
RDW: 21.5 % — AB (ref 11.5–15.5)
WBC: 12.7 10*3/uL — AB (ref 4.0–10.5)

## 2015-04-18 LAB — VANCOMYCIN, RANDOM: VANCOMYCIN RM: 115 ug/mL — AB

## 2015-04-18 NOTE — Progress Notes (Signed)
S: denies N/V.  Says he is eating O:BP 181/59 mmHg  Pulse 64  Temp(Src) 98.7 F (37.1 C) (Oral)  Resp 18  Ht 6\' 3"  (1.905 m)  Wt 84.3 kg (185 lb 13.6 oz)  BMI 23.23 kg/m2  SpO2 100%  Intake/Output Summary (Last 24 hours) at 04/18/15 1350 Last data filed at 04/18/15 0900  Gross per 24 hour  Intake    536 ml  Output    100 ml  Net    436 ml   Weight change:  ZOX:WRUEAGen:awake and alert CVS:RRR no rub Resp: Decrease BS bases Abd: + BS NTND  + colostomy Ext: No edema.  Ischemic toes Rt foot NEURO:CNI.  Ox3 today  Mild asterixis GU: suprapubic cath   . amiodarone  200 mg Oral Daily  . aspirin EC  81 mg Oral Daily  . Chlorhexidine Gluconate Cloth  6 each Topical Q0600  . escitalopram  10 mg Oral Daily  . feeding supplement (ENSURE ENLIVE)  237 mL Oral BID BM  . feeding supplement (NEPRO CARB STEADY)  237 mL Oral BID BM  . feeding supplement (PRO-STAT SUGAR FREE 64)  30 mL Oral TID  . ferumoxytol  510 mg Intravenous Weekly  . fluconazole (DIFLUCAN) IV  100 mg Intravenous Q24H  . fluticasone furoate-vilanterol  1 puff Inhalation Daily  . folic acid  1 mg Oral Daily  . furosemide  160 mg Intravenous Q8H  . heparin  5,000 Units Subcutaneous 3 times per day  . lamoTRIgine  50 mg Oral Daily  . levothyroxine  125 mcg Oral QAC breakfast  . multivitamin with minerals  1 tablet Oral Daily  . mupirocin ointment  1 application Nasal BID  . pantoprazole  40 mg Oral Daily  . piperacillin-tazobactam (ZOSYN)  IV  2.25 g Intravenous 3 times per day  . predniSONE  30 mg Oral Q breakfast  . sodium chloride flush  3 mL Intravenous Q12H  . thiamine  100 mg Oral Daily  . vitamin C  500 mg Oral BID   No results found. BMET    Component Value Date/Time   NA 140 04/18/2015 1010   NA 140 03/14/2015   K 4.4 04/18/2015 1010   CL 104 04/18/2015 1010   CO2 22 04/18/2015 1010   GLUCOSE 93 04/18/2015 1010   BUN 121* 04/18/2015 1010   BUN 12 03/14/2015   CREATININE 11.48* 04/18/2015 1010   CREATININE 0.9 03/14/2015   CALCIUM 8.5* 04/18/2015 1010   GFRNONAA 4* 04/18/2015 1010   GFRAA 5* 04/18/2015 1010   CBC    Component Value Date/Time   WBC 12.7* 04/18/2015 1010   WBC 8.6 03/14/2015   RBC 3.82* 04/18/2015 1010   RBC 3.99* 03/03/2015 0456   HGB 8.7* 04/18/2015 1010   HCT 27.5* 04/18/2015 1010   PLT 267 04/18/2015 1010   MCV 72.0* 04/18/2015 1010   MCH 22.8* 04/18/2015 1010   MCHC 31.6 04/18/2015 1010   RDW 21.5* 04/18/2015 1010   LYMPHSABS 0.7 04/14/2015 0935   MONOABS 0.1 04/14/2015 0935   EOSABS 0.0 04/14/2015 0935   BASOSABS 0.0 04/14/2015 0935     Assessment: 1. ARF ? Secondary to vanco toxicity.  UO not well recorded but fair about of dilute urine in foley bag 2. Osteomyelitis Rt  foot  Plan: 1. My plan was to do HD as BUN/Cr higher and UO poorly recorded but there is fair amt of dilute urine in bag and he denies uremic sxs.  He says he  would do HD if needed but not today?  Says he will do tomorrow if needed?  Will re address tomorrow if Scr  higher.  HD would only be for short term as anticipate renal recovery as he is not a long term candidate. 2. Recheck labs in AM Mandel Seiden T

## 2015-04-18 NOTE — Progress Notes (Signed)
CRITICAL VALUE ALERT  Critical value received:  Vancomycin 115  Date of notification:  04/18/15  Time of notification:  1300  Critical value read back:Yes.    Nurse who received alert:  Leory PlowmanK. Nakeda Lebron,RN   MD notified (1st page):  Dr. Malachi BondsShort  Time of first page:  04/18/15

## 2015-04-18 NOTE — Progress Notes (Signed)
[facility] PROGRESS NOTE  Craig [name] ZOX:[id number] DOB: 11-25-1945 DOA: 04/13/2015 PCP: Kirt Boys, DO  Brief Summary  Craig [name] is a 70 year old gentleman with past medical history of peripheral vascular disease, chronic right toe ulcer, nonambulatory at baseline having poor functional status, severe protein malnutrition, who was seen by Dr Imogene Burn of vascular surgery on 03/30/2015. He was found to have frank pus draining from right second toe as it was recommended to him then that he be admitted to the hospital and evaluated by orthopedic surgery for amputation. It appears that the time he refused to go to the emergency department. He has been treated with IV vancomycin and Zosyn in the outpatient setting.  He was admitted to the medicine service on 04/14/2015 presenting as a transfer from his skilled nursing facility for further evaluation of acute encephalopathy. On admission he had sepsis criteria with his source of infection likely to be from osteomyelitis. X-rays of his right foot showed lytic destruction of the second proximal phalanx consistent with acute osteomyelitis. Labs also revealed acute kidney injury with creatinine of 8.6, with previous lab work showing creatinine of 0.9 on 03/14/2015. Other issues include respiratory acidosis. An ABG revealed a pH of 7.198 with PCO2 of 63 and PaO2 of 73. Chest x-ray did not show acute infiltrate, VQ scan showed low probability for pulmonary embolism. With regard to acute osteomyelitis, Dr Lajoyce Corners evaluated Craig [name] and for now recommended treatment with IV antibiotic therapy holding off on surgical intervention. He also stated that he if surgery needed to be done would likely require a above-the-knee amputation. Vancomycin had been discontinued on admission due to supratherapeutic levels. Nephrology recommended to daptomycin. Zosyn was continued.  Regards to acute kidney injury nephrology was consulted. His creatinine has trended up  from 8.44 on admission to 10.7 on 04/17/2015. Nephrology felt that this could be related to vancomycin-induced nephrotoxicity having vancomycin levels of 146. Bilateral renal ultrasound negative for hydronephrosis. Critical illness/sepsis, hypovolemia may also have contributed. Labs have showed stable potassium with bicarbonate of 25. By 04/17/2015 he remained hemodynamically stable with improvement to mental status changes. Orders for transfer to telemetry were placed.    Assessment/Plan  1. Sepsis -Present on admission, evidenced by acute encephalopathy, white count of 12,600, acute kidney injury with creatinine of 8.4 with source of infection likely secondary to osteomyelitis involving second proximal phalanx of right foot. -In the outpatient setting amputation had been recommended however patient refused this intervention. -He had been treated with IV vancomycin and Zosyn through PICC line. -Nephrology consulted regarding renal failure recommended discontinuation of IV vancomycin suspecting vancomycin-induced nephrotoxicity.  -does not need Daptomycin currently since vancomycin level still very supratherapeutic -Remains afebrile, blood pressure stable. Blood cultures drawn 04/14/2015 showing no growth to date. -By 04/17/2015 patient showing some improvement. He has been afebrile and hemodynamically stable.  2. Acute hypoxemic hypercarbic respiratory failure -Evidenced by a pH of 7.14 with PCO2 of 78, presenting with respiratory distress and acute encephalopathy. -Chest x-ray performed in the emergency department did not reveal acute infiltrate. -ABG revealing respiratory acidosis -V/Q scan performed on 04/15/2015 showed low probability for pulmonary embolism -He is a DNR -Showing improvement from a respiratory standpoint.  3. Acute kidney injury secondary to vancomycin induced nephrotoxicity. -Lab work revealing a creatinine of 8.6 on admission, increased from 0.9 from labs drawn on  03/14/2015. -Acute kidney injury likely related to vancomycin-induced nephrotoxicity, other factors which may have contributed include sepsis/critical illness and hypovolemia.  -IV Vancomycin discontinued -Nephrology consulted.  -  creatinine continuing to trend up -not a good candidate for long term HD -  Appears that we are giving IVF and lasix to attempt to flush his kidneys  4. Acute encephalopathy, likely due to sepsis, AKI and now likely to develop uremic symptoms  5. Acute osteomyelitis. -Craig [name] having history of ulceration involving second toe of right foot, had been seen recently in the office by vascular surgery at which time they recommended hospitalization for amputation. Patient at that time refused hospitalization and was treated with IV antibiotics. -X-ray performed in the emergency department showing lytic destruction of the second proximal phalanx consistent with acute osteomyelitis. -Craig [name] was seen and evaluated by Dr.Duda of orthopedic surgery of 04/14/2015 who recommended continuing medical management for now. If he requires surgery would likely be above-the-knee amputation -Continue empiric antimicrobial therapy with Zosyn.  Vancomycin still supratherapeutic  6. Urinary tract infection. -Urinalysis showed the presence of many bacteria, nitrates and leukocytes. -He had been on broad-spectrum IV antibiotic coverage with Zosyn and IV vancomycin. -Urine culture growing Pseudomonas, currently on Zosyn. Susceptibility testing showing organism susceptible to Zosyn. -Nephrology recommended treating yeast, started IV Diflucan  7. Anemia -Labs showed Hg of 6.4, not having clinical evidence of active bleed. Suspect related to multiple factors such as iron deficiency, renal failure, sepsis/critical illness.  -He was transfused with 2 units of PRBC's on 04/15/2015 -Follow repeat CBC  8. Chronic obstructive pulmonary disease -He presented with hypoxemic hypercarbic  respiratory failure and was started on systemic steroids in the lungs or treatments. -On a steroid taper, decrease prednisone dose to 30 mg by mouth daily today  9. Severe protein calorie malnutrition -Patient having a a pre-albumin level of 7.0. At baseline has a poor functional status -Right protein boost  10. Medical goals of care. -Palliative care consult pending  Diet:  renal Access:  PIV IVF:  yes Proph:  heparin  Code Status: DNR Family Communication: patient alone Disposition Plan:  Likely to need HD or full palliation in next day or two.    Consultants:  Nephrology  Procedures:  none  Antibiotics:  Vancomycin ongoing due to elevated levels  Zosyn   Fluconazole    HPI/Subjective:  Denies foot pain, SOB, nausea, vomiting, hiccoughs, and poor appetite.  Unsure how much urine he is making.    Objective: Filed Vitals:   04/17/15 2034 04/17/15 2315 04/18/15 0947 04/18/15 1700  BP: 154/56 171/59 181/59 166/56  Pulse: 68 67 64 68  Temp: 98.2 F (36.8 C) 97.8 F (36.6 C) 98.7 F (37.1 C) 98.6 F (37 C)  TempSrc: Oral Oral Oral Oral  Resp: 12 16 18 16   Height:      Weight:  84.3 kg (185 lb 13.6 oz)    SpO2: 95% 96% 100% 98%    Intake/Output Summary (Last 24 hours) at 04/18/15 1729 Last data filed at 04/18/15 1300  Gross per 24 hour  Intake    556 ml  Output    250 ml  Net    306 ml   Filed Weights   04/13/15 1732 04/16/15 0600 04/17/15 2315  Weight: 72.576 kg (160 lb) 69.5 kg (153 lb 3.5 oz) 84.3 kg (185 lb 13.6 oz)   Body mass index is 23.23 kg/(m^2).  Exam:   General:  Adult male, No acute distress  HEENT:  NCAT, MMM  Cardiovascular:  RRR, nl S1, S2 no mrg, 2+ pulses, warm extremities  Respiratory:  CTAB, no increased WOB  Abdomen:   NABS, soft, NT/ND  MSK:   Severe muscle wasting of bilateral lower extremities with smooth skin and trace amount of slow pitting edema.  Diminished pulses bilaterally.  Right foot with dried second  toe.    Neuro:  Grossly moves all extremities and able to move legs a little bit to assist with exam.   Data Reviewed: Basic Metabolic Panel:  Recent Labs Lab 04/14/15 0519 04/14/15 0935 04/15/15 1015 04/16/15 0427 04/17/15 0815 04/18/15 1010  NA 143  --  142 140 141 140  K 4.5  --  3.9 4.2 4.4 4.4  CL 103  --  102 103 104 104  CO2 29  --  GLUCOSE 72  --  119* 121* 98 93  BUN 40*  --  60* 73* 94* 121*  CREATININE 8.60*  --  9.74* 10.25* 10.70* 11.48*  CALCIUM 8.6*  --  8.5* 8.4* 8.2* 8.5*  PHOS  --  8.0*  --   --  8.3*  --    Liver Function Tests:  Recent Labs Lab 04/13/15 2115 04/14/15 0519 04/15/15 1015 04/17/15 0815  AST 11* 12* 10*  --   ALT 9* 9* 7*  --   ALKPHOS 47 40 35*  --   BILITOT 1.3* 1.2 1.1  --   PROT 8.0 7.0 7.0  --   ALBUMIN 2.3* 2.0* 2.0* 1.9*   No results for input(s): LIPASE, AMYLASE in the last 168 hours. No results for input(s): AMMONIA in the last 168 hours. CBC:  Recent Labs Lab 04/13/15 2115 04/14/15 0615 04/14/15 0935 04/15/15 1015 04/16/15 0427 04/18/15 1010  WBC 12.6* 11.2* 10.2 8.5 9.5 12.7*  NEUTROABS 9.3*  --  9.3*  --   --   --   HGB 8.5* 7.5* 7.3* 6.4* 8.2* 8.7*  HCT 27.2* 26.4* 25.6* 21.3* 25.8* 27.5*  MCV 71.6* 71.9* 71.9* 69.2* 71.9* 72.0*  PLT 309 250 287 261 248 267    Recent Results (from the past 240 hour(s))  Culture, blood (Routine X 2) w Reflex to ID Panel     Status: None (Preliminary result)   Collection Time: 04/14/15  4:11 AM  Result Value Ref Range Status   Specimen Description BLOOD LEFT ARM  Final   Special Requests BOTTLES DRAWN AEROBIC ONLY  Final   Culture NO GROWTH 4 DAYS  Final   Report Status PENDING  Incomplete  Culture, blood (Routine X 2) w Reflex to ID Panel     Status: None (Preliminary result)   Collection Time: 04/14/15  4:16 AM  Result Value Ref Range Status   Specimen Description BLOOD LEFT HAND  Final   Special Requests BOTTLES DRAWN AEROBIC ONLY  Final    Culture NO GROWTH 4 DAYS  Final   Report Status PENDING  Incomplete  Urine culture     Status: None   Collection Time: 04/14/15  5:16 AM  Result Value Ref Range Status   Specimen Description URINE, SUPRAPUBIC  Final   Special Requests NONE  Final   Culture   Final    >=100,000 COLONIES/mL YEAST 40,000 COLONIES/ml PSEUDOMONAS AERUGINOSA    Report Status 04/16/2015 FINAL  Final   Organism ID, Bacteria PSEUDOMONAS AERUGINOSA  Final      Susceptibility   Pseudomonas aeruginosa - MIC*    CEFTAZIDIME 4 SENSITIVE Sensitive     CIPROFLOXACIN <=0.25 SENSITIVE Sensitive     GENTAMICIN 4 SENSITIVE Sensitive     IMIPENEM <=0.25 SENSITIVE Sensitive     PIP/TAZO <=  4 SENSITIVE Sensitive     CEFEPIME 4 SENSITIVE Sensitive     * 40,000 COLONIES/ml PSEUDOMONAS AERUGINOSA  MRSA PCR Screening     Status: Abnormal   Collection Time: 04/14/15 10:03 AM  Result Value Ref Range Status   MRSA by PCR POSITIVE (A) NEGATIVE Final    Comment:        The GeneXpert MRSA Assay (FDA approved for NASAL specimens only), is one component of a comprehensive MRSA colonization surveillance program. It is not intended to diagnose MRSA infection nor to guide or monitor treatment for MRSA infections. RESULT CALLED TO, READ BACK BY AND VERIFIED WITH: RN Sherlean FootJ CLEAVER AT 1220 [id number] MARTINB      Studies: No results found.  Scheduled Meds: . amiodarone  200 mg Oral Daily  . aspirin EC  81 mg Oral Daily  . Chlorhexidine Gluconate Cloth  6 each Topical Q0600  . escitalopram  10 mg Oral Daily  . feeding supplement (ENSURE ENLIVE)  237 mL Oral BID BM  . feeding supplement (NEPRO CARB STEADY)  237 mL Oral BID BM  . feeding supplement (PRO-STAT SUGAR FREE 64)  30 mL Oral TID  . ferumoxytol  510 mg Intravenous Weekly  . fluconazole (DIFLUCAN) IV  100 mg Intravenous Q24H  . fluticasone furoate-vilanterol  1 puff Inhalation Daily  . folic acid  1 mg Oral Daily  . furosemide  160 mg Intravenous Q8H  . heparin  5,000  Units Subcutaneous 3 times per day  . lamoTRIgine  50 mg Oral Daily  . levothyroxine  125 mcg Oral QAC breakfast  . multivitamin with minerals  1 tablet Oral Daily  . mupirocin ointment  1 application Nasal BID  . pantoprazole  40 mg Oral Daily  . piperacillin-tazobactam (ZOSYN)  IV  2.25 g Intravenous 3 times per day  . predniSONE  30 mg Oral Q breakfast  . sodium chloride flush  3 mL Intravenous Q12H  . thiamine  100 mg Oral Daily  . vitamin C  500 mg Oral BID   Continuous Infusions: . sodium chloride 1 mL/hr (04/15/15 2110)  . sodium chloride 50 mL/hr at 04/14/15 1050    Principal Problem:   Sepsis (HCC) Active Problems:   UTI (urinary tract infection)   Protein-calorie malnutrition, severe (HCC)   AKI (acute kidney injury) (HCC)   Failure to thrive in adult   Abscess of second toe of right foot   Osteomyelitis (HCC)    Time spent: 30 min    Craig [name]  [facility] Pager [phone]. If 7PM-7AM, please contact night-coverage at [email or web address], password [email or web address] 04/18/2015, 5:29 PM  LOS: 5 days

## 2015-04-18 NOTE — Progress Notes (Signed)
Pharmacy Antibiotic Note  Craig [name] is a 70 y.o. male admitted on 04/13/2015 with osteomyelitis.  Pharmacy has been consulted for fluconazole dosing dosing.   4/1 urine culture showing yeast and pseudomonas. Fluconazole ordered to start today.  Plan: -Fluconazole 100mg  IV q24 hours -Continue to hold daptomycin dosing until vancomycin level comes down to sub therapeutic range due to VR of 147 -Will obtain VR level on Monday -Continue Zosyn 2.25gIV q8h for pseudomonas -Monitor for improvements in renal function  Height: 6\' 3"  (190.5 cm) Weight: 185 lb 13.6 oz (84.3 kg) IBW/kg (Calculated) : 84.5  Temp (24hrs), Avg:98 F (36.7 C), Min:97.4 F (36.3 C), Max:98.7 F (37.1 C)   Recent Labs Lab 04/13/15 2144 04/13/15 2154 04/14/15 0057 04/14/15 0519 04/14/15 0615  04/14/15 0935 04/15/15 1015 04/16/15 0427 04/17/15 0815 04/18/15 1010  WBC  --   --   --   --  11.2*  --  10.2 8.5 9.5  --  12.7*  CREATININE  --   --   --  8.60*  --   --   --  9.74* 10.25* 10.70* 11.48*  LATICACIDVEN 0.93 1.04 0.40*  --   --   --   --   --   --   --   --   VANCORANDOM  --   --   --   --   --   < >  --   --  146*  --  115*  < > = values in this interval not displayed.  Estimated Creatinine Clearance: 7.3 mL/min (by C-G formula based on Cr of 11.48).    No Known Allergies  Antimicrobials this admission: Vanc pta > 04/01 Zosyn pta > Fluconazole 4/4>>  Dose adjustments this admission: Vancomycin discontinued   Microbiology results: 4/1 blood ZO:XWRUcx:ngtd 4/1 urine EA:VWUJWcx:yeast, 40k pseudomonas - pan sensitive Thank you for allowing pharmacy to be a part of this patient's care.   Okey RegalLisa Kenasia Scheller, PharmD [phone] 04/18/2015 1:15 PM

## 2015-04-19 DIAGNOSIS — G9341 Metabolic encephalopathy: Secondary | ICD-10-CM

## 2015-04-19 DIAGNOSIS — N19 Unspecified kidney failure: Secondary | ICD-10-CM

## 2015-04-19 LAB — RENAL FUNCTION PANEL
Albumin: 1.9 g/dL — ABNORMAL LOW (ref 3.5–5.0)
Anion gap: 15 (ref 5–15)
BUN: 136 mg/dL — ABNORMAL HIGH (ref 6–20)
CHLORIDE: 103 mmol/L (ref 101–111)
CO2: 21 mmol/L — ABNORMAL LOW (ref 22–32)
CREATININE: 11.79 mg/dL — AB (ref 0.61–1.24)
Calcium: 8.2 mg/dL — ABNORMAL LOW (ref 8.9–10.3)
GFR, EST AFRICAN AMERICAN: 4 mL/min — AB (ref >60)
GFR, EST NON AFRICAN AMERICAN: 4 mL/min — AB (ref >60)
Glucose, Bld: 94 mg/dL (ref 65–99)
POTASSIUM: 5.1 mmol/L (ref 3.5–5.1)
Phosphorus: 11.1 mg/dL — ABNORMAL HIGH (ref 2.5–4.6)
Sodium: 139 mmol/L (ref 135–145)

## 2015-04-19 LAB — CULTURE, BLOOD (ROUTINE X 2)
CULTURE: NO GROWTH
Culture: NO GROWTH

## 2015-04-19 MED ORDER — PANTOPRAZOLE SODIUM 40 MG IV SOLR
40.0000 mg | Freq: Every day | INTRAVENOUS | Status: DC
  Administered 2015-04-19 – 2015-04-23 (×5): 40 mg via INTRAVENOUS
  Filled 2015-04-19 (×6): qty 40

## 2015-04-19 MED ORDER — DEXTROSE-NACL 5-0.45 % IV SOLN
INTRAVENOUS | Status: DC
  Administered 2015-04-19 – 2015-04-23 (×5): via INTRAVENOUS

## 2015-04-19 NOTE — Progress Notes (Signed)
S: denies N/V.  O:BP 155/50 mmHg  Pulse 68  Temp(Src) 98.5 F (36.9 C) (Oral)  Resp 18  Ht 6\' 3"  (1.905 m)  Wt 84.3 kg (185 lb 13.6 oz)  BMI 23.23 kg/m2  SpO2 97%  Intake/Output Summary (Last 24 hours) at 04/19/15 0934 Last data filed at 04/19/15 0643  Gross per 24 hour  Intake    700 ml  Output    850 ml  Net   -150 ml   Weight change:  WUJ:WJXBJGen:awake and alert, though a little less verbal CVS:RRR no rub Resp: Decrease BS bases Abd: + BS NTND  + colostomy Ext: No edema.  Ischemic toes Rt foot NEURO:CNI.  Ox3 today  Mild asterixis GU: suprapubic cath   . amiodarone  200 mg Oral Daily  . aspirin EC  81 mg Oral Daily  . Chlorhexidine Gluconate Cloth  6 each Topical Q0600  . escitalopram  10 mg Oral Daily  . feeding supplement (ENSURE ENLIVE)  237 mL Oral BID BM  . feeding supplement (NEPRO CARB STEADY)  237 mL Oral BID BM  . feeding supplement (PRO-STAT SUGAR FREE 64)  30 mL Oral TID  . ferumoxytol  510 mg Intravenous Weekly  . fluconazole (DIFLUCAN) IV  100 mg Intravenous Q24H  . fluticasone furoate-vilanterol  1 puff Inhalation Daily  . folic acid  1 mg Oral Daily  . furosemide  160 mg Intravenous Q8H  . heparin  5,000 Units Subcutaneous 3 times per day  . lamoTRIgine  50 mg Oral Daily  . levothyroxine  125 mcg Oral QAC breakfast  . multivitamin with minerals  1 tablet Oral Daily  . mupirocin ointment  1 application Nasal BID  . pantoprazole  40 mg Oral Daily  . piperacillin-tazobactam (ZOSYN)  IV  2.25 g Intravenous 3 times per day  . predniSONE  30 mg Oral Q breakfast  . sodium chloride flush  3 mL Intravenous Q12H  . thiamine  100 mg Oral Daily  . vitamin C  500 mg Oral BID   No results found. BMET    Component Value Date/Time   NA 139 04/19/2015 0440   NA 140 03/14/2015   K 5.1 04/19/2015 0440   CL 103 04/19/2015 0440   CO2 21* 04/19/2015 0440   GLUCOSE 94 04/19/2015 0440   BUN 136* 04/19/2015 0440   BUN 12 03/14/2015   CREATININE 11.79* 04/19/2015 0440    CREATININE 0.9 03/14/2015   CALCIUM 8.2* 04/19/2015 0440   GFRNONAA 4* 04/19/2015 0440   GFRAA 4* 04/19/2015 0440   CBC    Component Value Date/Time   WBC 12.7* 04/18/2015 1010   WBC 8.6 03/14/2015   RBC 3.82* 04/18/2015 1010   RBC 3.99* 03/03/2015 0456   HGB 8.7* 04/18/2015 1010   HCT 27.5* 04/18/2015 1010   PLT 267 04/18/2015 1010   MCV 72.0* 04/18/2015 1010   MCH 22.8* 04/18/2015 1010   MCHC 31.6 04/18/2015 1010   RDW 21.5* 04/18/2015 1010   LYMPHSABS 0.7 04/14/2015 0935   MONOABS 0.1 04/14/2015 0935   EOSABS 0.0 04/14/2015 0935   BASOSABS 0.0 04/14/2015 0935     Assessment: 1. ARF ? Secondary to vanco toxicity.  UO improved and rate of rise of Scr less.  He is reluctant to have catheter placed.  I do expect renal fx to recover, its just a matter of time.  His underlying mental capacity makes decision making difficult. 2. Osteomyelitis Rt  foot  Plan: 1. Will hold off on  placing HD cath and follow renal fx.  He may be more amendable if becomes more uremic. 2. Recheck labs in AM Jyden Kromer T

## 2015-04-19 NOTE — Progress Notes (Signed)
PT very lethargic during assessment and while this RN was attempting to administer pt's morning meds. Pt easy to arouse and responds to voice but slow to answer questions and follow commands. Dr. Malachi BondsShort paged and notified. Will hold off on PO medications until further orders received. Will continue to monitor pt.

## 2015-04-19 NOTE — Progress Notes (Signed)
VASCULAR LAB  Have not received notification regarding necessity of repeating lower extremity arterial study. Please reorder if still needed.  04/19/2015 7:58 AM Gertie FeyMichelle Shaia Porath, RVT, RDCS, RDMS       Patient had ABI and arterial duplex 03/24/15 that demonstrated an ABI of 0.70 on the right and 0.93 on the left.  Duplex scan revealed extensive bilateral atherosclerosis with right > left significant aorta iliac intrarterial vascular disease and significant left mid SFA stenosis.  Report can be viewed under results review.  Please advise if you would like study repeated.   Sherren KernsKanady, Candace, RVT 04/15/15 01:53PM

## 2015-04-19 NOTE — Progress Notes (Addendum)
[facility] PROGRESS NOTE  Elayne GuerinWilliam Fadeley ZOX:[id number] DOB: 1945/09/23 DOA: 04/13/2015 PCP: Kirt Boysarter, Monica, DO  Brief Summary  [name] is a 70 year old gentleman with past medical history of peripheral vascular disease, chronic right toe ulcer, nonambulatory at baseline having poor functional status, severe protein malnutrition, who was seen by Dr Imogene Burnhen of vascular surgery on 03/30/2015. He was found to have frank pus draining from right second toe as it was recommended to him then that he be admitted to the hospital and evaluated by orthopedic surgery for amputation.  He refused to go to the emergency department. He has been treated with IV vancomycin and Zosyn in the outpatient setting.  He was admitted to the medicine service on 04/14/2015 presenting as a transfer from his skilled nursing facility for further evaluation of acute encephalopathy. On admission he had sepsis criteria with his source of infection likely to be from osteomyelitis. X-rays of his right foot showed lytic destruction of the second proximal phalanx consistent with acute osteomyelitis. Labs also revealed acute kidney injury with creatinine of 8.6, with previous lab work showing creatinine of 0.9 on 03/14/2015. Other issues include respiratory acidosis. An ABG revealed a pH of 7.198 with PCO2 of 63 and PaO2 of 73. Chest x-ray did not show acute infiltrate, VQ scan showed low probability for pulmonary embolism.  With regard to acute osteomyelitis, Dr Lajoyce Cornersuda evaluated Mr. Craig [name] and for now recommended treatment with IV antibiotic therapy holding off on surgical intervention. He also stated that he if surgery needed to be done would likely require a above-the-knee amputation. Vancomycin had been discontinued on admission due to supratherapeutic levels. Nephrology recommended to daptomycin. Zosyn was continued.   Regards to acute kidney injury nephrology was consulted. His creatinine has trended up from 8.44 on admission  to 10.7 on 04/17/2015. Nephrology felt that this could be related to vancomycin-induced nephrotoxicity having vancomycin levels of 146. Bilateral renal ultrasound negative for hydronephrosis. Critical illness/sepsis, hypovolemia may also have contributed. Labs have showed stable potassium with bicarbonate of 25.   Assessment/Plan  Sepsis, sepsis physiology resolved, although leukocytosis persists.  Sepsis may have been due to osteomyelitis.   -  Continue IV Zosyn -  Continue monitoring vancomycin levels and consider starting daptomycin once subtherapeutic -  Remains afebrile, blood pressure stable. Blood cultures drawn 04/14/2015 showing no growth to date.  Acute hypoxemic hypercarbic respiratory failure, resolved.   - Chest x-ray performed in the emergency department did not reveal acute infiltrate. 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 Craig [name] recover from this illness.  Acute encephalopathy, now likely due to uremia.  Somnolent and not able to swallow pills safely -  Anticipate he will need HD soon -  NPO and swallow evaluation -  Aspiration precautions  Acute osteomyelitis with underlying PVD -Mr. Craig [name] now amenable to amputation if necessary -X-ray performed in the emergency department showing lytic destruction of the second proximal phalanx consistent with acute osteomyelitis. - Dr.Duda of orthopedic surgery recommended continuing medical management for now  - Should his condition worsen, he would need an above-the-knee  amputation  Candidal urinary tract infection. -  Urine culture growing Pseudomonas and yeast - currently on Zosyn through at least 4/7 - Continue IV Diflucan through 4/14  Microcytic anemia, likely iron defiency -He was transfused with 2 units of PRBC's on 04/15/2015 -Feraheme given 4/1  Chronic obstructive pulmonary disease, no longer wheezing - d/c steroids  Severe protein calorie malnutrition -Patient having a a pre-albumin level of 7.0. At baseline has a poor functional status - NPO pending swallow evaluation  Medical goals of care, Appreciate palliative care assistance, consult completed on 4/2  Diet:  NPO Access:  PIV IVF:  yes Proph:  heparin  Code Status: DNR Family Communication: patient and POA [name] Disposition Plan:  Likely to need HD tomorrow  Consultants:  Nephrology  Orthopedic surgery  Palliative care  Procedures:  none  Antibiotics:  Vancomycin ongoing due to elevated levels  Zosyn   Fluconazole 4/4  HPI/Subjective:  Patient lethargic, unable to answer questions today  Objective: Filed Vitals:   04/18/15 1700 04/18/15 2146 04/19/15 0547 04/19/15 1651  BP: 166/56 170/57 155/50 145/65  Pulse: 68 77 68 67  Temp: 98.6 F (37 C) 98.2 F (36.8 C) 98.5 F (36.9 C) 98.6 F (37 C)  TempSrc: Oral Oral Oral Oral  Resp: Height:      Weight:      SpO2: 98% 100% 97% 98%    Intake/Output Summary (Last 24 hours) at 04/19/15 1721 Last data filed at 04/19/15 0643  Gross per 24 hour  Intake    460 ml  Output    700 ml  Net   -240 ml   Filed Weights   04/13/15 1732 04/16/15 0600 04/17/15 2315  Weight: 72.576 kg (160 lb) 69.5 kg (153 lb 3.5 oz) 84.3 kg (185 lb 13.6 oz)   Body mass index is 23.23 kg/(m^2).  Exam:   General:  Adult male, No acute distress  HEENT:  NCAT, MMM  Cardiovascular:  RRR, nl S1, S2 no mrg, 2+ pulses, warm extremities  Respiratory:  CTAB, no increased WOB  Abdomen:   NABS, soft,  NT/ND  MSK:   Severe muscle wasting of bilateral lower extremities with smooth skin and trace amount of slow pitting edema.  Diminished pulses bilaterally.  Right foot with dried second toe.    Neuro:  Grossly moves all extremities and able to move legs a little bit to assist with exam.   Data Reviewed: Basic Metabolic Panel:  Recent Labs Lab 04/14/15 0935 04/15/15 1015 04/16/15 0427 04/17/15 0815 04/18/15 1010 04/19/15 0440  NA  --  142 140 141 140 139  K  --  3.9 4.2 4.4 4.4 5.1  CL  --  102 103 104 104 103  CO2  --  21*  GLUCOSE  --  119* 121* 98 93 94  BUN  --  60* 73* 94* 121* 136*  CREATININE  --  9.74* 10.25* 10.70* 11.48* 11.79*  CALCIUM  --  8.5* 8.4* 8.2* 8.5* 8.2*  PHOS 8.0*  --   --  8.3*  --  11.1*   Liver Function Tests:  Recent Labs Lab 04/13/15 2115 04/14/15 0519 04/15/15 1015 04/17/15 0815 04/19/15 0440  AST 11* 12* 10*  --   --   ALT 9* 9* 7*  --   --   ALKPHOS 47 40 35*  --   --   BILITOT 1.3* 1.2 1.1  --   --   PROT 8.0 7.0 7.0  --   --  ALBUMIN 2.3* 2.0* 2.0* 1.9* 1.9*   No results for input(s): LIPASE, AMYLASE in the last 168 hours. No results for input(s): AMMONIA in the last 168 hours. CBC:  Recent Labs Lab 04/13/15 2115 04/14/15 0615 04/14/15 0935 04/15/15 1015 04/16/15 0427 04/18/15 1010  WBC 12.6* 11.2* 10.2 8.5 9.5 12.7*  NEUTROABS 9.3*  --  9.3*  --   --   --   HGB 8.5* 7.5* 7.3* 6.4* 8.2* 8.7*  HCT 27.2* 26.4* 25.6* 21.3* 25.8* 27.5*  MCV 71.6* 71.9* 71.9* 69.2* 71.9* 72.0*  PLT 309 250 287 261 248 267    Recent Results (from the past 240 hour(s))  Culture, blood (Routine X 2) w Reflex to ID Panel     Status: None   Collection Time: 04/14/15  4:11 AM  Result Value Ref Range Status   Specimen Description BLOOD LEFT ARM  Final   Special Requests BOTTLES DRAWN AEROBIC ONLY  Final   Culture NO GROWTH 5 DAYS  Final   Report Status 04/19/2015 FINAL  Final  Culture, blood (Routine X 2) w Reflex to ID Panel      Status: None   Collection Time: 04/14/15  4:16 AM  Result Value Ref Range Status   Specimen Description BLOOD LEFT HAND  Final   Special Requests BOTTLES DRAWN AEROBIC ONLY  Final   Culture NO GROWTH 5 DAYS  Final   Report Status 04/19/2015 FINAL  Final  Urine culture     Status: None   Collection Time: 04/14/15  5:16 AM  Result Value Ref Range Status   Specimen Description URINE, SUPRAPUBIC  Final   Special Requests NONE  Final   Culture   Final    >=100,000 COLONIES/mL YEAST 40,000 COLONIES/ml PSEUDOMONAS AERUGINOSA    Report Status 04/16/2015 FINAL  Final   Organism ID, Bacteria PSEUDOMONAS AERUGINOSA  Final      Susceptibility   Pseudomonas aeruginosa - MIC*    CEFTAZIDIME 4 SENSITIVE Sensitive     CIPROFLOXACIN <=0.25 SENSITIVE Sensitive     GENTAMICIN 4 SENSITIVE Sensitive     IMIPENEM <=0.25 SENSITIVE Sensitive     PIP/TAZO <=4 SENSITIVE Sensitive     CEFEPIME 4 SENSITIVE Sensitive     * 40,000 COLONIES/ml PSEUDOMONAS AERUGINOSA  MRSA PCR Screening     Status: Abnormal   Collection Time: 04/14/15 10:03 AM  Result Value Ref Range Status   MRSA by PCR POSITIVE (A) NEGATIVE Final    Comment:        The GeneXpert MRSA Assay (FDA approved for NASAL specimens only), is one component of a comprehensive MRSA colonization surveillance program. It is not intended to diagnose MRSA infection nor to guide or monitor treatment for MRSA infections. RESULT CALLED TO, READ BACK BY AND VERIFIED WITH: RN Sherlean Foot AT [id number] MARTINB      Studies: No results found.  Scheduled Meds: . Chlorhexidine Gluconate Cloth  6 each Topical Q0600  . ferumoxytol  510 mg Intravenous Weekly  . fluconazole (DIFLUCAN) IV  100 mg Intravenous Q24H  . fluticasone furoate-vilanterol  1 puff Inhalation Daily  . furosemide  160 mg Intravenous Q8H  . heparin  5,000 Units Subcutaneous 3 times per day  . mupirocin ointment  1 application Nasal BID  . pantoprazole (PROTONIX) IV  40 mg  Intravenous Daily  . piperacillin-tazobactam (ZOSYN)  IV  2.25 g Intravenous 3 times per day  . sodium chloride flush  3 mL Intravenous Q12H   Continuous Infusions: .  sodium chloride 1 mL/hr (04/15/15 2110)  . dextrose 5 % and 0.45% NaCl      Principal Problem:   Sepsis (HCC) Active Problems:   UTI (urinary tract infection)   Protein-calorie malnutrition, severe (HCC)   AKI (acute kidney injury) (HCC)   Failure to thrive in adult   Abscess of second toe of right foot   Osteomyelitis (HCC)    Time spent: 30 min    Riah Kehoe  [facility] Pager [phone]. If 7PM-7AM, please contact night-coverage at [email or web address], password [facility] 04/19/2015, 5:21 PM  LOS: 6 days

## 2015-04-20 ENCOUNTER — Inpatient Hospital Stay (HOSPITAL_COMMUNITY): Payer: Medicare Other

## 2015-04-20 LAB — CBC
HCT: 24.6 % — ABNORMAL LOW (ref 39.0–52.0)
HEMOGLOBIN: 8.1 g/dL — AB (ref 13.0–17.0)
MCH: 23.7 pg — AB (ref 26.0–34.0)
MCHC: 32.9 g/dL (ref 30.0–36.0)
MCV: 71.9 fL — ABNORMAL LOW (ref 78.0–100.0)
Platelets: 272 10*3/uL (ref 150–400)
RBC: 3.42 MIL/uL — ABNORMAL LOW (ref 4.22–5.81)
RDW: 22.6 % — ABNORMAL HIGH (ref 11.5–15.5)
WBC: 17.7 10*3/uL — ABNORMAL HIGH (ref 4.0–10.5)

## 2015-04-20 LAB — RENAL FUNCTION PANEL
ANION GAP: 16 — AB (ref 5–15)
Albumin: 2.2 g/dL — ABNORMAL LOW (ref 3.5–5.0)
BUN: 147 mg/dL — ABNORMAL HIGH (ref 6–20)
CALCIUM: 8.3 mg/dL — AB (ref 8.9–10.3)
CO2: 20 mmol/L — AB (ref 22–32)
CREATININE: 12.17 mg/dL — AB (ref 0.61–1.24)
Chloride: 100 mmol/L — ABNORMAL LOW (ref 101–111)
GFR calc Af Amer: 4 mL/min — ABNORMAL LOW (ref >60)
GFR calc non Af Amer: 4 mL/min — ABNORMAL LOW (ref >60)
GLUCOSE: 204 mg/dL — AB (ref 65–99)
Phosphorus: 12 mg/dL — ABNORMAL HIGH (ref 2.5–4.6)
Potassium: 4.6 mmol/L (ref 3.5–5.1)
Sodium: 136 mmol/L (ref 135–145)

## 2015-04-20 LAB — GLUCOSE, CAPILLARY: Glucose-Capillary: 92 mg/dL (ref 65–99)

## 2015-04-20 MED ORDER — SODIUM CHLORIDE 0.9 % IV SOLN
510.0000 mg | Freq: Once | INTRAVENOUS | Status: AC
  Administered 2015-04-20: 510 mg via INTRAVENOUS
  Filled 2015-04-20: qty 17

## 2015-04-20 MED ORDER — LIDOCAINE HCL (PF) 1 % IJ SOLN: 30.0000 mL | Freq: Once | INTRAMUSCULAR | Status: DC

## 2015-04-20 NOTE — Progress Notes (Signed)
[facility] PROGRESS NOTE  Craig GuerinWilliam [name] GEX:[id number] DOB: 25-May-1945 DOA: 04/13/2015 PCP: Kirt Boysarter, Monica, DO  Brief Summary  Craig [name] is a 70 year old gentleman with past medical history of peripheral vascular disease, chronic right toe ulcer, nonambulatory at baseline having poor functional status, severe protein malnutrition, who was seen by Dr Imogene Burnhen of vascular surgery on 03/30/2015. He was found to have frank pus draining from right second toe as it was recommended to him then that he be admitted to the hospital and evaluated by orthopedic surgery for amputation.  He refused to go to the emergency department. He has been treated with IV vancomycin and Zosyn in the outpatient setting.  He was admitted to the medicine service on 04/14/2015 presenting as a transfer from his skilled nursing facility for further evaluation of acute encephalopathy. On admission he had sepsis criteria with his source of infection likely to be from osteomyelitis. X-rays of his right foot showed lytic destruction of the second proximal phalanx consistent with acute osteomyelitis. Labs also revealed acute kidney injury with creatinine of 8.6, with previous lab work showing creatinine of 0.9 on 03/14/2015. Other issues include respiratory acidosis. An ABG revealed a pH of 7.198 with PCO2 of 63 and PaO2 of 73. Chest x-ray did not show acute infiltrate, VQ scan showed low probability for pulmonary embolism.  With regard to acute osteomyelitis, Dr Lajoyce Cornersuda evaluated Craig. Laural [name] and for now recommended treatment with IV antibiotic therapy holding off on surgical intervention. He also stated that he if surgery needed to be done would likely require a above-the-knee amputation. Vancomycin had been discontinued on admission due to supratherapeutic levels. Nephrology recommended to daptomycin. Zosyn was continued.   Regards to acute kidney injury nephrology was consulted. His creatinine has trended up from 8.44 on admission  to 10.7 on 04/17/2015. Nephrology felt that this could be related to vancomycin-induced nephrotoxicity having vancomycin levels of 146. Bilateral renal ultrasound negative for hydronephrosis. Critical illness/sepsis, hypovolemia may also have contributed. Labs have showed stable potassium with bicarbonate of 25.   Assessment/Plan  Sepsis, sepsis physiology resolved, although leukocytosis persists.  Sepsis may have been due to osteomyelitis.  WBC rising.   -  Continue IV Zosyn -  Continue monitoring vancomycin levels and consider starting daptomycin once subtherapeutic -  Remains afebrile, blood pressure stable. Blood cultures drawn 04/14/2015 showing no growth to date.  Acute hypoxemic hypercarbic respiratory failure, resolved.   - Chest x-ray performed in the emergency department did not reveal acute infiltrate. 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  [place]. -Nephrology assistance appreciated - Start HD today.  Catheter placed by Dr. Briant CedarMattingly   Acute encephalopathy, now likely due to uremia.  Somnolent and not able to swallow pills safely -  Anticipate he will need HD soon -  Strict NPO, including medications -  Aspiration precautions  Acute osteomyelitis with underlying PVD -Craig. Laural [name] now amenable to amputation if necessary -X-ray performed in the emergency department showing lytic destruction of the second proximal phalanx consistent with acute osteomyelitis. - Dr.Duda of orthopedic surgery recommended continuing medical management for now  - Should his condition worsen, he would need an above-the-knee amputation  Candidal urinary tract infection. - Urine culture growing Pseudomonas and yeast - currently on Zosyn through at least 4/7 - Continue IV Diflucan through 4/14  Microcytic anemia, likely  iron defiency -He was transfused with 2 units of  PRBC's on 04/15/2015 -Feraheme given 4/1  Chronic obstructive pulmonary disease, no longer wheezing - d/c steroids  Severe protein calorie malnutrition -Patient having a a pre-albumin level of 7.0. At baseline has a poor functional status - NPO pending swallow evaluation  Medical goals of care, Appreciate palliative care assistance, consult completed on 4/2  Leukocytosis, on broad spectrum abx.  May be related to his leg or early sign of C. Diff -  Monitor for diarrhea - CXR unremarkable - repeat UA  Diet:  NPO Access:  PIV IVF:  yes Proph:  heparin  Code Status: DNR Family Communication: patient and POA Craig [name] Disposition Plan:  Prognosis is poor.  Will attempt HD, but even if kidneys recover, he continues to have a gangrenous leg.    Consultants:  Nephrology  Orthopedic surgery  Palliative care  Procedures:  Started HD 4/7  Antibiotics:  Vancomycin ongoing due to elevated levels  Zosyn   Fluconazole 4/4  HPI/Subjective:  Patient lethargic, unable to answer questions today  Objective: Filed Vitals:   04/20/15 1303 04/20/15 1330 04/20/15 1400 04/20/15 1430  BP: 149/49 152/59 135/59 144/61  Pulse: 68 73 70 74  Temp:      TempSrc:      Resp: 15 15 16 17   Height:      Weight:    89 kg (196 lb 3.4 oz)  SpO2:        Intake/Output Summary (Last 24 hours) at 04/20/15 1802 Last data filed at 04/20/15 1606  Gross per 24 hour  Intake     10 ml  Output   1625 ml  Net  -1615 ml   Filed Weights   04/20/15 0114 04/20/15 1251 04/20/15 1430  Weight: 56.4 kg (124 lb 5.4 oz) 90 kg (198 lb 6.6 oz) 89 kg (196 lb 3.4 oz)   Body mass index is 24.52 kg/(m^2).  Exam:   General:  Adult male, No acute distress, lethargic, unable to follow commands  HEENT:  NCAT, MMM  Cardiovascular:  RRR, nl S1, S2 no mrg, 2+ pulses, warm extremities  Respiratory:  CTAB, no increased WOB  Abdomen:   NABS, soft,  NT/ND  MSK:   Severe muscle wasting of bilateral lower extremities with smooth skin and trace amount of slow pitting edema.  Diminished pulses bilaterally.  Right foot with dried second toe.    Neuro:  Grossly moves all extremities and able to move legs a little bit to assist with exam.   Data Reviewed: Basic Metabolic Panel:  Recent Labs Lab 04/14/15 0935  04/16/15 0427 04/17/15 0815 04/18/15 1010 04/19/15 0440 04/20/15 0445  NA  --   < > 140 141 140 139 136  K  --   < > 4.2 4.4 4.4 5.1 4.6  CL  --   < > 103 104 104 103 100*  CO2  --   < > 25 23 22  21* 20*  GLUCOSE  --   < > 121* 98 93 94 204*  BUN  --   < > 73* 94* 121* 136* 147*  CREATININE  --   < > 10.25* 10.70* 11.48* 11.79* 12.17*  CALCIUM  --   < > 8.4* 8.2* 8.5* 8.2* 8.3*  PHOS 8.0*  --   --  8.3*  --  11.1* 12.0*  < > = values in this interval not displayed. Liver Function Tests:  Recent Labs Lab 04/13/15 2115 04/14/15 0519 04/15/15 1015 04/17/15 0815 04/19/15 0440 04/20/15 0445  AST 11* 12*  10*  --   --   --   ALT 9* 9* 7*  --   --   --   ALKPHOS 47 40 35*  --   --   --   BILITOT 1.3* 1.2 1.1  --   --   --   PROT 8.0 7.0 7.0  --   --   --   ALBUMIN 2.3* 2.0* 2.0* 1.9* 1.9* 2.2*   No results for input(s): LIPASE, AMYLASE in the last 168 hours. No results for input(s): AMMONIA in the last 168 hours. CBC:  Recent Labs Lab 04/13/15 2115  04/14/15 0935 04/15/15 1015 04/16/15 0427 04/18/15 1010 04/20/15 0445  WBC 12.6*  < > 10.2 8.5 9.5 12.7* 17.7*  NEUTROABS 9.3*  --  9.3*  --   --   --   --   HGB 8.5*  < > 7.3* 6.4* 8.2* 8.7* 8.1*  HCT 27.2*  < > 25.6* 21.3* 25.8* 27.5* 24.6*  MCV 71.6*  < > 71.9* 69.2* 71.9* 72.0* 71.9*  PLT 309  < > 287 261 248 267 272  < > = values in this interval not displayed.  Recent Results (from the past 240 hour(s))  Culture, blood (Routine X 2) w Reflex to ID Panel     Status: None   Collection Time: 04/14/15  4:11 AM  Result Value Ref Range Status   Specimen  Description BLOOD LEFT ARM  Final   Special Requests BOTTLES DRAWN AEROBIC ONLY  Final   Culture NO GROWTH 5 DAYS  Final   Report Status 04/19/2015 FINAL  Final  Culture, blood (Routine X 2) w Reflex to ID Panel     Status: None   Collection Time: 04/14/15  4:16 AM  Result Value Ref Range Status   Specimen Description BLOOD LEFT HAND  Final   Special Requests BOTTLES DRAWN AEROBIC ONLY  Final   Culture NO GROWTH 5 DAYS  Final   Report Status 04/19/2015 FINAL  Final  Urine culture     Status: None   Collection Time: 04/14/15  5:16 AM  Result Value Ref Range Status   Specimen Description URINE, SUPRAPUBIC  Final   Special Requests NONE  Final   Culture   Final    >=100,000 COLONIES/mL YEAST 40,000 COLONIES/ml PSEUDOMONAS AERUGINOSA    Report Status 04/16/2015 FINAL  Final   Organism ID, Bacteria PSEUDOMONAS AERUGINOSA  Final      Susceptibility   Pseudomonas aeruginosa - MIC*    CEFTAZIDIME 4 SENSITIVE Sensitive     CIPROFLOXACIN <=0.25 SENSITIVE Sensitive     GENTAMICIN 4 SENSITIVE Sensitive     IMIPENEM <=0.25 SENSITIVE Sensitive     PIP/TAZO <=4 SENSITIVE Sensitive     CEFEPIME 4 SENSITIVE Sensitive     * 40,000 COLONIES/ml PSEUDOMONAS AERUGINOSA  MRSA PCR Screening     Status: Abnormal   Collection Time: 04/14/15 10:03 AM  Result Value Ref Range Status   MRSA by PCR POSITIVE (A) NEGATIVE Final    Comment:        The GeneXpert MRSA Assay (FDA approved for NASAL specimens only), is one component of a comprehensive MRSA colonization surveillance program. It is not intended to diagnose MRSA infection nor to guide or monitor treatment for MRSA infections. RESULT CALLED TO, READ BACK BY AND VERIFIED WITH: RN Sherlean Foot AT [id number] [id number] MARTINB      Studies: Dg Chest Port 1 View  04/20/2015  CLINICAL DATA:  Central catheter placement  EXAM: PORTABLE CHEST 1 VIEW COMPARISON:  April 15, 2015 FINDINGS: Right jugular catheter tip is in the superior vena cava. Right  peripherally inserted central catheter tip is in the superior vena cava. No pneumothorax. There is mild bibasilar atelectasis. Lungs elsewhere clear. Heart size and pulmonary vascularity are normal. No adenopathy. There is extensive atherosclerotic calcification in the aorta. IMPRESSION: Central catheter tips are in the superior vena cava. No pneumothorax. Mild bibasilar atelectasis. No edema or consolidation. Stable cardiac silhouette. Extensive atherosclerotic calcification in the aorta. Electronically Signed   By: Bretta Bang III M.D.   On: 04/20/2015 11:34    Scheduled Meds: . Chlorhexidine Gluconate Cloth  6 each Topical Q0600  . ferumoxytol  510 mg Intravenous Once  . fluconazole (DIFLUCAN) IV  100 mg Intravenous Q24H  . fluticasone furoate-vilanterol  1 puff Inhalation Daily  . furosemide  160 mg Intravenous Q8H  . heparin  5,000 Units Subcutaneous 3 times per day  . mupirocin ointment  1 application Nasal BID  . pantoprazole (PROTONIX) IV  40 mg Intravenous Daily  . piperacillin-tazobactam (ZOSYN)  IV  2.25 g Intravenous 3 times per day  . sodium chloride flush  3 mL Intravenous Q12H   Continuous Infusions: . sodium chloride 1 mL/hr (04/15/15 2110)  . dextrose 5 % and 0.45% NaCl 50 mL/hr at 04/19/15 1853    Principal Problem:   Sepsis (HCC) Active Problems:   UTI (urinary tract infection)   Protein-calorie malnutrition, severe (HCC)   AKI (acute kidney injury) (HCC)   Failure to thrive in adult   Abscess of second toe of right foot   Osteomyelitis (HCC)    Time spent: 30 min    Craig [name]  [facility] Pager [phone]. If 7PM-7AM, please contact night-coverage at [email or web address], password [facility] 04/20/2015, 6:02 PM  LOS: 7 days

## 2015-04-20 NOTE — Procedures (Signed)
Rt IJ HD cath.  Rt neck prepped and draped in sterile fashion and local anesthesia obtained with 6cc lidocaine.  Rt IJ vein was cannulated with 18g needle and 3 lumen HD cath placed via seldinger technique.  Lumens flushed with saline and filled with heparin.  Pt tolerated procedure well.  CXR pending.

## 2015-04-20 NOTE — Progress Notes (Signed)
S: denies N/V.  He is agreeable to HD O:BP 156/56 mmHg  Pulse 72  Temp(Src) 97.5 F (36.4 C) (Oral)  Resp 18  Ht 6\' 3"  (1.905 m)  Wt 56.4 kg (124 lb 5.4 oz)  BMI 15.54 kg/m2  SpO2 100%  Intake/Output Summary (Last 24 hours) at 04/20/15 0926 Last data filed at 04/20/15 0427  Gross per 24 hour  Intake    190 ml  Output    675 ml  Net   -485 ml   Weight change:  WGN:FAOZHGen:awake and alert, though a little less verbal CVS:RRR no rub Resp: Decrease BS bases Abd: + BS NTND  + colostomy Ext: No edema.  Ischemic toes Rt foot NEURO:CNI.  Ox3 today  + asterixis GU: suprapubic cath   . Chlorhexidine Gluconate Cloth  6 each Topical Q0600  . ferumoxytol  510 mg Intravenous Weekly  . fluconazole (DIFLUCAN) IV  100 mg Intravenous Q24H  . fluticasone furoate-vilanterol  1 puff Inhalation Daily  . furosemide  160 mg Intravenous Q8H  . heparin  5,000 Units Subcutaneous 3 times per day  . mupirocin ointment  1 application Nasal BID  . pantoprazole (PROTONIX) IV  40 mg Intravenous Daily  . piperacillin-tazobactam (ZOSYN)  IV  2.25 g Intravenous 3 times per day  . sodium chloride flush  3 mL Intravenous Q12H   No results found. BMET    Component Value Date/Time   NA 136 04/20/2015 0445   NA 140 03/14/2015   K 4.6 04/20/2015 0445   CL 100* 04/20/2015 0445   CO2 20* 04/20/2015 0445   GLUCOSE 204* 04/20/2015 0445   BUN 147* 04/20/2015 0445   BUN 12 03/14/2015   CREATININE 12.17* 04/20/2015 0445   CREATININE 0.9 03/14/2015   CALCIUM 8.3* 04/20/2015 0445   GFRNONAA 4* 04/20/2015 0445   GFRAA 4* 04/20/2015 0445   CBC    Component Value Date/Time   WBC 17.7* 04/20/2015 0445   WBC 8.6 03/14/2015   RBC 3.42* 04/20/2015 0445   RBC 3.99* 03/03/2015 0456   HGB 8.1* 04/20/2015 0445   HCT 24.6* 04/20/2015 0445   PLT 272 04/20/2015 0445   MCV 71.9* 04/20/2015 0445   MCH 23.7* 04/20/2015 0445   MCHC 32.9 04/20/2015 0445   RDW 22.6* 04/20/2015 0445   LYMPHSABS 0.7 04/14/2015 0935   MONOABS  0.1 04/14/2015 0935   EOSABS 0.0 04/14/2015 0935   BASOSABS 0.0 04/14/2015 0935     Assessment: 1. ARF ? Secondary to vanco toxicity.   2. Osteomyelitis Rt  foot  Plan: 1. Will place HD cath today and start HD   Craig [name] T

## 2015-04-20 NOTE — Care Management Note (Signed)
Case Management Note  Patient Details  Name: Craig GuerinWilliam [name] MRN: [id number] Date of Birth: 04/01/1945  Subjective/Objective:         CM following for progression and d/c planning.           Action/Plan: 04/20/2015 Noted consult for medication assistance discussed with MD, this is thought to have been entered on the wrong patient by the RN as this pt is a SNF resident and medications are provided at that center. Will continue to follow for other needs.   Expected Discharge Date:                  Expected Discharge Plan:  Skilled Nursing Facility  In-House Referral:  Clinical Social Work  Discharge planning Services     Post Acute Care Choice:    Choice offered to:     DME Arranged:    DME Agency:     HH Arranged:    HH Agency:     Status of Service:  Completed, signed off  Medicare Important Message Given:    Date Medicare IM Given:    Medicare IM give by:    Date Additional Medicare IM Given:    Additional Medicare Important Message give by:     If discussed at Long Length of Stay Meetings, dates discussed:    Additional Comments:  Starlyn SkeansRoyal, Dmiyah Liscano U, RN 04/20/2015, 11:23 AM

## 2015-04-20 NOTE — Evaluation (Signed)
Clinical/Bedside Swallow Evaluation Patient Details  Name: Craig [name] MRN: [id number] Date of Birth: Jul 14, 1945  Today's Date: 04/20/2015 Time: SLP Start Time (ACUTE ONLY): 1335 SLP Stop Time (ACUTE ONLY): 1345 SLP Time Calculation (min) (ACUTE ONLY): 10 min  Past Medical History:  Past Medical History  Diagnosis Date  . COPD (chronic obstructive pulmonary disease) (HCC)   . Hypertension   . Myocardial infarction (HCC)   . A-fib (HCC)   . GERD (gastroesophageal reflux disease)   . Hepatitis B 04/27/2013  . Dysphagia 04/27/2013  . Diverticulosis 03/05/2013  . Sepsis (HCC) 04/26/2013  . Alcoholic cirrhosis of liver without ascites (HCC)   . Coronary artery disease   . Thyroid disease   . Depression   . Urinary retention    Past Surgical History:  Past Surgical History  Procedure Laterality Date  . Colostomy    . Peg tube placement    . Peg tube removal     HPI:  70 y.o. male with h/o COPD, atrial fibrillation, HTN, MI, GERD, hepatitis B, dysphagia, diverticulosis, sepsis, alcoholic cirrhosis of liver without ascites, CAD, thyroid disease, PVD and depression, who presented to ED due to gangrenous toe which needed amputation. Pt acute encephalopathic upon arrival with muffled speech. CXR 4/7 mild bibasilar atelectasis. Per chart, difficulty with pills observed and swallow evaluation ordered.   Assessment / Plan / Recommendation Clinical Impression  Patient exhbits a severe oropharyngeal dysphagia with poor oral control and transit of bolus, suspected swallow initiation delays and immediate and delayed cough with thin liquids and ice chips. Patient is also very lethargic, with alertness fluctuating, which impacted his abiilty to safely consume PO's. SLP did not trial any purees or other solids secondary to concern that patient was not alert or able to effectively initiate swallow with these consistencies. Patient was elevated in bed, however because he was in dialysis, RN was not able  to fully elevate him to adequate posture for testing.     Aspiration Risk  Severe aspiration risk;Risk for inadequate nutrition/hydration    Diet Recommendation NPO   Medication Administration: Via alternative means    Other  Recommendations Oral Care Recommendations: Oral care BID   Follow up Recommendations  Skilled Nursing facility    Frequency and Duration min 3x week  2 weeks       Prognosis Prognosis for Safe Diet Advancement: Good      Swallow Study   General Date of Onset: 04/13/15 HPI: 70 y.o. male with h/o COPD, atrial fibrillation, HTN, MI, GERD, hepatitis B, dysphagia, diverticulosis, sepsis, alcoholic cirrhosis of liver without ascites, CAD, thyroid disease, PVD and depression, who presented to ED due to gangrenous toe which needed amputation. Pt acute encephalopathic upon arrival with muffled speech. CXR 4/7 mild bibasilar atelectasis. Per chart, difficulty with pills observed and swallow evaluation ordered. Type of Study: Bedside Swallow Evaluation Previous Swallow Assessment: MBSS approximately 2 years ago, revealing mild dysphagia Diet Prior to this Study: NPO Temperature Spikes Noted: No History of Recent Intubation: No Behavior/Cognition: Confused;Lethargic/Drowsy;Requires cueing;Doesn't follow directions Oral Care Completed by SLP: No Oral Cavity - Dentition: Adequate natural dentition Self-Feeding Abilities: Total assist Patient Positioning: Postural control interferes with function;Upright in bed Baseline Vocal Quality: Low vocal intensity;Other (comment) (only intermittently verbalized, but otherwise was largely aphonic) Volitional Cough: Cognitively unable to elicit Volitional Swallow: Unable to elicit    Oral/Motor/Sensory Function Overall Oral Motor/Sensory Function: Generalized oral weakness   Ice Chips Ice chips: Impaired Presentation: Spoon Oral Phase Impairments: Reduced labial seal;Poor awareness  of bolus Pharyngeal Phase Impairments:  Suspected delayed Swallow;Cough - Immediate;Cough - Delayed   Thin Liquid Thin Liquid: Impaired Presentation: Cup Oral Phase Impairments: Reduced labial seal Pharyngeal  Phase Impairments: Suspected delayed Swallow;Wet Vocal Quality;Cough - Immediate;Cough - Delayed    Nectar Thick Nectar Thick Liquid: Not tested   Honey Thick Honey Thick Liquid: Not tested   Puree Puree: Not tested   Solid   GO   Solid: Not tested        Craig [name] 04/20/2015,5:14 PM     Craig Nevin, MA, CCC-SLP 04/20/2015 5:14 PM

## 2015-04-20 NOTE — Clinical Documentation Improvement (Signed)
Internal Medicine  Can the diagnosis of encephalopathy be further specified in progress notes and discharge summary?   Metabolic encephalopathy  Other  Clinically Undetermined  Document any associated diagnoses/conditions.   Supporting Information:  70 year old male presenting with sepsis due to osteomyelitis of toe plus multiple comorbidities  4/1, 4/2, 4/3  progress note: -Craig [name] at baseline having a poor functional status, resident at SNF, having multiple comorbidities presenting with mental status changes I suspect acute encephalopathy related to multiple factors including sepsis/infection, acute renal failure, hypoxemic hypercarbic respiratory failure, dehydration.   4/2 4/3  progress note: -On 04/15/2015 showing clinical improvement  4/5 progress note  Acute encephalopathy, likely due to sepsis, AKI and now likely to develop uremic symptoms  4/6 progress note He was admitted to the medicine service on 04/14/2015 presenting as a transfer from his skilled nursing facility for further evaluation of acute encephalopathy Acute encephalopathy, now likely due to uremia. Somnolent and not able to swallow pills safely - Anticipate he will need HD soon - NPO and swallow evaluation - Aspiration precautions  Treatment: HD IV fluids O2 IV Abx   Please exercise your independent, professional judgment when responding. A specific answer is not anticipated or expected.   Thank You,  Harless Littenebora T Reighan Hipolito [facility] Information Management [place] [phone]

## 2015-04-20 NOTE — Care Management Important Message (Signed)
Important Message  Patient Details  Name: Craig GuerinWilliam Barrientes MRN: [id number] Date of Birth: [birth year]   Medicare Important Message Given:  Yes    Lyonel Morejon, Annamarie Majorheryl U, RN 04/20/2015, 4:25 PM

## 2015-04-21 LAB — GLUCOSE, CAPILLARY
Glucose-Capillary: 84 mg/dL (ref 65–99)
Glucose-Capillary: 87 mg/dL (ref 65–99)
Glucose-Capillary: 77 mg/dL (ref 65–99)
Glucose-Capillary: 76 mg/dL (ref 65–99)

## 2015-04-21 LAB — CBC
HCT: 19.7 % — ABNORMAL LOW (ref 39.0–52.0)
Hemoglobin: 6.6 g/dL — CL (ref 13.0–17.0)
MCH: 23.7 pg — ABNORMAL LOW (ref 26.0–34.0)
MCHC: 33.5 g/dL (ref 30.0–36.0)
MCV: 70.6 fL — ABNORMAL LOW (ref 78.0–100.0)
PLATELETS: 209 10*3/uL (ref 150–400)
RBC: 2.79 MIL/uL — ABNORMAL LOW (ref 4.22–5.81)
RDW: 22.6 % — ABNORMAL HIGH (ref 11.5–15.5)
WBC: 11.7 10*3/uL — AB (ref 4.0–10.5)

## 2015-04-21 LAB — RENAL FUNCTION PANEL
ALBUMIN: 1.9 g/dL — AB (ref 3.5–5.0)
ANION GAP: 14 (ref 5–15)
BUN: 106 mg/dL — AB (ref 6–20)
CALCIUM: 7.7 mg/dL — AB (ref 8.9–10.3)
CO2: 23 mmol/L (ref 22–32)
CREATININE: 9.24 mg/dL — AB (ref 0.61–1.24)
Chloride: 102 mmol/L (ref 101–111)
GFR calc Af Amer: 6 mL/min — ABNORMAL LOW (ref >60)
GFR calc non Af Amer: 5 mL/min — ABNORMAL LOW (ref >60)
GLUCOSE: 91 mg/dL (ref 65–99)
Phosphorus: 8.3 mg/dL — ABNORMAL HIGH (ref 2.5–4.6)
Potassium: 4 mmol/L (ref 3.5–5.1)
SODIUM: 139 mmol/L (ref 135–145)

## 2015-04-21 LAB — PREPARE RBC (CROSSMATCH)

## 2015-04-21 LAB — VANCOMYCIN, RANDOM: VANCOMYCIN RM: 114 ug/mL — AB

## 2015-04-21 MED ORDER — CLONIDINE HCL 0.1 MG PO TABS
0.1000 mg | ORAL_TABLET | Freq: Once | ORAL | Status: DC
  Filled 2015-04-21: qty 1

## 2015-04-21 MED ORDER — HYDRALAZINE HCL 20 MG/ML IJ SOLN
INTRAMUSCULAR | Status: AC
  Filled 2015-04-21: qty 1

## 2015-04-21 MED ORDER — HYDRALAZINE HCL 20 MG/ML IJ SOLN
10.0000 mg | INTRAMUSCULAR | Status: DC | PRN
  Administered 2015-04-21 – 2015-04-22 (×2): 10 mg via INTRAVENOUS
  Filled 2015-04-21 (×3): qty 1

## 2015-04-21 MED ORDER — METOPROLOL TARTRATE 1 MG/ML IV SOLN
INTRAVENOUS | Status: AC
  Filled 2015-04-21: qty 5

## 2015-04-21 MED ORDER — SODIUM CHLORIDE 0.9 % IV SOLN: Freq: Once | INTRAVENOUS | Status: DC

## 2015-04-21 MED ORDER — METOPROLOL TARTRATE 1 MG/ML IV SOLN
5.0000 mg | Freq: Once | INTRAVENOUS | Status: AC
  Administered 2015-04-21: 5 mg via INTRAVENOUS

## 2015-04-21 MED ORDER — HYDRALAZINE HCL 20 MG/ML IJ SOLN
10.0000 mg | Freq: Once | INTRAMUSCULAR | Status: AC
  Administered 2015-04-21: 10 mg via INTRAVENOUS

## 2015-04-21 NOTE — Progress Notes (Signed)
Pharmacy Antibiotic Note  Craig [name] is a 70 y.o. male admitted on 04/13/2015 with osteomyelitis.  Pharmacy has been consulted for fluconazole, daptomycin and Zosyn dosing.   Afebrile, WBC 11.7 down from 17.7, SCr 9.24, UOP down to 0.2 mL/kg/hr.  Pt received 2 hr HD on 4/7 with plans for 3 hrs today. VR level is still supratherapeutic at 114 mcg/mL.   Plan: -Continue Fluconazole 100mg  IV q24 hours through 4/14 -Continue Zosyn 2.25 g IV q8h for pseudomonas  -Continue to hold daptomycin dosing until vancomycin level comes down to sub therapeutic range due to VR of 114 -Will obtain VR level in a few days pending toleration of HD -Monitor for improvements in renal function -Will F/U length of treatment for other antibiotics   Height: 6\' 3"  (190.5 cm) Weight: 196 lb 3.4 oz (89 kg) IBW/kg (Calculated) : 84.5  Temp (24hrs), Avg:98.3 F (36.8 C), Min:97.1 F (36.2 C), Max:99.1 F (37.3 C)   Recent Labs Lab 04/15/15 1015  04/16/15 0427 04/17/15 0815 04/18/15 1010 04/19/15 0440 04/20/15 0445 04/21/15 0532  WBC 8.5  --  9.5  --  12.7*  --  17.7* 11.7*  CREATININE 9.74*  --  10.25* 10.70* 11.48* 11.79* 12.17* 9.24*  VANCORANDOM  --   < > 146*  --  115*  --   --  114*  < > = values in this interval not displayed.  Estimated Creatinine Clearance: 9.1 mL/min (by C-G formula based on Cr of 9.24).    No Known Allergies  Antimicrobials this admission: Vanc PTA > 04/01 Zosyn PTA > Fluconazole 4/4>> [through 4/14] Daptomycin (not yet started) >  Dose adjustments this admission: Vanc PTA discontinued 4/1 [intended to stop 4/28]      * 4/1 VR: 147 mcg/ml      * 4/3 VR: 146 mcg/ml      * 4/5 VR: 115 mcg/ml      * 4/8 VR: 114 mcg/mL  Microbiology results: 4/1 MRSA PCR >> positive 4/1 UCx >> 100k yeast + 40k PSA 4/1 BCx >> NG  Thank you for allowing pharmacy to be a part of this patient's care.  Arcola JanskyMeagan Alliyah Roesler, PharmD Clinical Pharmacy Resident Pager: [phone]   04/21/2015 10:42 AM

## 2015-04-21 NOTE — Progress Notes (Signed)
Results for Elayne GuerinJOHNSON, Drayven (MRN [id number]) as of 04/21/2015 06:43  Ref. Range 04/21/2015 05:32  Hemoglobin Latest Ref Range: 13.0-17.0 g/dL 6.6 (LL)   Called and made [facility] aware.  They will refer to oncoming MD for final disposition.  Will continue to monitor patient.  Owens & MinorKimberly Khaleb Broz RN-BC, WTA.

## 2015-04-21 NOTE — Progress Notes (Signed)
CRITICAL VALUE ALERT  Critical value received:  Random Vanc 114  Date of notification:  04/21/2015  Time of notification:  0730; received from night shift nurse during shift change   Critical value read back:Yes.    Nurse who received alert: Malen GauzeKim G, RN received alert from lab; Karna Dupesamieko H, RN was told during shift change   MD notified (1st page):  Dr. Malachi BondsShort   Time of first page:  0750  MD notified (2nd page):  Time of second page:  Responding MD: Dr. Malachi BondsShort   Time MD responded:  [phone]

## 2015-04-21 NOTE — Progress Notes (Signed)
S: No new CO O:BP 144/51 mmHg  Pulse 68  Temp(Src) 98.6 F (37 C) (Oral)  Resp 18  Ht 6\' 3"  (1.905 m)  Wt 89 kg (196 lb 3.4 oz)  BMI 24.52 kg/m2  SpO2 96%  Intake/Output Summary (Last 24 hours) at 04/21/15 1002 Last data filed at 04/21/15 0900  Gross per 24 hour  Intake    716 ml  Output   1825 ml  Net  -1109 ml   Weight change: 33.6 kg (74 lb 1.2 oz) ZOX:WRUEAGen:awake and alert, though a little less verbal CVS:RRR no rub Resp: Decrease BS bases Abd: + BS NTND  + colostomy Ext: tr edema.  Ischemic toes Rt foot NEURO:CNI.  Ox3 today  + asterixis GU: suprapubic cath Rt IJ temp cath   . sodium chloride   Intravenous Once  . fluconazole (DIFLUCAN) IV  100 mg Intravenous Q24H  . fluticasone furoate-vilanterol  1 puff Inhalation Daily  . furosemide  160 mg Intravenous Q8H  . heparin  5,000 Units Subcutaneous 3 times per day  . mupirocin ointment  1 application Nasal BID  . pantoprazole (PROTONIX) IV  40 mg Intravenous Daily  . piperacillin-tazobactam (ZOSYN)  IV  2.25 g Intravenous 3 times per day  . sodium chloride flush  3 mL Intravenous Q12H   Dg Chest Port 1 View  04/20/2015  CLINICAL DATA:  Central catheter placement EXAM: PORTABLE CHEST 1 VIEW COMPARISON:  April 15, 2015 FINDINGS: Right jugular catheter tip is in the superior vena cava. Right peripherally inserted central catheter tip is in the superior vena cava. No pneumothorax. There is mild bibasilar atelectasis. Lungs elsewhere clear. Heart size and pulmonary vascularity are normal. No adenopathy. There is extensive atherosclerotic calcification in the aorta. IMPRESSION: Central catheter tips are in the superior vena cava. No pneumothorax. Mild bibasilar atelectasis. No edema or consolidation. Stable cardiac silhouette. Extensive atherosclerotic calcification in the aorta. Electronically Signed   By: Bretta BangWilliam  Woodruff III M.D.   On: 04/20/2015 11:34   BMET    Component Value Date/Time   NA 139 04/21/2015 0532   NA 140  03/14/2015   K 4.0 04/21/2015 0532   CL 102 04/21/2015 0532   CO2 23 04/21/2015 0532   GLUCOSE 91 04/21/2015 0532   BUN 106* 04/21/2015 0532   BUN 12 03/14/2015   CREATININE 9.24* 04/21/2015 0532   CREATININE 0.9 03/14/2015   CALCIUM 7.7* 04/21/2015 0532   GFRNONAA 5* 04/21/2015 0532   GFRAA 6* 04/21/2015 0532   CBC    Component Value Date/Time   WBC 11.7* 04/21/2015 0532   WBC 8.6 03/14/2015   RBC 2.79* 04/21/2015 0532   RBC 3.99* 03/03/2015 0456   HGB 6.6* 04/21/2015 0532   HCT 19.7* 04/21/2015 0532   PLT 209 04/21/2015 0532   MCV 70.6* 04/21/2015 0532   MCH 23.7* 04/21/2015 0532   MCHC 33.5 04/21/2015 0532   RDW 22.6* 04/21/2015 0532   LYMPHSABS 0.7 04/14/2015 0935   MONOABS 0.1 04/14/2015 0935   EOSABS 0.0 04/14/2015 0935   BASOSABS 0.0 04/14/2015 0935     Assessment: 1. ARF ? Secondary to vanco toxicity.   2. Osteomyelitis Rt  foot  Plan: 1. Plan HD again today 2. Note T&C of rbc's, will give on HD Sohail Capraro T

## 2015-04-21 NOTE — NC FL2 (Signed)
[place] MEDICAID FL2 LEVEL OF CARE SCREENING TOOL     IDENTIFICATION  Patient Name: Craig [name] Birthdate: 1945/07/08 Sex: male Admission Date (Current Location): 04/13/2015  [facility] and [place] Number:  Producer, television/film/video and Address:  [facility], [address], [place], [place] [address]      Provider Number: [id number]  Attending Physician Name and Address:  Renae Fickle, MD  Relative Name and Phone Number:       Current Level of Care: Hospital Recommended Level of Care: Skilled Nursing Facility Prior Approval Number:    Date Approved/Denied:   PASRR Number:    Discharge Plan: SNF    Current Diagnoses: Patient Active Problem List   Diagnosis Date Noted  . Sepsis (HCC) 04/14/2015  . Osteomyelitis (HCC) 04/13/2015  . Ulcer of right second toe with necrosis of muscle (HCC) 04/03/2015  . Atherosclerosis of native arteries of the extremities with ulceration (HCC) 03/30/2015  . Abscess of second toe of right foot 03/30/2015  . SBO (small bowel obstruction) (HCC)   . Palliative care encounter   . Pressure ulcer 03/02/2015  . Nausea and vomiting 03/01/2015  . Ileus, unspecified (HCC) 03/01/2015  . Small bowel obstruction, partial (HCC) 03/01/2015  . Dehydration 03/01/2015  . AKI (acute kidney injury) (HCC) 03/01/2015  . Bowel obstruction (HCC) 03/01/2015  . Peripheral vascular disease (HCC) 03/01/2015  . Failure to thrive in adult 03/01/2015  . Loss of weight 12/31/2014  . Aspiration pneumonia (HCC) 10/21/2014  . HCAP (healthcare-associated pneumonia) 10/21/2014  . Leukocytosis 10/21/2014  . Protein-calorie malnutrition, severe (HCC) 10/21/2014  . Anemia of chronic disease 10/21/2014  . GERD without esophagitis 10/21/2014  . Encounter for palliative care   . UTI (urinary tract infection) 08/28/2014  . Urine retention 08/28/2014  . Chronic pain 01/29/2014  . Hypokalemia 12/20/2013  . Anemia 04/29/2013  . HTN (hypertension)  04/27/2013  . COPD (chronic obstructive pulmonary disease) (HCC) 04/27/2013  . Dysphagia 04/27/2013  . Insomnia 04/10/2013  . Hypothyroidism 03/05/2013  . Depression 03/05/2013  . A-fib (HCC) 03/05/2013  . GERD (gastroesophageal reflux disease) 03/05/2013    Orientation RESPIRATION BLADDER Height & Weight     Self, Time  O2 (2 Liters) Indwelling catheter Weight: 191 lb 5.8 oz (86.8 kg) Height:   (190.5 cm)  BEHAVIORAL SYMPTOMS/MOOD NEUROLOGICAL BOWEL NUTRITION STATUS      Continent Diet (NPO)  AMBULATORY STATUS COMMUNICATION OF NEEDS Skin   Total Care Verbally Surgical wounds, Other (Comment) (incisions)                       Personal Care Assistance Level of Assistance  Bathing, Feeding, Dressing Bathing Assistance: Maximum assistance Feeding assistance: Maximum assistance Dressing Assistance: Maximum assistance     Functional Limitations Info  Sight, Hearing, Speech Sight Info: Adequate Hearing Info: Adequate Speech Info: Adequate    SPECIAL CARE FACTORS FREQUENCY  PT (By licensed PT), OT (By licensed OT)                    Contractures      Additional Factors Info  Code Status Code Status Info: DNR             Current Medications (04/21/2015):  This is the current hospital active medication list Current Facility-Administered Medications  Medication Dose Route Frequency Provider Last Rate Last Dose  . 0.9 %  sodium chloride infusion   Intravenous Continuous Beryle Lathe, MD 1 mL/hr at 04/15/15  2110 1 mL/hr at 04/15/15 2110  . 0.9 %  sodium chloride infusion   Intravenous Once Renae FickleMackenzie Short, MD      . acetaminophen (TYLENOL) tablet 650 mg  650 mg Oral Q6H PRN Michael LitterNikki Carter, MD       Or  . acetaminophen (TYLENOL) suppository 650 mg  650 mg Rectal Q6H PRN Michael LitterNikki Carter, MD      . cloNIDine (CATAPRES) tablet 0.1 mg  0.1 mg Oral Once Delano Metzobert Schertz, MD      . dextrose 5 %-0.45 % sodium chloride infusion   Intravenous Continuous Renae FickleMackenzie Short,  MD 50 mL/hr at 04/20/15 2301    . fluconazole (DIFLUCAN) IVPB 100 mg  100 mg Intravenous Q24H Renae FickleMackenzie Short, MD   100 mg at 04/21/15 1709  . fluticasone furoate-vilanterol (BREO ELLIPTA) 100-25 MCG/INH 1 puff  1 puff Inhalation Daily Michael LitterNikki Carter, MD   1 puff at 04/21/15 1001  . furosemide (LASIX) 160 mg in dextrose 5 % 50 mL IVPB  160 mg Intravenous Q8H Primitivo GauzeMichael Mattingly, MD   160 mg at 04/20/15 1133  . heparin injection 5,000 Units  5,000 Units Subcutaneous 3 times per day Michael LitterNikki Carter, MD   5,000 Units at 04/21/15 1531  . hydrALAZINE (APRESOLINE) injection 10 mg  10 mg Intravenous Q4H PRN Renae FickleMackenzie Short, MD      . ipratropium-albuterol (DUONEB) 0.5-2.5 (3) MG/3ML nebulizer solution 3 mL  3 mL Nebulization Q6H PRN Jeralyn BennettEzequiel Zamora, MD      . metoprolol (LOPRESSOR) 1 MG/ML injection           . pantoprazole (PROTONIX) injection 40 mg  40 mg Intravenous Daily Renae FickleMackenzie Short, MD   40 mg at 04/21/15 1531  . piperacillin-tazobactam (ZOSYN) IVPB 2.25 g  2.25 g Intravenous 3 times per day [place] PinkNicholas P Gazda, RPH   2.25 g at 04/21/15 1531  . sodium chloride flush (NS) 0.9 % injection 10-40 mL  10-40 mL Intracatheter PRN Michael LitterNikki Carter, MD   20 mL at 04/21/15 0532  . sodium chloride flush (NS) 0.9 % injection 3 mL  3 mL Intravenous Q12H Michael LitterNikki Carter, MD   3 mL at 04/20/15 2300  . technetium TC 23M diethylenetriame-pentaacetic acid (DTPA) injection 30.8 milli Curie  30.8 milli Curie Inhalation Once PRN Jeralyn BennettEzequiel Zamora, MD         Discharge Medications: Please see discharge summary for a list of discharge medications.  Relevant Imaging Results:  Relevant Lab Results:   Additional Information SS#: [id number]  Loleta DickerJoyce S Shahiem Bedwell, LCSW

## 2015-04-21 NOTE — Clinical Social Work Note (Signed)
Clinical Social Work Assessment  Patient Details  Name: Elayne GuerinWilliam Crain MRN: [id number] Date of Birth: [birth year]  Date of referral:  04/21/15               Reason for consult:  Facility Placement                Permission sought to share information with:  Case Manager, Family Supports Permission granted to share information::  Yes, Verbal Permission Granted  Name::     Kathlen BrunswickWilliam Coleman   Agency::     Relationship::  Friend   Contact Information:  [phone]  Housing/Transportation Living arrangements for the past 2 months:  Skilled Nursing Facility ([facility]) Source of Information:  Friend/Neighbor Patient Interpreter Needed:  None Criminal Activity/Legal Involvement Pertinent to Current Situation/Hospitalization:  No - Comment as needed Significant Relationships:  Friend Lives with:  Facility Resident Do you feel safe going back to the place where you live?  Yes Need for family participation in patient care:  Yes (Comment)  Care giving concerns:  Friend Kathlen BrunswickWilliam Coleman reports that he would like an update regarding patient's status. Chrissie NoaWilliam reports that the patient has been in and out of the hospital for some time now. Chrissie NoaWilliam reports there are no concerns with the [facility] returning [facility] once medically stable and ready for discharge.    Social Worker assessment / plan:  CSW contacted patient's friend Kathlen BrunswickWilliam Coleman regarding discharge planning. CSW introduced self and explained role and responsibilities. Friend was very calm and cooperative with CSW assessment. Friend has requested that the patient return back [facility] once medically stable and ready for discharge. Friend reports patient has been a resident at the facility for 3 years. Prior to that he [facility]. CSW informed friend that CSW will contact facility to explore any concerns regarding the patient returning. CSW will then fax over requested patient information. CSW to  complete FL2 for MD signature.    Employment status:  Disabled (Comment on whether or not currently receiving Disability) Insurance information:  Medicare PT Recommendations:  Not assessed at this time Information / Referral to community resources:     Patient/Family's Response to care:  Friend reports he is the one that signs the patient into the facilities. He is agreeable with patient returning back to [facility].   Patient/Family's Understanding of and Emotional Response to Diagnosis, Current Treatment, and Prognosis:  Friend reports he would like an update regarding patient's status. CSW informed friend that once provided an updated CSW will contact him. Friend was very appreciative of CSW services. No other concerns reported at this time.   Emotional Assessment Appearance:  Other (Comment Required (unable to assess at this time) Attitude/Demeanor/Rapport:   (unable to assess at this time) Affect (typically observed):   (unable to assess at this time) Orientation:  Oriented to Self, Oriented to Place Alcohol / Substance use:  Not Applicable Psych involvement (Current and /or in the community):  No (Comment)  Discharge Needs  Concerns to be addressed:  Discharge Planning Concerns Readmission within the last 30 days:  No Current discharge risk:  Physical Impairment, Other (Requires further medical work up) Barriers to Discharge:  Continued Medical Work up   Newmont MiningJoyce S Brittay Mogle, LCSW 04/21/2015, 5:02 PM

## 2015-04-21 NOTE — Progress Notes (Signed)
[facility] PROGRESS NOTE  Craig [name] ZOX:[id number] DOB: 1945-09-15 DOA: 04/13/2015 PCP: Craig Boys, DO  Brief Summary  Craig [name] is a 70 year old gentleman with past medical history of peripheral vascular disease, chronic right toe ulcer, nonambulatory at baseline having poor functional status, severe protein malnutrition, who was seen by Craig [name] of vascular surgery on 03/30/2015. He was found to have frank pus draining from right second toe as it was recommended to him then that he be admitted to the hospital and evaluated by orthopedic surgery for amputation.  He refused to go to the emergency department. He has been treated with IV vancomycin and Zosyn in the outpatient setting.  He was admitted to the medicine service on 04/14/2015 presenting as a transfer from his skilled nursing facility for further evaluation of acute encephalopathy. On admission he had sepsis criteria with his source of infection likely to be from osteomyelitis. X-rays of his right foot showed lytic destruction of the second proximal phalanx consistent with acute osteomyelitis. Labs also revealed acute kidney injury with creatinine of 8.6, with previous lab work showing creatinine of 0.9 on 03/14/2015. Other issues include respiratory acidosis. An ABG revealed a pH of 7.198 with PCO2 of 63 and PaO2 of 73. Chest x-ray did not show acute infiltrate, VQ scan showed low probability for pulmonary embolism.  With regard to acute osteomyelitis, Craig [name] evaluated Craig [name] and for now recommended treatment with IV antibiotic therapy holding off on surgical intervention. He also stated that he if surgery needed to be done would likely require a above-the-knee amputation. Vancomycin had been discontinued on admission due to supratherapeutic levels. Nephrology recommended to daptomycin. Zosyn was continued.   Regards to acute kidney injury nephrology was consulted. His creatinine has trended up from 8.44 on admission  to 10.7 on 04/17/2015. Nephrology felt that this could be related to vancomycin-induced nephrotoxicity having vancomycin levels of 146. Bilateral renal ultrasound negative for hydronephrosis. Critical illness/sepsis, hypovolemia may also have contributed. Labs have showed stable potassium with bicarbonate of 25.   Assessment/Plan  Sepsis, sepsis physiology resolved, although leukocytosis persists.  Sepsis may have been due to osteomyelitis.  WBC down again today -  Continue IV Zosyn -  vancomycin level still elevated and no longer trending down very much -  consider starting daptomycin once subtherapeutic -  Remains afebrile, blood pressure stable. Blood cultures drawn 04/14/2015 showing no growth to date.  Acute hypoxemic hypercarbic respiratory failure, resolved.   - Chest x-ray performed in the emergency department did not reveal acute infiltrate. 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  [place]. -Nephrology assistance appreciated - Start HD 4/7.  HD Catheter placed by Craig. Briant [name]   Acute encephalopathy, now likely due to uremia.  Less somnolent -  Strict NPO, including medications pending repeat SLP evaluation -  Aspiration precautions  Elevated blood pressures -  Continue hydralazine prn -  Start scheduled metoprolol -  Will hold off on clonidine patch for now because it can be sedating and he is already encephalopathyic  Acute osteomyelitis with underlying PVD -Craig [name] now amenable to amputation if necessary -X-ray performed in the emergency department showing lytic destruction of the second proximal phalanx consistent with acute osteomyelitis. - CraigDuda of orthopedic surgery recommended continuing medical management for now  - Should his condition worsen, he would need an above-the-knee  amputation  Candidal urinary tract infection. -  Urine culture growing Pseudomonas and yeast - currently on Zosyn through at least 4/7 - Continue IV Diflucan through 4/14  Microcytic anemia, likely iron defiency, acute anemia today -  Monitor for evidence of GIB - He was transfused with 2 units of PRBC's on 04/15/2015 - Feraheme given 4/1 -  Transfuse 2 units again today  Chronic obstructive pulmonary disease, no longer wheezing - d/c steroids  Severe protein calorie malnutrition -Patient having a a pre-albumin level of 7.0. At baseline has a poor functional status - NPO pending swallow evaluation  Medical goals of care, Appreciate palliative care assistance, consult completed on 4/2  Leukocytosis, on broad spectrum abx.  May be related to his leg or early sign of C. Diff -  Monitor for diarrhea - CXR unremarkable - repeat UA  Diet:  NPO Access:  PIV IVF:  yes Proph:  heparin  Code Status: DNR Family Communication: patient and POA Craig [name] Disposition Plan:  Prognosis is poor.  Will attempt HD, but even if kidneys recover, he continues to have a gangrenous leg.    Consultants:  Nephrology  Orthopedic surgery  Palliative care  Procedures:  Started HD 4/7  Antibiotics:  Vancomycin ongoing due to elevated levels  Zosyn   Fluconazole 4/4  HPI/Subjective:  Patient less sleepy today but still confused and slurring his words.  Denies pain, shortness of breath.  Having some nausea without vomiting.    Objective: Filed Vitals:   04/21/15 1404 04/21/15 1428 04/21/15 1430 04/21/15 1602  BP: 185/80 192/76 185/73 190/78  Pulse: 70 73 68 72  Temp:   97.9 F (36.6 C) 98 F (36.7 C)  TempSrc:   Axillary Axillary  Resp:   14 16  Height:      Weight:   86.8 kg (191 lb 5.8 oz)   SpO2:   98% 98%    Intake/Output Summary (Last 24 hours) at 04/21/15 1654 Last data filed at 04/21/15 1603  Gross per 24 hour  Intake    716 ml  Output    710 ml  Net      6 ml    Filed Weights   04/20/15 1430 04/21/15 1130 04/21/15 1430  Weight: 89 kg (196 lb 3.4 oz) 86.6 kg (190 lb 14.7 oz) 86.8 kg (191 lb 5.8 oz)   Body mass index is 23.92 kg/(m^2).  Exam:   General:  Adult male, No acute distress, seen in HD, sleepy but able to answer some questions today  HEENT:  NCAT, MMM  Cardiovascular:  RRR, nl S1, S2 no mrg, 2+ pulses, warm extremities  Respiratory:  CTAB, no increased WOB  Abdomen:   NABS, soft, NT/ND  MSK:   Severe muscle wasting of bilateral lower extremities with smooth skin and trace amount of slow pitting edema.  Diminished pulses bilaterally.  Right foot with dried second toe.    Neuro:  Grossly moves all extremities and able to move legs a little bit to assist with exam.   Data Reviewed: Basic Metabolic Panel:  Recent Labs Lab 04/17/15 0815 04/18/15 1010 04/19/15 0440 04/20/15 0445 04/21/15 0532  NA 141 140 139 136 139  K 4.4 4.4 5.1 4.6 4.0  CL 104 104 103 100* 102  CO2 23 22 21* 20* 23  GLUCOSE 98 93 94 204* 91  BUN 94* 121* 136* 147* 106*  CREATININE 10.70* 11.48* 11.79* 12.17* 9.24*  CALCIUM 8.2* 8.5* 8.2* 8.3* 7.7*  PHOS 8.3*  --  11.1* 12.0* 8.3*   Liver Function  Tests:  Recent Labs Lab 04/15/15 1015 04/17/15 0815 04/19/15 0440 04/20/15 0445 04/21/15 0532  AST 10*  --   --   --   --   ALT 7*  --   --   --   --   ALKPHOS 35*  --   --   --   --   BILITOT 1.1  --   --   --   --   PROT 7.0  --   --   --   --   ALBUMIN 2.0* 1.9* 1.9* 2.2* 1.9*   No results for input(s): LIPASE, AMYLASE in the last 168 hours. No results for input(s): AMMONIA in the last 168 hours. CBC:  Recent Labs Lab 04/15/15 1015 04/16/15 0427 04/18/15 1010 04/20/15 0445 04/21/15 0532  WBC 8.5 9.5 12.7* 17.7* 11.7*  HGB 6.4* 8.2* 8.7* 8.1* 6.6*  HCT 21.3* 25.8* 27.5* 24.6* 19.7*  MCV 69.2* 71.9* 72.0* 71.9* 70.6*  PLT 261 248 267 272 209    Recent Results (from the past 240 hour(s))  Culture, blood (Routine X 2) w Reflex to  ID Panel     Status: None   Collection Time: 04/14/15  4:11 AM  Result Value Ref Range Status   Specimen Description BLOOD LEFT ARM  Final   Special Requests BOTTLES DRAWN AEROBIC ONLY  Final   Culture NO GROWTH 5 DAYS  Final   Report Status 04/19/2015 FINAL  Final  Culture, blood (Routine X 2) w Reflex to ID Panel     Status: None   Collection Time: 04/14/15  4:16 AM  Result Value Ref Range Status   Specimen Description BLOOD LEFT HAND  Final   Special Requests BOTTLES DRAWN AEROBIC ONLY  Final   Culture NO GROWTH 5 DAYS  Final   Report Status 04/19/2015 FINAL  Final  Urine culture     Status: None   Collection Time: 04/14/15  5:16 AM  Result Value Ref Range Status   Specimen Description URINE, SUPRAPUBIC  Final   Special Requests NONE  Final   Culture   Final    >=100,000 COLONIES/mL YEAST 40,000 COLONIES/ml PSEUDOMONAS AERUGINOSA    Report Status 04/16/2015 FINAL  Final   Organism ID, Bacteria PSEUDOMONAS AERUGINOSA  Final      Susceptibility   Pseudomonas aeruginosa - MIC*    CEFTAZIDIME 4 SENSITIVE Sensitive     CIPROFLOXACIN <=0.25 SENSITIVE Sensitive     GENTAMICIN 4 SENSITIVE Sensitive     IMIPENEM <=0.25 SENSITIVE Sensitive     PIP/TAZO <=4 SENSITIVE Sensitive     CEFEPIME 4 SENSITIVE Sensitive     * 40,000 COLONIES/ml PSEUDOMONAS AERUGINOSA  MRSA PCR Screening     Status: Abnormal   Collection Time: 04/14/15 10:03 AM  Result Value Ref Range Status   MRSA by PCR POSITIVE (A) NEGATIVE Final    Comment:        The GeneXpert MRSA Assay (FDA approved for NASAL specimens only), is one component of a comprehensive MRSA colonization surveillance program. It is not intended to diagnose MRSA infection nor to guide or monitor treatment for MRSA infections. RESULT CALLED TO, READ BACK BY AND VERIFIED WITH: RN Sherlean Foot AT [id number] MARTINB      Studies: Dg Chest Port 1 View  04/20/2015  CLINICAL DATA:  Central catheter placement EXAM: PORTABLE CHEST 1  VIEW COMPARISON:  April 15, 2015 FINDINGS: Right jugular catheter tip is in the superior vena cava. Right peripherally inserted central catheter  tip is in the superior vena cava. No pneumothorax. There is mild bibasilar atelectasis. Lungs elsewhere clear. Heart size and pulmonary vascularity are normal. No adenopathy. There is extensive atherosclerotic calcification in the aorta. IMPRESSION: Central catheter tips are in the superior vena cava. No pneumothorax. Mild bibasilar atelectasis. No edema or consolidation. Stable cardiac silhouette. Extensive atherosclerotic calcification in the aorta. Electronically Signed   By: Bretta BangWilliam  Woodruff III M.D.   On: 04/20/2015 11:34    Scheduled Meds: . sodium chloride   Intravenous Once  . cloNIDine  0.1 mg Oral Once  . fluconazole (DIFLUCAN) IV  100 mg Intravenous Q24H  . fluticasone furoate-vilanterol  1 puff Inhalation Daily  . furosemide  160 mg Intravenous Q8H  . heparin  5,000 Units Subcutaneous 3 times per day  . metoprolol      . pantoprazole (PROTONIX) IV  40 mg Intravenous Daily  . piperacillin-tazobactam (ZOSYN)  IV  2.25 g Intravenous 3 times per day  . sodium chloride flush  3 mL Intravenous Q12H   Continuous Infusions: . sodium chloride 1 mL/hr (04/15/15 2110)  . dextrose 5 % and 0.45% NaCl 50 mL/hr at 04/20/15 2301    Principal Problem:   Sepsis (HCC) Active Problems:   UTI (urinary tract infection)   Protein-calorie malnutrition, severe (HCC)   AKI (acute kidney injury) (HCC)   Failure to thrive in adult   Abscess of second toe of right foot   Osteomyelitis (HCC)    Time spent: 30 min    Elynore Dolinski  [facility] Pager [phone]. If 7PM-7AM, please contact night-coverage at [email or web address], password [email or web address] 04/21/2015, 4:54 PM  LOS: 8 days

## 2015-04-22 LAB — TYPE AND SCREEN
ABO/RH(D): O POS
Antibody Screen: NEGATIVE
Unit division: 0
Unit division: 0

## 2015-04-22 LAB — CBC
HCT: 28.5 % — ABNORMAL LOW (ref 39.0–52.0)
Hemoglobin: 9.4 g/dL — ABNORMAL LOW (ref 13.0–17.0)
MCH: 24.4 pg — ABNORMAL LOW (ref 26.0–34.0)
MCHC: 33 g/dL (ref 30.0–36.0)
MCV: 74 fL — ABNORMAL LOW (ref 78.0–100.0)
PLATELETS: 181 10*3/uL (ref 150–400)
RBC: 3.85 MIL/uL — ABNORMAL LOW (ref 4.22–5.81)
RDW: 23.4 % — AB (ref 11.5–15.5)
WBC: 14.2 10*3/uL — AB (ref 4.0–10.5)

## 2015-04-22 LAB — RENAL FUNCTION PANEL
ALBUMIN: 2 g/dL — AB (ref 3.5–5.0)
Anion gap: 13 (ref 5–15)
BUN: 53 mg/dL — AB (ref 6–20)
CALCIUM: 8 mg/dL — AB (ref 8.9–10.3)
CO2: 25 mmol/L (ref 22–32)
CREATININE: 5.66 mg/dL — AB (ref 0.61–1.24)
Chloride: 99 mmol/L — ABNORMAL LOW (ref 101–111)
GFR calc Af Amer: 11 mL/min — ABNORMAL LOW (ref >60)
GFR, EST NON AFRICAN AMERICAN: 9 mL/min — AB (ref >60)
GLUCOSE: 73 mg/dL (ref 65–99)
PHOSPHORUS: 5.4 mg/dL — AB (ref 2.5–4.6)
POTASSIUM: 2.9 mmol/L — AB (ref 3.5–5.1)
SODIUM: 137 mmol/L (ref 135–145)

## 2015-04-22 LAB — GLUCOSE, CAPILLARY
GLUCOSE-CAPILLARY: 82 mg/dL (ref 65–99)
GLUCOSE-CAPILLARY: 85 mg/dL (ref 65–99)
GLUCOSE-CAPILLARY: 97 mg/dL (ref 65–99)
Glucose-Capillary: 81 mg/dL (ref 65–99)
Glucose-Capillary: 75 mg/dL (ref 65–99)
Glucose-Capillary: 103 mg/dL — ABNORMAL HIGH (ref 65–99)

## 2015-04-22 MED ORDER — POTASSIUM CHLORIDE 10 MEQ/100ML IV SOLN
10.0000 meq | INTRAVENOUS | Status: AC
  Administered 2015-04-22 (×2): 10 meq via INTRAVENOUS
  Filled 2015-04-22 (×2): qty 100

## 2015-04-22 MED ORDER — CARVEDILOL 6.25 MG PO TABS
6.2500 mg | ORAL_TABLET | Freq: Two times a day (BID) | ORAL | Status: DC
  Administered 2015-04-22 – 2015-05-03 (×15): 6.25 mg via ORAL
  Filled 2015-04-22 (×22): qty 1

## 2015-04-22 MED ORDER — NEPRO/CARBSTEADY PO LIQD: 237.0000 mL | Freq: Two times a day (BID) | ORAL | Status: DC

## 2015-04-22 MED ORDER — AMLODIPINE BESYLATE 5 MG PO TABS
5.0000 mg | ORAL_TABLET | Freq: Every day | ORAL | Status: DC
  Administered 2015-04-22 – 2015-05-02 (×8): 5 mg via ORAL
  Filled 2015-04-22 (×11): qty 1

## 2015-04-22 NOTE — Progress Notes (Signed)
S: Wants to eat. O:BP 191/74 mmHg  Pulse 78  Temp(Src) 97.5 F (36.4 C) (Oral)  Resp 18  Ht 6\' 3"  (1.905 m)  Wt 86.9 kg (191 lb 9.3 oz)  BMI 23.95 kg/m2  SpO2 95%  Intake/Output Summary (Last 24 hours) at 04/22/15 0916 Last data filed at 04/22/15 0828  Gross per 24 hour  Intake    832 ml  Output    760 ml  Net     72 ml   Weight change: -3.4 kg (-7 lb 7.9 oz) XBJ:YNWGNGen:awake and more alert CVS:RRR no rub Resp: Decrease BS bases Abd: + BS NTND  + colostomy Ext: No edema.  Ischemic toes Rt foot NEURO:CNI.  Ox3 today  GU: suprapubic cath Rt IJ temp cath Rt PICC   . sodium chloride   Intravenous Once  . cloNIDine  0.1 mg Oral Once  . fluconazole (DIFLUCAN) IV  100 mg Intravenous Q24H  . fluticasone furoate-vilanterol  1 puff Inhalation Daily  . furosemide  160 mg Intravenous Q8H  . heparin  5,000 Units Subcutaneous 3 times per day  . pantoprazole (PROTONIX) IV  40 mg Intravenous Daily  . piperacillin-tazobactam (ZOSYN)  IV  2.25 g Intravenous 3 times per day  . potassium chloride  10 mEq Intravenous Q1 Hr x 2  . sodium chloride flush  3 mL Intravenous Q12H   Dg Chest Port 1 View  04/20/2015  CLINICAL DATA:  Central catheter placement EXAM: PORTABLE CHEST 1 VIEW COMPARISON:  April 15, 2015 FINDINGS: Right jugular catheter tip is in the superior vena cava. Right peripherally inserted central catheter tip is in the superior vena cava. No pneumothorax. There is mild bibasilar atelectasis. Lungs elsewhere clear. Heart size and pulmonary vascularity are normal. No adenopathy. There is extensive atherosclerotic calcification in the aorta. IMPRESSION: Central catheter tips are in the superior vena cava. No pneumothorax. Mild bibasilar atelectasis. No edema or consolidation. Stable cardiac silhouette. Extensive atherosclerotic calcification in the aorta. Electronically Signed   By: Bretta BangWilliam  Woodruff III M.D.   On: 04/20/2015 11:34   BMET    Component Value Date/Time   NA 137 04/22/2015 0528    NA 140 03/14/2015   K 2.9* 04/22/2015 0528   CL 99* 04/22/2015 0528   CO2 25 04/22/2015 0528   GLUCOSE 73 04/22/2015 0528   BUN 53* 04/22/2015 0528   BUN 12 03/14/2015   CREATININE 5.66* 04/22/2015 0528   CREATININE 0.9 03/14/2015   CALCIUM 8.0* 04/22/2015 0528   GFRNONAA 9* 04/22/2015 0528   GFRAA 11* 04/22/2015 0528   CBC    Component Value Date/Time   WBC 14.2* 04/22/2015 0528   WBC 8.6 03/14/2015   RBC 3.85* 04/22/2015 0528   RBC 3.99* 03/03/2015 0456   HGB 9.4* 04/22/2015 0528   HCT 28.5* 04/22/2015 0528   PLT 181 04/22/2015 0528   MCV 74.0* 04/22/2015 0528   MCH 24.4* 04/22/2015 0528   MCHC 33.0 04/22/2015 0528   RDW 23.4* 04/22/2015 0528   LYMPHSABS 0.7 04/14/2015 0935   MONOABS 0.1 04/14/2015 0935   EOSABS 0.0 04/14/2015 0935   BASOSABS 0.0 04/14/2015 0935     Assessment: 1. ARF ? Secondary to vanco toxicity.  UO improving.  Doing acute HD as expect renal fx to recover.  He is NOT a long term HD candidate 2. Osteomyelitis Rt  Foot on AB 3. Hypokalemia 4. HTN  Plan: 1. If Scr higher tomorrow then will consider HD 2. K replacement ordered 3. Start antiHTN 4.  Cont IV lasix 5. DC IV fluids when taking PO  Teddie Mehta T

## 2015-04-22 NOTE — Progress Notes (Signed)
Speech Language Pathology Treatment: Dysphagia  Patient Details Name: Craig GuerinWilliam [name] MRN: [id number] DOB: 06-07-1945 Today's Date: 04/22/2015 Time: [id number] SLP Time Calculation (min) (ACUTE ONLY): 10 min  Assessment / Plan / Recommendation Clinical Impression  Pt's mentation appears improved since initial SLP evaluation with improved alertness and oral preparation, although he needs coaxing to tolerate sitting in a more upright posture. No overt s/s of aspiration are observed with any consistency tested, although mastication remains prolonged with solid textures. Moderate oral residue remained in his oral cavity as he is asking to lay back down. Min cues provided for additional swallow to clear food particles. Recommend initiation of Dys 2 textures to facilitate oral phase and thin liquids. Will f/u for tolerance and advancement as warranted.   HPI HPI: 70 y.o. male with h/o COPD, atrial fibrillation, HTN, MI, GERD, hepatitis B, dysphagia, diverticulosis, sepsis, alcoholic cirrhosis of liver without ascites, CAD, thyroid disease, PVD and depression, who presented to ED due to gangrenous toe which needed amputation. Pt acute encephalopathic upon arrival with muffled speech. CXR 4/7 mild bibasilar atelectasis. Per chart, difficulty with pills observed and swallow evaluation ordered.      SLP Plan  Continue with current plan of care     Recommendations  Diet recommendations: Dysphagia 2 (fine chop);Thin liquid Liquids provided via: Cup;Straw Medication Administration: Whole meds with puree Supervision: Patient able to self feed;Full supervision/cueing for compensatory strategies Compensations: Slow rate;Small sips/bites;Follow solids with liquid Postural Changes and/or Swallow Maneuvers: Seated upright 90 degrees;Upright 30-60 min after meal             Oral Care Recommendations: Oral care BID Follow up Recommendations: Skilled Nursing facility Plan: Continue with current plan of  care     GO               Craig [name], M.A. CCC-SLP [phone]  Craig Hamaiewonsky, Craig [name] 04/22/2015, 11:19 AM

## 2015-04-22 NOTE — Progress Notes (Signed)
Somebody claimed to be a patient's sister,but the password she was giving is not the right one.Charhe nurse politely refused and divulged any information to the caller.

## 2015-04-22 NOTE — Progress Notes (Signed)
[facility] PROGRESS NOTE  Craig [name] ZOX:[id number] DOB: Jun 02, 1945 DOA: 04/13/2015 PCP: Kirt Boys, DO  Brief Summary  Craig [name] is a 70 year old gentleman with past medical history of peripheral vascular disease, chronic right toe ulcer, nonambulatory at baseline having poor functional status, severe protein malnutrition, who was seen by Dr Imogene Burn of vascular surgery on 03/30/2015. He was found to have frank pus draining from right second toe as it was recommended to him then that he be admitted to the hospital and evaluated by orthopedic surgery for amputation.  He refused to go to the emergency department. He has been treated with IV vancomycin and Zosyn in the outpatient setting.  He was admitted to the medicine service on 04/14/2015 presenting as a transfer from his skilled nursing facility for further evaluation of acute encephalopathy. On admission he had sepsis criteria with his source of infection likely to be from osteomyelitis. X-rays of his right foot showed lytic destruction of the second proximal phalanx consistent with acute osteomyelitis. Labs also revealed acute kidney injury with creatinine of 8.6, with previous lab work showing creatinine of 0.9 on 03/14/2015. Other issues include respiratory acidosis. An ABG revealed a pH of 7.198 with PCO2 of 63 and PaO2 of 73. Chest x-ray did not show acute infiltrate, VQ scan showed low probability for pulmonary embolism.  With regard to acute osteomyelitis, Dr Lajoyce Corners evaluated Craig [name] and for now recommended treatment with IV antibiotic therapy holding off on surgical intervention. He also stated that he if surgery needed to be done would likely require a above-the-knee amputation. Vancomycin had been discontinued on admission due to supratherapeutic levels. Nephrology recommended to daptomycin. Zosyn was continued.   Regards to acute kidney injury nephrology was consulted. His creatinine has trended up from 8.44 on admission  to 10.7 on 04/17/2015. Nephrology felt that this could be related to vancomycin-induced nephrotoxicity having vancomycin levels of 146. Bilateral renal ultrasound negative for hydronephrosis. Critical illness/sepsis, hypovolemia may also have contributed. Labs have showed stable potassium with bicarbonate of 25.   Assessment/Plan  Sepsis, sepsis physiology resolved, although leukocytosis persists.  Sepsis may have been due to osteomyelitis.  WBC fluctuating -  Continue IV Zosyn -  vancomycin level still elevated and no longer trending down very much -  consider starting daptomycin once subtherapeutic -  Remains afebrile, blood pressure stable. Blood cultures drawn 04/14/2015 showing no growth to date.  Acute hypoxemic hypercarbic respiratory failure, resolved.   - Chest x-ray performed in the emergency department did not reveal acute infiltrate. 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   -Nephrology assistance appreciated - Started HD 4/7.  Temp HD Catheter placed by Dr. Briant Cedar same date -  Continue IVF and lasix per nephrology  Acute encephalopathy, due to uremia, resolving with HD -  Repeat SLP evaluation, advance diet -  Aspiration precautions -  Resume oral medications  Borderline hypoglycemia -  Diet being advanced -  No change to IVF at this time  Elevated blood pressures -  Continue hydralazine prn -  D/c metoprolol -  Start carvedilol -  norvasc added by cardiology  Acute osteomyelitis with underlying PVD -Craig [name] now amenable to amputation if necessary -X-ray performed in the emergency department showing lytic destruction of the second proximal phalanx consistent with acute osteomyelitis. - Dr.Duda of orthopedic surgery recommended continuing medical management for now  - Should his  condition worsen, he would need an above-the-knee amputation  Candidal urinary tract infection. - Urine culture growing Pseudomonas and yeast - currently on Zosyn through at least 4/7 - Continue IV Diflucan through 4/14  Microcytic anemia, likely iron defiency, acute anemia today -  Monitor for evidence of GIB - He was transfused with 2 units of PRBC's on 04/15/2015 - Feraheme given 4/1 -  Transfuse 2 units again today  Chronic obstructive pulmonary disease, mild wheezing - monitor  Severe protein calorie malnutrition -Patient having a a pre-albumin level of 7.0. At baseline has a poor functional status - start supplements  Medical goals of care, Appreciate palliative care assistance, consult completed on 4/2  Leukocytosis, on broad spectrum abx.  May be related to his leg or early sign of C. Diff -  Monitor for diarrhea - CXR unremarkable - repeat UA not obtained  Stage 1 pressure ulcer on heel.  Dry gangrene of foot as above  Hypokalemia due to dialysis -  IV potassium once  Diet:  Dysphagia 2 Access:  PIV IVF:  yes Proph:  heparin  Code Status: DNR Family Communication: patient and POA Craig. Effie [name] Disposition Plan:  Prognosis is poor.  Will attempt HD, but even if kidneys recover, he continues to have a gangrenous leg.    Consultants:  Nephrology  Orthopedic surgery  Palliative care  Procedures:  Started HD 4/7  Antibiotics:  Vancomycin ongoing due to elevated levels  Zosyn   Fluconazole 4/4  HPI/Subjective:  Legs both hurt today.  Denies shortness of breath, nausea, vomiting  Objective: Filed Vitals:   04/21/15 1602 04/21/15 2112 04/22/15 0629 04/22/15 0828  BP: 190/78 198/71 186/66 191/74  Pulse: 72 73 70 78  Temp: 98 F (36.7 C) 99 F (37.2 C) 97.6 F (36.4 C) 97.5 F (36.4 C)  TempSrc: Axillary Axillary Oral Oral  Resp: Height:      Weight:  86.9 kg (191 lb 9.3 oz)    SpO2: 98% 100% 99% 95%    Intake/Output  Summary (Last 24 hours) at 04/22/15 1543 Last data filed at 04/22/15 1446  Gross per 24 hour  Intake    892 ml  Output   1975 ml  Net  -1083 ml   Filed Weights   04/21/15 1130 04/21/15 1430 04/21/15 2112  Weight: 86.6 kg (190 lb 14.7 oz) 86.8 kg (191 lb 5.8 oz) 86.9 kg (191 lb 9.3 oz)   Body mass index is 23.95 kg/(m^2).  Exam:   General:  Adult male, No acute distress, awake and answering questions   HEENT:  NCAT, MMM  Cardiovascular:  RRR, nl S1, S2 no mrg, 2+ pulses, warm extremities  Respiratory:  CTAB, no increased WOB  Abdomen:   NABS, soft, NT/ND  MSK:   Severe muscle wasting of bilateral lower extremities with smooth skin and trace amount of slow pitting edema.  Diminished pulses bilaterally.  Right foot with dried second toe.  Mild redness to right heel  Neuro:  Grossly moves all extremities and able to move legs a little bit to assist with exam.   Data Reviewed: Basic Metabolic Panel:  Recent Labs Lab 04/17/15 0815 04/18/15 1010 04/19/15 0440 04/20/15 0445 04/21/15 0532 04/22/15 0528  NA 141 140 139 136 139 137  K 4.4 4.4 5.1 4.6 4.0 2.9*  CL 104 104 103 100* 102 99*  CO2 23 22 21* 20* 23 25  GLUCOSE 98 93 94 204* 91 73  BUN 94* 121*  136* 147* 106* 53*  CREATININE 10.70* 11.48* 11.79* 12.17* 9.24* 5.66*  CALCIUM 8.2* 8.5* 8.2* 8.3* 7.7* 8.0*  PHOS 8.3*  --  11.1* 12.0* 8.3* 5.4*   Liver Function Tests:  Recent Labs Lab 04/17/15 0815 04/19/15 0440 04/20/15 0445 04/21/15 0532 04/22/15 0528  ALBUMIN 1.9* 1.9* 2.2* 1.9* 2.0*   No results for input(s): LIPASE, AMYLASE in the last 168 hours. No results for input(s): AMMONIA in the last 168 hours. CBC:  Recent Labs Lab 04/16/15 0427 04/18/15 1010 04/20/15 0445 04/21/15 0532 04/22/15 0528  WBC 9.5 12.7* 17.7* 11.7* 14.2*  HGB 8.2* 8.7* 8.1* 6.6* 9.4*  HCT 25.8* 27.5* 24.6* 19.7* 28.5*  MCV 71.9* 72.0* 71.9* 70.6* 74.0*  PLT 248 267 272 209 181    Recent Results (from the past 240  hour(s))  Culture, blood (Routine X 2) w Reflex to ID Panel     Status: None   Collection Time: 04/14/15  4:11 AM  Result Value Ref Range Status   Specimen Description BLOOD LEFT ARM  Final   Special Requests BOTTLES DRAWN AEROBIC ONLY  Final   Culture NO GROWTH 5 DAYS  Final   Report Status 04/19/2015 FINAL  Final  Culture, blood (Routine X 2) w Reflex to ID Panel     Status: None   Collection Time: 04/14/15  4:16 AM  Result Value Ref Range Status   Specimen Description BLOOD LEFT HAND  Final   Special Requests BOTTLES DRAWN AEROBIC ONLY  Final   Culture NO GROWTH 5 DAYS  Final   Report Status 04/19/2015 FINAL  Final  Urine culture     Status: None   Collection Time: 04/14/15  5:16 AM  Result Value Ref Range Status   Specimen Description URINE, SUPRAPUBIC  Final   Special Requests NONE  Final   Culture   Final    >=100,000 COLONIES/mL YEAST 40,000 COLONIES/ml PSEUDOMONAS AERUGINOSA    Report Status 04/16/2015 FINAL  Final   Organism ID, Bacteria PSEUDOMONAS AERUGINOSA  Final      Susceptibility   Pseudomonas aeruginosa - MIC*    CEFTAZIDIME 4 SENSITIVE Sensitive     CIPROFLOXACIN <=0.25 SENSITIVE Sensitive     GENTAMICIN 4 SENSITIVE Sensitive     IMIPENEM <=0.25 SENSITIVE Sensitive     PIP/TAZO <=4 SENSITIVE Sensitive     CEFEPIME 4 SENSITIVE Sensitive     * 40,000 COLONIES/ml PSEUDOMONAS AERUGINOSA  MRSA PCR Screening     Status: Abnormal   Collection Time: 04/14/15 10:03 AM  Result Value Ref Range Status   MRSA by PCR POSITIVE (A) NEGATIVE Final    Comment:        The GeneXpert MRSA Assay (FDA approved for NASAL specimens only), is one component of a comprehensive MRSA colonization surveillance program. It is not intended to diagnose MRSA infection nor to guide or monitor treatment for MRSA infections. RESULT CALLED TO, READ BACK BY AND VERIFIED WITH: RN Sherlean Foot AT 1220 [id number] MARTINB      Studies: No results found.  Scheduled Meds: .  amLODipine  5 mg Oral Daily  . carvedilol  6.25 mg Oral BID WC  . fluconazole (DIFLUCAN) IV  100 mg Intravenous Q24H  . fluticasone furoate-vilanterol  1 puff Inhalation Daily  . furosemide  160 mg Intravenous Q8H  . heparin  5,000 Units Subcutaneous 3 times per day  . pantoprazole (PROTONIX) IV  40 mg Intravenous Daily  . piperacillin-tazobactam (ZOSYN)  IV  2.25 g Intravenous 3  times per day  . sodium chloride flush  3 mL Intravenous Q12H   Continuous Infusions: . dextrose 5 % and 0.45% NaCl 50 mL/hr at 04/21/15 2359    Principal Problem:   Sepsis (HCC) Active Problems:   UTI (urinary tract infection)   Protein-calorie malnutrition, severe (HCC)   AKI (acute kidney injury) (HCC)   Failure to thrive in adult   Abscess of second toe of right foot   Osteomyelitis (HCC)    Time spent: 30 min    Trajan Grove  [facility] Pager [phone]. If 7PM-7AM, please contact night-coverage at [email or web address], password [email or web address] 04/22/2015, 3:43 PM  LOS: 9 days

## 2015-04-23 LAB — CBC
HEMATOCRIT: 26.9 % — AB (ref 39.0–52.0)
HEMOGLOBIN: 8.8 g/dL — AB (ref 13.0–17.0)
MCH: 24.2 pg — AB (ref 26.0–34.0)
MCHC: 32.7 g/dL (ref 30.0–36.0)
MCV: 73.9 fL — ABNORMAL LOW (ref 78.0–100.0)
Platelets: 181 10*3/uL (ref 150–400)
RBC: 3.64 MIL/uL — ABNORMAL LOW (ref 4.22–5.81)
RDW: 23.8 % — AB (ref 11.5–15.5)
WBC: 14.5 10*3/uL — ABNORMAL HIGH (ref 4.0–10.5)

## 2015-04-23 LAB — RENAL FUNCTION PANEL
ALBUMIN: 2 g/dL — AB (ref 3.5–5.0)
ANION GAP: 14 (ref 5–15)
BUN: 60 mg/dL — AB (ref 6–20)
CO2: 25 mmol/L (ref 22–32)
Calcium: 8.1 mg/dL — ABNORMAL LOW (ref 8.9–10.3)
Chloride: 100 mmol/L — ABNORMAL LOW (ref 101–111)
Creatinine, Ser: 6.6 mg/dL — ABNORMAL HIGH (ref 0.61–1.24)
GFR calc Af Amer: 9 mL/min — ABNORMAL LOW (ref >60)
GFR calc non Af Amer: 8 mL/min — ABNORMAL LOW (ref >60)
GLUCOSE: 80 mg/dL (ref 65–99)
PHOSPHORUS: 6.4 mg/dL — AB (ref 2.5–4.6)
POTASSIUM: 2.9 mmol/L — AB (ref 3.5–5.1)
SODIUM: 139 mmol/L (ref 135–145)

## 2015-04-23 LAB — GLUCOSE, CAPILLARY
GLUCOSE-CAPILLARY: 78 mg/dL (ref 65–99)
GLUCOSE-CAPILLARY: 92 mg/dL (ref 65–99)
Glucose-Capillary: 84 mg/dL (ref 65–99)

## 2015-04-23 MED ORDER — PRO-STAT SUGAR FREE PO LIQD
30.0000 mL | Freq: Three times a day (TID) | ORAL | Status: DC
Start: 2015-04-23 — End: 2015-05-03
  Administered 2015-04-23 – 2015-04-29 (×4): 30 mL via ORAL
  Filled 2015-04-23 (×12): qty 30

## 2015-04-23 MED ORDER — BOOST / RESOURCE BREEZE PO LIQD
1.0000 | Freq: Three times a day (TID) | ORAL | Status: DC
  Administered 2015-04-23 – 2015-05-03 (×16): 1 via ORAL

## 2015-04-23 MED ORDER — POTASSIUM CHLORIDE CRYS ER 20 MEQ PO TBCR
40.0000 meq | EXTENDED_RELEASE_TABLET | Freq: Two times a day (BID) | ORAL | Status: AC
Start: 2015-04-23 — End: 2015-04-23
  Administered 2015-04-23 (×2): 40 meq via ORAL
  Filled 2015-04-23 (×2): qty 2

## 2015-04-23 MED ORDER — DARBEPOETIN ALFA 150 MCG/0.3ML IJ SOSY
150.0000 ug | PREFILLED_SYRINGE | INTRAMUSCULAR | Status: DC
  Administered 2015-04-24: 150 ug via SUBCUTANEOUS
  Filled 2015-04-23 (×2): qty 0.3

## 2015-04-23 MED ORDER — DARBEPOETIN ALFA 150 MCG/0.3ML IJ SOSY: 150.0000 ug | PREFILLED_SYRINGE | INTRAMUSCULAR | Status: DC

## 2015-04-23 NOTE — Progress Notes (Signed)
Nutrition Follow-up  DOCUMENTATION CODES:   Severe malnutrition in context of chronic illness  INTERVENTION:  Provide Boost Breeze po TID, each supplement provides 250 kcal and 9 grams of protein.  Provide 30 ml Prostat po TID, each supplement provides 100 kcal and 15 grams of protein.   Discontinue Nepro shake due to poor acceptance.  Encourage adequate PO intake.  NUTRITION DIAGNOSIS:   Increased nutrient needs related to wound healing as evidenced by estimated needs; ongoing  GOAL:   Patient will meet greater than or equal to 90% of their needs; not met  MONITOR:   PO intake, Supplement acceptance, Labs, Diet advancement (Direction of care)  REASON FOR ASSESSMENT:   Consult Assessment of nutrition requirement/status  ASSESSMENT:   Craig [name] is a 70 y.o. gentleman with a history of COPD, atrial fibrillation, PVD, malnutrition, and chronic wounds who is a long-term SNF patient (according to his emergency contact Jac Canavan, the patient has a sister and brother that have been estranged for the past several years). Temp Right IJ HD cath placed on 4/7. Per MD note, pt not a candidate for long term dialysis. Pt has HD 4/7 and 4/8.   Appetite reported fine during time of visit, however meal completion has been 10-25%. Pt currently has Nepro Shake ordered and has been refusing them. He reports not liking Nepro/Ensure milkshake products. Pt is agreeable to [employer]. RD to modify orders. Will additionally order Prostat to aid in protein needs. Pt was encouraged to eat his food at meals and to drink his supplements.   Labs: Low potassium, chloride, calcium, and GFR. High BUN, creatinine, and phosphorous (6.4).  Diet Order:  DIET DYS 3 Room service appropriate?: Yes with Assist; Fluid consistency:: Thin  Skin:  Wound (see comment) (Stage I on R heel, incision on R toe)  Last BM:  4/10  Height:   Ht Readings from Last 1 Encounters:  04/13/15 6' 3"  (1.905 m)     Weight:   Wt Readings from Last 1 Encounters:  04/21/15 191 lb 9.3 oz (86.9 kg)    Ideal Body Weight:  89.1 kg  BMI:  Body mass index is 23.95 kg/(m^2).  Estimated Nutritional Needs:   Kcal:  2100-2300  Protein:  105-120 grams  Fluid:  per MD  EDUCATION NEEDS:   No education needs identified at this time  Corrin Parker, MS, RD, LDN Pager # [phone] After hours/ weekend pager # [phone]

## 2015-04-23 NOTE — Progress Notes (Signed)
[facility] PROGRESS NOTE  Craig GuerinWilliam [name] ZOX:[id number] DOB: March 23, 1945 DOA: 04/13/2015 PCP: Kirt Boysarter, Monica, DO  Brief Summary  Craig [name] is a 70 year old gentleman with past medical history of peripheral vascular disease, chronic right toe ulcer, nonambulatory at baseline having poor functional status, severe protein malnutrition, who was seen by Dr Imogene Burnhen of vascular surgery on 03/30/2015. He was found to have frank pus draining from right second toe as it was recommended to him then that he be admitted to the hospital and evaluated by orthopedic surgery for amputation.  He refused to go to the emergency department. He has been treated with IV vancomycin and Zosyn in the outpatient setting.  He was admitted to the medicine service on 04/14/2015 presenting as a transfer from his skilled nursing facility for further evaluation of acute encephalopathy. On admission he had sepsis criteria with his source of infection likely to be from osteomyelitis. X-rays of his right foot showed lytic destruction of the second proximal phalanx consistent with acute osteomyelitis. Labs also revealed acute kidney injury with creatinine of 8.6, with previous lab work showing creatinine of 0.9 on 03/14/2015. Other issues include respiratory acidosis. An ABG revealed a pH of 7.198 with PCO2 of 63 and PaO2 of 73. Chest x-ray did not show acute infiltrate, VQ scan showed low probability for pulmonary embolism.  With regard to acute osteomyelitis, Dr Lajoyce Cornersuda evaluated Craig. Craig [name] and for now recommended treatment with IV antibiotic therapy holding off on surgical intervention. He also stated that he if surgery needed to be done would likely require a above-the-knee amputation. Vancomycin had been discontinued on admission due to supratherapeutic levels. Nephrology recommended to daptomycin. Zosyn was continued.   Regards to acute kidney injury nephrology was consulted. His creatinine has trended up from 8.44 on admission  to 10.7 on 04/17/2015. Nephrology felt that this could be related to vancomycin-induced nephrotoxicity having vancomycin levels of 146. Bilateral renal ultrasound negative for hydronephrosis. Critical illness/sepsis, hypovolemia may also have contributed. Labs have showed stable potassium with bicarbonate of 25.   Assessment/Plan  Sepsis, sepsis physiology resolved, although leukocytosis persists.  Sepsis may have been due to osteomyelitis.  WBC fluctuating -  Continue IV Zosyn -  vancomycin level again tomorrow, 147 >> 116  -  consider starting daptomycin once subtherapeutic -  Remains afebrile, blood pressure stable. Blood cultures drawn 04/14/2015 showing no growth to date.  Acute hypoxemic hypercarbic respiratory failure, resolved.   - Chest x-ray performed in the emergency department did not reveal acute infiltrate. 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   -Nephrology assistance appreciated - Started HD 4/7.  Temp HD Catheter placed by Dr. Briant CedarMattingly same date -  D/c IVF and lasix  Acute encephalopathy, due to uremia, resolving with HD -  Appreciate SLP assistance -  Aspiration precautions -  Resume oral medications  Borderline hypoglycemia -  Diet advanced -  D/C IVF -  Check frequent CBG and initiate hypoglycemia protocol prn  Elevated blood pressures, trending down slightly  -  Continue hydralazine prn -  Continue carvedilol and norvasc  Acute osteomyelitis with underlying PVD -Craig. Craig [name] now amenable to amputation if necessary -X-ray performed in the emergency department showing lytic destruction of the second proximal phalanx consistent with acute osteomyelitis. - Dr.Duda of orthopedic surgery recommended continuing medical management for now  - Should his condition worsen, he would need an  above-the-knee  amputation  Candidal urinary tract infection. - Urine culture growing Pseudomonas and yeast - currently on Zosyn through at least 4/7 - Continue IV Diflucan through 4/14  Microcytic anemia, likely iron defiency, but may also be due to zosyn -  Monitor for evidence of GIB - He was transfused with 2 units of PRBC's on 04/15/2015 and again on 4/8 - Feraheme given 4/1  Chronic obstructive pulmonary disease, mild wheezing - albuterol prn  Severe protein calorie malnutrition -Patient having a a pre-albumin level of 7.0. At baseline has a poor functional status - start supplements  Medical goals of care, Appreciate palliative care assistance, consult completed on 4/2  Leukocytosis, on broad spectrum abx.  May be related to his leg or early sign of C. Diff -  Monitor for diarrhea - CXR unremarkable - repeat UA not obtained  Stage 1 pressure ulcer on heel and sacrum.  -  allevyn dressings  Hypokalemia due to dialysis -  Oral potassium repletion today per nephrology  Diet:  Dysphagia 2 Access:  PIV IVF:  off Proph:  heparin  Code Status: DNR Family Communication: patient alone Disposition Plan:  Anticipate prolonged hospitalization as kidneys gradually recover from vancomycin toxicity  Consultants:  Nephrology  Orthopedic surgery  Palliative care  Procedures:  Started HD 4/7  Antibiotics:  Vancomycin ongoing due to elevated levels  Zosyn   Fluconazole 4/4  HPI/Subjective:  Denies pain today.  Denies shortness of breath, nausea, vomiting  Objective: Filed Vitals:   04/22/15 1930 04/23/15 0403 04/23/15 0850 04/23/15 1003  BP: 167/58 175/60  170/63  Pulse: 73 70  73  Temp: 98.7 F (37.1 C) 98.4 F (36.9 C)  98.5 F (36.9 C)  TempSrc: Oral Oral  Oral  Resp: Height:      Weight:      SpO2: 96% 100% 92% 99%    Intake/Output Summary (Last 24 hours) at 04/23/15 1303 Last data filed at 04/23/15 0600  Gross per 24 hour  Intake    1016 ml  Output    925 ml  Net     91 ml   Filed Weights   04/21/15 1130 04/21/15 1430 04/21/15 2112  Weight: 86.6 kg (190 lb 14.7 oz) 86.8 kg (191 lb 5.8 oz) 86.9 kg (191 lb 9.3 oz)   Body mass index is 23.95 kg/(m^2).  Exam:   General:  Adult male, No acute distress, awake and answering questions   HEENT:  NCAT, MMM  Cardiovascular:  RRR, nl S1, S2 no mrg, 2+ pulses, warm extremities  Respiratory:  CTAB, no increased WOB  Abdomen:   NABS, soft, NT/ND  MSK:   Severe muscle wasting of bilateral lower extremities with smooth skin and trace amount of slow pitting edema.  Diminished pulses bilaterally.  Right foot with dried second toe.  Mild redness to right heel  Neuro:  Grossly moves all extremities and able to move legs a little bit to assist with exam.   Data Reviewed: Basic Metabolic Panel:  Recent Labs Lab 04/19/15 0440 04/20/15 0445 04/21/15 0532 04/22/15 0528 04/23/15 0525  NA 139 136 139 137 139  K 5.1 4.6 4.0 2.9* 2.9*  CL 103 100* 102 99* 100*  CO2 21* 20* GLUCOSE 94 204* 91 73 80  BUN 136* 147* 106* 53* 60*  CREATININE 11.79* 12.17* 9.24* 5.66* 6.60*  CALCIUM 8.2* 8.3* 7.7* 8.0* 8.1*  PHOS 11.1* 12.0* 8.3* 5.4* 6.4*   Liver Function Tests:  Recent Labs Lab 04/19/15 0440 04/20/15 0445 04/21/15 0532 04/22/15 0528 04/23/15 0525  ALBUMIN 1.9* 2.2* 1.9* 2.0* 2.0*   No results for input(s): LIPASE, AMYLASE in the last 168 hours. No results for input(s): AMMONIA in the last 168 hours. CBC:  Recent Labs Lab 04/18/15 1010 04/20/15 0445 04/21/15 0532 04/22/15 0528 04/23/15 0525  WBC 12.7* 17.7* 11.7* 14.2* 14.5*  HGB 8.7* 8.1* 6.6* 9.4* 8.8*  HCT 27.5* 24.6* 19.7* 28.5* 26.9*  MCV 72.0* 71.9* 70.6* 74.0* 73.9*  PLT 267 272 209 181 181    Recent Results (from the past 240 hour(s))  Culture, blood (Routine X 2) w Reflex to ID Panel     Status: None   Collection Time: 04/14/15  4:11 AM  Result Value Ref Range Status   Specimen  Description BLOOD LEFT ARM  Final   Special Requests BOTTLES DRAWN AEROBIC ONLY  Final   Culture NO GROWTH 5 DAYS  Final   Report Status 04/19/2015 FINAL  Final  Culture, blood (Routine X 2) w Reflex to ID Panel     Status: None   Collection Time: 04/14/15  4:16 AM  Result Value Ref Range Status   Specimen Description BLOOD LEFT HAND  Final   Special Requests BOTTLES DRAWN AEROBIC ONLY  Final   Culture NO GROWTH 5 DAYS  Final   Report Status 04/19/2015 FINAL  Final  Urine culture     Status: None   Collection Time: 04/14/15  5:16 AM  Result Value Ref Range Status   Specimen Description URINE, SUPRAPUBIC  Final   Special Requests NONE  Final   Culture   Final    >=100,000 COLONIES/mL YEAST 40,000 COLONIES/ml PSEUDOMONAS AERUGINOSA    Report Status 04/16/2015 FINAL  Final   Organism ID, Bacteria PSEUDOMONAS AERUGINOSA  Final      Susceptibility   Pseudomonas aeruginosa - MIC*    CEFTAZIDIME 4 SENSITIVE Sensitive     CIPROFLOXACIN <=0.25 SENSITIVE Sensitive     GENTAMICIN 4 SENSITIVE Sensitive     IMIPENEM <=0.25 SENSITIVE Sensitive     PIP/TAZO <=4 SENSITIVE Sensitive     CEFEPIME 4 SENSITIVE Sensitive     * 40,000 COLONIES/ml PSEUDOMONAS AERUGINOSA  MRSA PCR Screening     Status: Abnormal   Collection Time: 04/14/15 10:03 AM  Result Value Ref Range Status   MRSA by PCR POSITIVE (A) NEGATIVE Final    Comment:        The GeneXpert MRSA Assay (FDA approved for NASAL specimens only), is one component of a comprehensive MRSA colonization surveillance program. It is not intended to diagnose MRSA infection nor to guide or monitor treatment for MRSA infections. RESULT CALLED TO, READ BACK BY AND VERIFIED WITH: RN Sherlean Foot AT 1220 [id number] MARTINB      Studies: No results found.  Scheduled Meds: . amLODipine  5 mg Oral Daily  . carvedilol  6.25 mg Oral BID WC  . [START ON 04/24/2015] darbepoetin (ARANESP) injection - DIALYSIS  150 mcg Intravenous Q Tue-HD  .  feeding supplement (NEPRO CARB STEADY)  237 mL Oral BID BM  . fluconazole (DIFLUCAN) IV  100 mg Intravenous Q24H  . fluticasone furoate-vilanterol  1 puff Inhalation Daily  . heparin  5,000 Units Subcutaneous 3 times per day  . pantoprazole (PROTONIX) IV  40 mg Intravenous Daily  . piperacillin-tazobactam (ZOSYN)  IV  2.25 g Intravenous 3 times per day  . potassium chloride  40 mEq Oral BID  . sodium  chloride flush  3 mL Intravenous Q12H   Continuous Infusions:    Principal Problem:   Sepsis (HCC) Active Problems:   UTI (urinary tract infection)   Protein-calorie malnutrition, severe (HCC)   AKI (acute kidney injury) (HCC)   Failure to thrive in adult   Abscess of second toe of right foot   Osteomyelitis (HCC)    Time spent: 30 min    Debra Calabretta  [facility] Pager [phone]. If 7PM-7AM, please contact night-coverage at [email or web address], password [facility] 04/23/2015, 1:03 PM  LOS: 10 days

## 2015-04-23 NOTE — Progress Notes (Signed)
Subjective:  Reasonable urine output at 1 L last 24 hours- creatinine however is rising Objective Vital signs in last 24 hours: Filed Vitals:   04/22/15 1718 04/22/15 1930 04/23/15 0403 04/23/15 0850  BP: 165/80 167/58 175/60   Pulse: 80 73 70   Temp: 97.9 F (36.6 C) 98.7 F (37.1 C) 98.4 F (36.9 C)   TempSrc: Oral Oral Oral   Resp: 18 18 18    Height:      Weight:      SpO2: 94% 96% 100% 92%   Weight change:   Intake/Output Summary (Last 24 hours) at 04/23/15 [id number] Last data filed at 04/23/15 0600  Gross per 24 hour  Intake   1016 ml  Output   1425 ml  Net   -409 ml    Assessment/ Plan: Pt is a 70 y.o. yo male who was admitted on 04/13/2015 with foot ulcer [name]/osteo- developed AKI insetting of supratherapeutic vancomycin levels Assessment/Plan: 1. Renal- baseline renal function normal. Developed AKI the setting of foot ulcer/ostial and supratherapeutic vancomycin levels. He is now nonoliguric. He is not a candidate for long-term dialysis support, however, during the course of this hospitalization doing dialysis PRN for knee. Had HD on 4/7 and 4/8. No indications for treatment today. We'll continue to follow labs  2. Foot ulcer/osteo- medical management for now. However, in conversations with palliative care patient has consented to amputation if absolutely necessary 3. Anemia- status post Feraheme as well as transfusions- most recently on 4/8. Will add ESA 4. HTN/volume- is nonoliguric and hypertensive. On Norvasc, Coreg and still on Lasix 5. Hypokalemia- will replete  Marilynn Ekstein A    Labs: Basic Metabolic Panel:  Recent Labs Lab 04/21/15 0532 04/22/15 0528 04/23/15 0525  NA 139 137 139  K 4.0 2.9* 2.9*  CL 102 99* 100*  CO2 23 25 25   GLUCOSE 91 73 80  BUN 106* 53* 60*  CREATININE 9.24* 5.66* 6.60*  CALCIUM 7.7* 8.0* 8.1*  PHOS 8.3* 5.4* 6.4*   Liver Function Tests:  Recent Labs Lab 04/21/15 0532 04/22/15 0528 04/23/15 0525  ALBUMIN 1.9* 2.0* 2.0*    No results for input(s): LIPASE, AMYLASE in the last 168 hours. No results for input(s): AMMONIA in the last 168 hours. CBC:  Recent Labs Lab 04/18/15 1010 04/20/15 0445 04/21/15 0532 04/22/15 0528 04/23/15 0525  WBC 12.7* 17.7* 11.7* 14.2* 14.5*  HGB 8.7* 8.1* 6.6* 9.4* 8.8*  HCT 27.5* 24.6* 19.7* 28.5* 26.9*  MCV 72.0* 71.9* 70.6* 74.0* 73.9*  PLT 267 272 209 181 181   Cardiac Enzymes: No results for input(s): CKTOTAL, CKMB, CKMBINDEX, TROPONINI in the last 168 hours. CBG:  Recent Labs Lab 04/22/15 0827 04/22/15 1201 04/22/15 1716 04/22/15 1929 04/22/15 2335  GLUCAP 82 75 103* 97 85    Iron Studies: No results for input(s): IRON, TIBC, TRANSFERRIN, FERRITIN in the last 72 hours. Studies/Results: No results found. Medications: Infusions: . dextrose 5 % and 0.45% NaCl 50 mL/hr at 04/23/15 0239    Scheduled Medications: . amLODipine  5 mg Oral Daily  . carvedilol  6.25 mg Oral BID WC  . feeding supplement (NEPRO CARB STEADY)  237 mL Oral BID BM  . fluconazole (DIFLUCAN) IV  100 mg Intravenous Q24H  . fluticasone furoate-vilanterol  1 puff Inhalation Daily  . furosemide  160 mg Intravenous Q8H  . heparin  5,000 Units Subcutaneous 3 times per day  . pantoprazole (PROTONIX) IV  40 mg Intravenous Daily  . piperacillin-tazobactam (ZOSYN)  IV  2.25  g Intravenous 3 times per day  . sodium chloride flush  3 mL Intravenous Q12H    have reviewed scheduled and prn medications.  Physical Exam: General: Eyes closed, no complaints Heart: Regular rate and rhythm Lungs: Mostly clear Abdomen: Soft, nontender Extremities: Pitting dependent edema Dialysis Access: Right IJ HD cath placed on 4/7    04/23/2015,9:39 AM  LOS: 10 days

## 2015-04-23 NOTE — Care Management Important Message (Signed)
Important Message  Patient Details  Name: Craig GuerinWilliam [name] MRN: [id number] Date of Birth: 1945-10-08   Medicare Important Message Given:  Yes    Sabriya Yono, Annamarie Majorheryl U, RN 04/23/2015, 11:41 AM

## 2015-04-23 NOTE — Progress Notes (Signed)
Speech Language Pathology Treatment: Dysphagia  Patient Details Name: Craig [name] MRN: [id number] DOB: 11-Feb-1945 Today's Date: 04/23/2015 Time: 0822-0853 SLP Time Calculation (min) (ACUTE ONLY): 31 min  Assessment / Plan / Recommendation Clinical Impression  Pt willing to eat breakfast but consumed only 1/2 of his eggs and approximately 1 ounce water via straw.  No indication of airway compromise with minimal oral residuals of eggs that he clears with liquids.  Pt able to self feed 1/4 of eggs with set up but then appeared fatigued and stated "I'm done".  Encouraged him to self feed as much as able for airway protection.  Recommend advance diet to dys3/thin with general precautions.  SLP to sign off as all education completed and strategies in place.  Advised RN that pt does not like orange magic cup or oatmeal (he said he would try grits and vanilla magic cup) - recommend pt be allowed to help to choose food items to maximize intake.    HPI HPI: 70 y.o. male with h/o COPD, atrial fibrillation, HTN, MI, GERD, hepatitis B, dysphagia, diverticulosis, sepsis, alcoholic cirrhosis of liver without ascites, CAD, thyroid disease, PVD and depression, who presented to ED due to gangrenous toe which needed amputation. Pt acute encephalopathic upon arrival with muffled speech. CXR 4/7 mild bibasilar atelectasis. Per chart, difficulty with pills observed and swallow evaluation ordered.      SLP Plan  All goals met     Recommendations  Diet recommendations: Dysphagia 3 (mechanical soft);Thin liquid Liquids provided via: Cup;Straw Medication Administration: Whole meds with puree Supervision: Patient able to self feed;Intermittent supervision to cue for compensatory strategies (defer to pt for level of assistance due to his weakness) Compensations: Slow rate;Small sips/bites;Follow solids with liquid Postural Changes and/or Swallow Maneuvers: Seated upright 90 degrees;Upright 30-60 min after meal             Oral Care Recommendations: Oral care BID Follow up Recommendations: None Plan: All goals met     [place], [facility] SLP [phone]

## 2015-04-24 ENCOUNTER — Encounter: Payer: Self-pay | Admitting: Adult Health

## 2015-04-24 LAB — RENAL FUNCTION PANEL
ALBUMIN: 1.9 g/dL — AB (ref 3.5–5.0)
ANION GAP: 13 (ref 5–15)
BUN: 71 mg/dL — AB (ref 6–20)
CALCIUM: 8 mg/dL — AB (ref 8.9–10.3)
CO2: 23 mmol/L (ref 22–32)
CREATININE: 7.33 mg/dL — AB (ref 0.61–1.24)
Chloride: 101 mmol/L (ref 101–111)
GFR calc Af Amer: 8 mL/min — ABNORMAL LOW (ref >60)
GFR calc non Af Amer: 7 mL/min — ABNORMAL LOW (ref >60)
GLUCOSE: 86 mg/dL (ref 65–99)
PHOSPHORUS: 6.9 mg/dL — AB (ref 2.5–4.6)
Potassium: 3.4 mmol/L — ABNORMAL LOW (ref 3.5–5.1)
SODIUM: 137 mmol/L (ref 135–145)

## 2015-04-24 LAB — GLUCOSE, CAPILLARY
GLUCOSE-CAPILLARY: 113 mg/dL — AB (ref 65–99)
GLUCOSE-CAPILLARY: 163 mg/dL — AB (ref 65–99)
GLUCOSE-CAPILLARY: 97 mg/dL (ref 65–99)
Glucose-Capillary: 63 mg/dL — ABNORMAL LOW (ref 65–99)
Glucose-Capillary: 65 mg/dL (ref 65–99)
Glucose-Capillary: 68 mg/dL (ref 65–99)
Glucose-Capillary: 70 mg/dL (ref 65–99)
Glucose-Capillary: 75 mg/dL (ref 65–99)

## 2015-04-24 LAB — CBC
HCT: 25.6 % — ABNORMAL LOW (ref 39.0–52.0)
Hemoglobin: 8.7 g/dL — ABNORMAL LOW (ref 13.0–17.0)
MCH: 25.6 pg — AB (ref 26.0–34.0)
MCHC: 34 g/dL (ref 30.0–36.0)
MCV: 75.3 fL — ABNORMAL LOW (ref 78.0–100.0)
Platelets: 161 10*3/uL (ref 150–400)
RBC: 3.4 MIL/uL — ABNORMAL LOW (ref 4.22–5.81)
RDW: 24.5 % — AB (ref 11.5–15.5)
WBC: 15.5 10*3/uL — ABNORMAL HIGH (ref 4.0–10.5)

## 2015-04-24 LAB — VANCOMYCIN, RANDOM: VANCOMYCIN RM: 83 ug/mL — AB

## 2015-04-24 MED ORDER — DEXTROSE 50 % IV SOLN
INTRAVENOUS | Status: AC
  Administered 2015-04-24: 25 mL
  Filled 2015-04-24: qty 50

## 2015-04-24 MED ORDER — AMIODARONE HCL 200 MG PO TABS
200.0000 mg | ORAL_TABLET | Freq: Every day | ORAL | Status: DC
  Administered 2015-04-24 – 2015-05-02 (×6): 200 mg via ORAL
  Filled 2015-04-24 (×8): qty 1

## 2015-04-24 MED ORDER — POLYETHYLENE GLYCOL 3350 17 G PO PACK
17.0000 g | PACK | Freq: Every day | ORAL | Status: DC
  Administered 2015-04-24 – 2015-05-02 (×7): 17 g via ORAL
  Filled 2015-04-24 (×8): qty 1

## 2015-04-24 MED ORDER — ESCITALOPRAM OXALATE 10 MG PO TABS
10.0000 mg | ORAL_TABLET | Freq: Every day | ORAL | Status: DC
  Administered 2015-04-24 – 2015-04-30 (×5): 10 mg via ORAL
  Filled 2015-04-24 (×9): qty 1

## 2015-04-24 MED ORDER — DARBEPOETIN ALFA 150 MCG/0.3ML IJ SOSY
PREFILLED_SYRINGE | INTRAMUSCULAR | Status: AC
  Administered 2015-04-24: 150 ug via SUBCUTANEOUS
  Filled 2015-04-24: qty 0.3

## 2015-04-24 MED ORDER — PANTOPRAZOLE SODIUM 40 MG PO TBEC
40.0000 mg | DELAYED_RELEASE_TABLET | Freq: Every day | ORAL | Status: DC
  Administered 2015-04-25 – 2015-05-02 (×5): 40 mg via ORAL
  Filled 2015-04-24 (×8): qty 1

## 2015-04-24 MED ORDER — LAMOTRIGINE 25 MG PO TABS
50.0000 mg | ORAL_TABLET | Freq: Every day | ORAL | Status: DC
  Administered 2015-04-25 – 2015-05-02 (×5): 50 mg via ORAL
  Filled 2015-04-24 (×10): qty 2

## 2015-04-24 MED ORDER — LEVOTHYROXINE SODIUM 25 MCG PO TABS
125.0000 ug | ORAL_TABLET | Freq: Every day | ORAL | Status: DC
  Administered 2015-04-25 – 2015-05-02 (×6): 125 ug via ORAL
  Filled 2015-04-24 (×9): qty 1

## 2015-04-24 NOTE — Progress Notes (Signed)
Hypoglycemic Event  CBG: 63  Treatment: 15 gm of snack  Symptoms: None  Follow-up CBG: Time:06:16 CBG Result:65  Possible Reasons for Event: Inadequate meal intake  Comments/MD notified: 25 ml of D50 given afterwards, cbg=163 after D50.     Craig [name], Craig [name]

## 2015-04-24 NOTE — Progress Notes (Signed)
Frequent arterial pressure alarms with labile pressures ranging from -80 and jumping at high at -200 at 250 BFR.  Treatment stopped multiple times due to access issues.

## 2015-04-24 NOTE — Progress Notes (Signed)
Pharmacy Antibiotic Note  Craig GuerinWilliam [name] is a 70 y.o. male admitted on 04/13/2015 with osteomyeltitis noted to be on Vancomycin + Zosyn PTA and found to have AKI on admission. Pharmacy has been consulted for Daptomycin (once Vancomycin levels are <15 mcg/ml), Fluconazole, and Zosyn dosing.  The patient's Vancomycin random this morning remains SUPRAtherapeutic though trending down (VR 83 << 114, goal of 15-20 mcg/ml). The patient is producing UOP however does not appear to be filtering since SCr rising between HD sessions. The patient is s/p HD on 4/7 (2 hr BFR 200) and 4/8 (3 hr BFR 250) with plans for HD again today (4/11). Will plan to start Daptomycin when the VR has trended down to <15 mcg/ml.  The patient also continues on Fluconazole + Zosyn and these doses remain appropriate at this time for the patient's renal function.   Plan: 1. Hold start of Daptomycin due to elevated Vancomycin levels 2. Continue Fluconazole 100 mg/24h 3. Continue Zosyn 2.25g/8h 4. Will continue to follow renal function, HD plans, and repeat VR as needd  Height: 6\' 3"  (190.5 cm) Weight: 191 lb 9.3 oz (86.9 kg) IBW/kg (Calculated) : 84.5  Temp (24hrs), Avg:98.1 F (36.7 C), Min:97.6 F (36.4 C), Max:98.6 F (37 C)   Recent Labs Lab 04/20/15 0445 04/21/15 0532 04/22/15 0528 04/23/15 0525 04/24/15 0455  WBC 17.7* 11.7* 14.2* 14.5* 15.5*  CREATININE 12.17* 9.24* 5.66* 6.60* 7.33*  VANCORANDOM  --  114*  --   --  83*    Estimated Creatinine Clearance: 11.5 mL/min (by C-G formula based on Cr of 7.33).    No Known Allergies  Antimicrobials this admission: Vanc PTA >> 4/1 [intended to stop 4/28] * 4/1 VR: 147 mcg/ml * 4/3 VR: 146 mcg/ml * 4/5 VR: 115 mcg/ml * 4/8 VR: 114 mcg/ml * 4/11 VR: 83 mcg/ml Zosyn PTA >> [intended to stop 4/28] Dapto (not yet started) Fluconazole 4/4 >>   Dose adjustments this admission: * 4/1 VR: 147 mcg/ml * 4/3 VR: 146 mcg/ml * 4/5 VR: 115 mcg/ml * 4/8 VR: 114  mcg/ml * 4/11 VR: 83 mcg/ml  Microbiology results: 4/1 MRSA PCR >> positive 4/1 UCx >> 100k yeast + 40k PSA 4/1 BCx >> NG  Thank you for allowing pharmacy to be a part of this patient's care.  Georgina PillionElizabeth Novalee Horsfall, PharmD, BCPS Clinical Pharmacist Pager: [phone] 04/24/2015 1:57 PM

## 2015-04-24 NOTE — Progress Notes (Signed)
Hypoglycemic Event  CBG: 68  Treatment: 15 GM carbohydrate snack  Symptoms: None  Follow-up CBG: Time:01:14 CBG Result:113  Possible Reasons for Event: Inadequate meal intake  Comments/MD notified:Patient encouraged to eat. Will continue to monitor.    Craig FabryBirago, Donnielle Addison

## 2015-04-24 NOTE — Progress Notes (Signed)
[facility] PROGRESS NOTE  Craig [name] ZOX:[id number] DOB: 27-Mar-1945 DOA: 04/13/2015 PCP: Kirt Boys, DO  Brief Summary  Craig [name] is a 70 year old gentleman with past medical history of peripheral vascular disease, chronic right toe ulcer, nonambulatory at baseline having poor functional status, severe protein malnutrition, who was seen by Dr Imogene Burn of vascular surgery on 03/30/2015. He was found to have frank pus draining from right second toe as it was recommended to him then that he be admitted to the hospital and evaluated by orthopedic surgery for amputation.  He refused to go to the emergency department. He has been treated with IV vancomycin and Zosyn in the outpatient setting.  He was admitted to the medicine service on 04/14/2015 presenting as a transfer from his skilled nursing facility for further evaluation of acute encephalopathy. On admission he had sepsis criteria with his source of infection likely to be from osteomyelitis. X-rays of his right foot showed lytic destruction of the second proximal phalanx consistent with acute osteomyelitis. Labs also revealed acute kidney injury with creatinine of 8.6, with previous lab work showing creatinine of 0.9 on 03/14/2015. Other issues include respiratory acidosis. An ABG revealed a pH of 7.198 with PCO2 of 63 and PaO2 of 73. Chest x-ray did not show acute infiltrate, VQ scan showed low probability for pulmonary embolism.  With regard to acute osteomyelitis, Dr Lajoyce [name] evaluated Craig [name] and for now recommended treatment with IV antibiotic therapy holding off on surgical intervention. He also stated that he if surgery needed to be done would likely require a above-the-knee amputation. Vancomycin had been discontinued on admission due to supratherapeutic levels. Nephrology recommended to daptomycin. Zosyn was continued.   Regards to acute kidney injury nephrology was consulted. His creatinine has trended up from 8.44 on admission  to 10.7 on 04/17/2015. Nephrology felt that this could be related to vancomycin-induced nephrotoxicity having vancomycin levels of 146. Bilateral renal ultrasound negative for hydronephrosis. Critical illness/sepsis, hypovolemia may also have contributed. Labs have showed stable potassium with bicarbonate of 25.   Assessment/Plan  Sepsis, sepsis physiology resolved, although leukocytosis persists.  Sepsis may have been due to osteomyelitis.  WBC fluctuating -  Continue IV Zosyn -  vancomycin level trending down, 147 at admission, now 83 -  consider starting daptomycin once vanc level subtherapeutic -  Remains afebrile, blood pressure stable. Blood cultures drawn 04/14/2015 showing no growth to date.  Acute hypoxemic hypercarbic respiratory failure, resolved.   - Chest x-ray performed in the emergency department did not reveal acute infiltrate. 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   -Nephrology assistance appreciated - Started HD 4/7.  Temp HD Catheter placed by Dr. Briant Cedar same date - creatinine rising between HD sessions  Acute encephalopathy, due to uremia, resolving with HD -  Appreciate SLP assistance -  Aspiration precautions -  Resume oral medications  Intermittent hypoglycemia -  Diet advanced -  Check frequent CBG and initiate hypoglycemia protocol prn -  May need dextrose fluids resumed  Elevated blood pressures, trending down slightly  -  Continue hydralazine prn -  Continue carvedilol and norvasc  Acute osteomyelitis with underlying PVD -Craig. [name] now amenable to amputation if necessary -X-ray performed in the emergency department showing lytic destruction of the second proximal phalanx consistent with acute osteomyelitis. - Dr.Duda of orthopedic surgery recommended continuing medical  management for now  - Should his condition  worsen, he would need an above-the-knee amputation  Candidal urinary tract infection. - Urine culture growing Pseudomonas and yeast - currently on Zosyn through at least 4/7 - Continue IV Diflucan through 4/14  Microcytic anemia, likely iron defiency, but may also be due to zosyn -  Monitor for evidence of GIB - He was transfused with 2 units of PRBC's on 04/15/2015 and again on 4/8 - Feraheme given 4/1  Chronic obstructive pulmonary disease, mild wheezing - albuterol prn  Severe protein calorie malnutrition -Patient having a a pre-albumin level of 7.0. At baseline has a poor functional status - start supplements  Medical goals of care, Appreciate palliative care assistance, consult completed on 4/2  Leukocytosis, on broad spectrum abx.  May be related to his leg or early sign of C. Diff -  Monitor for diarrhea - CXR unremarkable - repeat UA not obtained  Stage 1 pressure ulcer on heel and sacrum.  -  allevyn dressings  Hypokalemia due to dialysis -  Oral potassium repletion today per nephrology  Diet:  Dysphagia 2 Access:  PIV IVF:  off Proph:  heparin  Code Status: DNR Family Communication: patient alone Disposition Plan:  Anticipate prolonged hospitalization as kidneys gradually recover from vancomycin toxicity  Consultants:  Nephrology  Orthopedic surgery  Palliative care  Procedures:  Started HD 4/7  Antibiotics:  Vancomycin ongoing due to elevated levels  Zosyn   Fluconazole 4/4  HPI/Subjective:  Denies pain today.  Again denies shortness of breath, nausea, vomiting  Objective: Filed Vitals:   04/24/15 0953 04/24/15 0959 04/24/15 1644 04/24/15 1650  BP: 155/53  162/67 164/66  Pulse: 68  73 71  Temp: 97.7 F (36.5 C)     TempSrc: Oral     Resp: 16  19 20   Height:      Weight:   89.8 kg (197 lb 15.6 oz)   SpO2: 98% 98%      Intake/Output Summary (Last 24 hours) at 04/24/15 1719 Last data  filed at 04/24/15 1300  Gross per 24 hour  Intake    500 ml  Output    850 ml  Net   -350 ml   Filed Weights   04/21/15 1430 04/21/15 2112 04/24/15 1644  Weight: 86.8 kg (191 lb 5.8 oz) 86.9 kg (191 lb 9.3 oz) 89.8 kg (197 lb 15.6 oz)   Body mass index is 24.74 kg/(m^2).  Exam:   General:  Adult male, No acute distress, awake and answering questions   HEENT:  NCAT, MMM  Cardiovascular:  RRR, nl S1, S2 no mrg, 2+ pulses, warm extremities  Respiratory:  CTAB, no increased WOB  Abdomen:   NABS, soft, NT/ND  MSK:   Severe muscle wasting of bilateral lower extremities with smooth skin and 1+ slow pitting edema.  Diminished pulses bilaterally.  Right foot with dried second toe.  Mild redness to right heel  Neuro:  Grossly moves all extremities and able to move legs a little bit to assist with exam.   Data Reviewed: Basic Metabolic Panel:  Recent Labs Lab 04/20/15 0445 04/21/15 0532 04/22/15 0528 04/23/15 0525 04/24/15 0455  NA 136 139 137 139 137  K 4.6 4.0 2.9* 2.9* 3.4*  CL 100* 102 99* 100* 101  CO2 20* 23 25 25 23   GLUCOSE 204* 91 73 80 86  BUN 147* 106* 53* 60* 71*  CREATININE 12.17* 9.24* 5.66* 6.60* 7.33*  CALCIUM 8.3* 7.7* 8.0* 8.1* 8.0*  PHOS 12.0* 8.3* 5.4* 6.4* 6.9*   Liver  Function Tests:  Recent Labs Lab 04/20/15 0445 04/21/15 0532 04/22/15 0528 04/23/15 0525 04/24/15 0455  ALBUMIN 2.2* 1.9* 2.0* 2.0* 1.9*   No results for input(s): LIPASE, AMYLASE in the last 168 hours. No results for input(s): AMMONIA in the last 168 hours. CBC:  Recent Labs Lab 04/20/15 0445 04/21/15 0532 04/22/15 0528 04/23/15 0525 04/24/15 0455  WBC 17.7* 11.7* 14.2* 14.5* 15.5*  HGB 8.1* 6.6* 9.4* 8.8* 8.7*  HCT 24.6* 19.7* 28.5* 26.9* 25.6*  MCV 71.9* 70.6* 74.0* 73.9* 75.3*  PLT 272 209 181 181 161    No results found for this or any previous visit (from the past 240 hour(s)).   Studies: No results found.  Scheduled Meds: . amiodarone  200 mg Oral  Daily  . amLODipine  5 mg Oral Daily  . carvedilol  6.25 mg Oral BID WC  . darbepoetin (ARANESP) injection - DIALYSIS  150 mcg Subcutaneous Q Tue-1800  . escitalopram  10 mg Oral QHS  . feeding supplement  1 Container Oral TID BM  . feeding supplement (PRO-STAT SUGAR FREE 64)  30 mL Oral TID  . fluconazole (DIFLUCAN) IV  100 mg Intravenous Q24H  . fluticasone furoate-vilanterol  1 puff Inhalation Daily  . heparin  5,000 Units Subcutaneous 3 times per day  . lamoTRIgine  50 mg Oral Daily  . levothyroxine  125 mcg Oral QAC breakfast  . pantoprazole  40 mg Oral Daily  . piperacillin-tazobactam (ZOSYN)  IV  2.25 g Intravenous 3 times per day  . polyethylene glycol  17 g Oral Daily  . sodium chloride flush  3 mL Intravenous Q12H   Continuous Infusions:    Principal Problem:   Sepsis (HCC) Active Problems:   UTI (urinary tract infection)   Protein-calorie malnutrition, severe (HCC)   AKI (acute kidney injury) (HCC)   Failure to thrive in adult   Abscess of second toe of right foot   Osteomyelitis (HCC)    Time spent: 30 min    Craig [name]  [facility] Pager [phone]. If 7PM-7AM, please contact night-coverage at [email or web address], password [facility] 04/24/2015, 5:19 PM  LOS: 11 days

## 2015-04-24 NOTE — Progress Notes (Signed)
Dialysis treatment completed.  2500 mL ultrafiltrated.  2000 mL net fluid removal.  Patient status unchanged. Lung sounds diminished to ausculation in all fields. Generalized edema. Cardiac: NSR.  Cleansed RIJ catheter with chlorhexidine.  Disconnected lines and flushed ports with saline per protocol.  Ports locked with heparin and capped per protocol.    Report given to bedside, RN [facility].   Tx time extended by 17 minutes due to frequent arterial pressure alarms.  RIJ found per this RN to be extremely positional.

## 2015-04-24 NOTE — Progress Notes (Signed)
Subjective:  Reasonable urine output at 1 L last 24 hours- creatinine however is rising Objective Vital signs in last 24 hours: Filed Vitals:   04/23/15 2100 04/24/15 0500 04/24/15 0953 04/24/15 0959  BP: 148/56 154/57 155/53   Pulse: 65 72 68   Temp: 98.6 F (37 C) 98.4 F (36.9 C) 97.7 F (36.5 C)   TempSrc: Oral Oral Oral   Resp: 16 16 16    Height:      Weight:      SpO2: 98% 96% 98% 98%   Weight change:   Intake/Output Summary (Last 24 hours) at 04/24/15 1015 Last data filed at 04/24/15 [id number]  Gross per 24 hour  Intake    560 ml  Output   1175 ml  Net   -615 ml    Assessment/ Plan: Pt is a 70 y.o. yo male who was admitted on 04/13/2015 with foot ulcer [name]/osteo- developed AKI insetting of supratherapeutic vancomycin levels Assessment/Plan: 1. Renal- baseline renal function normal. Developed AKI the setting of foot ulcer/ostial and supratherapeutic vancomycin levels. He is now nonoliguric. He is not a candidate for long-term dialysis support, however, during the course of this hospitalization doing dialysis PRN. Had HD on 4/7 and 4/8. Will do another HD treatment today 2. Foot ulcer/osteo- medical management for now. However, in conversations with palliative care patient has consented to amputation if absolutely necessary 3. Anemia- status post Feraheme as well as transfusions- most recently on 4/8. Will add ESA 4. HTN/volume- is nonoliguric and hypertensive. On Norvasc, Coreg  5. Hypokalemia-  repleted yest- will run on 4 K bath today   Agripina Guyette A    Labs: Basic Metabolic Panel:  Recent Labs Lab 04/22/15 0528 04/23/15 0525 04/24/15 0455  NA 137 139 137  K 2.9* 2.9* 3.4*  CL 99* 100* 101  CO2 25 25 23   GLUCOSE 73 80 86  BUN 53* 60* 71*  CREATININE 5.66* 6.60* 7.33*  CALCIUM 8.0* 8.1* 8.0*  PHOS 5.4* 6.4* 6.9*   Liver Function Tests:  Recent Labs Lab 04/22/15 0528 04/23/15 0525 04/24/15 0455  ALBUMIN 2.0* 2.0* 1.9*   No results for input(s):  LIPASE, AMYLASE in the last 168 hours. No results for input(s): AMMONIA in the last 168 hours. CBC:  Recent Labs Lab 04/20/15 0445 04/21/15 0532 04/22/15 0528 04/23/15 0525 04/24/15 0455  WBC 17.7* 11.7* 14.2* 14.5* 15.5*  HGB 8.1* 6.6* 9.4* 8.8* 8.7*  HCT 24.6* 19.7* 28.5* 26.9* 25.6*  MCV 71.9* 70.6* 74.0* 73.9* 75.3*  PLT 272 209 181 181 161   Cardiac Enzymes: No results for input(s): CKTOTAL, CKMB, CKMBINDEX, TROPONINI in the last 168 hours. CBG:  Recent Labs Lab 04/24/15 0114 04/24/15 0555 04/24/15 0616 04/24/15 0636 04/24/15 0745  GLUCAP 113* 63* 65 163* 97    Iron Studies: No results for input(s): IRON, TIBC, TRANSFERRIN, FERRITIN in the last 72 hours. Studies/Results: No results found. Medications: Infusions:    Scheduled Medications: . amiodarone  200 mg Oral Daily  . amLODipine  5 mg Oral Daily  . carvedilol  6.25 mg Oral BID WC  . darbepoetin (ARANESP) injection - DIALYSIS  150 mcg Subcutaneous Q Tue-1800  . escitalopram  10 mg Oral QHS  . feeding supplement  1 Container Oral TID BM  . feeding supplement (PRO-STAT SUGAR FREE 64)  30 mL Oral TID  . fluconazole (DIFLUCAN) IV  100 mg Intravenous Q24H  . fluticasone furoate-vilanterol  1 puff Inhalation Daily  . heparin  5,000 Units Subcutaneous 3 times per  day  . lamoTRIgine  50 mg Oral Daily  . levothyroxine  125 mcg Oral QAC breakfast  . pantoprazole  40 mg Oral Daily  . piperacillin-tazobactam (ZOSYN)  IV  2.25 g Intravenous 3 times per day  . polyethylene glycol  17 g Oral Daily  . sodium chloride flush  3 mL Intravenous Q12H    have reviewed scheduled and prn medications.  Physical Exam: General: Eyes closed, no complaints Heart: Regular rate and rhythm Lungs: Mostly clear Abdomen: Soft, nontender Extremities: Pitting dependent edema Dialysis Access: Right IJ HD cath placed on 4/7    04/24/2015,10:15 AM  LOS: 11 days

## 2015-04-24 NOTE — Progress Notes (Signed)
Patient ID: Craig [name], male   DOB: [birth year], 70 y.o.   MRN: [id number]   Facility: [facility]        No Known Allergies  Chief Complaint  Patient presents with  . Acute Visit    Status    HPI:    Past Medical History  Diagnosis Date  . COPD (chronic obstructive pulmonary disease) ([place])   . Hypertension   . Myocardial infarction ([place])   . A-fib ([place])   . GERD (gastroesophageal reflux disease)   . Hepatitis B 04/27/2013  . Dysphagia 04/27/2013  . Diverticulosis 03/05/2013  . Sepsis ([place]) 04/26/2013  . Alcoholic cirrhosis of liver without ascites ([place])   . Coronary artery disease   . Thyroid disease   . Depression   . Urinary retention     Past Surgical History  Procedure Laterality Date  . Colostomy    . Peg tube placement    . Peg tube removal      VITAL SIGNS BP 130/76 mmHg  Pulse 78  Ht 5' 9"  (1.753 m)  Wt 158 lb (71.668 kg)  BMI 23.32 kg/m2  SpO2 94%  Patient's Medications  New Prescriptions   No medications on file  Previous Medications   AMINO ACIDS-PROTEIN HYDROLYS (FEEDING SUPPLEMENT, PRO-STAT SUGAR FREE 64,) LIQD    Take 60 mLs by mouth 2 (two) times daily. 9am, 6pm   AMIODARONE (PACERONE) 200 MG TABLET    Take 200 mg by mouth daily.   ASPIRIN EC 81 MG TABLET    Take 81 mg by mouth daily.   CETIRIZINE (ZYRTEC) 10 MG TABLET    Take 10 mg by mouth daily as needed for allergies.    ESCITALOPRAM (LEXAPRO) 10 MG TABLET    Take 10 mg by mouth daily.   FLUTICASONE FUROATE-VILANTEROL (BREO ELLIPTA) 100-25 MCG/INH AEPB    Inhale 1 puff into the lungs daily.    FOLIC ACID (FOLVITE) 1 MG TABLET    Take 1 mg by mouth daily.   HYDROXYZINE (ATARAX/VISTARIL) 25 MG TABLET    Take 25 mg by mouth every 6 (six) hours as needed for itching.   IRON POLYSACCH CMPLX-B12-FA 150-0.025-1 MG CAPS    Take 1 capsule by mouth daily.   K PHOS MONO-SOD PHOS DI & MONO (PHOSPHA 250 NEUTRAL PO)    Take 250 mg by mouth 2 (two) times daily. 9am, 6pm   LAMOTRIGINE (LAMICTAL)  25 MG TABLET    Take 50 mg by mouth daily.    LEVOTHYROXINE (SYNTHROID, LEVOTHROID) 125 MCG TABLET    Take 125 mcg by mouth daily before breakfast.   MELATONIN 3 MG TABS    Take 3 mg by mouth at bedtime.    MORPHINE (ROXANOL) 20 MG/ML CONCENTRATED SOLUTION    Take 4 mg by mouth every 2 (two) hours as needed for severe pain.   MULTIPLE VITAMINS-MINERALS (DECUBI-VITE PO)    Take 2 capsules by mouth daily.    NUTRITIONAL SUPPLEMENTS (NUTRITIONAL SUPPLEMENT PO)    Take by mouth 2 (two) times daily with a meal. House Supplement   NUTRITIONAL SUPPLEMENTS (NUTRITIONAL SUPPLEMENT PO)    Take 90 mLs by mouth 3 (three) times daily. House 2.0 supplement   OMEPRAZOLE (PRILOSEC) 20 MG CAPSULE    Take 20 mg by mouth daily before breakfast.    ONDANSETRON (ZOFRAN) 4 MG TABLET    Take 4 mg by mouth every 6 (six) hours as needed for nausea or vomiting.   OXYGEN    Inhale  into the lungs continuous. 2 L/min  Maintain Sp02 >92%   PIPERACILLIN-TAZOBACTAM (ZOSYN) 3.375 GM/50ML IVPB    Inject 3.375 g into the vein every 6 (six) hours. 6 week course started 03/30/15 (end date 05/11/15)   POLYETHYLENE GLYCOL (MIRALAX / GLYCOLAX) PACKET    Take 17 g by mouth daily. Mix in 8 oz liquid and drink   POVIDONE-IODINE (BETADINE EX)    Apply 1 application topically daily. Reported on 04/24/2015   PROMETHAZINE (PHENERGAN) 25 MG TABLET    Take 25 mg by mouth every 6 (six) hours as needed for nausea or vomiting.   THIAMINE 100 MG TABLET    Take 100 mg by mouth daily.   VANCOMYCIN IN SODIUM CHLORIDE 0.9 % 250 ML    Inject 1,250 mg into the vein every 12 (twelve) hours. Start dated 04/03/15   VITAMIN C (ASCORBIC ACID) 500 MG TABLET    Take 500 mg by mouth 2 (two) times daily. 9am, 6pm  Modified Medications   No medications on file  Discontinued Medications   No medications on file     SIGNIFICANT DIAGNOSTIC EXAMS  12-16-13: chest x-ray; right lower lobe atelectasis  08-26-14: s/p foley placement: Placement of 12 French suprapubic  bladder drainage catheter. Urine return was cloudy and a sample of urine was sent for urinalysis and culture.  10-20-14: kub: non-specific gas pattern.   10-21-14: ct of abdomen and pelvis: 1. Focal collection of fluid and trace air at the anterior left mid abdominal wall, measuring 8.1 x 2.1 x 5.3 cm, with peripheral enhancement, compatible with an abscess. This appears to be at the prior site of a G-tube placement, as the stomach is tamped up to the abdominal wall underlying the abscess. This may still be contiguous with the stomach. 2. Prominent bronchiectasis at the lower lung lobes, with patchy bibasilar airspace opacities. This may reflect an atypical infectious process. Aspiration cannot be excluded. Trace right pleural effusion seen. 3. Left lower quadrant colostomy site is unremarkable in appearance. Hartmann's pouch is within normal limits. 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 Mild diverticulosis along the distal transverse, descending and proximal sigmoid colon, without evidence of diverticulitis. 4. Small bilateral renal cysts seen. 5. Diffuse calcification along the abdominal aorta and its branches. 6. 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  02-26-15: kub: mild ileus  03-01-15: KUB: at least a partial small bowel obstruction. Is worse.   03-01-15: ct of abdomen and pelvis: 1. Positive for small bowel obstruction with a focal transition points in the right lower quadrant likely secondary to underlying adhesions. No evidence of significant inflammation, bowel ischemic change or perforation. 2. Surgical changes of prior sigmoidectomy with Hartmann's pouch and left lower quadrant diverting colostomy. 3. Parastomal hernia containing omental fat and a short segment of small bowel. No evidence of mechanical obstruction at this location. 4.  Bilateral lower lobe bronchiectasis with areas of partial bronchial impaction, atelectasis and pleural parenchymal scarring. 5. Diverticulosis of the residual left colon without evidence of active diverticulitis. 6. 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  03-05-15: kub: 1. Interim removal of NG tube. 2. Persistent dilated loops of small bowel are noted. Colonic gas pattern is normal. No free air    LABS REVIEWED:    04-26-14:  glucose 81; bun 17; creat 1.33; k+4.7; na++139; phos 4.5; psa 1.24 08-14-14: wbc 8.8; hgb 8.2; hct 28.5; mcv 71.8; plt 250; glucose 72; bun 13; creat 1.13; k+ 4.7; na++140; liver normal albumin 3.2; phos 3.3; mag 1.6; tsh 0.298  08-26-14: wbc 11.0; hgb 7.7; hct 27.1; mcv 74.5. ;plt 297; glucose 124; bun 19; creat 1.21; k+3.6; na++141; liver normal albumin 2.9; urine culture: gram neg rods.  10-02-14: tsh 0.236  10-21-14 :wbc 15.7; hgb 7.6; hct 25.4; mcv 67.9; plt 402; glucose 87; bun 19; creat 1.14; k+ 3.8; na++135; liver normal albumin 2.7; blood culture X2: no growth; HIV; nr 10-22-14: wbc 8.2; hgb 7.0; hct 23.1; mcv 67.7; plt 365; glucose 83; bun 11; creat 1.06; k+ 3.6; n++139; tsh 0.088 10-23-14: urine culture: klebsiella pneumonia proteus mirabilis: septra 10-25-14; wbc 9.8; hgb 9.8; hct 31.3; mcv 72.8; plt 396; glucose 72; bun 9; creat 0.85; k+ 3.7; na++139; tsh 0.267 12-08-14: tsh 0.195; free T4: 1.62 (normal)  02-26-15: wbc 13,4 hgb 10,6l hct 34.5 mcv 73.2; plt 336; glucose 66; bun 36; creat 1.34; k+ 5.1; na++ 134; liver normal albumin 3.4; amylase 61; lipase 24; U/A +  03-01-15: wbc 16.0; hgb 10.4; hct 33.8; mcv 71.5; ;plt 335; glucose 99; bun 35; creat 1.41; k+ 3.5; na++136; ast 13; alt 11; alk phos 78; albumin 2.8 03-02-15: blood culture: no growth; urine culture: minimal: providencia stuartii; pseudomonas aeruginosa; proteus  mirabilis 02-21-15: wbc 12.5; hgb 8.6; hct 29.5; mcv 73.9; plt 276; glucose 98; bun 7; creat 0.92; creat 4.5;na++138; vit B12: 1457; folate 47.4; iron 7; tibc 235; mag 1.9 pre-albumin 68  03-06-15: glucose 92; bun 7; creat 1.04; k+ 4.8; na++139  03-14-15: wbc 8.6; hgb 8.5; hct 28.1; mcv 72.6; plt 297; glucose 70; bun 12; creat 0.91; k+ 4.5; na++140 pre-albumin 15      Review of Systems Constitutional: Negative for appetite change and fatigue.  HENT: Negative for congestion.   Respiratory: Negative for cough, chest tightness and shortness of breath.   Cardiovascular: Negative for chest pain, palpitations and leg swelling.  Gastrointestinal:has nausea and vomiting  Musculoskeletal: Negative for myalgias and arthralgias.  Skin: Negative for pallor. Psychiatric/Behavioral: The patient is not nervous/anxious.      Physical Exam Constitutional: frail. No distress.  Eyes: Conjunctivae are normal.  Neck: Neck supple. No JVD present. No thyromegaly present.  Cardiovascular: Normal rate, regular rhythm and post tibial and pedal pulses non-palpable    Respiratory: Effort normal and breath sounds normal. No respiratory distress. He has no wheezes.  GI: abdomen soft nontender; no distension; bowel sounds present in all four quads  Has left lower quad colostomy   Has s/p foley  Musculoskeletal: He exhibits no edema.  Able to move upper extremities Does not move lower extremities    Lymphadenopathy:    He has no cervical adenopathy.  Neurological: He is alert.  Skin: Skin is warm and dry. He is not diaphoretic.  Right second toe: necrotic with scant amount of purulent drainage present Psychiatric: He has a normal mood and affect        Ok Edwards NP [facility]  Contact [phone] Monday through Friday 8am- 5pm  After hours call [phone]

## 2015-04-25 LAB — RENAL FUNCTION PANEL
ALBUMIN: 1.9 g/dL — AB (ref 3.5–5.0)
ANION GAP: 11 (ref 5–15)
BUN: 27 mg/dL — ABNORMAL HIGH (ref 6–20)
CALCIUM: 8 mg/dL — AB (ref 8.9–10.3)
CO2: 27 mmol/L (ref 22–32)
CREATININE: 4.02 mg/dL — AB (ref 0.61–1.24)
Chloride: 99 mmol/L — ABNORMAL LOW (ref 101–111)
GFR calc Af Amer: 16 mL/min — ABNORMAL LOW (ref >60)
GFR, EST NON AFRICAN AMERICAN: 14 mL/min — AB (ref >60)
Glucose, Bld: 85 mg/dL (ref 65–99)
PHOSPHORUS: 3.9 mg/dL (ref 2.5–4.6)
Potassium: 3.3 mmol/L — ABNORMAL LOW (ref 3.5–5.1)
SODIUM: 137 mmol/L (ref 135–145)

## 2015-04-25 LAB — GLUCOSE, CAPILLARY
GLUCOSE-CAPILLARY: 76 mg/dL (ref 65–99)
Glucose-Capillary: 142 mg/dL — ABNORMAL HIGH (ref 65–99)
Glucose-Capillary: 73 mg/dL (ref 65–99)
Glucose-Capillary: 79 mg/dL (ref 65–99)
Glucose-Capillary: 81 mg/dL (ref 65–99)
Glucose-Capillary: 82 mg/dL (ref 65–99)

## 2015-04-25 LAB — CBC
HCT: 26.1 % — ABNORMAL LOW (ref 39.0–52.0)
HEMOGLOBIN: 8.7 g/dL — AB (ref 13.0–17.0)
MCH: 25.4 pg — ABNORMAL LOW (ref 26.0–34.0)
MCHC: 33.3 g/dL (ref 30.0–36.0)
MCV: 76.1 fL — ABNORMAL LOW (ref 78.0–100.0)
PLATELETS: 171 10*3/uL (ref 150–400)
RBC: 3.43 MIL/uL — AB (ref 4.22–5.81)
RDW: 25 % — ABNORMAL HIGH (ref 11.5–15.5)
WBC: 18.4 10*3/uL — AB (ref 4.0–10.5)

## 2015-04-25 MED ORDER — SACCHAROMYCES BOULARDII 250 MG PO CAPS
250.0000 mg | ORAL_CAPSULE | Freq: Two times a day (BID) | ORAL | Status: DC
  Administered 2015-04-25 – 2015-05-02 (×10): 250 mg via ORAL
  Filled 2015-04-25 (×16): qty 1

## 2015-04-25 MED ORDER — POTASSIUM CHLORIDE CRYS ER 20 MEQ PO TBCR
40.0000 meq | EXTENDED_RELEASE_TABLET | Freq: Once | ORAL | Status: AC
  Administered 2015-04-25: 40 meq via ORAL
  Filled 2015-04-25: qty 2

## 2015-04-25 MED ORDER — SODIUM CHLORIDE 0.9% FLUSH
10.0000 mL | INTRAVENOUS | Status: DC | PRN
  Administered 2015-04-26 – 2015-04-28 (×4): 10 mL
  Administered 2015-04-28 – 2015-04-29 (×2): 20 mL
  Administered 2015-05-03: 10 mL
  Administered 2015-05-03: 20 mL
  Filled 2015-04-25 (×8): qty 40

## 2015-04-25 NOTE — Progress Notes (Signed)
[facility] PROGRESS NOTE  Craig GuerinWilliam [name] ZOX:[id number] DOB: August 11, 1945 DOA: 04/13/2015 PCP: Kirt Boysarter, Monica, DO  Brief Summary  [name] is a 70 year old gentleman with past medical history of peripheral vascular disease, chronic right toe ulcer, nonambulatory at baseline having poor functional status, severe protein malnutrition, who was seen by Dr Imogene Burnhen of vascular surgery on 03/30/2015. He was found to have frank pus draining from right second toe as it was recommended to him then that he be admitted to the hospital and evaluated by orthopedic surgery for amputation.  He refused to go to the emergency department and was treated with IV vancomycin and Zosyn in the outpatient setting.   He was admitted to the medicine service on 04/14/2015 presenting as a transfer from his skilled nursing facility for further evaluation of acute encephalopathy likely secondary to renal failure from vancomycin toxicity.  His vancomycin level at the time of admission was 146.   Although there were concerns on admission for sepsis, I doubt this was the case since he was supratherapeutically treated with vancomycin and had been on zosyn prior to admission. X-rays of his right foot showed lytic destruction of the second proximal phalanx consistent with osteomyelitis which he was being treated for as an outpatient.  His creatinine on admission was 8.6, with a baseline creatinine 0.9 a month prior to admission.  His creatinine has trended up from 8.44 on admission to 12.17 on 04/20/2015 and he became profoundly uremic with severe lethargy bordering on obtundation.  He underwent HD catheter placement at bedside and started intermittent HD.  It is expected that his kidney function will recover as his vancomycin levels trend down.    Other issues included respiratory acidosis. ABG revealed a pH of 7.198 with PCO2 of 63 and PaO2 of 73. Chest x-ray did not show acute infiltrate, VQ scan showed low probability for pulmonary  embolism.  With regard to his osteomyelitis/dry gangrene of the right foot, Dr Lajoyce Cornersuda recommended treatment with IV antibiotic therapy, holding off on surgical intervention.  If surgery were done, it would likely require an above-the-knee amputation.   Assessment/Plan  Sepsis, sepsis physiology resolved, although leukocytosis persists.  Sepsis may have been due to osteomyelitis.  WBC fluctuating. -  Monitor for fungal infections and C. Diff colitis -  Continue IV Zosyn -  vancomycin level trending down, 147 at admission, now 83 -  consider starting daptomycin once vanc level subtherapeutic -  Remains afebrile, blood pressure stable. Blood cultures drawn 04/14/2015 showing no growth to date. -  Duration of antibiotics per orthopedic surgery  Oliguric renal failure secondary to vancomycin induced nephrotoxicity, hypovolemia, and critical illness.   -Nephrology assistance appreciated - Started HD 4/7.  Temp HD Catheter placed by Dr. Briant CedarMattingly same date - Still making urine   Acute hypoxemic hypercarbic respiratory failure, resolved.   - Chest x-ray performed in the emergency department did not reveal acute infiltrate. -ABG revealing respiratory acidosis -V/Q scan performed on 04/15/2015 was low probability for pulmonary embolism  Acute encephalopathy, due to uremia, resolving with HD -  Appreciate SLP assistance -  Aspiration precautions -  Resume oral medications  Intermittent hypoglycemia -  Diet advanced -  Check frequent CBG and initiate hypoglycemia protocol prn -  May need dextrose fluids resumed  Elevated blood pressures, trending down slightly  -  Continue hydralazine prn -  Continue carvedilol and norvasc  Acute osteomyelitis with underlying PVD -Mr. Craig [name] now amenable to amputation if necessary -X-ray performed in the emergency department showing  lytic destruction of the second proximal phalanx consistent with acute osteomyelitis. - Dr.Duda of orthopedic surgery  recommended continuing medical management for now  - Should his condition worsen, he would need an above-the-knee amputation  Candidal urinary tract infection. - Urine culture growing Pseudomonas and yeast - currently on Zosyn through at least 4/7 - Continue IV Diflucan through 4/14  Microcytic anemia, likely iron defiency, but may also be due to zosyn -  Monitor for evidence of GIB - He was transfused with 2 units of PRBC's on 04/15/2015 and again on 4/8 - Feraheme given 4/1  Chronic obstructive pulmonary disease, mild wheezing - albuterol prn  Severe protein calorie malnutrition -Patient having a a pre-albumin level of 7.0. At baseline has a poor functional status - started supplements  Medical goals of care, Appreciate palliative care assistance, consult completed on 4/2  Leukocytosis, on broad spectrum abx.  May be related to his leg or early sign of C. Diff -  Monitor for diarrhea - CXR unremarkable - repeat UA not obtained  Stage 1 pressure ulcer on heel and sacrum.  -  allevyn dressings  Hypokalemia due to dialysis -  Potassium repletion today per nephrology  Diet:  Dysphagia 2 Access:  PIV IVF:  off Proph:  heparin  Code Status: DNR Family Communication: patient alone.  Patient is able to make some medical decisions for himself.  His POA is Craig [name].  Family defers decisions to [name] and patient has requested [name] as his POA. Disposition Plan:  Anticipate prolonged hospitalization as kidneys gradually recover from vancomycin toxicity.  Back to SNF  Consultants:  Nephrology  Orthopedic surgery  Palliative care  Procedures:  Started HD 4/7  Antibiotics:  Vancomycin ongoing due to elevated levels  Zosyn   Fluconazole 4/4  HPI/Subjective:  Denies pain today.  Again denies shortness of breath, nausea, vomiting but states he has poor appetite.  Objective: Filed Vitals:   04/24/15 2105 04/25/15 0418 04/25/15 1000 04/25/15 1010   BP: 170/74 132/61  139/42  Pulse: 76 75  70  Temp:  98.6 F (37 C)  98 F (36.7 C)  TempSrc:  Axillary  Oral  Resp:  16  18  Height:      Weight:      SpO2:  98% 95% 98%    Intake/Output Summary (Last 24 hours) at 04/25/15 1643 Last data filed at 04/25/15 0900  Gross per 24 hour  Intake    100 ml  Output   2950 ml  Net  -2850 ml   Filed Weights   04/21/15 2112 04/24/15 1644 04/24/15 2100  Weight: 86.9 kg (191 lb 9.3 oz) 89.8 kg (197 lb 15.6 oz) 87.8 kg (193 lb 9 oz)   Body mass index is 24.19 kg/(m^2).  Exam:   General:  Adult male, No acute distress, resting quietly with eyes closed  HEENT:  NCAT, MMM, temp HD catheter site covered by dressing  Cardiovascular:  RRR, nl S1, S2 no mrg, 2+ pulses, warm extremities  Respiratory:  CTAB, no increased WOB  Abdomen:   NABS, soft, NT/ND  MSK:   Severe muscle wasting of bilateral lower extremities with smooth skin and 1+ slow pitting edema.  Diminished pulses bilaterally.  Right foot with dried second toe.  Mild redness to right heel  Neuro:  Grossly moves all extremities and able to move legs a little bit to assist with exam.   Data Reviewed: Basic Metabolic Panel:  Recent Labs Lab 04/21/15 0532 04/22/15  1610 04/23/15 0525 04/24/15 0455 04/25/15 0456  NA 139 137 139 137 137  K 4.0 2.9* 2.9* 3.4* 3.3*  CL 102 99* 100* 101 99*  CO2 GLUCOSE 91 73 80 86 85  BUN 106* 53* 60* 71* 27*  CREATININE 9.24* 5.66* 6.60* 7.33* 4.02*  CALCIUM 7.7* 8.0* 8.1* 8.0* 8.0*  PHOS 8.3* 5.4* 6.4* 6.9* 3.9   Liver Function Tests:  Recent Labs Lab 04/21/15 0532 04/22/15 0528 04/23/15 0525 04/24/15 0455 04/25/15 0456  ALBUMIN 1.9* 2.0* 2.0* 1.9* 1.9*   No results for input(s): LIPASE, AMYLASE in the last 168 hours. No results for input(s): AMMONIA in the last 168 hours. CBC:  Recent Labs Lab 04/21/15 0532 04/22/15 0528 04/23/15 0525 04/24/15 0455 04/25/15 0456  WBC 11.7* 14.2* 14.5* 15.5* 18.4*  HGB  6.6* 9.4* 8.8* 8.7* 8.7*  HCT 19.7* 28.5* 26.9* 25.6* 26.1*  MCV 70.6* 74.0* 73.9* 75.3* 76.1*  PLT 209 181 181 161 171    No results found for this or any previous visit (from the past 240 hour(s)).   Studies: No results found.  Scheduled Meds: . amiodarone  200 mg Oral Daily  . amLODipine  5 mg Oral Daily  . carvedilol  6.25 mg Oral BID WC  . darbepoetin (ARANESP) injection - DIALYSIS  150 mcg Subcutaneous Q Tue-1800  . escitalopram  10 mg Oral QHS  . feeding supplement  1 Container Oral TID BM  . feeding supplement (PRO-STAT SUGAR FREE 64)  30 mL Oral TID  . fluconazole (DIFLUCAN) IV  100 mg Intravenous Q24H  . fluticasone furoate-vilanterol  1 puff Inhalation Daily  . heparin  5,000 Units Subcutaneous 3 times per day  . lamoTRIgine  50 mg Oral Daily  . levothyroxine  125 mcg Oral QAC breakfast  . pantoprazole  40 mg Oral Daily  . piperacillin-tazobactam (ZOSYN)  IV  2.25 g Intravenous 3 times per day  . polyethylene glycol  17 g Oral Daily  . saccharomyces boulardii  250 mg Oral BID  . sodium chloride flush  3 mL Intravenous Q12H   Continuous Infusions:    Principal Problem:   Sepsis (HCC) Active Problems:   UTI (urinary tract infection)   Protein-calorie malnutrition, severe (HCC)   AKI (acute kidney injury) (HCC)   Failure to thrive in adult   Abscess of second toe of right foot   Osteomyelitis (HCC)    Time spent: 30 min    Nike Southers  [facility] Pager [phone]. If 7PM-7AM, please contact night-coverage at [email or web address], password [facility] 04/25/2015, 4:43 PM  LOS: 12 days

## 2015-04-25 NOTE — Progress Notes (Signed)
Subjective:  Reasonable urine output at 1 L last 24 hours- had HD with 2 liters removed yesterday as well  Objective Vital signs in last 24 hours: Filed Vitals:   04/24/15 2100 04/24/15 2105 04/25/15 0418 04/25/15 1000  BP: 156/77 170/74 132/61   Pulse: 78 76 75   Temp: 97.6 F (36.4 C)  98.6 F (37 C)   TempSrc:   Axillary   Resp: 17  16   Height:      Weight: 87.8 kg (193 lb 9 oz)     SpO2:   98% 95%   Weight change:   Intake/Output Summary (Last 24 hours) at 04/25/15 1030 Last data filed at 04/25/15 0418  Gross per 24 hour  Intake    160 ml  Output   3100 ml  Net  -2940 ml    Assessment/ Plan: Pt is a 70 y.o. yo male who was admitted on 04/13/2015 with foot ulcer [name]/osteo- developed AKI insetting of supratherapeutic vancomycin levels Assessment/Plan: 1. Renal- baseline renal function normal. Developed AKI the setting of foot ulcer/ostial and supratherapeutic vancomycin levels. He is now nonoliguric. He is not a candidate for long-term dialysis support, however, during the course of this hospitalization doing dialysis PRN. Had HD on 4/7 and 4/8 and 4/12- now watch and do PRN  2. Foot ulcer/osteo- medical management for now. However, in conversations with palliative care patient has consented to amputation if absolutely necessary 3. Anemia- status post Feraheme as well as transfusions- most recently on 4/8. Have added ESA 4. HTN/volume- is nonoliguric and hypertensive. On Norvasc, Coreg - BP much better today after 3 liters off with UOP/HD  5. Hypokalemia-  ran on 4 K bath yest- give some today   Zaara Sprowl A    Labs: Basic Metabolic Panel:  Recent Labs Lab 04/23/15 0525 04/24/15 0455 04/25/15 0456  NA 139 137 137  K 2.9* 3.4* 3.3*  CL 100* 101 99*  CO2 25 23 27   GLUCOSE 80 86 85  BUN 60* 71* 27*  CREATININE 6.60* 7.33* 4.02*  CALCIUM 8.1* 8.0* 8.0*  PHOS 6.4* 6.9* 3.9   Liver Function Tests:  Recent Labs Lab 04/23/15 0525 04/24/15 0455  04/25/15 0456  ALBUMIN 2.0* 1.9* 1.9*   No results for input(s): LIPASE, AMYLASE in the last 168 hours. No results for input(s): AMMONIA in the last 168 hours. CBC:  Recent Labs Lab 04/21/15 0532 04/22/15 0528 04/23/15 0525 04/24/15 0455 04/25/15 0456  WBC 11.7* 14.2* 14.5* 15.5* 18.4*  HGB 6.6* 9.4* 8.8* 8.7* 8.7*  HCT 19.7* 28.5* 26.9* 25.6* 26.1*  MCV 70.6* 74.0* 73.9* 75.3* 76.1*  PLT 209 181 181 161 171   Cardiac Enzymes: No results for input(s): CKTOTAL, CKMB, CKMBINDEX, TROPONINI in the last 168 hours. CBG:  Recent Labs Lab 04/24/15 1143 04/24/15 2209 04/25/15 0007 04/25/15 0407 04/25/15 0725  GLUCAP 70 75 142* 81 79    Iron Studies: No results for input(s): IRON, TIBC, TRANSFERRIN, FERRITIN in the last 72 hours. Studies/Results: No results found. Medications: Infusions:    Scheduled Medications: . amiodarone  200 mg Oral Daily  . amLODipine  5 mg Oral Daily  . carvedilol  6.25 mg Oral BID WC  . darbepoetin (ARANESP) injection - DIALYSIS  150 mcg Subcutaneous Q Tue-1800  . escitalopram  10 mg Oral QHS  . feeding supplement  1 Container Oral TID BM  . feeding supplement (PRO-STAT SUGAR FREE 64)  30 mL Oral TID  . fluconazole (DIFLUCAN) IV  100 mg Intravenous  Q24H  . fluticasone furoate-vilanterol  1 puff Inhalation Daily  . heparin  5,000 Units Subcutaneous 3 times per day  . lamoTRIgine  50 mg Oral Daily  . levothyroxine  125 mcg Oral QAC breakfast  . pantoprazole  40 mg Oral Daily  . piperacillin-tazobactam (ZOSYN)  IV  2.25 g Intravenous 3 times per day  . polyethylene glycol  17 g Oral Daily  . sodium chloride flush  3 mL Intravenous Q12H    have reviewed scheduled and prn medications.  Physical Exam: General: Eyes closed, no complaints Heart: Regular rate and rhythm Lungs: Mostly clear Abdomen: Soft, nontender Extremities: Pitting dependent edema Dialysis Access: Right IJ HD cath placed on 4/7    04/25/2015,10:30 AM  LOS: 12 days

## 2015-04-26 DIAGNOSIS — T368X5S Adverse effect of other systemic antibiotics, sequela: Secondary | ICD-10-CM

## 2015-04-26 DIAGNOSIS — N170 Acute kidney failure with tubular necrosis: Secondary | ICD-10-CM

## 2015-04-26 LAB — CBC
HEMATOCRIT: 25.9 % — AB (ref 39.0–52.0)
Hemoglobin: 8.1 g/dL — ABNORMAL LOW (ref 13.0–17.0)
MCH: 24.4 pg — ABNORMAL LOW (ref 26.0–34.0)
MCHC: 31.3 g/dL (ref 30.0–36.0)
MCV: 78 fL (ref 78.0–100.0)
PLATELETS: 187 10*3/uL (ref 150–400)
RBC: 3.32 MIL/uL — AB (ref 4.22–5.81)
RDW: 25.8 % — AB (ref 11.5–15.5)
WBC: 13.1 10*3/uL — AB (ref 4.0–10.5)

## 2015-04-26 LAB — RENAL FUNCTION PANEL
ALBUMIN: 1.7 g/dL — AB (ref 3.5–5.0)
Anion gap: 12 (ref 5–15)
BUN: 33 mg/dL — AB (ref 6–20)
CHLORIDE: 100 mmol/L — AB (ref 101–111)
CO2: 26 mmol/L (ref 22–32)
CREATININE: 5.19 mg/dL — AB (ref 0.61–1.24)
Calcium: 8.2 mg/dL — ABNORMAL LOW (ref 8.9–10.3)
GFR, EST AFRICAN AMERICAN: 12 mL/min — AB (ref >60)
GFR, EST NON AFRICAN AMERICAN: 10 mL/min — AB (ref >60)
Glucose, Bld: 71 mg/dL (ref 65–99)
PHOSPHORUS: 5.9 mg/dL — AB (ref 2.5–4.6)
POTASSIUM: 3.7 mmol/L (ref 3.5–5.1)
Sodium: 138 mmol/L (ref 135–145)

## 2015-04-26 LAB — GLUCOSE, CAPILLARY
GLUCOSE-CAPILLARY: 75 mg/dL (ref 65–99)
GLUCOSE-CAPILLARY: 75 mg/dL (ref 65–99)
GLUCOSE-CAPILLARY: 91 mg/dL (ref 65–99)
GLUCOSE-CAPILLARY: 109 mg/dL — AB (ref 65–99)
Glucose-Capillary: 87 mg/dL (ref 65–99)

## 2015-04-26 LAB — VANCOMYCIN, RANDOM: Vancomycin Rm: 71 ug/mL

## 2015-04-26 NOTE — Care Management Important Message (Signed)
Important Message  Patient Details  Name: Elayne GuerinWilliam Bechler MRN: [id number] Date of Birth: 15-Oct-1945   Medicare Important Message Given:  Yes    Maryann Mccall, Annamarie MajorCheryl U, RN 04/26/2015, 10:45 AM

## 2015-04-26 NOTE — Progress Notes (Addendum)
[facility] PROGRESS NOTE  Elayne GuerinWilliam Oler QMV:[id number] DOB: 01-Apr-1945 DOA: 04/13/2015 PCP: Kirt Boysarter, Monica, DO  Brief narrative 70 year old with history of peripheral vascular disease, chronic right toe ulcer, nonambulatory at baseline with poor functional status, severe protein calorie malnutrition, who was seen by a surgeon  on 03/30/2015 and  found to have frank pus draining from right second toe. Recommended for hospital admission with orthopedic evaluation but patient refused and was discharged home with plan to treat him on vancomycin and Zosyn.  Patient sent from skilled nursing facility and admitted to hospitalist service on 04/14/2015 due to acute encephalopathy likely secondary to renal failure from vancomycin toxicity. His vancomycin level on admission was 146.  X-rays of his right foot showed lytic destruction of the second proximal phalanx consistent with osteomyelitis. His creatinine on admission was 8.6, with a baseline creatinine 0.9. His creatinine continued to get worse and patient became severely uremic.  HD catheter placed and patient started on intermittent hemodialysis.    Assessment/Plan: Suspected sepsis Could be due to foot osteomyelitis. Still has leukocytosis however remains afebrile. On empiric Zosyn , recommended at least until 4/28 (6 weeks course). Vancomycin discontinued on admission, level is supratherapeutic but improving. Plan to start daptomycin once level subtherapeutic. (73 today) Blood cultures have been negative.  Acute tubular necrosis with oliguric renal failure Suspect vancomycin-induced nephrotoxicity with hypovolemia. Renal following. Started on NG on 4/7 with temporary HD catheter placed. Has decent urine output and renal function slowly improving. He is not a candidate for long-term dialysis. Received dialysis on 4/7, 4/8 and 4/12. Renal now recommending to monitor with when necessary dialysis. Monitor strict I/O.  Acute osteomyelitis of  right foot with underlying peripheral vascular disease On empiric antibiotic. X-ray showed likely destruction of the second proximal phalanx. Dr. Lajoyce Cornersuda recommends continued medical management and if condition worsens he will need above-knee amputation.   Acute hypoxic and hypercarbic respiratory failure Resolved. Chest x-ray unremarkable. ABG showing respiratory acidosis. VQ scan done on 4/2 showed low probably for PE.   Acute uremic/metabolic encephalopathy Resolved with hemodialysis. Continue aspiration precautions.  Intermittent hypoglycemia Likely due to poor by mouth intake. Monitor CBC closely.  A. fib Rate controlled. Continue beta blocker and amiodarone. Not on anticoagulation.  COPD Continue albuterol nebs when necessary  Severe protein calorie malnutrition Continue supplement.  Candida UTI On Diflucan until 4/14.  Microcytic anemia Stable. Received PRBC on 4/2 and 4/8. Received faraheme on 4/1  Stage 1 pressure ulcer on heel and sacrum.  -allevyn dressings  DVT prophylaxis: Subcutaneous heparin  Diet: Dysphagia level III with supplements   Code Status: DO NOT RESUSCITATE Family Communication: None at bedside. POA is [name] Disposition Plan: Eventually need skilled nursing facility. Hospital course prolonged due to ATN and ongoing vancomycin toxicity   Consultants:  Renal  Dr. Lajoyce Cornersuda  palliative care  Procedures:  Right IJ catheter  Hemodialysis    Antibiotics: Vancomycin 3/17-4/1 (levels still supratherapeutic) IV Zosyn 3/17-4/28 (6 weeks course) Diflucan 4/7-4/14   HPI/Subjective: Seen and examined. Denies any specific symptoms.  Objective: Filed Vitals:   04/26/15 0441 04/26/15 1000  BP: 126/42 144/46  Pulse: 59 72  Temp: 98.4 F (36.9 C) 98.4 F (36.9 C)  Resp: 18 20    Intake/Output Summary (Last 24 hours) at 04/26/15 1319 Last data filed at 04/26/15 0900  Gross per 24 hour  Intake    360 ml  Output   1225 ml   Net   -865 ml   [employer] PowerFiled Weights  04/21/15 2112 04/24/15 1644 04/24/15 2100  Weight: 86.9 kg (191 lb 9.3 oz) 89.8 kg (197 lb 15.6 oz) 87.8 kg (193 lb 9 oz)    Exam:   General:  Elderly male appears frail, not in distress    HEENT: Temporal wasting, Pallor present, moist mucosa, right IJ, supple neck  Chest: Clear bilaterally, no added sounds  Cardiovascular: Normal S1 and S2, no murmurs rub or gallop  GI: Soft, nondistended, nontender, colostomy bag in place, suprapubic catheter  Musculoskeletal: Dried second toe of right foot, scattered erythema, severe muscle wasting  CNS: Alert and oriented  Data Reviewed: Basic Metabolic Panel:  Recent Labs Lab 04/22/15 0528 04/23/15 0525 04/24/15 0455 04/25/15 0456 04/26/15 0440  NA 137 139 137 137 138  K 2.9* 2.9* 3.4* 3.3* 3.7  CL 99* 100* 101 99* 100*  CO2 GLUCOSE 73 80 86 85 71  BUN 53* 60* 71* 27* 33*  CREATININE 5.66* 6.60* 7.33* 4.02* 5.19*  CALCIUM 8.0* 8.1* 8.0* 8.0* 8.2*  PHOS 5.4* 6.4* 6.9* 3.9 5.9*   Liver Function Tests:  Recent Labs Lab 04/22/15 0528 04/23/15 0525 04/24/15 0455 04/25/15 0456 04/26/15 0440  ALBUMIN 2.0* 2.0* 1.9* 1.9* 1.7*   No results for input(s): LIPASE, AMYLASE in the last 168 hours. No results for input(s): AMMONIA in the last 168 hours. CBC:  Recent Labs Lab 04/22/15 0528 04/23/15 0525 04/24/15 0455 04/25/15 0456 04/26/15 0440  WBC 14.2* 14.5* 15.5* 18.4* 13.1*  HGB 9.4* 8.8* 8.7* 8.7* 8.1*  HCT 28.5* 26.9* 25.6* 26.1* 25.9*  MCV 74.0* 73.9* 75.3* 76.1* 78.0  PLT 181 181 161 171 187   Cardiac Enzymes: No results for input(s): CKTOTAL, CKMB, CKMBINDEX, TROPONINI in the last 168 hours. BNP (last 3 results) No results for input(s): BNP in the last 8760 hours.  ProBNP (last 3 results) No results for input(s): PROBNP in the last 8760 hours.  CBG:  Recent Labs Lab 04/25/15 1650 04/25/15 2017 04/26/15 0529 04/26/15 0740 04/26/15 1147  GLUCAP 76  73 75 75 91    No results found for this or any previous visit (from the past 240 hour(s)).   Studies: No results found.  Scheduled Meds: . amiodarone  200 mg Oral Daily  . amLODipine  5 mg Oral Daily  . carvedilol  6.25 mg Oral BID WC  . darbepoetin (ARANESP) injection - DIALYSIS  150 mcg Subcutaneous Q Tue-1800  . escitalopram  10 mg Oral QHS  . feeding supplement  1 Container Oral TID BM  . feeding supplement (PRO-STAT SUGAR FREE 64)  30 mL Oral TID  . fluconazole (DIFLUCAN) IV  100 mg Intravenous Q24H  . fluticasone furoate-vilanterol  1 puff Inhalation Daily  . heparin  5,000 Units Subcutaneous 3 times per day  . lamoTRIgine  50 mg Oral Daily  . levothyroxine  125 mcg Oral QAC breakfast  . pantoprazole  40 mg Oral Daily  . piperacillin-tazobactam (ZOSYN)  IV  2.25 g Intravenous 3 times per day  . polyethylene glycol  17 g Oral Daily  . saccharomyces boulardii  250 mg Oral BID  . sodium chloride flush  3 mL Intravenous Q12H   Continuous Infusions:     Time spent: 25 minutes    Charly Hunton  [facility] Pager [phone] If 7PM-7AM, please contact night-coverage at [email or web address], password [facility] 04/26/2015, 1:19 PM  LOS: 13 days

## 2015-04-26 NOTE — Progress Notes (Signed)
Subjective:  Reasonable urine output at 1 L last 24 hours- creatinine rising off of HD Objective Vital signs in last 24 hours: Filed Vitals:   04/25/15 1750 04/25/15 2026 04/26/15 0441 04/26/15 0926  BP: 139/47 143/47 126/42   Pulse: 61 62 59   Temp: 98.2 F (36.8 C) 97.9 F (36.6 C) 98.4 F (36.9 C)   TempSrc: Oral Oral Oral   Resp: Height:      Weight:      SpO2: 98% 100% 100% 100%   Weight change:   Intake/Output Summary (Last 24 hours) at 04/26/15 1012 Last data filed at 04/26/15 0443  Gross per 24 hour  Intake    240 ml  Output   1125 ml  Net   -885 ml    Assessment/ Plan: Pt is a 70 y.o. yo male who was admitted on 04/13/2015 with foot ulcer Craig [name]- developed AKI insetting of supratherapeutic vancomycin levels Assessment/Plan: 1. Renal- baseline renal function normal. Developed AKI the setting of foot ulcer/ostial and supratherapeutic vancomycin levels. He is now nonoliguric. He is not a candidate for long-term dialysis support, however, during the course of this hospitalization doing dialysis PRN. Had HD on 4/7 , 4/8 and 4/12- now watch and do PRN - no need today  2. Foot ulcer/osteo- medical management for now. However, in conversations with palliative care patient has consented to amputation if absolutely necessary 3. Anemia- status post Feraheme as well as transfusions- most recently on 4/8. Have added ESA- last hgb 8.1 4. HTN/volume- is nonoliguric . On Norvasc, Coreg - BP much better today after 3 liters off with UOP/HD on 4/11 5. Hypokalemia-  ran on 4 K bath 4/11- gave some yest- better today   Craig [name] A    Labs: Basic Metabolic Panel:  Recent Labs Lab 04/24/15 0455 04/25/15 0456 04/26/15 0440  NA 137 137 138  K 3.4* 3.3* 3.7  CL 101 99* 100*  CO2 GLUCOSE 86 85 71  BUN 71* 27* 33*  CREATININE 7.33* 4.02* 5.19*  CALCIUM 8.0* 8.0* 8.2*  PHOS 6.9* 3.9 5.9*   Liver Function Tests:  Recent Labs Lab 04/24/15 0455  04/25/15 0456 04/26/15 0440  ALBUMIN 1.9* 1.9* 1.7*   No results for input(s): LIPASE, AMYLASE in the last 168 hours. No results for input(s): AMMONIA in the last 168 hours. CBC:  Recent Labs Lab 04/22/15 0528 04/23/15 0525 04/24/15 0455 04/25/15 0456 04/26/15 0440  WBC 14.2* 14.5* 15.5* 18.4* 13.1*  HGB 9.4* 8.8* 8.7* 8.7* 8.1*  HCT 28.5* 26.9* 25.6* 26.1* 25.9*  MCV 74.0* 73.9* 75.3* 76.1* 78.0  PLT 181 181 161 171 187   Cardiac Enzymes: No results for input(s): CKTOTAL, CKMB, CKMBINDEX, TROPONINI in the last 168 hours. CBG:  Recent Labs Lab 04/25/15 1201 04/25/15 1650 04/25/15 2017 04/26/15 0529 04/26/15 0740  GLUCAP 82 76 73 75 75    Iron Studies: No results for input(s): IRON, TIBC, TRANSFERRIN, FERRITIN in the last 72 hours. Studies/Results: No results found. Medications: Infusions:    Scheduled Medications: . amiodarone  200 mg Oral Daily  . amLODipine  5 mg Oral Daily  . carvedilol  6.25 mg Oral BID WC  . darbepoetin (ARANESP) injection - DIALYSIS  150 mcg Subcutaneous Q Tue-1800  . escitalopram  10 mg Oral QHS  . feeding supplement  1 Container Oral TID BM  . feeding supplement (PRO-STAT SUGAR FREE 64)  30 mL Oral TID  . fluconazole (DIFLUCAN) IV  100 mg Intravenous Q24H  . fluticasone furoate-vilanterol  1 puff Inhalation Daily  . heparin  5,000 Units Subcutaneous 3 times per day  . lamoTRIgine  50 mg Oral Daily  . levothyroxine  125 mcg Oral QAC breakfast  . pantoprazole  40 mg Oral Daily  . piperacillin-tazobactam (ZOSYN)  IV  2.25 g Intravenous 3 times per day  . polyethylene glycol  17 g Oral Daily  . saccharomyces boulardii  250 mg Oral BID  . sodium chloride flush  3 mL Intravenous Q12H    have reviewed scheduled and prn medications.  Physical Exam: General: Eyes closed, no complaints Heart: Regular rate and rhythm Lungs: Mostly clear Abdomen: Soft, nontender Extremities: Pitting dependent edema Dialysis Access: Right IJ HD cath  placed on 4/7    04/26/2015,10:12 AM  LOS: 13 days

## 2015-04-27 LAB — CBC
HCT: 25.5 % — ABNORMAL LOW (ref 39.0–52.0)
Hemoglobin: 7.9 g/dL — ABNORMAL LOW (ref 13.0–17.0)
MCH: 24.3 pg — ABNORMAL LOW (ref 26.0–34.0)
MCHC: 31 g/dL (ref 30.0–36.0)
MCV: 78.5 fL (ref 78.0–100.0)
PLATELETS: 209 10*3/uL (ref 150–400)
RBC: 3.25 MIL/uL — ABNORMAL LOW (ref 4.22–5.81)
RDW: 26.1 % — AB (ref 11.5–15.5)
WBC: 13 10*3/uL — AB (ref 4.0–10.5)

## 2015-04-27 LAB — RENAL FUNCTION PANEL
ALBUMIN: 1.8 g/dL — AB (ref 3.5–5.0)
Anion gap: 14 (ref 5–15)
BUN: 36 mg/dL — AB (ref 6–20)
CALCIUM: 8.3 mg/dL — AB (ref 8.9–10.3)
CHLORIDE: 101 mmol/L (ref 101–111)
CO2: 25 mmol/L (ref 22–32)
CREATININE: 6.4 mg/dL — AB (ref 0.61–1.24)
GFR calc Af Amer: 9 mL/min — ABNORMAL LOW (ref >60)
GFR, EST NON AFRICAN AMERICAN: 8 mL/min — AB (ref >60)
Glucose, Bld: 101 mg/dL — ABNORMAL HIGH (ref 65–99)
PHOSPHORUS: 5.9 mg/dL — AB (ref 2.5–4.6)
Potassium: 3.5 mmol/L (ref 3.5–5.1)
SODIUM: 140 mmol/L (ref 135–145)

## 2015-04-27 LAB — GLUCOSE, CAPILLARY
GLUCOSE-CAPILLARY: 78 mg/dL (ref 65–99)
GLUCOSE-CAPILLARY: 85 mg/dL (ref 65–99)
GLUCOSE-CAPILLARY: 87 mg/dL (ref 65–99)
Glucose-Capillary: 110 mg/dL — ABNORMAL HIGH (ref 65–99)
Glucose-Capillary: 117 mg/dL — ABNORMAL HIGH (ref 65–99)
Glucose-Capillary: 90 mg/dL (ref 65–99)
Glucose-Capillary: 119 mg/dL — ABNORMAL HIGH (ref 65–99)

## 2015-04-27 NOTE — Progress Notes (Signed)
Subjective:  Only 550 of UOP recorded   last 24 hours- creatinine rising off of HD Objective Vital signs in last 24 hours: Filed Vitals:   04/26/15 1937 04/27/15 0639 04/27/15 0845 04/27/15 0912  BP:  143/50  134/48  Pulse:  66  65  Temp:  98.2 F (36.8 C)  98.6 F (37 C)  TempSrc:  Oral  Oral  Resp:  16  18  Height:      Weight: 84 kg (185 lb 3 oz)     SpO2:  99% 98% 98%   Weight change:   Intake/Output Summary (Last 24 hours) at 04/27/15 1101 Last data filed at 04/27/15 0913  Gross per 24 hour  Intake    280 ml  Output    700 ml  Net   -420 ml    Assessment/ Plan: Pt is [name] 70 y.o. yo male who was admitted on 04/13/2015 with foot ulcer [name]/osteo- developed AKI in setting of ATN/supratherapeutic vancomycin levels Assessment/Plan: 1. Renal- baseline renal function normal. Developed AKI the setting of foot ulcer/ostial and supratherapeutic vancomycin levels. He is now nonoliguric. He is not [name] candidate for long-term dialysis support, however, during the course of this hospitalization doing dialysis PRN. Had HD on 4/7 , 4/8 and 4/11- now watch and do PRN - wanted to do HD today - pt said he did not feel like it and would rather do tomorrow- even though he could not give me [name] great reason- will put off to tomorrow 2. Foot ulcer/osteo- medical management for now. However, in conversations with palliative care patient has consented to amputation if absolutely necessary 3. Anemia- status post Feraheme as well as transfusions- most recently on 4/8. Have added ESA- last hgb 7.9 4. HTN/volume- is nonoliguric . On Norvasc, Coreg - BP adequate- minimal UF as am hoping renal function will recover 5. Hypokalemia-  4 K bath 4/11 as well as PRN repletion     Craig [name]    Labs: Basic Metabolic Panel:  Recent Labs Lab 04/25/15 0456 04/26/15 0440 04/27/15 0500  NA 137 138 140  K 3.3* 3.7 3.5  CL 99* 100* 101  CO2 27 26 25   GLUCOSE 85 71 101*  BUN 27* 33* 36*  CREATININE 4.02*  5.19* 6.40*  CALCIUM 8.0* 8.2* 8.3*  PHOS 3.9 5.9* 5.9*   Liver Function Tests:  Recent Labs Lab 04/25/15 0456 04/26/15 0440 04/27/15 0500  ALBUMIN 1.9* 1.7* 1.8*   No results for input(s): LIPASE, AMYLASE in the last 168 hours. No results for input(s): AMMONIA in the last 168 hours. CBC:  Recent Labs Lab 04/23/15 0525 04/24/15 0455 04/25/15 0456 04/26/15 0440 04/27/15 0500  WBC 14.5* 15.5* 18.4* 13.1* 13.0*  HGB 8.8* 8.7* 8.7* 8.1* 7.9*  HCT 26.9* 25.6* 26.1* 25.9* 25.5*  MCV 73.9* 75.3* 76.1* 78.0 78.5  PLT 181 161 171 187 209   Cardiac Enzymes: No results for input(s): CKTOTAL, CKMB, CKMBINDEX, TROPONINI in the last 168 hours. CBG:  Recent Labs Lab 04/26/15 1623 04/26/15 1931 04/27/15 0027 04/27/15 0351 04/27/15 0803  GLUCAP 109* 87 78 110* 85    Iron Studies: No results for input(s): IRON, TIBC, TRANSFERRIN, FERRITIN in the last 72 hours. Studies/Results: No results found. Medications: Infusions:    Scheduled Medications: . amiodarone  200 mg Oral Daily  . amLODipine  5 mg Oral Daily  . carvedilol  6.25 mg Oral BID WC  . darbepoetin (ARANESP) injection - DIALYSIS  150 mcg Subcutaneous Q Tue-1800  . escitalopram  10 mg Oral QHS  . feeding supplement  1 Container Oral TID BM  . feeding supplement (PRO-STAT SUGAR FREE 64)  30 mL Oral TID  . fluconazole (DIFLUCAN) IV  100 mg Intravenous Q24H  . fluticasone furoate-vilanterol  1 puff Inhalation Daily  . heparin  5,000 Units Subcutaneous 3 times per day  . lamoTRIgine  50 mg Oral Daily  . levothyroxine  125 mcg Oral QAC breakfast  . pantoprazole  40 mg Oral Daily  . piperacillin-tazobactam (ZOSYN)  IV  2.25 g Intravenous 3 times per day  . polyethylene glycol  17 g Oral Daily  . saccharomyces boulardii  250 mg Oral BID  . sodium chloride flush  3 mL Intravenous Q12H    have reviewed scheduled and prn medications.  Physical Exam: General: Eyes closed, no complaints Heart: Regular rate and  rhythm Lungs: Mostly clear Abdomen: Soft, nontender Extremities: Pitting dependent edema Dialysis Access: Right IJ HD cath placed on 4/7    04/27/2015,11:01 AM  LOS: 14 days

## 2015-04-27 NOTE — Progress Notes (Signed)
Nutrition Follow-up  DOCUMENTATION CODES:   Severe malnutrition in context of chronic illness  INTERVENTION:  Continue Boost Breeze po TID, each supplement provides 250 kcal and 9 grams of protein.  Continue 30 ml Prostat po TID, each supplement provides 100 kcal and 15 grams of protein.   Provide nourishment snacks between meals. (Ordered)  Encourage adequate PO intake.   NUTRITION DIAGNOSIS:   Increased nutrient needs related to wound healing as evidenced by estimated needs; ongoing  GOAL:   Patient will meet greater than or equal to 90% of their needs; progressing  MONITOR:   PO intake, Supplement acceptance, Weight trends, Labs, I & O's, Skin  REASON FOR ASSESSMENT:   Consult Assessment of nutrition requirement/status  ASSESSMENT:   Craig [name] is a 70 y.o. gentleman with a history of COPD, atrial fibrillation, PVD, malnutrition, and chronic wounds who is a long-term SNF patient (according to his emergency contact Kathlen BrunswickWilliam Coleman, the patient has a sister and brother that have been estranged for the past several years).  Meal completion has been varied from 25-75%. Pt currently has Boost Breeze and Prostat ordered with varied consumption. RD to continue with current orders to aid in caloric and protein needs. RD to additionally order nourishment snacks between meals. Pt encouraged to eat his food at meals and to consume his supplements.   Labs: phosphorous elevated at 5.9.  Diet Order:  DIET DYS 3 Room service appropriate?: Yes with Assist; Fluid consistency:: Thin  Skin:  Wound (see comment) (Stage I on R heel, incision on R toe)  Last BM:  4/14-colostomy  Height:   Ht Readings from Last 1 Encounters:  04/13/15 6\' 3"  (1.905 m)    Weight:   Wt Readings from Last 1 Encounters:  04/26/15 185 lb 3 oz (84 kg)    Ideal Body Weight:  89.1 kg  BMI:  Body mass index is 23.15 kg/(m^2).  Estimated Nutritional Needs:   Kcal:  2100-2300  Protein:  105-120  grams  Fluid:  per MD  EDUCATION NEEDS:   No education needs identified at this time  Roslyn SmilingStephanie Mae Denunzio, MS, RD, LDN Pager # [phone] After hours/ weekend pager # [phone]

## 2015-04-27 NOTE — Progress Notes (Signed)
[facility] PROGRESS NOTE  Craig [name] ZOX:[id number] DOB: [birth year] DOA: 04/13/2015 PCP: Kirt Boys, DO  Brief narrative 70 year old with history of peripheral vascular disease, chronic right toe ulcer, nonambulatory at baseline with poor functional status, severe protein calorie malnutrition, who was seen by a surgeon  on 03/30/2015 and  found to have frank pus draining from right second toe. Recommended for hospital admission with orthopedic evaluation but patient refused and was discharged home with plan to treat him on vancomycin and Zosyn.  Patient sent from skilled nursing facility and admitted to hospitalist service on 04/14/2015 due to acute encephalopathy likely secondary to renal failure from vancomycin toxicity. His vancomycin level on admission was 146.  X-rays of his right foot showed lytic destruction of the second proximal phalanx consistent with osteomyelitis. His creatinine on admission was 8.6, with a baseline creatinine 0.9. His creatinine continued to get worse and patient became severely uremic.  HD catheter placed and patient started on intermittent hemodialysis.    Assessment/Plan:  Suspected sepsis Could be due to foot osteomyelitis. Still has leukocytosis however remains afebrile. On empiric Zosyn , recommended at least until 4/28 (6 weeks course). Vancomycin discontinued on admission, level is supratherapeutic but improving. Plan to start daptomycin once level subtherapeutic.  Blood cultures have been negative.  Acute tubular necrosis with oliguric renal failure Suspect vancomycin-induced nephrotoxicity with hypovolemia. Renal following. Started on NG on 4/7 with temporary HD catheter placed. Has decent urine output and renal function slowly improving. He is not a candidate for long-term dialysis. Received dialysis on 4/7, 4/8 and 4/12. Renal now recommending to monitor with when necessary dialysis. Monitor strict I/O.  Acute osteomyelitis of right  foot with underlying peripheral vascular disease On empiric antibiotic. X-ray showed likely destruction of the second proximal phalanx. Dr. Lajoyce Corners recommends continued medical management and if condition worsens he will need above-knee amputation.   Acute hypoxic and hypercarbic respiratory failure Resolved. Chest x-ray unremarkable. ABG showing respiratory acidosis. VQ scan done on 4/2 showed low probably for PE.   Acute uremic/metabolic encephalopathy Resolved with hemodialysis. Continue aspiration precautions.  Intermittent hypoglycemia Likely due to poor by mouth intake. Monitor CBC closely.  A. fib Rate controlled. Continue beta blocker and amiodarone. Not on anticoagulation.  COPD Continue albuterol nebs when necessary  Severe protein calorie malnutrition Continue supplement.  Candida UTI On Diflucan until 4/14.  Microcytic anemia Stable. Received PRBC on 4/2 and 4/8. Received faraheme on 4/1  Stage 1 pressure ulcer on heel and sacrum.  -allevyn dressings  DVT prophylaxis: Subcutaneous heparin  Diet: Dysphagia level III with supplements   Code Status: DO NOT RESUSCITATE Family Communication: None at bedside. POA is Mr. [name] Disposition Plan: Eventually need skilled nursing facility. Hospital course prolonged due to ATN and ongoing vancomycin toxicity   Consultants:  Renal  Dr. Lajoyce Corners  palliative care  Procedures:  Right IJ catheter  Hemodialysis   Antibiotics: Vancomycin 3/17-4/1 (levels still supratherapeutic) IV Zosyn 3/17-4/28 (6 weeks course) Diflucan 4/7-4/14   HPI/Subjective: Seen and examined. His cr is rising, but does not want to have dialysis today  Objective: Filed Vitals:   04/27/15 0912 04/27/15 1742  BP: 134/48 139/47  Pulse: 65 67  Temp: 98.6 F (37 C) 98.4 F (36.9 C)  Resp: 18 19    Intake/Output Summary (Last 24 hours) at 04/27/15 1916 Last data filed at 04/27/15 1900  Gross per 24 hour  Intake    260 ml   Output   1550 ml  Net  -  1290 ml   Filed Weights   04/24/15 1644 04/24/15 2100 04/26/15 1937  Weight: 89.8 kg (197 lb 15.6 oz) 87.8 kg (193 lb 9 oz) 84 kg (185 lb 3 oz)    Exam:   General:  Elderly male appears frail, not in distress    HEENT: Temporal wasting, Pallor present, moist mucosa, right IJ, supple neck  Chest: Clear bilaterally, no added sounds  Cardiovascular: Normal S1 and S2, no murmurs rub or gallop  GI: Soft, nondistended, nontender, colostomy bag in place, suprapubic catheter  Musculoskeletal: Dried second toe of right foot, scattered erythema, severe muscle wasting  CNS: Alert and oriented  Data Reviewed: Basic Metabolic Panel:  Recent Labs Lab 04/23/15 0525 04/24/15 0455 04/25/15 0456 04/26/15 0440 04/27/15 0500  NA 139 137 137 138 140  K 2.9* 3.4* 3.3* 3.7 3.5  CL 100* 101 99* 100* 101  CO2 25 23 27 26 25   GLUCOSE 80 86 85 71 101*  BUN 60* 71* 27* 33* 36*  CREATININE 6.60* 7.33* 4.02* 5.19* 6.40*  CALCIUM 8.1* 8.0* 8.0* 8.2* 8.3*  PHOS 6.4* 6.9* 3.9 5.9* 5.9*   Liver Function Tests:  Recent Labs Lab 04/23/15 0525 04/24/15 0455 04/25/15 0456 04/26/15 0440 04/27/15 0500  ALBUMIN 2.0* 1.9* 1.9* 1.7* 1.8*   No results for input(s): LIPASE, AMYLASE in the last 168 hours. No results for input(s): AMMONIA in the last 168 hours. CBC:  Recent Labs Lab 04/23/15 0525 04/24/15 0455 04/25/15 0456 04/26/15 0440 04/27/15 0500  WBC 14.5* 15.5* 18.4* 13.1* 13.0*  HGB 8.8* 8.7* 8.7* 8.1* 7.9*  HCT 26.9* 25.6* 26.1* 25.9* 25.5*  MCV 73.9* 75.3* 76.1* 78.0 78.5  PLT 181 161 171 187 209   Cardiac Enzymes: No results for input(s): CKTOTAL, CKMB, CKMBINDEX, TROPONINI in the last 168 hours. BNP (last 3 results) No results for input(s): BNP in the last 8760 hours.  ProBNP (last 3 results) No results for input(s): PROBNP in the last 8760 hours.  CBG:  Recent Labs Lab 04/27/15 0027 04/27/15 0351 04/27/15 0803 04/27/15 1218  04/27/15 1741  GLUCAP 78 110* 85 117* 90    No results found for this or any previous visit (from the past 240 hour(s)).   Studies: No results found.  Scheduled Meds: . amiodarone  200 mg Oral Daily  . amLODipine  5 mg Oral Daily  . carvedilol  6.25 mg Oral BID WC  . darbepoetin (ARANESP) injection - DIALYSIS  150 mcg Subcutaneous Q Tue-1800  . escitalopram  10 mg Oral QHS  . feeding supplement  1 Container Oral TID BM  . feeding supplement (PRO-STAT SUGAR FREE 64)  30 mL Oral TID  . fluconazole (DIFLUCAN) IV  100 mg Intravenous Q24H  . fluticasone furoate-vilanterol  1 puff Inhalation Daily  . heparin  5,000 Units Subcutaneous 3 times per day  . lamoTRIgine  50 mg Oral Daily  . levothyroxine  125 mcg Oral QAC breakfast  . pantoprazole  40 mg Oral Daily  . piperacillin-tazobactam (ZOSYN)  IV  2.25 g Intravenous 3 times per day  . polyethylene glycol  17 g Oral Daily  . saccharomyces boulardii  250 mg Oral BID  . sodium chloride flush  3 mL Intravenous Q12H   Continuous Infusions:     Time spent: 25 minutes    Nitika Jackowski MD PhD  [facility] Pager [phone] If 7PM-7AM, please contact night-coverage at [email or web address], password [email or web address] 04/27/2015, 7:16 PM  LOS: 14 days

## 2015-04-27 NOTE — Progress Notes (Signed)
Pharmacy Antibiotic Note  Craig GuerinWilliam [name] is a 70 y.o. male admitted on 04/13/2015 with osteomyeltitis noted to be on Vancomycin + Zosyn PTA and found to have AKI on admission. Pharmacy has been consulted for Daptomycin (once Vancomycin levels are <15 mcg/ml) and Zosyn dosing.  The patient's Vancomycin random yesterday morning remains SUPRAtherapeutic though trending down (VR 71 << 83 << 114, goal of 15-20 mcg/ml). The patient is producing UOP however does not appear to be filtering since SCr rising between HD sessions. The patient is receiving I-HD, last 4/11. Will plan to start Daptomycin when the VR has trended down to <15 mcg/ml.  The patient also continues on Zosyn and these doses remain appropriate at this time for the patient's renal function.   Plan: 1. Hold start of Daptomycin due to elevated Vancomycin levels 2. Continue Zosyn 2.25g/8h 3. Will continue to follow renal function, HD plans, and repeat VR as needed  Height: 6\' 3"  (190.5 cm) Weight: 185 lb 3 oz (84 kg) IBW/kg (Calculated) : 84.5  Temp (24hrs), Avg:98.4 F (36.9 C), Min:98.2 F (36.8 C), Max:98.6 F (37 C)   Recent Labs Lab 04/23/15 0525 04/24/15 0455 04/25/15 0456 04/26/15 0440 04/27/15 0500  WBC 14.5* 15.5* 18.4* 13.1* 13.0*  CREATININE 6.60* 7.33* 4.02* 5.19* 6.40*  VANCORANDOM  --  83*  --  71*  --     Estimated Creatinine Clearance: 13.1 mL/min (by C-G formula based on Cr of 6.4).    No Known Allergies  Antimicrobials this admission: Vanc PTA >> 4/1 [intended to stop 4/28] * 4/1 VR: 147 mcg/ml * 4/3 VR: 146 mcg/ml * 4/5 VR: 115 mcg/ml * 4/8 VR: 114 mcg/ml * 4/11 VR: 83 mcg/ml * 4/13 VR: 71 mcg/ml Zosyn PTA >> [intended to stop 4/28] Dapto (not yet started) Fluconazole 4/4 >> 4/14  Dose adjustments this admission: * 4/1 VR: 147 mcg/ml * 4/3 VR: 146 mcg/ml * 4/5 VR: 115 mcg/ml * 4/8 VR: 114 mcg/ml * 4/11 VR: 83 mcg/ml * 4/13 VR: 71 mcg/ml  Microbiology results: 4/1 MRSA PCR >>  positive 4/1 UCx >> 100k yeast + 40k PSA 4/1 BCx >> NG  Thank you for allowing pharmacy to be a part of this patient's care.  [name], 17[address] Rainbow BoulevardPharm.D., BCPS Clinical Pharmacist Pager: [phone] 04/27/2015 10:29 AM

## 2015-04-27 NOTE — Clinical Social Work Note (Signed)
[facility] and will return at discharge. CSW continuing to follow and facilitate discharge back to SNF when medically stable.  Genelle BalVanessa Rane Dumm, MSW, LCSW Licensed Clinical Social Worker Clinical Social Work Department [facility] [phone]

## 2015-04-28 DIAGNOSIS — N390 Urinary tract infection, site not specified: Secondary | ICD-10-CM

## 2015-04-28 LAB — CBC
HCT: 25.6 % — ABNORMAL LOW (ref 39.0–52.0)
Hemoglobin: 8.1 g/dL — ABNORMAL LOW (ref 13.0–17.0)
MCH: 24.8 pg — ABNORMAL LOW (ref 26.0–34.0)
MCHC: 31.6 g/dL (ref 30.0–36.0)
MCV: 78.5 fL (ref 78.0–100.0)
PLATELETS: 240 10*3/uL (ref 150–400)
RBC: 3.26 MIL/uL — ABNORMAL LOW (ref 4.22–5.81)
RDW: 26.3 % — AB (ref 11.5–15.5)
WBC: 15.5 10*3/uL — AB (ref 4.0–10.5)

## 2015-04-28 LAB — RENAL FUNCTION PANEL
ALBUMIN: 1.8 g/dL — AB (ref 3.5–5.0)
Anion gap: 14 (ref 5–15)
BUN: 40 mg/dL — AB (ref 6–20)
CALCIUM: 8.5 mg/dL — AB (ref 8.9–10.3)
CO2: 25 mmol/L (ref 22–32)
CREATININE: 7.53 mg/dL — AB (ref 0.61–1.24)
Chloride: 102 mmol/L (ref 101–111)
GFR calc Af Amer: 8 mL/min — ABNORMAL LOW (ref >60)
GFR, EST NON AFRICAN AMERICAN: 7 mL/min — AB (ref >60)
Glucose, Bld: 83 mg/dL (ref 65–99)
PHOSPHORUS: 6.7 mg/dL — AB (ref 2.5–4.6)
POTASSIUM: 3.2 mmol/L — AB (ref 3.5–5.1)
SODIUM: 141 mmol/L (ref 135–145)

## 2015-04-28 LAB — GLUCOSE, CAPILLARY
GLUCOSE-CAPILLARY: 81 mg/dL (ref 65–99)
GLUCOSE-CAPILLARY: 99 mg/dL (ref 65–99)
GLUCOSE-CAPILLARY: 79 mg/dL (ref 65–99)
Glucose-Capillary: 87 mg/dL (ref 65–99)
Glucose-Capillary: 78 mg/dL (ref 65–99)

## 2015-04-28 NOTE — Progress Notes (Signed)
[facility] PROGRESS NOTE  Elayne GuerinWilliam Schweiss VZD:[id number] DOB: 04/27/1945 DOA: 04/13/2015 PCP: Kirt Boysarter, Monica, DO  Brief narrative 70 year old with history of peripheral vascular disease, chronic right toe ulcer, nonambulatory at baseline with poor functional status, severe protein calorie malnutrition, who was seen by a surgeon  on 03/30/2015 and  found to have frank pus draining from right second toe. Recommended for hospital admission with orthopedic evaluation but patient refused and was discharged home with plan to treat him on vancomycin and Zosyn.  Patient sent from skilled nursing facility and admitted to hospitalist service on 04/14/2015 due to acute encephalopathy likely secondary to renal failure from vancomycin toxicity. His vancomycin level on admission was 146.  X-rays of his right foot showed lytic destruction of the second proximal phalanx consistent with osteomyelitis. His creatinine on admission was 8.6, with a baseline creatinine 0.9. His creatinine continued to get worse and patient became severely uremic.  HD catheter placed and patient started on intermittent hemodialysis.    Assessment/Plan:  Suspected sepsis Could be due to foot osteomyelitis. Still has leukocytosis however remains afebrile. On empiric Zosyn , recommended at least until 4/28 (6 weeks course). Vancomycin discontinued on admission, level is supratherapeutic but improving. Plan to start daptomycin once level subtherapeutic.  Blood cultures have been negative.  Acute tubular necrosis with oliguric renal failure Suspect vancomycin-induced nephrotoxicity with hypovolemia. Renal following. Started on NG on 4/7 with temporary HD catheter placed. Has decent urine output and renal function slowly improving. He is not a candidate for long-term dialysis. Received dialysis on 4/7, 4/8 and 4/11 and prn.  Prn HD per nephrology  Acute osteomyelitis of right foot with underlying peripheral vascular disease On  empiric antibiotic. X-ray showed likely destruction of the second proximal phalanx. Dr. Lajoyce Cornersuda recommends continued medical management and if condition worsens he will need above-knee amputation.   Acute hypoxic and hypercarbic respiratory failure Resolved. Chest x-ray unremarkable. ABG showing respiratory acidosis. VQ scan done on 4/2 showed low probably for PE.   Acute uremic/metabolic encephalopathy Resolved with hemodialysis. Continue aspiration precautions.  Intermittent hypoglycemia Likely due to poor by mouth intake. Monitor CBC closely.  A. Fib, paroxysmal  Currently in sinus rhythm, Rate controlled. Continue beta blocker and amiodarone. Not on anticoagulation. Has only been on asa for unclear reason, CHADSvasc score 3 with htn, pvd, age 70  This need to be addressed by pmd  COPD Continue albuterol nebs when necessary  Severe protein calorie malnutrition Continue supplement.  Candida UTI On Diflucan until 4/14.  Microcytic anemia Stable. Received PRBC on 4/2 and 4/8. Received faraheme on 4/1  Stage 1 pressure ulcer on heel and sacrum.  -allevyn dressings  Chronic colostomy and suprapubic catheter  DVT prophylaxis: Subcutaneous heparin  Diet: Dysphagia level III with supplements   Code Status: DO NOT RESUSCITATE Family Communication: None at bedside. POA is [name] Disposition Plan: Eventually need skilled nursing facility. Hospital course prolonged due to ATN and ongoing vancomycin toxicity   Consultants:  Renal  Dr. Lajoyce Cornersuda  palliative care  Procedures:  Right IJ catheter  Hemodialysis   Antibiotics: Vancomycin 3/17-4/1 (levels still supratherapeutic) IV Zosyn 3/17-4/28 (6 weeks course) Diflucan 4/7-4/14   HPI/Subjective: Seen and examined. Aaox3, denies pain, when asked why he has colostomy and suprapubic catheter, he replies "  i donot know why"  Objective: Filed Vitals:   04/28/15 0410 04/28/15 1000  BP: 146/51 163/55  Pulse:  70 88  Temp: 98.5 F (36.9 C) 98.2 F (36.8 C)  Resp: 18  18    Intake/Output Summary (Last 24 hours) at 04/28/15 1055 Last data filed at 04/28/15 0900  Gross per 24 hour  Intake    200 ml  Output   1425 ml  Net  -1225 ml   Filed Weights   04/24/15 2100 04/26/15 1937 04/27/15 2009  Weight: 87.8 kg (193 lb 9 oz) 84 kg (185 lb 3 oz) 85.6 kg (188 lb 11.4 oz)    Exam:   General:  Elderly male appears frail, not in distress    HEENT: Temporal wasting, Pallor present, moist mucosa, right IJ, supple neck  Chest: Clear bilaterally, no added sounds  Cardiovascular: Normal S1 and S2, no murmurs rub or gallop  GI: Soft, nondistended, nontender, colostomy bag in place, suprapubic catheter  Musculoskeletal: Dried second toe of right foot, scattered erythema, severe muscle wasting  CNS: Alert and oriented  Data Reviewed: Basic Metabolic Panel:  Recent Labs Lab 04/24/15 0455 04/25/15 0456 04/26/15 0440 04/27/15 0500 04/28/15 0401  NA 137 137 138 140 141  K 3.4* 3.3* 3.7 3.5 3.2*  CL 101 99* 100* 101 102  CO2 GLUCOSE 86 85 71 101* 83  BUN 71* 27* 33* 36* 40*  CREATININE 7.33* 4.02* 5.19* 6.40* 7.53*  CALCIUM 8.0* 8.0* 8.2* 8.3* 8.5*  PHOS 6.9* 3.9 5.9* 5.9* 6.7*   Liver Function Tests:  Recent Labs Lab 04/24/15 0455 04/25/15 0456 04/26/15 0440 04/27/15 0500 04/28/15 0401  ALBUMIN 1.9* 1.9* 1.7* 1.8* 1.8*   No results for input(s): LIPASE, AMYLASE in the last 168 hours. No results for input(s): AMMONIA in the last 168 hours. CBC:  Recent Labs Lab 04/24/15 0455 04/25/15 0456 04/26/15 0440 04/27/15 0500 04/28/15 0401  WBC 15.5* 18.4* 13.1* 13.0* 15.5*  HGB 8.7* 8.7* 8.1* 7.9* 8.1*  HCT 25.6* 26.1* 25.9* 25.5* 25.6*  MCV 75.3* 76.1* 78.0 78.5 78.5  PLT 161 171 187 209 240   Cardiac Enzymes: No results for input(s): CKTOTAL, CKMB, CKMBINDEX, TROPONINI in the last 168 hours. BNP (last 3 results) No results for input(s): BNP in the last  8760 hours.  ProBNP (last 3 results) No results for input(s): PROBNP in the last 8760 hours.  CBG:  Recent Labs Lab 04/27/15 1741 04/27/15 2006 04/27/15 2337 04/28/15 0405 04/28/15 0731  GLUCAP 90 119* 87 81 87    No results found for this or any previous visit (from the past 240 hour(s)).   Studies: No results found.  Scheduled Meds: . amiodarone  200 mg Oral Daily  . amLODipine  5 mg Oral Daily  . carvedilol  6.25 mg Oral BID WC  . darbepoetin (ARANESP) injection - DIALYSIS  150 mcg Subcutaneous Q Tue-1800  . escitalopram  10 mg Oral QHS  . feeding supplement  1 Container Oral TID BM  . feeding supplement (PRO-STAT SUGAR FREE 64)  30 mL Oral TID  . fluticasone furoate-vilanterol  1 puff Inhalation Daily  . heparin  5,000 Units Subcutaneous 3 times per day  . lamoTRIgine  50 mg Oral Daily  . levothyroxine  125 mcg Oral QAC breakfast  . pantoprazole  40 mg Oral Daily  . piperacillin-tazobactam (ZOSYN)  IV  2.25 g Intravenous 3 times per day  . polyethylene glycol  17 g Oral Daily  . saccharomyces boulardii  250 mg Oral BID  . sodium chloride flush  3 mL Intravenous Q12H   Continuous Infusions:     Time spent: 25 minutes    Jaymere Alen MD PhD  [facility] Pager [phone] If 7PM-7AM, please contact night-coverage at [email or web address], password [email or web address] 04/28/2015, 10:55 AM  LOS: 15 days

## 2015-04-28 NOTE — Progress Notes (Signed)
Subjective:  Only 550 of UOP recorded   last 24 hours- creatinine rising off of HD Objective Vital signs in last 24 hours: Filed Vitals:   04/27/15 0912 04/27/15 1742 04/27/15 2009 04/28/15 0410  BP: 134/48 139/47 150/50 146/51  Pulse: 65 67 73 70  Temp: 98.6 F (37 C) 98.4 F (36.9 C) 98.6 F (37 C) 98.5 F (36.9 C)  TempSrc: Oral Oral Oral   Resp: Height:      Weight:   85.6 kg (188 lb 11.4 oz)   SpO2: 98% 93% 93% 94%   Weight change: 1.6 kg (3 lb 8.4 oz)  Intake/Output Summary (Last 24 hours) at 04/28/15 1023 Last data filed at 04/28/15 0607  Gross per 24 hour  Intake    140 ml  Output   1425 ml  Net  -1285 ml    Assessment/ Plan: Pt is [name] 70 y.o. yo male who was admitted on 04/13/2015 with foot ulcer [name]- developed AKI in setting of ATN/supratherapeutic vancomycin levels Assessment/Plan: 1. Renal- baseline renal function normal.  Developed AKI the setting of foot ulcer/ostial and supratherapeutic vancomycin levels. He is now nonoliguric. He is not [name] candidate for long-term dialysis support, however, during the course of this hospitalization doing dialysis PRN. Had HD on 4/7 , 4/8 and 4/11- now watch and do PRN - going to do HD today -  2. Foot ulcer/osteo- medical management for now. However, in conversations with palliative care patient has consented to amputation if absolutely necessary 3. Anemia- status post Feraheme as well as transfusions- most recently on 4/8. Have added ESA- last hgb 8.1 4. HTN/volume- is nonoliguric . On Norvasc, Coreg - BP adequate- minimal UF as am hoping renal function will recover 5. Hypokalemia-  4 K bath 4/11 as well as PRN repletion        Craig [name]    Labs: Basic Metabolic Panel:  Recent Labs Lab 04/26/15 0440 04/27/15 0500 04/28/15 0401  NA 138 140 141  K 3.7 3.5 3.2*  CL 100* 101 102  CO2 GLUCOSE 71 101* 83  BUN 33* 36* 40*  CREATININE 5.19* 6.40* 7.53*  CALCIUM 8.2* 8.3* 8.5*  PHOS  5.9* 5.9* 6.7*   Liver Function Tests:  Recent Labs Lab 04/26/15 0440 04/27/15 0500 04/28/15 0401  ALBUMIN 1.7* 1.8* 1.8*   No results for input(s): LIPASE, AMYLASE in the last 168 hours. No results for input(s): AMMONIA in the last 168 hours. CBC:  Recent Labs Lab 04/24/15 0455 04/25/15 0456 04/26/15 0440 04/27/15 0500 04/28/15 0401  WBC 15.5* 18.4* 13.1* 13.0* 15.5*  HGB 8.7* 8.7* 8.1* 7.9* 8.1*  HCT 25.6* 26.1* 25.9* 25.5* 25.6*  MCV 75.3* 76.1* 78.0 78.5 78.5  PLT 161 171 187 209 240   Cardiac Enzymes: No results for input(s): CKTOTAL, CKMB, CKMBINDEX, TROPONINI in the last 168 hours. CBG:  Recent Labs Lab 04/27/15 1741 04/27/15 2006 04/27/15 2337 04/28/15 0405 04/28/15 0731  GLUCAP 90 119* 87 81 87    Iron Studies: No results for input(s): IRON, TIBC, TRANSFERRIN, FERRITIN in the last 72 hours. Studies/Results: No results found. Medications: Infusions:    Scheduled Medications: . amiodarone  200 mg Oral Daily  . amLODipine  5 mg Oral Daily  . carvedilol  6.25 mg Oral BID WC  . darbepoetin (ARANESP) injection - DIALYSIS  150 mcg Subcutaneous Q Tue-1800  . escitalopram  10 mg Oral QHS  . feeding supplement  1 Container Oral TID  BM  . feeding supplement (PRO-STAT SUGAR FREE 64)  30 mL Oral TID  . fluticasone furoate-vilanterol  1 puff Inhalation Daily  . heparin  5,000 Units Subcutaneous 3 times per day  . lamoTRIgine  50 mg Oral Daily  . levothyroxine  125 mcg Oral QAC breakfast  . pantoprazole  40 mg Oral Daily  . piperacillin-tazobactam (ZOSYN)  IV  2.25 g Intravenous 3 times per day  . polyethylene glycol  17 g Oral Daily  . saccharomyces boulardii  250 mg Oral BID  . sodium chloride flush  3 mL Intravenous Q12H    have reviewed scheduled and prn medications.  Physical Exam: General: Eyes closed, no complaints Heart: Regular rate and rhythm Lungs: Mostly clear Abdomen: Soft, nontender Extremities: Pitting dependent edema Dialysis  Access: Right IJ HD cath placed on 4/7    04/28/2015,10:23 AM  LOS: 15 days

## 2015-04-29 LAB — RENAL FUNCTION PANEL
Albumin: 1.8 g/dL — ABNORMAL LOW (ref 3.5–5.0)
Anion gap: 13 (ref 5–15)
BUN: 44 mg/dL — ABNORMAL HIGH (ref 6–20)
CO2: 26 mmol/L (ref 22–32)
Calcium: 8.7 mg/dL — ABNORMAL LOW (ref 8.9–10.3)
Chloride: 103 mmol/L (ref 101–111)
Creatinine, Ser: 8.36 mg/dL — ABNORMAL HIGH (ref 0.61–1.24)
GFR calc Af Amer: 7 mL/min — ABNORMAL LOW
GFR calc non Af Amer: 6 mL/min — ABNORMAL LOW
Glucose, Bld: 76 mg/dL (ref 65–99)
Phosphorus: 7.9 mg/dL — ABNORMAL HIGH (ref 2.5–4.6)
Potassium: 3.3 mmol/L — ABNORMAL LOW (ref 3.5–5.1)
Sodium: 142 mmol/L (ref 135–145)

## 2015-04-29 LAB — CBC
HCT: 27 % — ABNORMAL LOW (ref 39.0–52.0)
Hemoglobin: 8.3 g/dL — ABNORMAL LOW (ref 13.0–17.0)
MCH: 24.4 pg — ABNORMAL LOW (ref 26.0–34.0)
MCHC: 30.7 g/dL (ref 30.0–36.0)
MCV: 79.4 fL (ref 78.0–100.0)
Platelets: 263 K/uL (ref 150–400)
RBC: 3.4 MIL/uL — ABNORMAL LOW (ref 4.22–5.81)
RDW: 26.6 % — ABNORMAL HIGH (ref 11.5–15.5)
WBC: 17.6 K/uL — ABNORMAL HIGH (ref 4.0–10.5)

## 2015-04-29 LAB — GLUCOSE, CAPILLARY
GLUCOSE-CAPILLARY: 74 mg/dL (ref 65–99)
Glucose-Capillary: 108 mg/dL — ABNORMAL HIGH (ref 65–99)
Glucose-Capillary: 69 mg/dL (ref 65–99)
Glucose-Capillary: 71 mg/dL (ref 65–99)
Glucose-Capillary: 79 mg/dL (ref 65–99)
Glucose-Capillary: 81 mg/dL (ref 65–99)
Glucose-Capillary: 91 mg/dL (ref 65–99)

## 2015-04-29 NOTE — Progress Notes (Signed)
Pt refusing all medications including heparin injection.  Adamantly states, "I'm not taking them." Educated pt on importance of taking meds. Continues to refuse. Will continue to monitor.

## 2015-04-29 NOTE — Progress Notes (Signed)
Subjective:  775  of UOP recorded   last 24 hours-  2 liters of stool output ??? creatinine rising off of HD.  Refused HD yesterday because it was too late- refusing other care as well.   Objective Vital signs in last 24 hours: Filed Vitals:   04/28/15 1719 04/29/15 0448 04/29/15 0855 04/29/15 1000  BP: 145/49 136/63  161/49  Pulse: 61 60  73  Temp: 98 F (36.7 C) 98.2 F (36.8 C)  97.8 F (36.6 C)  TempSrc: Oral Oral  Oral  Resp: 18 19  18   Height:      Weight:      SpO2: 98% 99% 98% 98%   Weight change:   Intake/Output Summary (Last 24 hours) at 04/29/15 1014 Last data filed at 04/29/15 0900  Gross per 24 hour  Intake      0 ml  Output   2875 ml  Net  -2875 ml    Assessment/ Plan: Pt is a 70 y.o. yo male who was admitted on 04/13/2015 with foot ulcer [name]/osteo- developed AKI in setting of ATN/supratherapeutic vancomycin levels Assessment/Plan: 1. Renal- baseline renal function normal.  Developed AKI the setting of foot ulcer/ostial and supratherapeutic vancomycin levels. He is now nonoliguric. He is not a candidate for long-term dialysis support, however, during the course of this hospitalization doing dialysis PRN. Had HD on 4/7 , 4/8 and 4/11- now watch and do PRN - was going to do HD on Friday and Saturday but pt refused.  No need to do on holiday Sunday where someone would need to be called in to do.  Will attempt again tomorrow - if refuses will consider it not important to the patient and consideration should be made to convert to comfort cate 2. Foot ulcer/osteo- medical management for now. However, in conversations with palliative care patient has consented to amputation if absolutely necessary- certainly not getting any better 3. Anemia- status post Feraheme as well as transfusions- most recent transfusion on 4/8. Have added ESA- last hgb 8.3 4. HTN/volume- is nonoliguric . On Norvasc, Coreg - BP adequate- minimal UF as am hoping renal function will recover 5. Hypokalemia-   4 K bath with HD as well as PRN repletion        Demya Scruggs A    Labs: Basic Metabolic Panel:  Recent Labs Lab 04/27/15 0500 04/28/15 0401 04/29/15 0418  NA 140 141 142  K 3.5 3.2* 3.3*  CL 101 102 103  CO2 25 25 26   GLUCOSE 101* 83 76  BUN 36* 40* 44*  CREATININE 6.40* 7.53* 8.36*  CALCIUM 8.3* 8.5* 8.7*  PHOS 5.9* 6.7* 7.9*   Liver Function Tests:  Recent Labs Lab 04/27/15 0500 04/28/15 0401 04/29/15 0418  ALBUMIN 1.8* 1.8* 1.8*   No results for input(s): LIPASE, AMYLASE in the last 168 hours. No results for input(s): AMMONIA in the last 168 hours. CBC:  Recent Labs Lab 04/25/15 0456 04/26/15 0440 04/27/15 0500 04/28/15 0401 04/29/15 0418  WBC 18.4* 13.1* 13.0* 15.5* 17.6*  HGB 8.7* 8.1* 7.9* 8.1* 8.3*  HCT 26.1* 25.9* 25.5* 25.6* 27.0*  MCV 76.1* 78.0 78.5 78.5 79.4  PLT 171 187 209 240 263   Cardiac Enzymes: No results for input(s): CKTOTAL, CKMB, CKMBINDEX, TROPONINI in the last 168 hours. CBG:  Recent Labs Lab 04/28/15 1637 04/28/15 2052 04/29/15 0024 04/29/15 0446 04/29/15 0745  GLUCAP 79 78 81 71 74    Iron Studies: No results for input(s): IRON, TIBC, TRANSFERRIN, FERRITIN in the  last 72 hours. Studies/Results: No results found. Medications: Infusions:    Scheduled Medications: . amiodarone  200 mg Oral Daily  . amLODipine  5 mg Oral Daily  . carvedilol  6.25 mg Oral BID WC  . darbepoetin (ARANESP) injection - DIALYSIS  150 mcg Subcutaneous Q Tue-1800  . escitalopram  10 mg Oral QHS  . feeding supplement  1 Container Oral TID BM  . feeding supplement (PRO-STAT SUGAR FREE 64)  30 mL Oral TID  . fluticasone furoate-vilanterol  1 puff Inhalation Daily  . heparin  5,000 Units Subcutaneous 3 times per day  . lamoTRIgine  50 mg Oral Daily  . levothyroxine  125 mcg Oral QAC breakfast  . pantoprazole  40 mg Oral Daily  . piperacillin-tazobactam (ZOSYN)  IV  2.25 g Intravenous 3 times per day  . polyethylene glycol  17 g  Oral Daily  . saccharomyces boulardii  250 mg Oral BID  . sodium chloride flush  3 mL Intravenous Q12H    have reviewed scheduled and prn medications.  Physical Exam: General: Eyes closed, no complaints Heart: Regular rate and rhythm Lungs: Mostly clear Abdomen: Soft, nontender Extremities: Pitting dependent edema Dialysis Access: Right IJ temp HD cath placed on 4/7    04/29/2015,10:14 AM  LOS: 16 days

## 2015-04-29 NOTE — Progress Notes (Signed)
This nurse with [name] Isaacs, RN spoke to the pt with his hemodialysis tonight, Pt stated " its too late , I dont want to be dialysis tonight". Despite informing pt that HD will be closed tomorrow. Nephrologist made aware and ok to moved the HD on Monday, 04/30/2015. Pt alert and oriented in no acute distress.

## 2015-04-29 NOTE — Progress Notes (Signed)
[facility] PROGRESS NOTE  Craig [name] ZOX:[id number] DOB: [birth year] DOA: 04/13/2015 PCP: Craig Boys, DO  Brief narrative 70 year old with history of peripheral vascular disease, chronic right toe ulcer, nonambulatory at baseline with poor functional status, severe protein calorie malnutrition, who was seen by a surgeon  on 03/30/2015 and  found to have frank pus draining from right second toe. Recommended for hospital admission with orthopedic evaluation but patient refused and was discharged home with plan to treat him on vancomycin and Zosyn.  Patient sent from skilled nursing facility and admitted to hospitalist service on 04/14/2015 due to acute encephalopathy likely secondary to renal failure from vancomycin toxicity. His vancomycin level on admission was 146.  X-rays of his right foot showed lytic destruction of the second proximal phalanx consistent with osteomyelitis. His creatinine on admission was 8.6, with a baseline creatinine 0.9. His creatinine continued to get worse and patient became severely uremic.  HD catheter placed and patient started on intermittent hemodialysis.    Assessment/Plan:  Suspected sepsis Could be due to foot osteomyelitis. Still has leukocytosis however remains afebrile. Blood cultures have been negative. Urine culture + pseudomonas On empiric Zosyn , recommended at least until 4/28 (6 weeks course). Vancomycin discontinued on admission, level is supratherapeutic but improving. Plan to start daptomycin once level subtherapeutic.  F/u on vanc level.  Acute tubular necrosis with oliguric renal failure Suspect vancomycin-induced nephrotoxicity with hypovolemia. Renal following. Started on NG on 4/7 with temporary HD catheter placed. Has decent urine output and renal function slowly improving. He is not a candidate for long-term dialysis. Received dialysis on 4/7, 4/8 and 4/11 and prn.  Prn HD per nephrology  Acute osteomyelitis of right foot  with underlying peripheral vascular disease On empiric antibiotic. X-ray showed likely destruction of the second proximal phalanx. Dr. Lajoyce [name] recommends continued medical management and if condition worsens he will need above-knee amputation.   Acute hypoxic and hypercarbic respiratory failure Resolved. Chest x-ray unremarkable. ABG showing respiratory acidosis. VQ scan done on 4/2 showed low probably for PE.   Acute uremic/metabolic encephalopathy Resolved with hemodialysis. Continue aspiration precautions.  Intermittent hypoglycemia Likely due to poor by mouth intake. Monitor CBC closely.  A. Fib, paroxysmal  Currently in sinus rhythm, Rate controlled. Continue beta blocker and amiodarone. Not on anticoagulation. Has only been on asa for unclear reason, CHADSvasc score 3 with htn, pvd, age 42  This need to be addressed by pmd  COPD Continue albuterol nebs when necessary  Severe protein calorie malnutrition Continue supplement.  Candida UTI/pseudomonas uti On Diflucan until 4/14. Treated with zosyn.  Microcytic anemia Stable. Received PRBC on 4/2 and 4/8. Received faraheme on 4/1  Stage 1 pressure ulcer on heel and sacrum.  -allevyn dressings  Chronic colostomy and suprapubic catheter  DVT prophylaxis: Subcutaneous heparin  Diet: Dysphagia level III with supplements   Code Status: DO NOT RESUSCITATE Family Communication: None at bedside. POA is Mr. Craig [name] Disposition Plan: Eventually need skilled nursing facility. Hospital course prolonged due to ATN and ongoing vancomycin toxicity Will need ongoing goal of care discussion, patient has been refusing care,   Consultants:  Renal  Dr. Lajoyce [name]  palliative care  Procedures:  Right IJ catheter  Hemodialysis   Antibiotics: Vancomycin 3/17-4/1 (levels still supratherapeutic) IV Zosyn 3/17-4/28 (6 weeks course) Diflucan 4/7-4/14   HPI/Subjective: Seen and examined. Aaox3, but has been refusing care,  denies pain, when asked why he has colostomy and suprapubic catheter, he replies "  i donot know why"  Objective:  Filed Vitals:   04/29/15 0448 04/29/15 1000  BP: 136/63 161/49  Pulse: 60 73  Temp: 98.2 F (36.8 C) 97.8 F (36.6 C)  Resp: 19 18    Intake/Output Summary (Last 24 hours) at 04/29/15 1624 Last data filed at 04/29/15 1230  Gross per 24 hour  Intake      0 ml  Output   2925 ml  Net  -2925 ml   Filed Weights   04/24/15 2100 04/26/15 1937 04/27/15 2009  Weight: 87.8 kg (193 lb 9 oz) 84 kg (185 lb 3 oz) 85.6 kg (188 lb 11.4 oz)    Exam:   General:  Elderly male appears frail, not in distress    HEENT: Temporal wasting, Pallor present, moist mucosa, right IJ, supple neck  Chest: Clear bilaterally, no added sounds  Cardiovascular: Normal S1 and S2, no murmurs rub or gallop  GI: Soft, nondistended, nontender, colostomy bag in place, suprapubic catheter  Musculoskeletal: Dried second toe of right foot, scattered erythema, severe muscle wasting  CNS: Alert and oriented  Data Reviewed: Basic Metabolic Panel:  Recent Labs Lab 04/25/15 0456 04/26/15 0440 04/27/15 0500 04/28/15 0401 04/29/15 0418  NA 137 138 140 141 142  K 3.3* 3.7 3.5 3.2* 3.3*  CL 99* 100* 101 102 103  CO2 GLUCOSE 85 71 101* 83 76  BUN 27* 33* 36* 40* 44*  CREATININE 4.02* 5.19* 6.40* 7.53* 8.36*  CALCIUM 8.0* 8.2* 8.3* 8.5* 8.7*  PHOS 3.9 5.9* 5.9* 6.7* 7.9*   Liver Function Tests:  Recent Labs Lab 04/25/15 0456 04/26/15 0440 04/27/15 0500 04/28/15 0401 04/29/15 0418  ALBUMIN 1.9* 1.7* 1.8* 1.8* 1.8*   No results for input(s): LIPASE, AMYLASE in the last 168 hours. No results for input(s): AMMONIA in the last 168 hours. CBC:  Recent Labs Lab 04/25/15 0456 04/26/15 0440 04/27/15 0500 04/28/15 0401 04/29/15 0418  WBC 18.4* 13.1* 13.0* 15.5* 17.6*  HGB 8.7* 8.1* 7.9* 8.1* 8.3*  HCT 26.1* 25.9* 25.5* 25.6* 27.0*  MCV 76.1* 78.0 78.5 78.5 79.4  PLT  171 187 209 240 263   Cardiac Enzymes: No results for input(s): CKTOTAL, CKMB, CKMBINDEX, TROPONINI in the last 168 hours. BNP (last 3 results) No results for input(s): BNP in the last 8760 hours.  ProBNP (last 3 results) No results for input(s): PROBNP in the last 8760 hours.  CBG:  Recent Labs Lab 04/29/15 0024 04/29/15 0446 04/29/15 0745 04/29/15 1151 04/29/15 1230  GLUCAP 81 71 74 69 91    No results found for this or any previous visit (from the past 240 hour(s)).   Studies: No results found.  Scheduled Meds: . amiodarone  200 mg Oral Daily  . amLODipine  5 mg Oral Daily  . carvedilol  6.25 mg Oral BID WC  . darbepoetin (ARANESP) injection - DIALYSIS  150 mcg Subcutaneous Q Tue-1800  . escitalopram  10 mg Oral QHS  . feeding supplement  1 Container Oral TID BM  . feeding supplement (PRO-STAT SUGAR FREE 64)  30 mL Oral TID  . fluticasone furoate-vilanterol  1 puff Inhalation Daily  . heparin  5,000 Units Subcutaneous 3 times per day  . lamoTRIgine  50 mg Oral Daily  . levothyroxine  125 mcg Oral QAC breakfast  . pantoprazole  40 mg Oral Daily  . piperacillin-tazobactam (ZOSYN)  IV  2.25 g Intravenous 3 times per day  . polyethylene glycol  17 g Oral Daily  . saccharomyces boulardii  250  mg Oral BID  . sodium chloride flush  3 mL Intravenous Q12H   Continuous Infusions:     Time spent: 25 minutes    Elleanor Guyett MD PhD  [facility] Pager [phone] If 7PM-7AM, please contact night-coverage at [email or web address], password [email or web address] 04/29/2015, 4:24 PM  LOS: 16 days

## 2015-04-30 DIAGNOSIS — O0489 (Induced) termination of pregnancy with other complications: Secondary | ICD-10-CM | POA: Insufficient documentation

## 2015-04-30 LAB — RENAL FUNCTION PANEL
ANION GAP: 16 — AB (ref 5–15)
Albumin: 1.7 g/dL — ABNORMAL LOW (ref 3.5–5.0)
BUN: 47 mg/dL — ABNORMAL HIGH (ref 6–20)
CALCIUM: 8.4 mg/dL — AB (ref 8.9–10.3)
CHLORIDE: 103 mmol/L (ref 101–111)
CO2: 24 mmol/L (ref 22–32)
CREATININE: 8.95 mg/dL — AB (ref 0.61–1.24)
GFR calc Af Amer: 6 mL/min — ABNORMAL LOW (ref >60)
GFR calc non Af Amer: 5 mL/min — ABNORMAL LOW (ref >60)
GLUCOSE: 80 mg/dL (ref 65–99)
Phosphorus: 7.2 mg/dL — ABNORMAL HIGH (ref 2.5–4.6)
Potassium: 2.9 mmol/L — ABNORMAL LOW (ref 3.5–5.1)
SODIUM: 143 mmol/L (ref 135–145)

## 2015-04-30 LAB — CBC
HCT: 26 % — ABNORMAL LOW (ref 39.0–52.0)
HEMOGLOBIN: 8.2 g/dL — AB (ref 13.0–17.0)
MCH: 24.8 pg — AB (ref 26.0–34.0)
MCHC: 31.5 g/dL (ref 30.0–36.0)
MCV: 78.5 fL (ref 78.0–100.0)
Platelets: 253 10*3/uL (ref 150–400)
RBC: 3.31 MIL/uL — AB (ref 4.22–5.81)
RDW: 26.6 % — ABNORMAL HIGH (ref 11.5–15.5)
WBC: 18.9 10*3/uL — ABNORMAL HIGH (ref 4.0–10.5)

## 2015-04-30 LAB — GLUCOSE, CAPILLARY
GLUCOSE-CAPILLARY: 78 mg/dL (ref 65–99)
GLUCOSE-CAPILLARY: 81 mg/dL (ref 65–99)
Glucose-Capillary: 92 mg/dL (ref 65–99)
Glucose-Capillary: 62 mg/dL — ABNORMAL LOW (ref 65–99)
Glucose-Capillary: 96 mg/dL (ref 65–99)

## 2015-04-30 LAB — VANCOMYCIN, RANDOM: VANCOMYCIN RM: 62 ug/mL — AB

## 2015-04-30 MED ORDER — HEPARIN SODIUM (PORCINE) 1000 UNIT/ML DIALYSIS: 20.0000 [IU]/kg | INTRAMUSCULAR | Status: DC | PRN

## 2015-04-30 NOTE — Progress Notes (Signed)
Late Entry  Patient returned from HD. He refused all his scheduled medications upon return. Allowed RN to complete basic assessment, but would not allow a thorough skin assessment. Per report received from night shift, patient has a stage 2 pressure ulcer on his buttocks/sacrum, but patient declined to have this assessed at this time. Will continue to attempt to assess.  Leanna BattlesEckelmann, Kieran Arreguin Eileen, RN.

## 2015-04-30 NOTE — Progress Notes (Addendum)
[facility] PROGRESS NOTE  Craig GuerinWilliam [name] ZOX:[id number] DOB: [birth year] DOA: 04/13/2015 PCP: Kirt Boysarter, Monica, DO  Brief narrative 70 year old with history of peripheral vascular disease, chronic right toe ulcer, nonambulatory at baseline with poor functional status, severe protein calorie malnutrition, who was seen by a surgeon  on 03/30/2015 and  found to have frank pus draining from right second toe. Recommended for hospital admission with orthopedic evaluation but patient refused and was discharged home with plan to treat him on vancomycin and Zosyn.admitted on 04/13/2015 with foot ulcer [name]/osteo- developed AKI in setting of ATN  Patient sent from skilled nursing facility and admitted to hospitalist service on 04/14/2015 due to acute encephalopathy likely secondary to renal failure from vancomycin toxicity. His vancomycin level on admission was 146.  X-rays of his right foot showed lytic destruction of the second proximal phalanx consistent with osteomyelitis. His creatinine on admission was 8.6, with a baseline creatinine 0.9. His creatinine continued to get worse and patient became severely uremic.  HD catheter placed and patient started on intermittent hemodialysis.    Assessment/Plan:  Suspected sepsis Could be due to foot osteomyelitis. Still has leukocytosis however remains afebrile. Blood cultures have been negative. Urine culture + pseudomonas On empiric Zosyn , recommended at least until 4/28 (6 weeks course). Vancomycin discontinued on admission, level is still  supratherapeutic 62 . Plan to start daptomycin once level subtherapeutic.  F/u on vanc level with dialysis.   Acute tubular necrosis with oliguric renal failure Suspect vancomycin-induced nephrotoxicity with hypovolemia. Renal following. Had HD on 4/7 , 4/8 and 4/11- now watch and do PRN - was going to do HD on Friday and Saturday but pt refused. Has decent urine output and renal function slowly improving. He is not a  candidate for long-term dialysis. Prn HD per nephrology, if refuses convert to comfort care  Acute osteomyelitis of right foot with underlying peripheral vascular disease On empiric antibiotic. X-ray showed likely destruction of the second proximal phalanx. Dr. Lajoyce Cornersuda recommends continued medical management and if condition worsens he will need above-knee amputation. Will request Dr. Ophelia CharterYates to evaluate for possible amputation, he said he will notify Dr Lajoyce Cornersuda    Acute hypoxic and hypercarbic respiratory failure Resolved. Chest x-ray unremarkable. ABG showing respiratory acidosis. VQ scan done on 4/2 showed low probably for PE.   Acute uremic/metabolic encephalopathy Resolved with hemodialysis. Continue aspiration precautions.  Intermittent hypoglycemia Likely due to poor by mouth intake. Monitor CBC closely.  A. Fib, paroxysmal  Currently in sinus rhythm, Rate controlled. Continue beta blocker and amiodarone. Not on anticoagulation. Has only been on asa for unclear reason, CHADSvasc score 3 with htn, pvd, age 70  This need to be addressed by pmd  COPD Continue albuterol nebs when necessary  Severe protein calorie malnutrition Continue supplement.  Candida UTI/pseudomonas uti On Diflucan until 4/14. Treated with zosyn.  Microcytic anemia Stable. Received PRBC on 4/2 and 4/8. Received faraheme on 4/1  Stage 1 pressure ulcer on heel and sacrum.  -allevyn dressings  Chronic colostomy and suprapubic catheter  DVT prophylaxis: Subcutaneous heparin  Diet: Dysphagia level III with supplements   Code Status: DO NOT RESUSCITATE Family Communication: None at bedside. POA is Mr. Craig BrunswickWilliam [name] Disposition Plan: Eventually need skilled nursing facility. Hospital course prolonged due to ATN and ongoing vancomycin toxicity Will need ongoing goal of care discussion, patient has been refusing care,   Consultants:  Renal  Dr. Lajoyce Cornersuda  palliative care  Procedures:  Right IJ  catheter  Hemodialysis   Antibiotics:  Vancomycin 3/17-4/1 (levels still supratherapeutic) IV Zosyn 3/17-4/28 (6 weeks course) Diflucan 4/7-4/14   HPI/Subjective: Seen and examined. Aaox3, but has been refusing care, no problems with colostomy and suprapubic catheter, patient agreeable to proceed with amputation if needed today  Objective: Filed Vitals:   04/30/15 0720 04/30/15 0800  BP: 156/58 154/67  Pulse: 73 80  Temp:    Resp: 15 14    Intake/Output Summary (Last 24 hours) at 04/30/15 0827 Last data filed at 04/30/15 1610  Gross per 24 hour  Intake    340 ml  Output   2250 ml  Net  -1910 ml   Filed Weights   04/26/15 1937 04/27/15 2009 04/29/15 2131  Weight: 84 kg (185 lb 3 oz) 85.6 kg (188 lb 11.4 oz) 84 kg (185 lb 3 oz)    Exam:   General:  Elderly male appears frail, not in distress    HEENT: Temporal wasting, Pallor present, moist mucosa, right IJ, supple neck  Chest: Clear bilaterally, no added sounds  Cardiovascular: Normal S1 and S2, no murmurs rub or gallop  GI: Soft, nondistended, nontender, colostomy bag in place, suprapubic catheter  Musculoskeletal: Dried second toe of right foot, scattered erythema, severe muscle wasting  CNS: Alert and oriented  Data Reviewed: Basic Metabolic Panel:  Recent Labs Lab 04/26/15 0440 04/27/15 0500 04/28/15 0401 04/29/15 0418 04/30/15 0504  NA 138 140 141 142 143  K 3.7 3.5 3.2* 3.3* 2.9*  CL 100* 101 102 103 103  CO2 GLUCOSE 71 101* 83 76 80  BUN 33* 36* 40* 44* 47*  CREATININE 5.19* 6.40* 7.53* 8.36* 8.95*  CALCIUM 8.2* 8.3* 8.5* 8.7* 8.4*  PHOS 5.9* 5.9* 6.7* 7.9* 7.2*   Liver Function Tests:  Recent Labs Lab 04/26/15 0440 04/27/15 0500 04/28/15 0401 04/29/15 0418 04/30/15 0504  ALBUMIN 1.7* 1.8* 1.8* 1.8* 1.7*   No results for input(s): LIPASE, AMYLASE in the last 168 hours. No results for input(s): AMMONIA in the last 168 hours. CBC:  Recent Labs Lab  04/26/15 0440 04/27/15 0500 04/28/15 0401 04/29/15 0418 04/30/15 0504  WBC 13.1* 13.0* 15.5* 17.6* 18.9*  HGB 8.1* 7.9* 8.1* 8.3* 8.2*  HCT 25.9* 25.5* 25.6* 27.0* 26.0*  MCV 78.0 78.5 78.5 79.4 78.5  PLT 187 209 240 263 253   Cardiac Enzymes: No results for input(s): CKTOTAL, CKMB, CKMBINDEX, TROPONINI in the last 168 hours. BNP (last 3 results) No results for input(s): BNP in the last 8760 hours.  ProBNP (last 3 results) No results for input(s): PROBNP in the last 8760 hours.  CBG:  Recent Labs Lab 04/29/15 1151 04/29/15 1230 04/29/15 1551 04/29/15 2132 04/30/15 0437  GLUCAP 69 91 108* 79 92    No results found for this or any previous visit (from the past 240 hour(s)).   Studies: No results found.  Scheduled Meds: . amiodarone  200 mg Oral Daily  . amLODipine  5 mg Oral Daily  . carvedilol  6.25 mg Oral BID WC  . darbepoetin (ARANESP) injection - DIALYSIS  150 mcg Subcutaneous Q Tue-1800  . escitalopram  10 mg Oral QHS  . feeding supplement  1 Container Oral TID BM  . feeding supplement (PRO-STAT SUGAR FREE 64)  30 mL Oral TID  . fluticasone furoate-vilanterol  1 puff Inhalation Daily  . heparin  5,000 Units Subcutaneous 3 times per day  . lamoTRIgine  50 mg Oral Daily  . levothyroxine  125 mcg Oral QAC breakfast  .  pantoprazole  40 mg Oral Daily  . piperacillin-tazobactam (ZOSYN)  IV  2.25 g Intravenous 3 times per day  . polyethylene glycol  17 g Oral Daily  . saccharomyces boulardii  250 mg Oral BID  . sodium chloride flush  3 mL Intravenous Q12H   Continuous Infusions:     Time spent: 25 minutes    Zoraida Havrilla MD  [facility] Pager [phone] If 7PM-7AM, please contact night-coverage at [email or web address], password [facility] 04/30/2015, 8:27 AM  LOS: 17 days

## 2015-04-30 NOTE — Procedures (Signed)
I have seen and examined this patient and agree with the plan of care. Patient seen on dialysis with no complaints  Naimah Yingst W 04/30/2015, 10:16 AM

## 2015-04-30 NOTE — Progress Notes (Signed)
Subjective:  1.75 L UOP recorded in last 24 hours (increased from yesterday). Refused HD on 4/15. Currently in HD and tolerating well. Still has some diarrhea but no N/V or abdominal pain.   Objective Vital signs in last 24 hours: Filed Vitals:   04/30/15 0705 04/30/15 0715 04/30/15 0720 04/30/15 0800  BP: 154/61 152/60 156/58 154/67  Pulse: 76 74 73 80  Temp: 98.4 F (36.9 C)     TempSrc: Oral     Resp: 18 16 15 14   Height:      Weight:      SpO2: 100% 99% 99% 99%   Weight change:   Intake/Output Summary (Last 24 hours) at 04/30/15 0943 Last data filed at 04/30/15 [id number]  Gross per 24 hour  Intake    340 ml  Output   2150 ml  Net  -1810 ml    Assessment/ Plan: Pt is a 70 y.o. yo male who was admitted on 04/13/2015 with foot ulcer [name]/osteo- developed AKI in setting of ATN/supratherapeutic vancomycin levels Assessment/Plan: 1. Renal- baseline renal function normal.  Developed AKI the setting of foot ulcer/osteo and supratherapeutic vancomycin levels. He continues to be nonoliguric as seen by UOP. Not an appropriate candidate for long-term dialysis support. Currently undergoing dialysis PRN during this hospitalization. Had HD on 4/7 , 4/8 and 4/11- refused on 4/15 due to how late it was. Plan continues as HD PRN. If refusal of HD persists in future, consider discussing goals of care.  2. Foot ulcer/osteo- medical management for now. However, in conversations with palliative care patient has consented to amputation (likely AKA) if necessary - manage per primary team 3. Anemia- status post Feraheme as well as transfusions- most recent transfusion on 4/8. Have added ESA- last hgb 8.2; will watch 4. HTN/volume- is nonoliguric . On Norvasc, Coreg - BP adequate- minimal UF as am hoping renal function will recover 5. Hypokalemia-  4 K bath with HD as well as PRN repletion        Craig [name]    Labs: Basic Metabolic Panel:  Recent Labs Lab 04/28/15 0401 04/29/15 0418 04/30/15 0504  NA  141 142 143  K 3.2* 3.3* 2.9*  CL 102 103 103  CO2 25 26 24   GLUCOSE 83 76 80  BUN 40* 44* 47*  CREATININE 7.53* 8.36* 8.95*  CALCIUM 8.5* 8.7* 8.4*  PHOS 6.7* 7.9* 7.2*   Liver Function Tests:  Recent Labs Lab 04/28/15 0401 04/29/15 0418 04/30/15 0504  ALBUMIN 1.8* 1.8* 1.7*   No results for input(s): LIPASE, AMYLASE in the last 168 hours. No results for input(s): AMMONIA in the last 168 hours. CBC:  Recent Labs Lab 04/26/15 0440 04/27/15 0500 04/28/15 0401 04/29/15 0418 04/30/15 0504  WBC 13.1* 13.0* 15.5* 17.6* 18.9*  HGB 8.1* 7.9* 8.1* 8.3* 8.2*  HCT 25.9* 25.5* 25.6* 27.0* 26.0*  MCV 78.0 78.5 78.5 79.4 78.5  PLT 187 209 240 263 253   Cardiac Enzymes: No results for input(s): CKTOTAL, CKMB, CKMBINDEX, TROPONINI in the last 168 hours. CBG:  Recent Labs Lab 04/29/15 1151 04/29/15 1230 04/29/15 1551 04/29/15 2132 04/30/15 0437  GLUCAP 69 91 108* 79 92    Iron Studies: No results for input(s): IRON, TIBC, TRANSFERRIN, FERRITIN in the last 72 hours. Studies/Results: No results found. Medications: Infusions:    Scheduled Medications: . amiodarone  200 mg Oral Daily  . amLODipine  5 mg Oral Daily  . carvedilol  6.25 mg Oral BID WC  . darbepoetin (ARANESP) injection -  DIALYSIS  150 mcg Subcutaneous Q Tue-1800  . escitalopram  10 mg Oral QHS  . feeding supplement  1 Container Oral TID BM  . feeding supplement (PRO-STAT SUGAR FREE 64)  30 mL Oral TID  . fluticasone furoate-vilanterol  1 puff Inhalation Daily  . heparin  5,000 Units Subcutaneous 3 times per day  . lamoTRIgine  50 mg Oral Daily  . levothyroxine  125 mcg Oral QAC breakfast  . pantoprazole  40 mg Oral Daily  . piperacillin-tazobactam (ZOSYN)  IV  2.25 g Intravenous 3 times per day  . polyethylene glycol  17 g Oral Daily  . saccharomyces boulardii  250 mg Oral BID  . sodium chloride flush  3 mL Intravenous Q12H    have reviewed scheduled and prn medications.  Physical Exam: General:  Laying in bed during HD. NAD Heart: Regular rate and rhythm. Distant heart sounds Lungs: Mostly clear, no increased WOB Abdomen: Soft, nontender Extremities: +2 Pitting edema Dialysis Access: Right IJ temp HD cath placed on 4/7    04/30/2015,9:43 AM  LOS: 17 days

## 2015-04-30 NOTE — Progress Notes (Signed)
Pharmacy Antibiotic Note  [name] is a 70 y.o. male admitted on 04/13/2015 with osteomyeltitis noted to be on Vancomycin + Zosyn PTA and found to have AKI on admission. Pharmacy has been consulted for Daptomycin (once Vancomycin levels are <15 mcg/ml) and Zosyn dosing.  The patient's Vancomycin random this morning remains SUPRAtherapeutic at 6162mcg/mL, though trending down.The patient has increased UOP, however does not appear to be filtering since SCr rising between HD sessions. It is unlikely there is much contribution to vancomycin clearance outside of HD sessions. The patient is receiving I-HD, currently there now- had been refusing sessions over the weekend. Estimate it would take 3 FULL (4hr with BFR 43600mL/min) HD sessions to have vancomycin level <215mcg/mL- unlikely to happen as patient is new to HD.  The patient also continues on Zosyn and these doses remain appropriate at this time for the patient's renal function.   Plan: - would plan to get another VR 4/20 or after - Hold start of Daptomycin due to elevated Vancomycin levels - Continue Zosyn 2.25g/8h - Will continue to follow renal function/UOP, HD plans/tolerance - Follow for LOT- original plan per notes is 6 week course with stop date of 4/28  Height: 6\' 3"  (190.5 cm) Weight: 185 lb 3 oz (84 kg) IBW/kg (Calculated) : 84.5  Temp (24hrs), Avg:98.1 F (36.7 C), Min:97.8 F (36.6 C), Max:98.4 F (36.9 C)   Recent Labs Lab 04/26/15 0440 04/27/15 0500 04/28/15 0401 04/29/15 0418 04/30/15 0504  WBC 13.1* 13.0* 15.5* 17.6* 18.9*  CREATININE 5.19* 6.40* 7.53* 8.36* 8.95*  VANCORANDOM 71*  --   --   --  62*    Estimated Creatinine Clearance: 9.4 mL/min (by C-G formula based on Cr of 8.95).    No Known Allergies  Antimicrobials this admission: Vanc PTA >> 4/1 [intended to stop 4/28] * 4/1 VR: 147 mcg/ml * 4/3 VR: 146 mcg/ml * 4/5 VR: 115 mcg/ml * 4/8 VR: 114 mcg/ml * 4/11 VR: 83 mcg/ml * 4/13 VR: 71 mcg/ml *  4/17 VR: 2862mcg/ml Zosyn PTA >> [intended to stop 4/28] Dapto (not yet started) Fluconazole 4/4 >> 4/14  Dose adjustments this admission:  Microbiology results: 4/1 MRSA PCR >> positive 4/1 UCx >> 100k yeast + 40k PSA (pan sens) 4/1 BCx >> neg  Thank you for allowing pharmacy to be a part of this patient's care.  Kayly Kriegel D. Amond Speranza, PharmD, BCPS Clinical Pharmacist Pager: [phone] 04/30/2015 8:55 AM

## 2015-05-01 DIAGNOSIS — M86171 Other acute osteomyelitis, right ankle and foot: Secondary | ICD-10-CM | POA: Insufficient documentation

## 2015-05-01 LAB — COMPREHENSIVE METABOLIC PANEL
ALBUMIN: 1.7 g/dL — AB (ref 3.5–5.0)
ALK PHOS: 46 U/L (ref 38–126)
ALT: 15 U/L — AB (ref 17–63)
ANION GAP: 14 (ref 5–15)
AST: 14 U/L — ABNORMAL LOW (ref 15–41)
BILIRUBIN TOTAL: 0.6 mg/dL (ref 0.3–1.2)
BUN: 23 mg/dL — AB (ref 6–20)
CALCIUM: 8.5 mg/dL — AB (ref 8.9–10.3)
CO2: 25 mmol/L (ref 22–32)
CREATININE: 5.61 mg/dL — AB (ref 0.61–1.24)
Chloride: 103 mmol/L (ref 101–111)
GFR calc Af Amer: 11 mL/min — ABNORMAL LOW (ref >60)
GFR calc non Af Amer: 9 mL/min — ABNORMAL LOW (ref >60)
GLUCOSE: 86 mg/dL (ref 65–99)
Potassium: 2.7 mmol/L — CL (ref 3.5–5.1)
SODIUM: 142 mmol/L (ref 135–145)
TOTAL PROTEIN: 5.9 g/dL — AB (ref 6.5–8.1)

## 2015-05-01 LAB — GLUCOSE, CAPILLARY
GLUCOSE-CAPILLARY: 105 mg/dL — AB (ref 65–99)
GLUCOSE-CAPILLARY: 69 mg/dL (ref 65–99)
Glucose-Capillary: 79 mg/dL (ref 65–99)
Glucose-Capillary: 66 mg/dL (ref 65–99)
Glucose-Capillary: 83 mg/dL (ref 65–99)
Glucose-Capillary: 72 mg/dL (ref 65–99)
Glucose-Capillary: 94 mg/dL (ref 65–99)

## 2015-05-01 LAB — CBC
HEMATOCRIT: 26.1 % — AB (ref 39.0–52.0)
HEMOGLOBIN: 8.3 g/dL — AB (ref 13.0–17.0)
MCH: 25.2 pg — ABNORMAL LOW (ref 26.0–34.0)
MCHC: 31.8 g/dL (ref 30.0–36.0)
MCV: 79.1 fL (ref 78.0–100.0)
Platelets: 224 10*3/uL (ref 150–400)
RBC: 3.3 MIL/uL — AB (ref 4.22–5.81)
RDW: 26.8 % — ABNORMAL HIGH (ref 11.5–15.5)
WBC: 14.9 10*3/uL — ABNORMAL HIGH (ref 4.0–10.5)

## 2015-05-01 MED ORDER — POTASSIUM CHLORIDE 10 MEQ/100ML IV SOLN
10.0000 meq | INTRAVENOUS | Status: AC
Start: 2015-05-01 — End: 2015-05-01
  Administered 2015-05-01 (×3): 10 meq via INTRAVENOUS
  Filled 2015-05-01 (×3): qty 100

## 2015-05-01 MED ORDER — POTASSIUM CHLORIDE 10 MEQ/100ML IV SOLN
10.0000 meq | Freq: Once | INTRAVENOUS | Status: AC
  Administered 2015-05-01: 10 meq via INTRAVENOUS
  Filled 2015-05-01: qty 100

## 2015-05-01 MED ORDER — POTASSIUM CHLORIDE CRYS ER 20 MEQ PO TBCR
40.0000 meq | EXTENDED_RELEASE_TABLET | Freq: Two times a day (BID) | ORAL | Status: DC
  Administered 2015-05-01 – 2015-05-02 (×2): 40 meq via ORAL
  Filled 2015-05-01 (×3): qty 2

## 2015-05-01 NOTE — Progress Notes (Signed)
Palliative Medicine RN Note: Per ortho note, expect surgery Friday. RN today notes that pt is refusing assessment and medications. Spoke w POA Craig [name] [phone]; he can be here Thursday at 0900 to meet w Dr Phillips OdorGolding to discuss goals/options.  Donn PieriniMelanie G. Oliver, RN, BSN, [facility] 05/01/2015 2:33 PM Cell [phone] 8:00-4:00 Monday-Friday Office [phone]

## 2015-05-01 NOTE — Progress Notes (Signed)
[facility] PROGRESS NOTE  Craig [name] ZOX:[id number] DOB: 06/29/1945 DOA: 04/13/2015 PCP: Kirt Boys, DO  Brief narrative 70 year old with history of peripheral vascular disease, chronic right toe ulcer, nonambulatory at baseline with poor functional status, severe protein calorie malnutrition, who was seen by a surgeon  on 03/30/2015 and  found to have frank pus draining from right second toe. Recommended for hospital admission with orthopedic evaluation but patient refused and was discharged home with plan to treat him on vancomycin and Zosyn.admitted on 04/13/2015 with foot ulcer [name]- developed AKI in setting of ATN  Patient sent from skilled nursing facility and admitted to hospitalist service on 04/14/2015 due to acute encephalopathy likely secondary to renal failure from vancomycin toxicity. His vancomycin level on admission was 146.  X-rays of his right foot showed lytic destruction of the second proximal phalanx consistent with osteomyelitis. His creatinine on admission was 8.6, with a baseline creatinine 0.9. His creatinine continued to get worse and patient became severely uremic.  HD catheter placed and patient started on intermittent hemodialysis.    Assessment/Plan:   sepsis  secondary to Pseudomonas UTI, right, foot osteomyelitis  elitis. Still has leukocytosis however remains afebrile. Blood cultures have been negative. Urine culture + pseudomonas On empiric Zosyn , recommended at least until 4/28 (6 weeks course). Vancomycin discontinued on admission, level is still  supratherapeutic 62 . Plan to start daptomycin once level subtherapeutic.  F/u on vanc level with dialysis. Patient may not need daptomycin If he were to undergo surgery. Further recommendations from Dr. Lajoyce Corners pending    Acute tubular necrosis with oliguric renal failure Suspect vancomycin-induced nephrotoxicity with hypovolemia. Renal following. Had HD on 4/7 , 4/8 and 4/11- now watch and do PRN -  was going to do HD on Friday and Saturday but pt refused. Has decent urine output and renal function slowly improving. He is not a candidate for long-term dialysis. Prn HD per nephrology, if refuses convert to comfort care.  Patient continues to change his mind about what he wants  Acute osteomyelitis of right foot with underlying peripheral vascular disease On empiric antibiotic. X-ray showed likely destruction of the second proximal phalanx. Dr. Lajoyce Corners recommends continued medical management and if condition worsens he will need above-knee amputation.Requested  Dr. Ophelia Charter to evaluate for possible amputation,patient to be evaluated by Dr. Lajoyce Corners today to make a final decision   Acute hypoxic and hypercarbic respiratory failure Resolved. Chest x-ray unremarkable. ABG showing respiratory acidosis. VQ scan done on 4/2 showed low probably for PE.   Acute uremic/metabolic encephalopathy Resolved with hemodialysis. Continue aspiration precautions.  Intermittent hypoglycemia Likely due to poor by mouth intake. Monitor CBC closely.  A. Fib, paroxysmal  Currently in sinus rhythm, Rate controlled. Continue beta blocker and amiodarone. Not on anticoagulation. Has only been on asa for unclear reason, CHADSvasc score 3 with htn, pvd, age 9     COPD Continue albuterol nebs when necessary  Severe protein calorie malnutrition Continue supplement.  Candida UTI/pseudomonas uti On Diflucan until 4/14. Treated with zosyn.  Microcytic anemia Stable. Received PRBC on 4/2 and 4/8. Received faraheme on 4/1  Stage 1 pressure ulcer on heel and sacrum.  -allevyn dressings  Chronic colostomy and suprapubic catheter  DVT prophylaxis: Subcutaneous heparin  Diet: Dysphagia level III with supplements   Code Status: DO NOT RESUSCITATE Family Communication: None at bedside. POA is Craig [name] Disposition Plan: Eventually need skilled nursing facility. Hospital course prolonged due to ATN and ongoing  vancomycin toxicity Patient had palliative  care consultation on 4/2 , at which time he expressed to move forward with amputation of the right toes if needed   Consultants:  Renal  Dr. Lajoyce Cornersuda  palliative care  Procedures:  Right IJ catheter  Hemodialysis   Antibiotics: Vancomycin 3/17-4/1 (levels still supratherapeutic) IV Zosyn 3/17-4/28 (6 weeks course) Diflucan 4/7-4/14   HPI/Subjective: Seen and examined.Patient refuses to be examined today, had  covered under the sheets   Objective: Filed Vitals:   05/01/15 0407 05/01/15 1000  BP: 162/50 165/56  Pulse: 72 70  Temp: 98.3 F (36.8 C) 98.6 F (37 C)  Resp: 20 18    Intake/Output Summary (Last 24 hours) at 05/01/15 1027 Last data filed at 05/01/15 0900  Gross per 24 hour  Intake    820 ml  Output   1750 ml  Net   -930 ml   Filed Weights   04/29/15 2131 04/30/15 0705 04/30/15 1115  Weight: 84 kg (185 lb 3 oz) 84 kg (185 lb 3 oz) 83 kg (182 lb 15.7 oz)    Exam:   General:  Elderly male appears frail, not in distress    HEENT: Temporal wasting, Pallor present, moist mucosa, right IJ, [name] neck  Chest: Clear bilaterally, no added sounds  Cardiovascular: Normal S1 and S2, no murmurs rub or gallop  GI: Soft, nondistended, nontender, colostomy bag in place, suprapubic catheter  Musculoskeletal: Dried second toe of right foot, scattered erythema, severe muscle wasting  CNS: Alert and oriented  Data Reviewed: Basic Metabolic Panel:  Recent Labs Lab 04/26/15 0440 04/27/15 0500 04/28/15 0401 04/29/15 0418 04/30/15 0504 05/01/15 0424  NA 138 140 141 142 143 142  K 3.7 3.5 3.2* 3.3* 2.9* 2.7*  CL 100* 101 102 103 103 103  CO2 26 25 25 26 24 25   GLUCOSE 71 101* 83 76 80 86  BUN 33* 36* 40* 44* 47* 23*  CREATININE 5.19* 6.40* 7.53* 8.36* 8.95* 5.61*  CALCIUM 8.2* 8.3* 8.5* 8.7* 8.4* 8.5*  PHOS 5.9* 5.9* 6.7* 7.9* 7.2*  --    Liver Function Tests:  Recent Labs Lab 04/27/15 0500  04/28/15 0401 04/29/15 0418 04/30/15 0504 05/01/15 0424  AST  --   --   --   --  14*  ALT  --   --   --   --  15*  ALKPHOS  --   --   --   --  46  BILITOT  --   --   --   --  0.6  PROT  --   --   --   --  5.9*  ALBUMIN 1.8* 1.8* 1.8* 1.7* 1.7*   No results for input(s): LIPASE, AMYLASE in the last 168 hours. No results for input(s): AMMONIA in the last 168 hours. CBC:  Recent Labs Lab 04/27/15 0500 04/28/15 0401 04/29/15 0418 04/30/15 0504 05/01/15 0424  WBC 13.0* 15.5* 17.6* 18.9* 14.9*  HGB 7.9* 8.1* 8.3* 8.2* 8.3*  HCT 25.5* 25.6* 27.0* 26.0* 26.1*  MCV 78.5 78.5 79.4 78.5 79.1  PLT 209 240 263 253 224   Cardiac Enzymes: No results for input(s): CKTOTAL, CKMB, CKMBINDEX, TROPONINI in the last 168 hours. BNP (last 3 results) No results for input(s): BNP in the last 8760 hours.  ProBNP (last 3 results) No results for input(s): PROBNP in the last 8760 hours.  CBG:  Recent Labs Lab 04/30/15 2007 04/30/15 2357 05/01/15 0403 05/01/15 0507 05/01/15 0804  GLUCAP 96 79 66 105* 83    No results  found for this or any previous visit (from the past 240 hour(s)).   Studies: No results found.  Scheduled Meds: . amiodarone  200 mg Oral Daily  . amLODipine  5 mg Oral Daily  . carvedilol  6.25 mg Oral BID WC  . darbepoetin (ARANESP) injection - DIALYSIS  150 mcg Subcutaneous Q Tue-1800  . escitalopram  10 mg Oral QHS  . feeding supplement  1 Container Oral TID BM  . feeding supplement (PRO-STAT SUGAR FREE 64)  30 mL Oral TID  . fluticasone furoate-vilanterol  1 puff Inhalation Daily  . heparin  5,000 Units Subcutaneous 3 times per day  . lamoTRIgine  50 mg Oral Daily  . levothyroxine  125 mcg Oral QAC breakfast  . pantoprazole  40 mg Oral Daily  . piperacillin-tazobactam (ZOSYN)  IV  2.25 g Intravenous 3 times per day  . polyethylene glycol  17 g Oral Daily  . potassium chloride  10 mEq Intravenous Q1 Hr x 4  . saccharomyces boulardii  250 mg Oral BID  . sodium  chloride flush  3 mL Intravenous Q12H   Continuous Infusions:     Time spent: 25 minutes    Selig Wampole MD  [facility] Pager [phone] If 7PM-7AM, please contact night-coverage at [email or web address], password [facility] 05/01/2015, 10:27 AM  LOS: 18 days

## 2015-05-01 NOTE — Care Management Important Message (Signed)
Important Message  Patient Details  Name: Craig [name] MRN: [id number] Date of Birth: 03/07/1945   Medicare Important Message Given:  Yes    Leslieanne Cobarrubias, Annamarie Majorheryl U, RN 05/01/2015, 10:46 AM

## 2015-05-01 NOTE — Clinical Social Work Note (Cosign Needed)
[facility] and will return when ready for discharge. CSW will continue to follow and facilitate discharge back to SNF when medically stable.  Baldo DaubJolan Kandace Elrod, Student Social Work Intern  [facility]  [phone]

## 2015-05-01 NOTE — Progress Notes (Signed)
Hypoglycemic Event  CBG: 66  Treatment: 15 GM carbohydrate snack  Symptoms: None  Follow-up CBG: Time:0507 CBG Result:105  Possible Reasons for Event: Inadequate meal intake     Craig [name], Lyman SpellerLaura E  Remember to initiate Hypoglycemia Order Set & complete

## 2015-05-01 NOTE — Progress Notes (Signed)
Patient ID: Elayne GuerinWilliam Allie, male   DOB: [birth year], 70 y.o.   MRN: [id number] Patient is seen in follow-up for dry gangrenous changes right forefoot. Examination patient has dry gangrene involving the foot with ischemic changes to the hindfoot as well. Discussed with the patient that with his severe peripheral vascular disease a foot salvage intervention most likely would not heal. Discussed with patient ideally he should proceed with an above-the-knee amputation. Patient states that he would not consider an above-the-knee amputation. Discussed that we could try a below the knee amputation but this may not heal. Patient states that he would have to think about it. Patient is nonambulatory, the safest option is a above-the-knee amputation.  I could proceed with surgery on Friday if patient decides that he wants to proceed with amputation for gangrene of the right lower extremity.

## 2015-05-01 NOTE — Progress Notes (Signed)
Critical lab value: K+ 2.7. Merdis DelayK. Schorr, NP on call notified at [phone]. Orders placed for IV K+ by Merdis DelayK. Schorr, NP at [phone]. Will continue to monitor the pt closely.   Feliciana RossettiLaura Lana Flaim, RN, BSN

## 2015-05-01 NOTE — Progress Notes (Signed)
[facility] ROUNDING NOTE   Subjective:   Interval History: no change today  Objective:  Vital signs in last 24 hours:  Temp:  [98.3 F (36.8 C)-99.3 F (37.4 C)] 98.6 F (37 C) (04/18 1000) Pulse Rate:  [70-78] 70 (04/18 1000) Resp:  [18-20] 18 (04/18 1000) BP: (155-165)/(50-56) 165/56 mmHg (04/18 1000) SpO2:  [97 %-99 %] 98 % (04/18 1000)  Weight change: 0 kg (0 lb) Filed Weights   04/29/15 2131 04/30/15 0705 04/30/15 1115  Weight: 84 kg (185 lb 3 oz) 84 kg (185 lb 3 oz) 83 kg (182 lb 15.7 oz)    Intake/Output: I/O last 3 completed shifts: In: 580 [P.O.:480; IV Piggyback:100] Out: 3200 [Urine:1700; Other:1000; Stool:500]   Intake/Output this shift:  Total I/O In: 440 [P.O.:240; IV Piggyback:200] Out: 0   CVS- RRR RS- CTA ABD- BS present soft non-distended EXT- no edema   Basic Metabolic Panel:  Recent Labs Lab 04/26/15 0440 04/27/15 0500 04/28/15 0401 04/29/15 0418 04/30/15 0504 05/01/15 0424  NA 138 140 141 142 143 142  K 3.7 3.5 3.2* 3.3* 2.9* 2.7*  CL 100* 101 102 103 103 103  CO2 GLUCOSE 71 101* 83 76 80 86  BUN 33* 36* 40* 44* 47* 23*  CREATININE 5.19* 6.40* 7.53* 8.36* 8.95* 5.61*  CALCIUM 8.2* 8.3* 8.5* 8.7* 8.4* 8.5*  PHOS 5.9* 5.9* 6.7* 7.9* 7.2*  --     Liver Function Tests:  Recent Labs Lab 04/27/15 0500 04/28/15 0401 04/29/15 0418 04/30/15 0504 05/01/15 0424  AST  --   --   --   --  14*  ALT  --   --   --   --  15*  ALKPHOS  --   --   --   --  46  BILITOT  --   --   --   --  0.6  PROT  --   --   --   --  5.9*  ALBUMIN 1.8* 1.8* 1.8* 1.7* 1.7*   No results for input(s): LIPASE, AMYLASE in the last 168 hours. No results for input(s): AMMONIA in the last 168 hours.  CBC:  Recent Labs Lab 04/27/15 0500 04/28/15 0401 04/29/15 0418 04/30/15 0504 05/01/15 0424  WBC 13.0* 15.5* 17.6* 18.9* 14.9*  HGB 7.9* 8.1* 8.3* 8.2* 8.3*  HCT 25.5* 25.6* 27.0* 26.0* 26.1*  MCV 78.5 78.5 79.4 78.5  79.1  PLT 209 240 263 253 224    Cardiac Enzymes: No results for input(s): CKTOTAL, CKMB, CKMBINDEX, TROPONINI in the last 168 hours.  BNP: Invalid input(s): POCBNP  CBG:  Recent Labs Lab 04/30/15 2357 05/01/15 0403 05/01/15 0507 05/01/15 0804 05/01/15 1144  GLUCAP 79 66 105* 83 69    Microbiology: Results for orders placed or performed during the hospital encounter of 04/13/15  Culture, blood (Routine X 2) w Reflex to ID Panel     Status: None   Collection Time: 04/14/15  4:11 AM  Result Value Ref Range Status   Specimen Description BLOOD LEFT ARM  Final   Special Requests BOTTLES DRAWN AEROBIC ONLY  Final   Culture NO GROWTH 5 DAYS  Final   Report Status 04/19/2015 FINAL  Final  Culture, blood (Routine X 2) w Reflex to ID Panel     Status: None   Collection Time: 04/14/15  4:16 AM  Result Value Ref Range Status   Specimen Description BLOOD LEFT HAND  Final   Special Requests BOTTLES  DRAWN AEROBIC ONLY  Final   Culture NO GROWTH 5 DAYS  Final   Report Status 04/19/2015 FINAL  Final  Urine culture     Status: None   Collection Time: 04/14/15  5:16 AM  Result Value Ref Range Status   Specimen Description URINE, SUPRAPUBIC  Final   Special Requests NONE  Final   Culture   Final    >=100,000 COLONIES/mL YEAST 40,000 COLONIES/ml PSEUDOMONAS AERUGINOSA    Report Status 04/16/2015 FINAL  Final   Organism ID, Bacteria PSEUDOMONAS AERUGINOSA  Final      Susceptibility   Pseudomonas aeruginosa - MIC*    CEFTAZIDIME 4 SENSITIVE Sensitive     CIPROFLOXACIN <=0.25 SENSITIVE Sensitive     GENTAMICIN 4 SENSITIVE Sensitive     IMIPENEM <=0.25 SENSITIVE Sensitive     PIP/TAZO <=4 SENSITIVE Sensitive     CEFEPIME 4 SENSITIVE Sensitive     * 40,000 COLONIES/ml PSEUDOMONAS AERUGINOSA  MRSA PCR Screening     Status: Abnormal   Collection Time: 04/14/15 10:03 AM  Result Value Ref Range Status   MRSA by PCR POSITIVE (A) NEGATIVE Final    Comment:        The GeneXpert  MRSA Assay (FDA approved for NASAL specimens only), is one component of a comprehensive MRSA colonization surveillance program. It is not intended to diagnose MRSA infection nor to guide or monitor treatment for MRSA infections. RESULT CALLED TO, READ BACK BY AND VERIFIED WITH: RN Sherlean Foot AT [id number] MARTINB     Coagulation Studies: No results for input(s): LABPROT, INR in the last 72 hours.  Urinalysis: No results for input(s): COLORURINE, LABSPEC, PHURINE, GLUCOSEU, HGBUR, BILIRUBINUR, KETONESUR, PROTEINUR, UROBILINOGEN, NITRITE, LEUKOCYTESUR in the last 72 hours.  Invalid input(s): APPERANCEUR    Imaging: No results found.   Medications:     . amiodarone  200 mg Oral Daily  . amLODipine  5 mg Oral Daily  . carvedilol  6.25 mg Oral BID WC  . darbepoetin (ARANESP) injection - DIALYSIS  150 mcg Subcutaneous Q Tue-1800  . escitalopram  10 mg Oral QHS  . feeding supplement  1 Container Oral TID BM  . feeding supplement (PRO-STAT SUGAR FREE 64)  30 mL Oral TID  . fluticasone furoate-vilanterol  1 puff Inhalation Daily  . heparin  5,000 Units Subcutaneous 3 times per day  . lamoTRIgine  50 mg Oral Daily  . levothyroxine  125 mcg Oral QAC breakfast  . pantoprazole  40 mg Oral Daily  . piperacillin-tazobactam (ZOSYN)  IV  2.25 g Intravenous 3 times per day  . polyethylene glycol  17 g Oral Daily  . potassium chloride  40 mEq Oral BID  . saccharomyces boulardii  250 mg Oral BID  . sodium chloride flush  3 mL Intravenous Q12H   acetaminophen **OR** acetaminophen, hydrALAZINE, ipratropium-albuterol, sodium chloride flush, sodium chloride flush, technetium TC 41M diethylenetriame-pentaacetic acid  Assessment/ Plan:  1. Renal- baseline renal function normal. Developed AKI the setting of foot ulcer/ostial and supratherapeutic vancomycin levels. He is now nonoliguric. He is not a candidate for long-term dialysis support, however, during the course of this hospitalization  doing dialysis PRN. Had HD on 4/7 , 4/8 and 4/11- now watch and do PRN -2. Foot ulcer/osteo- medical management for now. However, in conversations with palliative care patient has consented to amputation if absolutely necessary- certainly not getting any better 3. Anemia- status post Feraheme as well as transfusions- most recent transfusion on 4/8. Have added ESA-  last hgb 8.3 4. HTN/volume- is nonoliguric . On Norvasc, Coreg - BP adequate- minimal UF as am hoping renal function will recover 5. Hypokalemia- 4 K bath with HD as well as PRN repletion     LOS: 18 Craig [name] W @TODAY @2 :55 PM

## 2015-05-01 NOTE — Progress Notes (Signed)
Patient is declining to have a full assessment at this time. He would not allow RN to listen to heart, lungs, or bowel sounds. He declined to have legs assessed for pulses or edema. He also refused all PO meds. Will continue to monitor.  Leanna BattlesEckelmann, Irja Wheless Eileen, RN.

## 2015-05-02 LAB — CBC
HEMATOCRIT: 27.4 % — AB (ref 39.0–52.0)
HEMOGLOBIN: 8.4 g/dL — AB (ref 13.0–17.0)
MCH: 24.6 pg — AB (ref 26.0–34.0)
MCHC: 30.7 g/dL (ref 30.0–36.0)
MCV: 80.1 fL (ref 78.0–100.0)
Platelets: 237 10*3/uL (ref 150–400)
RBC: 3.42 MIL/uL — ABNORMAL LOW (ref 4.22–5.81)
RDW: 26.8 % — AB (ref 11.5–15.5)
WBC: 13.8 10*3/uL — ABNORMAL HIGH (ref 4.0–10.5)

## 2015-05-02 LAB — GLUCOSE, CAPILLARY
GLUCOSE-CAPILLARY: 109 mg/dL — AB (ref 65–99)
GLUCOSE-CAPILLARY: 66 mg/dL (ref 65–99)
Glucose-Capillary: 108 mg/dL — ABNORMAL HIGH (ref 65–99)
Glucose-Capillary: 116 mg/dL — ABNORMAL HIGH (ref 65–99)
Glucose-Capillary: 73 mg/dL (ref 65–99)
Glucose-Capillary: 98 mg/dL (ref 65–99)

## 2015-05-02 NOTE — Progress Notes (Signed)
[facility] PROGRESS NOTE  Craig [name] ZOX:[id number] DOB: Jun 12, 1945 DOA: 04/13/2015 PCP: Craig Boys, DO  Brief narrative 70 year old with history of peripheral vascular disease, chronic right toe ulcer, nonambulatory at baseline with poor functional status, severe protein calorie malnutrition, who was seen by a surgeon  on 03/30/2015 and  found to have frank pus draining from right second toe. Recommended for hospital admission with orthopedic evaluation but patient refused and was discharged home with plan to treat him on vancomycin and Zosyn.admitted on 04/13/2015 with foot ulcer Craig [name]- developed AKI in setting of ATN  Patient sent from skilled nursing facility and admitted to hospitalist service on 04/14/2015 due to acute encephalopathy likely secondary to renal failure from vancomycin toxicity. His vancomycin level on admission was 146.  X-rays of his right foot showed lytic destruction of the second proximal phalanx consistent with osteomyelitis. His creatinine on admission was 8.6, with a baseline creatinine 0.9. His creatinine continued to get worse and patient became severely uremic.  HD catheter placed and patient started on intermittent hemodialysis. Patient is being followed by nephrology, nephrology still assessing need for permanent hemodialysis. Patient is also being followed by orthopedic surgery,, Craig [name] has recommended above-knee amputation for patient's right foot dry gangrene. He has extensive peripheral vascular disease. Patient is not sure if he would like to proceed with amputation at this point. Patient not stable for discharge until these decisions are made.    Assessment/Plan:   sepsis  secondary to Pseudomonas UTI, right, foot osteomyelitis  elitis. Still has leukocytosis however remains afebrile. Blood cultures have been negative. Urine culture + pseudomonas On empiric Zosyn , recommended at least until 4/28 (6 weeks course). Vancomycin discontinued on  admission, level is still  supratherapeutic 62 . Plan to start daptomycin once level subtherapeutic, unless a source of infection is removed with amputation.  Continue to follow vancomycin levels.. Patient may not need daptomycin If he were to undergo surgery. Craig [name] recommends above-knee amputation which the patient is still contemplating  Acute tubular necrosis with oliguric renal failure Suspect vancomycin-induced nephrotoxicity with hypovolemia. Renal following. Had HD on 4/7 , 4/8 and 4/11- now watch and do PRN - was going to do HD on Friday and Saturday but pt refused. Has decent urine output and renal function slowly improving. Marland KitchenHe is not a candidate for long-term dialysis support, however, during the course of this hospitalization doing dialysis PRN. Had HD on 4/7 , 4/8 and 4/11- now watch and do PRN - No dialysis today will follow renal function and assess for recovery    Acute osteomyelitis of right foot with underlying peripheral vascular disease On empiric antibiotic. X-ray showed likely destruction of the second proximal phalanx. Craig [name] recommends continued medical management and if condition worsens he will need above-knee amputation. Craig [name]  And patient  to make a final decision   Acute hypoxic and hypercarbic respiratory failure Resolved. Chest x-ray unremarkable. ABG showing respiratory acidosis. VQ scan done on 4/2 showed low probably for PE.   Acute uremic/metabolic encephalopathy Resolved with hemodialysis. Continue aspiration precautions.  Intermittent hypoglycemia Likely due to poor by mouth intake. Monitor CBC closely.  A. Fib, paroxysmal  Currently in sinus rhythm, Rate controlled. Continue beta blocker and amiodarone. Not on anticoagulation. Has only been on asa for unclear reason, CHADSvasc score 3 with htn, pvd, age 58     COPD Continue albuterol nebs when necessary  Severe protein calorie malnutrition Continue supplement.  Candida UTI/pseudomonas  uti On Diflucan until 4/14. Treated  with zosyn.  Microcytic anemia Stable. Received PRBC on 4/2 and 4/8. Received faraheme on 4/1  Stage 1 pressure ulcer on heel and sacrum.  -allevyn dressings  Chronic colostomy and suprapubic catheter  DVT prophylaxis: Subcutaneous heparin  Diet: Dysphagia level III with supplements   Code Status: DO NOT RESUSCITATE Family Communication: None at bedside. POA is Craig [name] Disposition Plan: Eventually need skilled nursing facility. Hospital course prolonged due to ATN and ongoing vancomycin toxicity, need for intermittent hemodialysis, question about proceeding with right above-knee amputation Patient had palliative care consultation on 4/2 , at which time he expressed to move forward with amputation of the right toes if needed,Craig [name] to discuss goals/options again on 4/19   Consultants:  Renal  Craig [name]  palliative care  Procedures:  Right IJ catheter  Hemodialysis   Antibiotics: Vancomycin 3/17-4/1 (levels still supratherapeutic) IV Zosyn 3/17-4/28 (6 weeks course) Diflucan 4/7-4/14   HPI/Subjective: Patient continues to remain ambivalent about his plans, denies any pain in his leg   Objective: Filed Vitals:   05/02/15 0548 05/02/15 1000  BP: 153/58 155/56  Pulse: 74 75  Temp: 98.1 F (36.7 C) 98.8 F (37.1 C)  Resp: 18 18    Intake/Output Summary (Last 24 hours) at 05/02/15 1215 Last data filed at 05/02/15 0900  Gross per 24 hour  Intake    230 ml  Output    875 ml  Net   -645 ml   Filed Weights   04/29/15 2131 04/30/15 0705 04/30/15 1115  Weight: 84 kg (185 lb 3 oz) 84 kg (185 lb 3 oz) 83 kg (182 lb 15.7 oz)    Exam:   General:  Elderly male appears frail, not in distress    HEENT: Temporal wasting, Pallor present, moist mucosa, right IJ, supple neck  Chest: Clear bilaterally, no added sounds  Cardiovascular: Normal S1 and S2, no murmurs rub or gallop  GI: Soft, nondistended, nontender,  colostomy bag in place, suprapubic catheter  Musculoskeletal: Dried second toe of right foot, scattered erythema, severe muscle wasting  CNS: Alert and oriented  Data Reviewed: Basic Metabolic Panel:  Recent Labs Lab 04/26/15 0440 04/27/15 0500 04/28/15 0401 04/29/15 0418 04/30/15 0504 05/01/15 0424  NA 138 140 141 142 143 142  K 3.7 3.5 3.2* 3.3* 2.9* 2.7*  CL 100* 101 102 103 103 103  CO2 GLUCOSE 71 101* 83 76 80 86  BUN 33* 36* 40* 44* 47* 23*  CREATININE 5.19* 6.40* 7.53* 8.36* 8.95* 5.61*  CALCIUM 8.2* 8.3* 8.5* 8.7* 8.4* 8.5*  PHOS 5.9* 5.9* 6.7* 7.9* 7.2*  --    Liver Function Tests:  Recent Labs Lab 04/27/15 0500 04/28/15 0401 04/29/15 0418 04/30/15 0504 05/01/15 0424  AST  --   --   --   --  14*  ALT  --   --   --   --  15*  ALKPHOS  --   --   --   --  46  BILITOT  --   --   --   --  0.6  PROT  --   --   --   --  5.9*  ALBUMIN 1.8* 1.8* 1.8* 1.7* 1.7*   No results for input(s): LIPASE, AMYLASE in the last 168 hours. No results for input(s): AMMONIA in the last 168 hours. CBC:  Recent Labs Lab 04/28/15 0401 04/29/15 0418 04/30/15 0504 05/01/15 0424 05/02/15 0526  WBC 15.5* 17.6* 18.9* 14.9* 13.8*  HGB 8.1* 8.3* 8.2* 8.3* 8.4*  HCT 25.6* 27.0* 26.0* 26.1* 27.4*  MCV 78.5 79.4 78.5 79.1 80.1  PLT 240 263 253 224 237   Cardiac Enzymes: No results for input(s): CKTOTAL, CKMB, CKMBINDEX, TROPONINI in the last 168 hours. BNP (last 3 results) No results for input(s): BNP in the last 8760 hours.  ProBNP (last 3 results) No results for input(s): PROBNP in the last 8760 hours.  CBG:  Recent Labs Lab 05/01/15 1944 05/02/15 0031 05/02/15 0345 05/02/15 0725 05/02/15 1136  GLUCAP 94 116* 73 98 108*    No results found for this or any previous visit (from the past 240 hour(s)).   Studies: No results found.  Scheduled Meds: . amiodarone  200 mg Oral Daily  . amLODipine  5 mg Oral Daily  . carvedilol  6.25 mg Oral BID  WC  . darbepoetin (ARANESP) injection - DIALYSIS  150 mcg Subcutaneous Q Tue-1800  . escitalopram  10 mg Oral QHS  . feeding supplement  1 Container Oral TID BM  . feeding supplement (PRO-STAT SUGAR FREE 64)  30 mL Oral TID  . fluticasone furoate-vilanterol  1 puff Inhalation Daily  . heparin  5,000 Units Subcutaneous 3 times per day  . lamoTRIgine  50 mg Oral Daily  . levothyroxine  125 mcg Oral QAC breakfast  . pantoprazole  40 mg Oral Daily  . piperacillin-tazobactam (ZOSYN)  IV  2.25 g Intravenous 3 times per day  . polyethylene glycol  17 g Oral Daily  . potassium chloride  40 mEq Oral BID  . saccharomyces boulardii  250 mg Oral BID  . sodium chloride flush  3 mL Intravenous Q12H   Continuous Infusions:     Time spent: 25 minutes    Amit Leece MD  [facility] Pager [phone] If 7PM-7AM, please contact night-coverage at [email or web address], password [email or web address] 05/02/2015, 12:15 PM  LOS: 19 days

## 2015-05-02 NOTE — Clinical Social Work Note (Signed)
[facility] CSW continuing to follow patient's progress and he is not yet stable for discharge. CSW will continue to monitor patient's progress and facilitate discharge back to SNF when medically stable.  Genelle BalVanessa Noriel Guthrie, MSW, LCSW Licensed Clinical Social Worker Clinical Social Work Department [facility] [phone]

## 2015-05-02 NOTE — Progress Notes (Signed)
[facility] ROUNDING NOTE   Subjective:   Interval History:  No complaints  States appetite is fair  No diarrhea  Objective:  Vital signs in last 24 hours:  Temp:  [98 F (36.7 C)-98.6 F (37 C)] 98.1 F (36.7 C) (04/19 0548) Pulse Rate:  [70-76] 74 (04/19 0548) Resp:  [18] 18 (04/19 0548) BP: (153-165)/(55-58) 153/58 mmHg (04/19 0548) SpO2:  [97 %-98 %] 97 % (04/19 0548)  Weight change:  Filed Weights   04/29/15 2131 04/30/15 0705 04/30/15 1115  Weight: 84 kg (185 lb 3 oz) 84 kg (185 lb 3 oz) 83 kg (182 lb 15.7 oz)    Intake/Output: I/O last 3 completed shifts: In: 960 [P.O.:660; IV Piggyback:300] Out: 1325 [Urine:500; Stool:825]   Intake/Output this shift:     CVS- RRR RS- CTA  Oxygen [place] ABD- BS present soft non-distended EXT- no edema   Basic Metabolic Panel:  Recent Labs Lab 04/26/15 0440 04/27/15 0500 04/28/15 0401 04/29/15 0418 04/30/15 0504 05/01/15 0424  NA 138 140 141 142 143 142  K 3.7 3.5 3.2* 3.3* 2.9* 2.7*  CL 100* 101 102 103 103 103  CO2 GLUCOSE 71 101* 83 76 80 86  BUN 33* 36* 40* 44* 47* 23*  CREATININE 5.19* 6.40* 7.53* 8.36* 8.95* 5.61*  CALCIUM 8.2* 8.3* 8.5* 8.7* 8.4* 8.5*  PHOS 5.9* 5.9* 6.7* 7.9* 7.2*  --     Liver Function Tests:  Recent Labs Lab 04/27/15 0500 04/28/15 0401 04/29/15 0418 04/30/15 0504 05/01/15 0424  AST  --   --   --   --  14*  ALT  --   --   --   --  15*  ALKPHOS  --   --   --   --  46  BILITOT  --   --   --   --  0.6  PROT  --   --   --   --  5.9*  ALBUMIN 1.8* 1.8* 1.8* 1.7* 1.7*   No results for input(s): LIPASE, AMYLASE in the last 168 hours. No results for input(s): AMMONIA in the last 168 hours.  CBC:  Recent Labs Lab 04/28/15 0401 04/29/15 0418 04/30/15 0504 05/01/15 0424 05/02/15 0526  WBC 15.5* 17.6* 18.9* 14.9* 13.8*  HGB 8.1* 8.3* 8.2* 8.3* 8.4*  HCT 25.6* 27.0* 26.0* 26.1* 27.4*  MCV 78.5 79.4 78.5 79.1 80.1  PLT 240 263 253 224 237     Cardiac Enzymes: No results for input(s): CKTOTAL, CKMB, CKMBINDEX, TROPONINI in the last 168 hours.  BNP: Invalid input(s): POCBNP  CBG:  Recent Labs Lab 05/01/15 1657 05/01/15 1944 05/02/15 0031 05/02/15 0345 05/02/15 0725  GLUCAP 72 94 116* 73 98    Microbiology: Results for orders placed or performed during the hospital encounter of 04/13/15  Culture, blood (Routine X 2) w Reflex to ID Panel     Status: None   Collection Time: 04/14/15  4:11 AM  Result Value Ref Range Status   Specimen Description BLOOD LEFT ARM  Final   Special Requests BOTTLES DRAWN AEROBIC ONLY  Final   Culture NO GROWTH 5 DAYS  Final   Report Status 04/19/2015 FINAL  Final  Culture, blood (Routine X 2) w Reflex to ID Panel     Status: None   Collection Time: 04/14/15  4:16 AM  Result Value Ref Range Status   Specimen Description BLOOD LEFT HAND  Final   Special Requests BOTTLES DRAWN  AEROBIC ONLY  Final   Culture NO GROWTH 5 DAYS  Final   Report Status 04/19/2015 FINAL  Final  Urine culture     Status: None   Collection Time: 04/14/15  5:16 AM  Result Value Ref Range Status   Specimen Description URINE, SUPRAPUBIC  Final   Special Requests NONE  Final   Culture   Final    >=100,000 COLONIES/mL YEAST 40,000 COLONIES/ml PSEUDOMONAS AERUGINOSA    Report Status 04/16/2015 FINAL  Final   Organism ID, Bacteria PSEUDOMONAS AERUGINOSA  Final      Susceptibility   Pseudomonas aeruginosa - MIC*    CEFTAZIDIME 4 SENSITIVE Sensitive     CIPROFLOXACIN <=0.25 SENSITIVE Sensitive     GENTAMICIN 4 SENSITIVE Sensitive     IMIPENEM <=0.25 SENSITIVE Sensitive     PIP/TAZO <=4 SENSITIVE Sensitive     CEFEPIME 4 SENSITIVE Sensitive     * 40,000 COLONIES/ml PSEUDOMONAS AERUGINOSA  MRSA PCR Screening     Status: Abnormal   Collection Time: 04/14/15 10:03 AM  Result Value Ref Range Status   MRSA by PCR POSITIVE (A) NEGATIVE Final    Comment:        The GeneXpert MRSA Assay (FDA approved for  NASAL specimens only), is one component of a comprehensive MRSA colonization surveillance program. It is not intended to diagnose MRSA infection nor to guide or monitor treatment for MRSA infections. RESULT CALLED TO, READ BACK BY AND VERIFIED WITH: RN Sherlean Foot AT [id number] MARTINB     Coagulation Studies: No results for input(s): LABPROT, INR in the last 72 hours.  Urinalysis: No results for input(s): COLORURINE, LABSPEC, PHURINE, GLUCOSEU, HGBUR, BILIRUBINUR, KETONESUR, PROTEINUR, UROBILINOGEN, NITRITE, LEUKOCYTESUR in the last 72 hours.  Invalid input(s): APPERANCEUR    Imaging: No results found.   Medications:     . amiodarone  200 mg Oral Daily  . amLODipine  5 mg Oral Daily  . carvedilol  6.25 mg Oral BID WC  . darbepoetin (ARANESP) injection - DIALYSIS  150 mcg Subcutaneous Q Tue-1800  . escitalopram  10 mg Oral QHS  . feeding supplement  1 Container Oral TID BM  . feeding supplement (PRO-STAT SUGAR FREE 64)  30 mL Oral TID  . fluticasone furoate-vilanterol  1 puff Inhalation Daily  . heparin  5,000 Units Subcutaneous 3 times per day  . lamoTRIgine  50 mg Oral Daily  . levothyroxine  125 mcg Oral QAC breakfast  . pantoprazole  40 mg Oral Daily  . piperacillin-tazobactam (ZOSYN)  IV  2.25 g Intravenous 3 times per day  . polyethylene glycol  17 g Oral Daily  . potassium chloride  40 mEq Oral BID  . saccharomyces boulardii  250 mg Oral BID  . sodium chloride flush  3 mL Intravenous Q12H   acetaminophen **OR** acetaminophen, hydrALAZINE, ipratropium-albuterol, sodium chloride flush, sodium chloride flush, technetium TC 30M diethylenetriame-pentaacetic acid  Assessment/ Plan:  1. Renal- baseline renal function normal. Developed AKI the setting of foot ulcer/ostial and supratherapeutic vancomycin levels. He is now nonoliguric. He is not a candidate for long-term dialysis support, however, during the course of this hospitalization doing dialysis PRN. Had HD on  4/7 , 4/8 and 4/11- now watch and do PRN - No dialysis today will follow renal function and assess for recovery 2. Foot ulcer/osteo- medical management for now. However, in conversations with palliative care patient has consented to amputation if absolutely necessary- certainly not getting any better 3. Anemia- status post Feraheme as  well as transfusions- most recent transfusion on 4/8. Have added ESA- last hgb 8.3 4. HTN/volume- is nonoliguric . On Norvasc, Coreg - BP adequate- minimal UF as am hoping renal function will recover 5. Hypokalemia- 4 K bath with HD as well as PRN repletion    LOS: 19 Neasia Fleeman W @TODAY @8 :34 AM

## 2015-05-02 NOTE — Progress Notes (Signed)
Hypoglycemic Event  CBG: 66  Treatment: 15 GM carbohydrate snack  Symptoms: None  Follow-up CBG: Patient refused. CBG Result: n/a  Possible Reasons for Event: Inadequate meal intake  Comments/MD notified:Schorr, NP, notified.

## 2015-05-03 DIAGNOSIS — R627 Adult failure to thrive: Secondary | ICD-10-CM

## 2015-05-03 DIAGNOSIS — M8610 Other acute osteomyelitis, unspecified site: Secondary | ICD-10-CM

## 2015-05-03 DIAGNOSIS — A419 Sepsis, unspecified organism: Secondary | ICD-10-CM

## 2015-05-03 DIAGNOSIS — M86171 Other acute osteomyelitis, right ankle and foot: Secondary | ICD-10-CM

## 2015-05-03 DIAGNOSIS — N179 Acute kidney failure, unspecified: Secondary | ICD-10-CM

## 2015-05-03 DIAGNOSIS — L02611 Cutaneous abscess of right foot: Secondary | ICD-10-CM

## 2015-05-03 DIAGNOSIS — Z789 Other specified health status: Secondary | ICD-10-CM

## 2015-05-03 LAB — COMPREHENSIVE METABOLIC PANEL
ALBUMIN: 1.8 g/dL — AB (ref 3.5–5.0)
ALK PHOS: 38 U/L (ref 38–126)
ALT: 14 U/L — ABNORMAL LOW (ref 17–63)
ANION GAP: 15 (ref 5–15)
AST: 13 U/L — ABNORMAL LOW (ref 15–41)
BUN: 32 mg/dL — ABNORMAL HIGH (ref 6–20)
CALCIUM: 8.5 mg/dL — AB (ref 8.9–10.3)
CO2: 22 mmol/L (ref 22–32)
Chloride: 109 mmol/L (ref 101–111)
Creatinine, Ser: 7.14 mg/dL — ABNORMAL HIGH (ref 0.61–1.24)
GFR calc Af Amer: 8 mL/min — ABNORMAL LOW (ref >60)
GFR calc non Af Amer: 7 mL/min — ABNORMAL LOW (ref >60)
GLUCOSE: 85 mg/dL (ref 65–99)
POTASSIUM: 4.1 mmol/L (ref 3.5–5.1)
SODIUM: 146 mmol/L — AB (ref 135–145)
Total Bilirubin: 0.6 mg/dL (ref 0.3–1.2)
Total Protein: 6 g/dL — ABNORMAL LOW (ref 6.5–8.1)

## 2015-05-03 LAB — GLUCOSE, CAPILLARY
GLUCOSE-CAPILLARY: 78 mg/dL (ref 65–99)
Glucose-Capillary: 143 mg/dL — ABNORMAL HIGH (ref 65–99)
Glucose-Capillary: 83 mg/dL (ref 65–99)

## 2015-05-03 LAB — CBC
HCT: 27.4 % — ABNORMAL LOW (ref 39.0–52.0)
HEMOGLOBIN: 8.3 g/dL — AB (ref 13.0–17.0)
MCH: 24.5 pg — ABNORMAL LOW (ref 26.0–34.0)
MCHC: 30.3 g/dL (ref 30.0–36.0)
MCV: 80.8 fL (ref 78.0–100.0)
Platelets: 225 10*3/uL (ref 150–400)
RBC: 3.39 MIL/uL — ABNORMAL LOW (ref 4.22–5.81)
RDW: 26.7 % — ABNORMAL HIGH (ref 11.5–15.5)
WBC: 15 10*3/uL — ABNORMAL HIGH (ref 4.0–10.5)

## 2015-05-03 LAB — VANCOMYCIN, RANDOM: Vancomycin Rm: 43 ug/mL

## 2015-05-03 MED ORDER — ATROPINE SULFATE 1 % OP SOLN
2.0000 [drp] | OPHTHALMIC | Status: DC | PRN
  Filled 2015-05-03: qty 2

## 2015-05-03 MED ORDER — DIAZEPAM 1 MG/ML PO SOLN: 2.5000 mg | Freq: Four times a day (QID) | ORAL | Status: DC | PRN

## 2015-05-03 MED ORDER — MORPHINE SULFATE (CONCENTRATE) 20 MG/ML PO SOLN: 4.0000 mg | ORAL | Status: AC | PRN

## 2015-05-03 MED ORDER — DIAZEPAM 5 MG/ML PO CONC: 2.5000 mg | Freq: Four times a day (QID) | ORAL | Status: DC | PRN

## 2015-05-03 MED ORDER — HEPARIN SOD (PORK) LOCK FLUSH 100 UNIT/ML IV SOLN
250.0000 [IU] | INTRAVENOUS | Status: AC | PRN
  Administered 2015-05-03: 250 [IU]

## 2015-05-03 MED ORDER — OXYCODONE-ACETAMINOPHEN 5-325 MG PO TABS
2.0000 | ORAL_TABLET | Freq: Once | ORAL | Status: AC
  Administered 2015-05-03: 2 via ORAL
  Filled 2015-05-03: qty 2

## 2015-05-03 MED ORDER — DIAZEPAM 1 MG/ML PO SOLN: 2.5000 mg | Freq: Three times a day (TID) | ORAL | Status: AC | PRN

## 2015-05-03 MED ORDER — HYDROMORPHONE HCL 1 MG/ML PO LIQD
2.0000 mg | ORAL | Status: DC | PRN
Start: 2015-05-03 — End: 2015-05-03

## 2015-05-03 MED ORDER — HYDROMORPHONE HCL 2 MG PO TABS: 2.0000 mg | ORAL_TABLET | ORAL | Status: DC | PRN

## 2015-05-03 NOTE — Progress Notes (Addendum)
Daily Progress Note   Patient Name: Craig [name]       Date: 05/03/2015 DOB: October 05, 1945  Age: 70 y.o. MRN#: [id number] Attending Physician: Monica Becton, MD Primary Care Physician: Gildardo Cranker, DO Admit Date: 04/13/2015  Reason for Consultation/Follow-up: Establishing goals of care, Hospice Evaluation, Non pain symptom management, Pain control and Psychosocial/spiritual support  Patient Profile: 70 yo man with severe PVD, CKD and gangrenous lower extremities  Length of Stay: 20 days  Current Medications: Scheduled Meds:  . amiodarone  200 mg Oral Daily  . amLODipine  5 mg Oral Daily  . carvedilol  6.25 mg Oral BID WC  . darbepoetin (ARANESP) injection - DIALYSIS  150 mcg Subcutaneous Q Tue-1800  . escitalopram  10 mg Oral QHS  . feeding supplement  1 Container Oral TID BM  . feeding supplement (PRO-STAT SUGAR FREE 64)  30 mL Oral TID  . fluticasone furoate-vilanterol  1 puff Inhalation Daily  . heparin  5,000 Units Subcutaneous 3 times per day  . lamoTRIgine  50 mg Oral Daily  . levothyroxine  125 mcg Oral QAC breakfast  . pantoprazole  40 mg Oral Daily  . piperacillin-tazobactam (ZOSYN)  IV  2.25 g Intravenous 3 times per day  . polyethylene glycol  17 g Oral Daily  . potassium chloride  40 mEq Oral BID  . saccharomyces boulardii  250 mg Oral BID  . sodium chloride flush  3 mL Intravenous Q12H    Continuous Infusions:    PRN Meds: acetaminophen **OR** acetaminophen, hydrALAZINE, ipratropium-albuterol, sodium chloride flush, sodium chloride flush, technetium TC 53M diethylenetriame-pentaacetic acid  Physical Exam: Physical Exam              Vital Signs: BP 128/54 mmHg  Pulse 79  Temp(Src) 98.7 F (37.1 C) (Oral)  Resp 20  Ht 6' 3" (1.905 m)  Wt 83 kg (182  lb 15.7 oz)  BMI 22.87 kg/m2  SpO2 98% SpO2: SpO2: 98 % O2 Device: O2 Device: Nasal Cannula O2 Flow Rate: O2 Flow Rate (L/min): 2 L/min  Intake/output summary:  Intake/Output Summary (Last 24 hours) at 05/03/15 0931 Last data filed at 05/03/15 0600  Gross per 24 hour  Intake    760 ml  Output   1050 ml  Net   -290 ml   LBM: Last BM  Date: 05/02/15 Baseline Weight: Weight: 72.576 kg (160 lb) Most recent weight: Weight: 83 kg (182 lb 15.7 oz)       Palliative Assessment/Data: Flowsheet Rows        Most Recent Value   Intake Tab    Referral Department  Hospitalist   Unit at Time of Referral  Cardiac/Telemetry Unit   Palliative Care Primary Diagnosis  Sepsis/Infectious Disease   Date Notified  04/15/15   Palliative Care Type  New Palliative care   Reason for referral  Clarify Goals of Care   Date of Admission  04/13/15   Date first seen by Palliative Care  04/15/15   # of days Palliative referral response time  0 Day(s)   # of days IP prior to Palliative referral  2   Clinical Assessment    Palliative Performance Scale Score  40%   Pain Max last 24 hours  Not able to report   Pain Min Last 24 hours  Not able to report   Dyspnea Max Last 24 Hours  Not able to report   Dyspnea Min Last 24 hours  Not able to report   Nausea Max Last 24 Hours  Not able to report   Nausea Min Last 24 Hours  Not able to report   Anxiety Max Last 24 Hours  Not able to report   Anxiety Min Last 24 Hours  Not able to report   Other Max Last 24 Hours  Not able to report   Psychosocial & Spiritual Assessment    Palliative Care Outcomes    Patient/Family meeting held?  Yes   Who was at the meeting?  spoke to Craig [name] as well as Craig [name] on the phone   Palliative Care Outcomes  Clarified goals of care      Additional Data Reviewed: CBC    Component Value Date/Time   WBC 15.0* 05/03/2015 0518   WBC 8.6 03/14/2015   RBC 3.39* 05/03/2015 0518   RBC 3.99* 03/03/2015 0456   HGB 8.3* 05/03/2015 0518    HCT 27.4* 05/03/2015 0518   PLT 225 05/03/2015 0518   MCV 80.8 05/03/2015 0518   MCH 24.5* 05/03/2015 0518   MCHC 30.3 05/03/2015 0518   RDW 26.7* 05/03/2015 0518   LYMPHSABS 0.7 04/14/2015 0935   MONOABS 0.1 04/14/2015 0935   EOSABS 0.0 04/14/2015 0935   BASOSABS 0.0 04/14/2015 0935    CMP     Component Value Date/Time   NA 146* 05/03/2015 0518   NA 140 03/14/2015   K 4.1 05/03/2015 0518   CL 109 05/03/2015 0518   CO2 22 05/03/2015 0518   GLUCOSE 85 05/03/2015 0518   BUN 32* 05/03/2015 0518   BUN 12 03/14/2015   CREATININE 7.14* 05/03/2015 0518   CREATININE 0.9 03/14/2015   CALCIUM 8.5* 05/03/2015 0518   PROT 6.0* 05/03/2015 0518   ALBUMIN 1.8* 05/03/2015 0518   AST 13* 05/03/2015 0518   ALT 14* 05/03/2015 0518   ALKPHOS 38 05/03/2015 0518   BILITOT 0.6 05/03/2015 0518   GFRNONAA 7* 05/03/2015 0518   GFRAA 8* 05/03/2015 0518       Problem List:  Patient Active Problem List   Diagnosis Date Noted  . Acute osteomyelitis of right foot ([place])   . 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 Sepsis ([place]) 04/14/2015  . Osteomyelitis ([place]) 04/13/2015  . Ulcer of right second toe with necrosis of muscle ([place]) 04/03/2015  . Atherosclerosis of native arteries of the extremities  with ulceration ([place]) 03/30/2015  . Abscess of second toe of right foot 03/30/2015  . SBO (small bowel obstruction) ([place])   . Palliative care encounter   . Pressure ulcer 03/02/2015  . Nausea and vomiting 03/01/2015  . Ileus, unspecified ([place]) 03/01/2015  . Small bowel obstruction, partial ([place]) 03/01/2015  . Dehydration 03/01/2015  . AKI (acute kidney injury) ([place]) 03/01/2015  . Bowel obstruction ([place]) 03/01/2015  . Peripheral vascular disease ([place]) 03/01/2015  . Failure to thrive in adult 03/01/2015  . Loss of weight 12/31/2014  . Aspiration pneumonia ([place]) 10/21/2014  . HCAP (healthcare-associated pneumonia) 10/21/2014  . Leukocytosis 10/21/2014  . Protein-calorie  malnutrition, severe ([place]) 10/21/2014  . Anemia of chronic disease 10/21/2014  . GERD without esophagitis 10/21/2014  . Encounter for palliative care   . UTI (urinary tract infection) 08/28/2014  . Urine retention 08/28/2014  . Chronic pain 01/29/2014  . Hypokalemia 12/20/2013  . Anemia 04/29/2013  . HTN (hypertension) 04/27/2013  . COPD (chronic obstructive pulmonary disease) ([place]) 04/27/2013  . Dysphagia 04/27/2013  . Insomnia 04/10/2013  . Hypothyroidism 03/05/2013  . Depression 03/05/2013  . A-fib ([place]) 03/05/2013  . GERD (gastroesophageal reflux disease) 03/05/2013     Palliative Care Assessment & Plan   Craig [name] is a 70 yo man from [place], [place] admitted with AKI, in setting of gangrenous Lower extremity. He has not walked in 4 years and is a resident at [facility].   He has full capacity and I also met with his HCPOA-he wants full comfort care, to not have his life prolonged given his current condition, to allow for a natural death to occur. He does not want amputation now or in there future. Palliative wound care, pain control and symptom management.   I discussed hospice care and how we could make his remaining time the best it could be- he is very tired of aggressive medical interventions and just wants to feel human again. Right now he does not complain of pain.    No amputation  No Hemodialysis  Transfer TODAY if possible back to [facility].  Palliative Wound Care  No antibiotics  Terminal Care Orders  5. Prognosis: < 2 weeks  6. Discharge Planning:  [facility] with Hospice   Care plan was discussed with patient and his HCPOA, friend Craig [name]  Thank you for allowing the Palliative Medicine Team to assist in the care of this patient.   Time In: 9 Time Out: 950 Total Time 50 min Prolonged Time Billed no        Acquanetta Chain, DO  05/03/2015, 9:31 AM  Please contact Palliative  Medicine Team phone at [phone] for questions and concerns.

## 2015-05-03 NOTE — Discharge Summary (Signed)
Physician Discharge Summary  Craig [name] RUE:[id number] DOB: 06/18/1945 DOA: 04/13/2015  PCP: Kirt Boys, DO  Admit date: 04/13/2015 Discharge date: 05/03/2015  Time spent: 35 minutes  Discharge Diagnoses:  Principal Problem:   Sepsis (HCC) Active Problems:   UTI (urinary tract infection)   Protein-calorie malnutrition, severe (HCC)   AKI (acute kidney injury) (HCC)   Failure to thrive in adult   Abscess of second toe of right foot   Osteomyelitis (HCC)   (induced) termination of pregnancy with other complications   Acute osteomyelitis of right foot [facility])   Discharge Condition: critical  Diet recommendation: regular, mechanical soft  Filed Weights   04/29/15 2131 04/30/15 0705 04/30/15 1115  Weight: 84 kg (185 lb 3 oz) 84 kg (185 lb 3 oz) 83 kg (182 lb 15.7 oz)    History of present illness:  70 year old with history of peripheral vascular disease, chronic right toe ulcer, nonambulatory at baseline with poor functional status, severe protein calorie malnutrition, who was seen by a surgeon on 03/30/2015 and found to have frank pus draining from right second toe. Recommended for hospital admission with orthopedic evaluation but patient refused and was discharged home with plan to treat him on vancomycin and Zosyn.admitted on 04/13/2015 with foot ulcer [name]- developed AKI in setting of ATN  Patient sent from skilled nursing facility and admitted to hospitalist service on 04/14/2015 due to acute encephalopathy likely secondary to renal failure from vancomycin toxicity. His vancomycin level on admission was 146.  X-rays of his right foot showed lytic destruction of the second proximal phalanx consistent with osteomyelitis. His creatinine on admission was 8.6, with a baseline creatinine 0.9.Encephalopathy improved with dialysis His creatinine continued to get worse and patient became severely uremic. HD catheter placed and patient started on intermittent hemodialysis. Patient  is being followed by nephrology, nephrology still assessing need for permanent hemodialysis. Patient is also being followed by orthopedic surgery,, Dr. Lajoyce Corners has recommended above-knee amputation for patient's right foot dry gangrene. He has extensive peripheral vascular disease. Patient refused for the procedure. Palliative services were consulted and patient agreed for complete comfort care care. Patient declined for further Hemodialysis.    Consultations: Orthopedic surgery Nephrology Palliative care services  Discharge Exam: Filed Vitals:   05/03/15 0435 05/03/15 1000  BP: 128/54 137/45  Pulse: 79 68  Temp: 98.7 F (37.1 C) 98 F (36.7 C)  Resp: 20 18    General: Chronically ill-looking male looks lethargic Cardiovascular: S1-S2 regular Respiratory: Decreased breath sounds in the lower lobes Abdomen bowel sounds soft nontender nondistended Extremities: Gangrenous right leg in the dressing  Discharge Instructions   Discharge Instructions    Discharge instructions    Complete by:  As directed           Current Discharge Medication List    CONTINUE these medications which have NOT CHANGED   Details  morphine (ROXANOL) 20 MG/ML concentrated solution Take 4 mg by mouth every 2 (two) hours as needed for severe pain.      STOP taking these medications     Amino Acids-Protein Hydrolys (FEEDING SUPPLEMENT, PRO-STAT SUGAR FREE 64,) LIQD      amiodarone (PACERONE) 200 MG tablet      aspirin EC 81 MG tablet      cetirizine (ZYRTEC) 10 MG tablet      escitalopram (LEXAPRO) 10 MG tablet      Fluticasone Furoate-Vilanterol (BREO ELLIPTA) 100-25 MCG/INH AEPB      folic acid (FOLVITE) 1 MG tablet  hydrOXYzine (ATARAX/VISTARIL) 25 MG tablet      Iron Polysacch Cmplx-B12-FA 150-0.025-1 MG CAPS      K Phos Mono-Sod Phos Di & Mono (PHOSPHA 250 NEUTRAL PO)      lamoTRIgine (LAMICTAL) 25 MG tablet      levothyroxine (SYNTHROID, LEVOTHROID) 125 MCG tablet       Melatonin 3 MG TABS      Multiple Vitamins-Minerals (DECUBI-VITE PO)      Nutritional Supplements (NUTRITIONAL SUPPLEMENT PO)      Nutritional Supplements (NUTRITIONAL SUPPLEMENT PO)      omeprazole (PRILOSEC) 20 MG capsule      ondansetron (ZOFRAN) 4 MG tablet      OXYGEN      piperacillin-tazobactam (ZOSYN) 3.375 GM/50ML IVPB      polyethylene glycol (MIRALAX / GLYCOLAX) packet      Povidone-Iodine (BETADINE EX)      promethazine (PHENERGAN) 25 MG tablet      thiamine 100 MG tablet      vancomycin in sodium chloride 0.9 % 250 mL      vitamin C (ASCORBIC ACID) 500 MG tablet        No Known Allergies Follow-up Information    Follow up with [facility] .   Specialty:  Skilled Nursing Facility   Contact information:   [address] [place] [address] [phone]       The results of significant diagnostics from this hospitalization (including imaging, microbiology, ancillary and laboratory) are listed below for reference.    Significant Diagnostic Studies: Dg Chest 2 View  04/13/2015  CLINICAL DATA:  PICC line placement. EXAM: CHEST  2 VIEW COMPARISON:  March 02, 2015. FINDINGS: Stable cardiomediastinal silhouette. No pneumothorax or pleural effusion is noted. Atherosclerosis of thoracic aorta is noted. No acute pulmonary disease is noted. Right-sided PICC line is noted with distal tip in expected position of the SVC. Bony thorax is unremarkable. IMPRESSION: Right-sided PICC line is noted with distal tip in expected position of the SVC. Electronically Signed   By: Lupita Raider, M.D.   On: 04/13/2015 19:26   US Renal  04/14/2015  CLINICAL DATA:  Acute kidney injury, hypertension, COPD, alcoholic cirrhosis, coronary artery disease post MI EXAM: RENAL / URINARY TRACT ULTRASOUND COMPLETE COMPARISON:  CT abdomen and pelvis 03/01/2015 FINDINGS: Right Kidney: Length: 12.1 cm. Normal cortical thickness. Increased cortical  echogenicity. Hypoechoic nodule mid RIGHT kidney 2.0 x 2.0 x 1.9 cm, unchanged from prior CT, consistent with minimally complicated cyst. No additional mass, hydronephrosis or shadowing calcification. Left Kidney: Length: 11.6 cm. Less well visualized than RIGHT kidney due to body habitus. Normal cortical thickness. Increased cortical echogenicity. No mass, hydronephrosis or shadowing calcification grossly visualized. Bladder: Decompressed by a suprapubic catheter, unable to evaluate IMPRESSION: Echogenic renal parenchyma bilaterally consistent with medical renal disease. Minimally complicated RIGHT renal cyst. No additional mass or hydronephrosis grossly visualized. Electronically Signed   By: Ulyses Southward M.D.   On: 04/14/2015 14:46   Nm Pulmonary Perf And Vent  04/15/2015  CLINICAL DATA:  Hypoxia, COPD, hypertension, coronary artery disease post MI, alcoholic cirrhosis, former smoker EXAM: NUCLEAR MEDICINE VENTILATION - PERFUSION LUNG SCAN TECHNIQUE: Ventilation images were obtained in multiple projections using inhaled aerosol Tc-15m DTPA. Perfusion images were obtained in multiple projections after intravenous injection of Tc-57m MAA. RADIOPHARMACEUTICALS:  30.8 mCi Technetium-93m DTPA aerosol inhalation and 4.38 mCi Technetium-90m MAA IV COMPARISON:  None Radiographic correlation: Chest radiograph 04/15/2015; CT chest 04/26/2013 FINDINGS: Ventilation: Markedly  abnormal ventilation exam with small patchy areas of diminished and absent perfusion throughout both lungs. Ventilation is most impaired lower lobes bilaterally Perfusion: Irregular perfusion throughout both lungs. Scattered small subsegmental perfusion defects largest in RIGHT middle lobe. Diminished perfusion in the posterior lungs particularly lower lobes bilaterally matching ventilation pattern. Overall, better perfusion than ventilation throughout both lungs. Chest radiograph: Bibasilar atelectasis greater on LEFT. Significant bronchiectasis in  lower lobes by prior CT. IMPRESSION: Diffuse marked impairment in ventilation throughout both lungs compatible with parenchymal lung disease. Much less severely impaired perfusion in both lungs with a few small subsegmental perfusion defects. Findings are most suggestive of parenchymal lung disease and represent a low probability for pulmonary embolism. Electronically Signed   By: Ulyses SouthwardMark  Boles M.D.   On: 04/15/2015 11:02   Dg Chest Port 1 View  04/20/2015  CLINICAL DATA:  Central catheter placement EXAM: PORTABLE CHEST 1 VIEW COMPARISON:  April 15, 2015 FINDINGS: Right jugular catheter tip is in the superior vena cava. Right peripherally inserted central catheter tip is in the superior vena cava. No pneumothorax. There is mild bibasilar atelectasis. Lungs elsewhere clear. Heart size and pulmonary vascularity are normal. No adenopathy. There is extensive atherosclerotic calcification in the aorta. IMPRESSION: Central catheter tips are in the superior vena cava. No pneumothorax. Mild bibasilar atelectasis. No edema or consolidation. Stable cardiac silhouette. Extensive atherosclerotic calcification in the aorta. Electronically Signed   By: Bretta BangWilliam  Woodruff III M.D.   On: 04/20/2015 11:34   Dg Chest Port 1 View  04/15/2015  CLINICAL DATA:  Shortness of breath and decreased oxygen saturation overnight last night, COPD, hypertension and atrial fibrillation, former smoker, coronary artery disease post MI, alcoholic cirrhosis EXAM: PORTABLE CHEST 1 VIEW COMPARISON:  Portable exam 1040 hours compared to 04/13/2015 and correlated with chest CT 04/26/2013 FINDINGS: RIGHT arm PICC line tip projects over SVC. Normal heart size and pulmonary vascularity. Calcified thoracic aorta. Mild bibasilar atelectasis. Remaining lungs grossly clear. No pleural effusion or pneumothorax. Bronchiectatic changes in the lower lobes on the prior CT from 2015 are not radiographically evident. 2 metallic foreign bodies question BBs project over  the mid RIGHT chest. IMPRESSION: Bibasilar atelectasis. Electronically Signed   By: Ulyses SouthwardMark  Boles M.D.   On: 04/15/2015 10:55   Dg Foot Complete Right  04/13/2015  CLINICAL DATA:  Open wound of second toe. EXAM: RIGHT FOOT COMPLETE - 3+ VIEW COMPARISON:  April 27, 2013. FINDINGS: Vascular calcifications are noted. Lytic destruction of the distal portion of the second proximal phalanx is noted consistent with osteomyelitis. No other acute bony abnormality is noted. No radiopaque foreign body is noted. IMPRESSION: Lytic destruction of the second proximal phalanx is noted consistent with acute osteomyelitis. Electronically Signed   By: Lupita RaiderJames  Green Jr, M.D.   On: 04/13/2015 19:47    Microbiology: No results found for this or any previous visit (from the past 240 hour(s)).   Labs: Basic Metabolic Panel:  Recent Labs Lab 04/27/15 0500 04/28/15 0401 04/29/15 0418 04/30/15 0504 05/01/15 0424 05/03/15 0518  NA 140 141 142 143 142 146*  K 3.5 3.2* 3.3* 2.9* 2.7* 4.1  CL 101 102 103 103 103 109  CO2 25 25 26 24 25 22   GLUCOSE 101* 83 76 80 86 85  BUN 36* 40* 44* 47* 23* 32*  CREATININE 6.40* 7.53* 8.36* 8.95* 5.61* 7.14*  CALCIUM 8.3* 8.5* 8.7* 8.4* 8.5* 8.5*  PHOS 5.9* 6.7* 7.9* 7.2*  --   --    Liver Function Tests:  Recent Labs Lab 04/28/15 0401 04/29/15 0418 04/30/15 0504 05/01/15 0424 05/03/15 0518  AST  --   --   --  14* 13*  ALT  --   --   --  15* 14*  ALKPHOS  --   --   --  46 38  BILITOT  --   --   --  0.6 0.6  PROT  --   --   --  5.9* 6.0*  ALBUMIN 1.8* 1.8* 1.7* 1.7* 1.8*   No results for input(s): LIPASE, AMYLASE in the last 168 hours. No results for input(s): AMMONIA in the last 168 hours. CBC:  Recent Labs Lab 04/29/15 0418 04/30/15 0504 05/01/15 0424 05/02/15 0526 05/03/15 0518  WBC 17.6* 18.9* 14.9* 13.8* 15.0*  HGB 8.3* 8.2* 8.3* 8.4* 8.3*  HCT 27.0* 26.0* 26.1* 27.4* 27.4*  MCV 79.4 78.5 79.1 80.1 80.8  PLT 263 253 224 237 225   Cardiac  Enzymes: No results for input(s): CKTOTAL, CKMB, CKMBINDEX, TROPONINI in the last 168 hours. BNP: BNP (last 3 results) No results for input(s): BNP in the last 8760 hours.  ProBNP (last 3 results) No results for input(s): PROBNP in the last 8760 hours.  CBG:  Recent Labs Lab 05/02/15 1623 05/02/15 2108 05/03/15 0018 05/03/15 0433 05/03/15 0720  GLUCAP 109* 66 143* 83 78       Signed:  Kenosha Doster MD.  [facility] 05/03/2015, 3:27 PM

## 2015-05-03 NOTE — Clinical Social Work Note (Signed)
Craig [name] medically stable for discharge and will [facility]. Discharge paperwork transmitted to facility and patient will be transported by ambulance. Patient's friend, Kathlen BrunswickWilliam Coleman, [phone] contacted and message left re: discharge.  Genelle BalVanessa Jerame Hedding, MSW, LCSW Licensed Clinical Social Worker Clinical Social Work Department [facility] [phone]

## 2015-05-03 NOTE — Progress Notes (Signed)
Attempted to call report to staff [facility] in regards to [facility]'s [facility]. Called the facility twice; once at 1754 and again at 1802 each time there was no answer. Pt will leave facility via stretcher by ambulance service.

## 2015-05-03 NOTE — Progress Notes (Signed)
[facility] ROUNDING NOTE   Subjective:   Interval History:  No complaints  States appetite is fair  No diarrhea  Objective:  Vital signs in last 24 hours:  Temp:  [98 F (36.7 C)-98.7 F (37.1 C)] 98 F (36.7 C) (04/20 1000) Pulse Rate:  [68-79] 68 (04/20 1000) Resp:  [18-20] 18 (04/20 1000) BP: (128-141)/(45-55) 137/45 mmHg (04/20 1000) SpO2:  [98 %-100 %] 98 % (04/20 1000)  Weight change:  Filed Weights   04/29/15 2131 04/30/15 0705 04/30/15 1115  Weight: 185 lb 3 oz (84 kg) 185 lb 3 oz (84 kg) 182 lb 15.7 oz (83 kg)    Intake/Output: I/O last 3 completed shifts: In: 880 [P.O.:630; IV Piggyback:250] Out: 1650 [Urine:800; Stool:850]   Intake/Output this shift:  Total I/O In: 120 [P.O.:120] Out: -   CVS- RRR RS- CTA  Oxygen [place] ABD- BS present soft non-distended EXT- no edema   Basic Metabolic Panel:  Recent Labs Lab 04/27/15 0500 04/28/15 0401 04/29/15 0418 04/30/15 0504 05/01/15 0424 05/03/15 0518  NA 140 141 142 143 142 146*  K 3.5 3.2* 3.3* 2.9* 2.7* 4.1  CL 101 102 103 103 103 109  CO2 GLUCOSE 101* 83 76 80 86 85  BUN 36* 40* 44* 47* 23* 32*  CREATININE 6.40* 7.53* 8.36* 8.95* 5.61* 7.14*  CALCIUM 8.3* 8.5* 8.7* 8.4* 8.5* 8.5*  PHOS 5.9* 6.7* 7.9* 7.2*  --   --     Liver Function Tests:  Recent Labs Lab 04/28/15 0401 04/29/15 0418 04/30/15 0504 05/01/15 0424 05/03/15 0518  AST  --   --   --  14* 13*  ALT  --   --   --  15* 14*  ALKPHOS  --   --   --  46 38  BILITOT  --   --   --  0.6 0.6  PROT  --   --   --  5.9* 6.0*  ALBUMIN 1.8* 1.8* 1.7* 1.7* 1.8*   No results for input(s): LIPASE, AMYLASE in the last 168 hours. No results for input(s): AMMONIA in the last 168 hours.  CBC:  Recent Labs Lab 04/29/15 0418 04/30/15 0504 05/01/15 0424 05/02/15 0526 05/03/15 0518  WBC 17.6* 18.9* 14.9* 13.8* 15.0*  HGB 8.3* 8.2* 8.3* 8.4* 8.3*  HCT 27.0* 26.0* 26.1* 27.4* 27.4*  MCV 79.4 78.5 79.1 80.1  80.8  PLT 263 253 224 237 225    Cardiac Enzymes: No results for input(s): CKTOTAL, CKMB, CKMBINDEX, TROPONINI in the last 168 hours.  BNP: Invalid input(s): POCBNP  CBG:  Recent Labs Lab 05/02/15 1623 05/02/15 2108 05/03/15 0018 05/03/15 0433 05/03/15 0720  GLUCAP 109* 66 143* 83 78    Microbiology: Results for orders placed or performed during the hospital encounter of 04/13/15  Culture, blood (Routine X 2) w Reflex to ID Panel     Status: None   Collection Time: 04/14/15  4:11 AM  Result Value Ref Range Status   Specimen Description BLOOD LEFT ARM  Final   Special Requests BOTTLES DRAWN AEROBIC ONLY  Final   Culture NO GROWTH 5 DAYS  Final   Report Status 04/19/2015 FINAL  Final  Culture, blood (Routine X 2) w Reflex to ID Panel     Status: None   Collection Time: 04/14/15  4:16 AM  Result Value Ref Range Status   Specimen Description BLOOD LEFT HAND  Final   Special Requests BOTTLES DRAWN AEROBIC ONLY  5ML  Final   Culture NO GROWTH 5 DAYS  Final   Report Status 04/19/2015 FINAL  Final  Urine culture     Status: None   Collection Time: 04/14/15  5:16 AM  Result Value Ref Range Status   Specimen Description URINE, SUPRAPUBIC  Final   Special Requests NONE  Final   Culture   Final    >=100,000 COLONIES/mL YEAST 40,000 COLONIES/ml PSEUDOMONAS AERUGINOSA    Report Status 04/16/2015 FINAL  Final   Organism ID, Bacteria PSEUDOMONAS AERUGINOSA  Final      Susceptibility   Pseudomonas aeruginosa - MIC*    CEFTAZIDIME 4 SENSITIVE Sensitive     CIPROFLOXACIN <=0.25 SENSITIVE Sensitive     GENTAMICIN 4 SENSITIVE Sensitive     IMIPENEM <=0.25 SENSITIVE Sensitive     PIP/TAZO <=4 SENSITIVE Sensitive     CEFEPIME 4 SENSITIVE Sensitive     * 40,000 COLONIES/ml PSEUDOMONAS AERUGINOSA  MRSA PCR Screening     Status: Abnormal   Collection Time: 04/14/15 10:03 AM  Result Value Ref Range Status   MRSA by PCR POSITIVE (A) NEGATIVE Final    Comment:        The  GeneXpert MRSA Assay (FDA approved for NASAL specimens only), is one component of a comprehensive MRSA colonization surveillance program. It is not intended to diagnose MRSA infection nor to guide or monitor treatment for MRSA infections. RESULT CALLED TO, READ BACK BY AND VERIFIED WITH: RN Sherlean FootJ CLEAVER AT 1220 [id number] MARTINB     Coagulation Studies: No results for input(s): LABPROT, INR in the last 72 hours.  Urinalysis: No results for input(s): COLORURINE, LABSPEC, PHURINE, GLUCOSEU, HGBUR, BILIRUBINUR, KETONESUR, PROTEINUR, UROBILINOGEN, NITRITE, LEUKOCYTESUR in the last 72 hours.  Invalid input(s): APPERANCEUR    Imaging: No results found.   Medications:     . amiodarone  200 mg Oral Daily  . amLODipine  5 mg Oral Daily  . carvedilol  6.25 mg Oral BID WC  . feeding supplement  1 Container Oral TID BM  . feeding supplement (PRO-STAT SUGAR FREE 64)  30 mL Oral TID  . polyethylene glycol  17 g Oral Daily  . sodium chloride flush  3 mL Intravenous Q12H   acetaminophen **OR** acetaminophen, atropine, diazepam, HYDROmorphone, ipratropium-albuterol, sodium chloride flush, sodium chloride flush  Assessment/ Plan:  1. Renal- baseline renal function normal. Developed AKI the setting of foot ulcer/ostial and supratherapeutic vancomycin levels. He is now nonoliguric. He is not a candidate for long-term dialysis support, however, during the course of this hospitalization doing dialysis PRN. Had HD on 4/7 , 4/8 and 4/11- now watch and do PRN - No dialysis today patient NOT undergoing amputation, making aggressive therapy inappropriate at this time. Patient seems to be close to DC per Palliative note today. DC to SNF w/ Hospice. Will sign off. 2. Foot ulcer/osteo- medical management for now. However, in conversations with palliative care patient has consented to amputation if absolutely necessary- certainly not getting any better 3. Anemia- status post Feraheme as well as  transfusions- most recent transfusion on 4/8. Have added ESA- last hgb 8.3 4. HTN/volume- is nonoliguric . On Norvasc, Coreg - BP adequate- minimal UF as am hoping renal function will recover 5. Hypokalemia- 4 K bath with HD as well as PRN repletion    LOS: 20 Karcyn Menn @TODAY @12 :44 PM

## 2015-05-03 NOTE — Progress Notes (Signed)
Patient complaining of back pain.  Tylenol PRN was given earlier, but patient stated that it is ineffective.  Notified Schorr, NP, regarding situation.  Awaiting orders.  Will continue to monitor.

## 2015-05-03 NOTE — Progress Notes (Signed)
Patient ID: Craig GuerinWilliam Constancio, male   DOB: 12/08/1945, 70 y.o.   MRN: [id number] Patient is seen in follow-up for dry gangrene right lower extremity with severe peripheral vascular disease. Again recommended with the patient to proceed with an above-the-knee amputation. Discussed risks of sepsis with the gangrenous changes to the right lower extremity. Patient states he understands patient states he refuses to proceed with surgery at this time. I discussed that this infection could be life-threatening. Patient states he understands. I will follow-up as needed.

## 2015-05-03 NOTE — Progress Notes (Signed)
Attempted to give pt his morning dose of Coreg and Synthroid but patient refuses. Educated pt on what the medication were used for and pt continues to refuse. Pt states "I'm not taking either one of those pills." Tried to convince pt to take meds again and pt states "I said I don't want them." Also attempted to do a morning assessment on the patient and refused that too. Pt states "you can look but don't touch." explained to pt that I needed to touch pt to be able to correctly assess him and pt still refuses. Unable to complete morning assessment.

## 2015-05-04 ENCOUNTER — Telehealth: Payer: Self-pay | Admitting: Internal Medicine

## 2015-05-04 NOTE — Telephone Encounter (Signed)
Possible re-admission to facility  - This is a patient you were seeing at [facility] fu is needed IF patient was re-admitted to facility upon discharge. Hospital discharge **05/03/15**

## 2015-05-04 NOTE — Progress Notes (Signed)
[facility]. All belongings sent with pt.   Feliciana RossettiLaura Alben Jepsen, RN, BSN

## 2015-05-04 NOTE — Progress Notes (Signed)
This encounter was created in error - please disregard.

## 2015-05-07 ENCOUNTER — Encounter: Payer: Self-pay | Admitting: Adult Health

## 2015-05-07 ENCOUNTER — Non-Acute Institutional Stay (SKILLED_NURSING_FACILITY): Payer: Medicare Other | Admitting: Adult Health

## 2015-05-07 DIAGNOSIS — N186 End stage renal disease: Secondary | ICD-10-CM

## 2015-05-07 DIAGNOSIS — R627 Adult failure to thrive: Secondary | ICD-10-CM | POA: Diagnosis not present

## 2015-05-07 DIAGNOSIS — I7025 Atherosclerosis of native arteries of other extremities with ulceration: Secondary | ICD-10-CM | POA: Diagnosis not present

## 2015-05-07 NOTE — Progress Notes (Signed)
Patient ID: Craig [name], male   DOB: 15-Apr-1945, 70 y.o.   MRN: [id number]   Facility: [facility]       No Known Allergies  Chief Complaint  Patient presents with  . Hospitalization Follow-up    Hospital Follow up    HPI:  He is a long term resident of this facility being seen after bein hospitalized for his right foot osteomyelitis/dry gangrene. He did not respond to abt given here; vascular recommended amputation of right toe; which he declined. His status continued to decline and he was sent to the hospital for further interventions. He was found to be in renal failure; he was started on hemodialysis. He did decline this therapy. He declined the aka that was recommended. His desire to be kept comfortable at this time and for hospice care.    Past Medical History  Diagnosis Date  . COPD (chronic obstructive pulmonary disease) ([place])   . Hypertension   . Myocardial infarction ([place])   . A-fib ([place])   . GERD (gastroesophageal reflux disease)   . Hepatitis B 04/27/2013  . Dysphagia 04/27/2013  . Diverticulosis 03/05/2013  . Sepsis ([place]) 04/26/2013  . Alcoholic cirrhosis of liver without ascites ([place])   . Coronary artery disease   . Thyroid disease   . Depression   . Urinary retention     Past Surgical History  Procedure Laterality Date  . Colostomy    . Peg tube placement    . Peg tube removal      VITAL SIGNS BP 124/76 mmHg  Pulse 78  Temp(Src) 98.8 F (37.1 C) (Oral)  Resp 18  Ht _0  (1.753 m)  Wt 188 lb (85.276 kg)  BMI 27.75 kg/m2  SpO2 94%  Patient's Medications  New Prescriptions   No medications on file  Previous Medications   DIAZEPAM (VALIUM) 1 MG/ML SOLUTION    Take 2.5 mLs (2.5 mg total) by mouth every 8 (eight) hours as needed for anxiety, muscle spasms or sedation.   MORPHINE (ROXANOL) 20 MG/ML CONCENTRATED SOLUTION    Take 0.2 mLs (4 mg total) by mouth every 2 (two) hours as needed for severe pain or breakthrough pain.  Modified Medications     No medications on file  Discontinued Medications   No medications on file     SIGNIFICANT DIAGNOSTIC EXAMS  12-16-13: chest x-ray; right lower lobe atelectasis  08-26-14: s/p foley placement: Placement of 12 French suprapubic bladder drainage catheter. Urine return was cloudy and a sample of urine was sent for urinalysis and culture.  10-20-14: kub: non-specific gas pattern.   10-21-14: ct of abdomen and pelvis: 1. Focal collection of fluid and trace air at the anterior left mid abdominal wall, measuring 8.1 x 2.1 x 5.3 cm, with peripheral enhancement, compatible with an abscess. This appears to be at the prior site of a G-tube placement, as the stomach is tamped up to the abdominal wall underlying the abscess. This may still be contiguous with the stomach. 2. Prominent bronchiectasis at the lower lung lobes, with patchy bibasilar airspace opacities. This may reflect an atypical infectious process. Aspiration cannot be excluded. Trace right pleural effusion seen. 3. Left lower quadrant colostomy site is unremarkable in appearance. Hartmann's pouch is within normal limits. 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 Mild diverticulosis along the distal transverse, descending and proximal sigmoid colon, without evidence of diverticulitis. 4. Small bilateral renal cysts seen. 5. Diffuse calcification along the abdominal aorta  and its branches. 6. 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  02-26-15: kub: mild ileus  03-01-15: KUB: at least a partial small bowel obstruction. Is worse.   03-01-15: ct of abdomen and pelvis: 1. Positive for small bowel obstruction with a focal transition points in the right lower quadrant likely secondary to underlying adhesions. No evidence of significant inflammation, bowel ischemic change or perforation. 2. Surgical  changes of prior sigmoidectomy with Hartmann's pouch and left lower quadrant diverting colostomy. 3. Parastomal hernia containing omental fat and a short segment of small bowel. No evidence of mechanical obstruction at this location. 4. Bilateral lower lobe bronchiectasis with areas of partial bronchial impaction, atelectasis and pleural parenchymal scarring. 5. Diverticulosis of the residual left colon without evidence of active diverticulitis. 6. 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  03-05-15: kub: 1. Interim removal of NG tube. 2. Persistent dilated loops of small bowel are noted. Colonic gas pattern is normal. No free air   04-13-15: chest x-ray: Right-sided PICC line is noted with distal tip in expected position of the SVC  04-13-15: right foot x-ray: Lytic destruction of the second proximal phalanx is noted consistent with acute osteomyelitis.   04-14-15: renal ultrasound: Echogenic renal parenchyma bilaterally consistent with medical renal disease. Minimally complicated RIGHT renal cyst. No additional mass or hydronephrosis grossly visualized.   04-15-15: NM pulmonary perfusion: Diffuse marked impairment in ventilation throughout both lungs compatible with parenchymal lung disease. Much less severely impaired perfusion in both lungs with a few small subsegmental perfusion defects.  Findings are most suggestive of parenchymal lung disease and represent a low probability for pulmonary embolism.  04-20-15: chest x-ray: Central catheter tips are in the superior vena cava. No pneumothorax. Mild bibasilar atelectasis. No edema or consolidation. Stable cardiac silhouette. Extensive atherosclerotic calcification in the aorta.    LABS REVIEWED:    08-14-14: wbc 8.8; hgb 8.2; hct 28.5; mcv 71.8; plt 250; glucose 72; bun 13; creat 1.13; k+ 4.7; na++140;  liver normal albumin 3.2; phos 3.3; mag 1.6; tsh 0.298  08-26-14: wbc 11.0; hgb 7.7; hct 27.1; mcv 74.5. ;plt 297; glucose 124; bun 19; creat 1.21; k+3.6; na++141; liver normal albumin 2.9; urine culture: gram neg rods.  10-02-14: tsh 0.236  10-21-14 :wbc 15.7; hgb 7.6; hct 25.4; mcv 67.9; plt 402; glucose 87; bun 19; creat 1.14; k+ 3.8; na++135; liver normal albumin 2.7; blood culture X2: no growth; HIV; nr 10-22-14: wbc 8.2; hgb 7.0; hct 23.1; mcv 67.7; plt 365; glucose 83; bun 11; creat 1.06; k+ 3.6; n++139; tsh 0.088 10-23-14: urine culture: klebsiella pneumonia proteus mirabilis: septra 10-25-14; wbc 9.8; hgb 9.8; hct 31.3; mcv 72.8; plt 396; glucose 72; bun 9; creat 0.85; k+ 3.7; na++139; tsh 0.267 12-08-14: tsh 0.195; free T4: 1.62 (normal)  02-26-15: wbc 13,4 hgb 10,6l hct 34.5 mcv 73.2; plt 336; glucose 66; bun 36; creat 1.34; k+ 5.1; na++ 134; liver normal albumin 3.4; amylase 61; lipase 24; U/A +  03-01-15: wbc 16.0; hgb 10.4; hct 33.8; mcv 71.5; ;plt 335; glucose 99; bun 35; creat 1.41; k+ 3.5; na++136; ast 13; alt 11; alk phos 78; albumin 2.8 03-02-15: blood culture: no growth; urine culture: minimal: providencia stuartii; pseudomonas aeruginosa; proteus mirabilis 02-21-15: wbc 12.5; hgb 8.6; hct 29.5; mcv 73.9; plt 276; glucose 98; bun 7; creat 0.92;  creat 4.5;na++138; vit B12: 1457; folate 47.4; iron 7; tibc 235; mag 1.9 pre-albumin 68  03-06-15: glucose 92; bun 7; creat 1.04; k+ 4.8; na++139  03-14-15: wbc 8.6; hgb 8.5; hct 28.1; mcv 72.6; plt 297; glucose 70; bun 12; creat 0.91; k+ 4.5; na++140 pre-albumin 15 04-13-15: wbc 12.6; hgb 8.1; hct 25.9; mcv 71.6; plt 309; glucose 74; bun 40; creat 8.44; k+ 4.3; na++141; liver normal albumin 2.3; sed rate 133; CRP 18.7 04-26-15: wbc 13.1; hgb 8.1; hct 25.9; mcv 78.0; plt 187; glucose 71; bun 33; creat 5.19; k+ 3.7; na++138; phos 5.9; albumin 1.7 05-03-15: wbc 15.0; hgb 8.3; hct 27.4; mcv 80.8; plt 225; glucose 85; bun 32; creat 7.14; k+ 4.1; na++146;  liver normal albumin 1.8     Review of Systems  Unable to perform ROS: medical condition       Physical Exam Constitutional: frail. No distress.  Eyes: Conjunctivae are normal.  Neck: Neck supple. No JVD present. No thyromegaly present.  Cardiovascular: Normal rate, regular rhythm and post tibial and pedal pulses non-palpable    Respiratory: Effort normal and breath sounds normal. No respiratory distress. He has no wheezes.  GI: abdomen soft nontender; no distension; bowel sounds present in all four quads  Has left lower quad colostomy   Has s/p foley  Musculoskeletal: He exhibits no edema.  Does not move extremities   Lymphadenopathy:    He has no cervical adenopathy.  Neurological: lethargic   Skin: Skin is warm and dry. He is not diaphoretic.  Right foot with necrotic areas present.      ASSESSMENT/ PLAN:  1. 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 Failure to thrive 3. End stage renal disease  Will continue roxanol 5 mg every 2 hours as needed Will continue valium concentrate 2.5 mg every 8 hours as needed Will setup hospice consult  Will continue to focus his care upon comfort    Time spent with patient  50  minutes >50% time spent counseling; reviewing medical record; tests; labs; and developing future plan of care           Ok Edwards NP [facility]  Contact [phone] Monday through Friday 8am- 5pm  After hours call [phone]

## 2015-05-08 ENCOUNTER — Encounter: Payer: Self-pay | Admitting: Internal Medicine

## 2015-05-08 ENCOUNTER — Non-Acute Institutional Stay (SKILLED_NURSING_FACILITY): Payer: Medicare Other | Admitting: Internal Medicine

## 2015-05-08 DIAGNOSIS — Z978 Presence of other specified devices: Secondary | ICD-10-CM

## 2015-05-08 DIAGNOSIS — I739 Peripheral vascular disease, unspecified: Secondary | ICD-10-CM

## 2015-05-08 DIAGNOSIS — J42 Unspecified chronic bronchitis: Secondary | ICD-10-CM | POA: Diagnosis not present

## 2015-05-08 DIAGNOSIS — N186 End stage renal disease: Secondary | ICD-10-CM

## 2015-05-08 DIAGNOSIS — I7025 Atherosclerosis of native arteries of other extremities with ulceration: Secondary | ICD-10-CM

## 2015-05-08 DIAGNOSIS — Z933 Colostomy status: Secondary | ICD-10-CM | POA: Diagnosis not present

## 2015-05-08 DIAGNOSIS — Z9289 Personal history of other medical treatment: Secondary | ICD-10-CM

## 2015-05-08 DIAGNOSIS — E43 Unspecified severe protein-calorie malnutrition: Secondary | ICD-10-CM | POA: Diagnosis not present

## 2015-05-08 DIAGNOSIS — I480 Paroxysmal atrial fibrillation: Secondary | ICD-10-CM

## 2015-05-08 DIAGNOSIS — R627 Adult failure to thrive: Secondary | ICD-10-CM

## 2015-05-08 DIAGNOSIS — G8929 Other chronic pain: Secondary | ICD-10-CM | POA: Diagnosis not present

## 2015-05-08 DIAGNOSIS — Z960 Presence of urogenital implants: Secondary | ICD-10-CM

## 2015-05-08 NOTE — Progress Notes (Signed)
Patient ID: Craig [name], male   DOB: [birth year], 70 y.o.   MRN: [id number]    HISTORY AND PHYSICAL   DATE: 05/08/15  Location:  [place] of Service: SNF [phone])   Extended Emergency Contact Information Primary Emergency Contact: Margaretmary Bayley [place] Phone: [phone] Relation: Friend  Advanced Directive information  DNR  Chief Complaint  Patient presents with  . Readmit To SNF    HPI:  70 yo male long term resident seen today as a readmission into SNF following hospital stay for sepsis, UTi, severe protein calorie malnutrition, AKI with ESRD, FTT, abscess right 2nd toe with osteomyelitis, PAD. Vanco level on admission 146. Cr 8.6 on admission and req'd HD. He continued to have worsening Cr and became uremic. Nephrology followed pt and recommended long term HD. Right foot dx dry gangrene and Ortho recommended AKA. Pt declined both long term HD and right AKA. Palliative care consulted and pt agreed to comfort care. Hgb 8.3 at d/c; Na 146; K+ 4.6; Cr 7.14; albumin 1.8 at d/c. He was d/c'd back to SNF with goal to be kept comfortable  Today he is nonverbal. Poor appetite and reduced po intake. He is awaiting hospice eval. Currently on prn roxanol 5 mg every 2 hours and valium concentrate 2.5 mg every 8 hours as needed. All other meds were  stopped at d/c.   Past Medical History  Diagnosis Date  . COPD (chronic obstructive pulmonary disease) ([place])   . Hypertension   . Myocardial infarction ([place])   . A-fib ([place])   . GERD (gastroesophageal reflux disease)   . Hepatitis B 04/27/2013  . Dysphagia 04/27/2013  . Diverticulosis 03/05/2013  . Sepsis ([place]) 04/26/2013  . Alcoholic cirrhosis of liver without ascites ([place])   . Coronary artery disease   . Thyroid disease   . Depression   . Urinary retention     Past Surgical History  Procedure Laterality Date  . Colostomy    . Peg tube placement    . Peg tube removal       Patient Care Team: Gildardo Cranker, DO as PCP - General (Internal Medicine) Gerlene Fee, NP as Nurse Practitioner ([place]) [place] ([place])  Social History   Social History  . Marital Status: Unknown    Spouse Name: N/A  . Number of Children: N/A  . Years of Education: N/A   Occupational History  . Not on file.   Social History Main Topics  . Smoking status: Former Research scientist (life sciences)  . Smokeless tobacco: Not on file  . Alcohol Use: No  . Drug Use: No  . Sexual Activity: Not Currently   Other Topics Concern  . Not on file   Social History Narrative     reports that he has quit smoking. He does not have any smokeless tobacco history on file. He reports that he does not drink alcohol or use illicit drugs.  No family history on file. No family status information on file.    Immunization History  Administered Date(s) Administered  . PPD Test 03/06/2015    No Known Allergies  Medications: Patient's Medications  New Prescriptions   No medications on file  Previous Medications   DIAZEPAM (VALIUM) 1 MG/ML SOLUTION    Take 2.5 mLs (2.5 mg total) by mouth every 8 (eight) hours as needed for anxiety, muscle spasms or sedation.   MORPHINE (ROXANOL) 20 MG/ML CONCENTRATED SOLUTION  Take 0.2 mLs (4 mg total) by mouth every 2 (two) hours as needed for severe pain or breakthrough pain.  Modified Medications   No medications on file  Discontinued Medications   No medications on file    Review of Systems  Unable to perform ROS: Patient nonverbal    Filed Vitals:   05/08/15 1154  BP: 129/78  Pulse: 78  Temp: 97.8 F (36.6 C)  Weight: 188 lb (85.276 kg)  SpO2: 94%   Body mass index is 27.75 kg/(m^2).  Physical Exam  Constitutional: He appears lethargic.  Temporal wasting; Frail appearing in NAD, lying in bed. [place] O2 intact  HENT:  Mouth/Throat: Oropharynx is clear and moist.  MMM  Eyes: Pupils are equal, round,  and reactive to light. No scleral icterus.  Neck: Neck supple. Carotid bruit is not present.  Cardiovascular: Normal rate and regular rhythm.  Exam reveals no gallop and no friction rub.   Murmur (1/6 SEM) heard. Pulses:      Dorsalis pedis pulses are 0 on the right side, and 0 on the left side.       Posterior tibial pulses are 0 on the right side, and 0 on the left side.  no distal LE swelling. No calf TTP  Pulmonary/Chest: Effort normal and breath sounds normal. He has no wheezes. He has no rales. He exhibits no tenderness.  Abdominal: Soft. Bowel sounds are normal. He exhibits no distension, no abdominal bruit, no pulsatile midline mass and no mass. There is no tenderness. There is no rebound and no guarding.  Colostomy intact  Genitourinary:  Foley cath DTG clear yellow urine  Musculoskeletal: He exhibits edema and tenderness.  B/l calf atrophy. Small and large deformities.  Lymphadenopathy:    He has no cervical adenopathy.  Neurological: He appears lethargic.  Skin: Skin is warm and dry. No rash noted.  Right 2nd toe dry gangrene  Psychiatric: He has a normal mood and affect. His behavior is normal.     Labs reviewed: Admission on 04/13/2015, Discharged on 05/03/2015  No results displayed because visit has over 200 results.  CBC Latest Ref Rng 05/03/2015 05/02/2015 05/01/2015  WBC 4.0 - 10.5 K/uL 15.0(H) 13.8(H) 14.9(H)  Hemoglobin 13.0 - 17.0 g/dL 8.3(L) 8.4(L) 8.3(L)  Hematocrit 39.0 - 52.0 % 27.4(L) 27.4(L) 26.1(L)  Platelets 150 - 400 K/uL 225 237 224    CMP Latest Ref Rng 05/03/2015 05/01/2015 04/30/2015  Glucose 65 - 99 mg/dL 85 86 80  BUN 6 - 20 mg/dL 32(H) 23(H) 47(H)  Creatinine 0.61 - 1.24 mg/dL 7.14(H) 5.61(H) 8.95(H)  Sodium 135 - 145 mmol/L 146(H) 142 143  Potassium 3.5 - 5.1 mmol/L 4.1 2.7(LL) 2.9(L)  Chloride 101 - 111 mmol/L 109 103 103  CO2 22 - 32 mmol/L _0 Calcium 8.9 - 10.3 mg/dL 8.5(L) 8.5(L) 8.4(L)  Total Protein 6.5 - 8.1 g/dL 6.0(L) 5.9(L) -   Total Bilirubin 0.3 - 1.2 mg/dL 0.6 0.6 -  Alkaline Phos 38 - 126 U/L 38 46 -  AST 15 - 41 U/L 13(L) 14(L) -  ALT 17 - 63 U/L 14(L) 15(L) -      Nursing Home on 04/03/2015  Component Date Value Ref Range Status  . HM Diabetic Foot Exam 03/08/2015 Done   Final   Althea Charon  . PREALBUMIN 03/14/2015 15   Final  . Hemoglobin 03/14/2015 8.5* 13.5 - 17.5 g/dL Final  . HCT 03/14/2015 28* 41 - 53 % Final  . Platelets 03/14/2015 297  150 - 399 K/L Final  . WBC 03/14/2015 8.6   Final  . Glucose 03/14/2015 70   Final  . BUN 03/14/2015 12  4 - 21 mg/dL Final  . Creatinine 03/14/2015 0.9  0.6 - 1.3 mg/dL Final  . Potassium 03/14/2015 4.5  3.4 - 5.3 mmol/L Final  . Sodium 03/14/2015 140  137 - 147 mmol/L Final  Nursing Home on 03/13/2015  Component Date Value Ref Range Status  . WBC 02/26/2015 13.4   Final  . Glucose 02/26/2015 66   Final  . BUN 02/26/2015 36* 4 - 21 mg/dL Final  . Creatinine 02/26/2015 1.3  .6 - 1.3 mg/dL Final  . Potassium 02/26/2015 5.1  3.4 - 5.3 mmol/L Final  . Sodium 02/26/2015 134* 137 - 147 mmol/L Final  . Bilirubin, Total 02/26/2015 0.5   Final  . WBC 03/01/2015 16.2   Final  Admission on 03/01/2015, Discharged on 03/06/2015  Component Date Value Ref Range Status  . Sodium 03/01/2015 136  135 - 145 mmol/L Final  . Potassium 03/01/2015 3.5  3.5 - 5.1 mmol/L Final  . Chloride 03/01/2015 95* 101 - 111 mmol/L Final  . CO2 03/01/2015 30  22 - 32 mmol/L Final  . Glucose, Bld 03/01/2015 99  65 - 99 mg/dL Final  . BUN 03/01/2015 35* 6 - 20 mg/dL Final  . Creatinine, Ser 03/01/2015 1.41* 0.61 - 1.24 mg/dL Final  . Calcium 03/01/2015 9.0  8.9 - 10.3 mg/dL Final  . Total Protein 03/01/2015 8.4* 6.5 - 8.1 g/dL Final  . Albumin 03/01/2015 2.8* 3.5 - 5.0 g/dL Final  . AST 03/01/2015 13* 15 - 41 U/L Final  . ALT 03/01/2015 11* 17 - 63 U/L Final  . Alkaline Phosphatase 03/01/2015 78  38 - 126 U/L Final  . Total Bilirubin 03/01/2015 0.5  0.3 - 1.2 mg/dL Final  . GFR calc  non Af Amer 03/01/2015 50* >60 mL/min Final  . GFR calc Af Amer 03/01/2015 58* >60 mL/min Final   Comment: (NOTE) The eGFR has been calculated using the CKD EPI equation. This calculation has not been validated in all clinical situations. eGFR's persistently <60 mL/min signify possible Chronic Kidney Disease.   . Anion gap 03/01/2015 11  5 - 15 Final  . Lipase 03/01/2015 32  11 - 51 U/L Final  . WBC 03/01/2015 16.0* 4.0 - 10.5 K/uL Final  . RBC 03/01/2015 4.73  4.22 - 5.81 MIL/uL Final  . Hemoglobin 03/01/2015 10.4* 13.0 - 17.0 g/dL Final  . HCT 03/01/2015 33.8* 39.0 - 52.0 % Final  . MCV 03/01/2015 71.5* 78.0 - 100.0 fL Final  . MCH 03/01/2015 22.0* 26.0 - 34.0 pg Final  . MCHC 03/01/2015 30.8  30.0 - 36.0 g/dL Final  . RDW 03/01/2015 17.4* 11.5 - 15.5 % Final  . Platelets 03/01/2015 335  150 - 400 K/uL Final  . Neutrophils Relative % 03/01/2015 74   Final  . Lymphocytes Relative 03/01/2015 14   Final  . Monocytes Relative 03/01/2015 10   Final  . Eosinophils Relative 03/01/2015 2   Final  . Basophils Relative 03/01/2015 0   Final  . Neutro Abs 03/01/2015 11.9* 1.7 - 7.7 K/uL Final  . Lymphs Abs 03/01/2015 2.2  0.7 - 4.0 K/uL Final  . Monocytes Absolute 03/01/2015 1.6* 0.1 - 1.0 K/uL Final  . Eosinophils Absolute 03/01/2015 0.3  0.0 - 0.7 K/uL Final  . Basophils Absolute 03/01/2015 0.0  0.0 - 0.1 K/uL Final  . RBC Morphology 03/01/2015  POLYCHROMASIA PRESENT   Final  . Smear Review 03/01/2015 LARGE PLATELETS PRESENT   Final  . Sodium 03/02/2015 139  135 - 145 mmol/L Final  . Potassium 03/02/2015 3.5  3.5 - 5.1 mmol/L Final  . Chloride 03/02/2015 101  101 - 111 mmol/L Final  . CO2 03/02/2015 30  22 - 32 mmol/L Final  . Glucose, Bld 03/02/2015 78  65 - 99 mg/dL Final  . BUN 03/02/2015 33* 6 - 20 mg/dL Final  . Creatinine, Ser 03/02/2015 1.45* 0.61 - 1.24 mg/dL Final  . Calcium 03/02/2015 8.6* 8.9 - 10.3 mg/dL Final  . GFR calc non Af Amer 03/02/2015 48* >60 mL/min Final  . GFR  calc Af Amer 03/02/2015 56* >60 mL/min Final   Comment: (NOTE) The eGFR has been calculated using the CKD EPI equation. This calculation has not been validated in all clinical situations. eGFR's persistently <60 mL/min signify possible Chronic Kidney Disease.   . Anion gap 03/02/2015 8  5 - 15 Final  . WBC 03/02/2015 20.0* 4.0 - 10.5 K/uL Final  . RBC 03/02/2015 4.12* 4.22 - 5.81 MIL/uL Final  . Hemoglobin 03/02/2015 9.4* 13.0 - 17.0 g/dL Final  . HCT 03/02/2015 30.2* 39.0 - 52.0 % Final  . MCV 03/02/2015 73.3* 78.0 - 100.0 fL Final  . MCH 03/02/2015 22.8* 26.0 - 34.0 pg Final  . MCHC 03/02/2015 31.1  30.0 - 36.0 g/dL Final  . RDW 03/02/2015 18.1* 11.5 - 15.5 % Final  . Platelets 03/02/2015 310  150 - 400 K/uL Final  . Specimen Description 03/02/2015 BLOOD RIGHT ANTECUBITAL   Final  . Special Requests 03/02/2015 IN PEDIATRIC BOTTLE 2CC   Final  . Culture 03/02/2015 NO GROWTH 5 DAYS   Final  . Report Status 03/02/2015 03/07/2015 FINAL   Final  . Specimen Description 03/02/2015 BLOOD LEFT ANTECUBITAL   Final  . Special Requests 03/02/2015 BOTTLES DRAWN AEROBIC ONLY 5CC   Final  . Culture  Setup Time 03/02/2015    Final                   Value:GRAM POSITIVE COCCI IN CLUSTERS AEROBIC BOTTLE ONLY CRITICAL RESULT CALLED TO, READ BACK BY AND VERIFIED WITH: D Nifong,RN AT 1628 03/02/15 BY L BENFIELD   . Culture 03/02/2015    Final                   Value:STAPHYLOCOCCUS SPECIES (COAGULASE NEGATIVE) THE SIGNIFICANCE OF ISOLATING THIS ORGANISM FROM A SINGLE SET OF BLOOD CULTURES WHEN MULTIPLE SETS ARE DRAWN IS UNCERTAIN. PLEASE NOTIFY THE MICROBIOLOGY DEPARTMENT WITHIN ONE WEEK IF SPECIATION AND SENSITIVITIES ARE REQUIRED.   Marland Kitchen Report Status 03/02/2015 03/04/2015 FINAL   Final  . MRSA by PCR 03/02/2015 NEGATIVE  NEGATIVE Final   Comment:        The GeneXpert MRSA Assay (FDA approved for NASAL specimens only), is one component of a comprehensive MRSA colonization surveillance program. It is  not intended to diagnose MRSA infection nor to guide or monitor treatment for MRSA infections.   . Color, Urine 03/02/2015 YELLOW  YELLOW Final  . APPearance 03/02/2015 TURBID* CLEAR Final  . Specific Gravity, Urine 03/02/2015 1.027  1.005 - 1.030 Final  . pH 03/02/2015 5.0  5.0 - 8.0 Final  . Glucose, UA 03/02/2015 NEGATIVE  NEGATIVE mg/dL Final  . Hgb urine dipstick 03/02/2015 MODERATE* NEGATIVE Final  . Bilirubin Urine 03/02/2015 NEGATIVE  NEGATIVE Final  . Ketones, ur 03/02/2015 15* NEGATIVE mg/dL Final  . Protein,  ur 03/02/2015 30* NEGATIVE mg/dL Final  . Nitrite 03/02/2015 POSITIVE* NEGATIVE Final  . Leukocytes, UA 03/02/2015 MODERATE* NEGATIVE Final  . Squamous Epithelial / LPF 03/02/2015 6-30* NONE SEEN Final  . WBC, UA 03/02/2015 TOO NUMEROUS TO COUNT  0 - 5 WBC/hpf Final  . RBC / HPF 03/02/2015 0-5  0 - 5 RBC/hpf Final  . Bacteria, UA 03/02/2015 MANY* NONE SEEN Final  . Urine-Other 03/02/2015 AMORPHOUS URATES/PHOSPHATES   Final   LESS THAN 10 mL OF URINE SUBMITTED  . Specimen Description 03/02/2015 URINE, SUPRAPUBIC   Final  . Special Requests 03/02/2015 NONE   Final  . Culture 03/02/2015    Final                   Value:40,000 COLONIES/ml PROVIDENCIA STUARTII 40,000 COLONIES/ml PSEUDOMONAS AERUGINOSA 50,000 COLONIES/mL PROTEUS MIRABILIS   . Report Status 03/02/2015 03/07/2015 FINAL   Final  . Organism ID, Bacteria 03/02/2015 PROVIDENCIA STUARTII   Final  . Organism ID, Bacteria 03/02/2015 PROTEUS MIRABILIS   Final  . Organism ID, Bacteria 03/02/2015 PSEUDOMONAS AERUGINOSA   Final  . WBC 03/03/2015 12.5* 4.0 - 10.5 K/uL Final  . RBC 03/03/2015 3.99* 4.22 - 5.81 MIL/uL Final  . Hemoglobin 03/03/2015 8.6* 13.0 - 17.0 g/dL Final  . HCT 03/03/2015 29.5* 39.0 - 52.0 % Final  . MCV 03/03/2015 73.9* 78.0 - 100.0 fL Final  . MCH 03/03/2015 21.6* 26.0 - 34.0 pg Final  . MCHC 03/03/2015 29.2* 30.0 - 36.0 g/dL Final  . RDW 03/03/2015 17.9* 11.5 - 15.5 % Final  . Platelets  03/03/2015 276  150 - 400 K/uL Final  . Sodium 03/03/2015 140  135 - 145 mmol/L Final  . Potassium 03/03/2015 4.1  3.5 - 5.1 mmol/L Final  . Chloride 03/03/2015 105  101 - 111 mmol/L Final  . CO2 03/03/2015 28  22 - 32 mmol/L Final  . Glucose, Bld 03/03/2015 80  65 - 99 mg/dL Final  . BUN 03/03/2015 18  6 - 20 mg/dL Final  . Creatinine, Ser 03/03/2015 1.09  0.61 - 1.24 mg/dL Final  . Calcium 03/03/2015 8.7* 8.9 - 10.3 mg/dL Final  . GFR calc non Af Amer 03/03/2015 >60  >60 mL/min Final  . GFR calc Af Amer 03/03/2015 >60  >60 mL/min Final   Comment: (NOTE) The eGFR has been calculated using the CKD EPI equation. This calculation has not been validated in all clinical situations. eGFR's persistently <60 mL/min signify possible Chronic Kidney Disease.   . Anion gap 03/03/2015 7  5 - 15 Final  . Magnesium 03/03/2015 1.9  1.7 - 2.4 mg/dL Final  . Prealbumin 03/03/2015 6.8* 18 - 38 mg/dL Final  . Vitamin B-12 03/03/2015 1457* 180 - 914 pg/mL Final   Comment: (NOTE) This assay is not validated for testing neonatal or myeloproliferative syndrome specimens for Vitamin B12 levels.   . Folate 03/03/2015 47.4  >5.9 ng/mL Final   RESULTS CONFIRMED BY MANUAL DILUTION  . Iron 03/03/2015 7* 45 - 182 ug/dL Final  . TIBC 03/03/2015 235* 250 - 450 ug/dL Final  . Saturation Ratios 03/03/2015 3* 17.9 - 39.5 % Final  . UIBC 03/03/2015 228   Final  . Ferritin 03/03/2015 45  24 - 336 ng/mL Final  . Retic Ct Pct 03/03/2015 1.4  0.4 - 3.1 % Final  . RBC. 03/03/2015 3.99* 4.22 - 5.81 MIL/uL Final  . Retic Count, Manual 03/03/2015 55.9  19.0 - 186.0 K/uL Final  . WBC 03/04/2015 10.4  4.0 - 10.5 K/uL Final  . RBC 03/04/2015 3.80* 4.22 - 5.81 MIL/uL Final  . Hemoglobin 03/04/2015 8.5* 13.0 - 17.0 g/dL Final  . HCT 03/04/2015 28.0* 39.0 - 52.0 % Final  . MCV 03/04/2015 73.7* 78.0 - 100.0 fL Final  . MCH 03/04/2015 22.4* 26.0 - 34.0 pg Final  . MCHC 03/04/2015 30.4  30.0 - 36.0 g/dL Final  . RDW 03/04/2015  17.9* 11.5 - 15.5 % Final  . Platelets 03/04/2015 277  150 - 400 K/uL Final  . Sodium 03/04/2015 138  135 - 145 mmol/L Final  . Potassium 03/04/2015 4.5  3.5 - 5.1 mmol/L Final  . Chloride 03/04/2015 102  101 - 111 mmol/L Final  . CO2 03/04/2015 29  22 - 32 mmol/L Final  . Glucose, Bld 03/04/2015 98  65 - 99 mg/dL Final  . BUN 03/04/2015 7  6 - 20 mg/dL Final  . Creatinine, Ser 03/04/2015 0.92  0.61 - 1.24 mg/dL Final  . Calcium 03/04/2015 8.8* 8.9 - 10.3 mg/dL Final  . GFR calc non Af Amer 03/04/2015 >60  >60 mL/min Final  . GFR calc Af Amer 03/04/2015 >60  >60 mL/min Final   Comment: (NOTE) The eGFR has been calculated using the CKD EPI equation. This calculation has not been validated in all clinical situations. eGFR's persistently <60 mL/min signify possible Chronic Kidney Disease.   . Anion gap 03/04/2015 7  5 - 15 Final  . WBC 03/05/2015 10.5  4.0 - 10.5 K/uL Final  . RBC 03/05/2015 3.84* 4.22 - 5.81 MIL/uL Final  . Hemoglobin 03/05/2015 8.6* 13.0 - 17.0 g/dL Final  . HCT 03/05/2015 27.7* 39.0 - 52.0 % Final  . MCV 03/05/2015 72.1* 78.0 - 100.0 fL Final  . MCH 03/05/2015 22.4* 26.0 - 34.0 pg Final  . MCHC 03/05/2015 31.0  30.0 - 36.0 g/dL Final  . RDW 03/05/2015 17.9* 11.5 - 15.5 % Final  . Platelets 03/05/2015 328  150 - 400 K/uL Final  . Sodium 03/05/2015 135  135 - 145 mmol/L Final  . Potassium 03/05/2015 5.5* 3.5 - 5.1 mmol/L Final  . Chloride 03/05/2015 99* 101 - 111 mmol/L Final  . CO2 03/05/2015 27  22 - 32 mmol/L Final  . Glucose, Bld 03/05/2015 79  65 - 99 mg/dL Final  . BUN 03/05/2015 5* 6 - 20 mg/dL Final  . Creatinine, Ser 03/05/2015 0.94  0.61 - 1.24 mg/dL Final  . Calcium 03/05/2015 9.3  8.9 - 10.3 mg/dL Final  . GFR calc non Af Amer 03/05/2015 >60  >60 mL/min Final  . GFR calc Af Amer 03/05/2015 >60  >60 mL/min Final   Comment: (NOTE) The eGFR has been calculated using the CKD EPI equation. This calculation has not been validated in all clinical  situations. eGFR's persistently <60 mL/min signify possible Chronic Kidney Disease.   . Anion gap 03/05/2015 9  5 - 15 Final  . MRSA by PCR 03/05/2015 POSITIVE* NEGATIVE Corrected   Comment:        The GeneXpert MRSA Assay (FDA approved for NASAL specimens only), is one component of a comprehensive MRSA colonization surveillance program. It is not intended to diagnose MRSA infection nor to guide or monitor treatment for MRSA infections. CORRECTED ON 02/20 AT 2342: PREVIOUSLY REPORTED AS POSITIVE        The GeneXpert MRSA Assay (FDA approved for NASAL specimens only), is one component of a comprehensive MRSA colonization surveillance program. It is not intended to diagnose MRSA infection  nor  to guide or monitor treatment for MRSA infections. RESULT CALLED TO, READ BACK BY AND VERIFIED WITH: S ALUMBRES,RN _0  02/20/1   . Sodium 03/06/2015 139  135 - 145 mmol/L Final  . Potassium 03/06/2015 4.8  3.5 - 5.1 mmol/L Final  . Chloride 03/06/2015 99* 101 - 111 mmol/L Final  . CO2 03/06/2015 29  22 - 32 mmol/L Final  . Glucose, Bld 03/06/2015 92  65 - 99 mg/dL Final  . BUN 03/06/2015 7  6 - 20 mg/dL Final  . Creatinine, Ser 03/06/2015 1.04  0.61 - 1.24 mg/dL Final  . Calcium 03/06/2015 9.3  8.9 - 10.3 mg/dL Final  . GFR calc non Af Amer 03/06/2015 >60  >60 mL/min Final  . GFR calc Af Amer 03/06/2015 >60  >60 mL/min Final   Comment: (NOTE) The eGFR has been calculated using the CKD EPI equation. This calculation has not been validated in all clinical situations. eGFR's persistently <60 mL/min signify possible Chronic Kidney Disease.   . Anion gap 03/06/2015 11  5 - 15 Final  Nursing Home on 02/26/2015  Component Date Value Ref Range Status  . TSH 12/08/2014 0.20* 0.41 - 5.90 uIU/mL Final    Dg Chest 2 View  04/13/2015  CLINICAL DATA:  PICC line placement. EXAM: CHEST  2 VIEW COMPARISON:  March 02, 2015. FINDINGS: Stable cardiomediastinal silhouette. No pneumothorax or pleural  effusion is noted. Atherosclerosis of thoracic aorta is noted. No acute pulmonary disease is noted. Right-sided PICC line is noted with distal tip in expected position of the SVC. Bony thorax is unremarkable. IMPRESSION: Right-sided PICC line is noted with distal tip in expected position of the SVC. Electronically Signed   By: Marijo Conception, M.D.   On: 04/13/2015 19:26   US Renal  04/14/2015  CLINICAL DATA:  Acute kidney injury, hypertension, COPD, alcoholic cirrhosis, coronary artery disease post MI EXAM: RENAL / URINARY TRACT ULTRASOUND COMPLETE COMPARISON:  CT abdomen and pelvis 03/01/2015 FINDINGS: Right Kidney: Length: 12.1 cm. Normal cortical thickness. Increased cortical echogenicity. Hypoechoic nodule mid RIGHT kidney 2.0 x 2.0 x 1.9 cm, unchanged from prior CT, consistent with minimally complicated cyst. No additional mass, hydronephrosis or shadowing calcification. Left Kidney: Length: 11.6 cm. Less well visualized than RIGHT kidney due to body habitus. Normal cortical thickness. Increased cortical echogenicity. No mass, hydronephrosis or shadowing calcification grossly visualized. Bladder: Decompressed by a suprapubic catheter, unable to evaluate IMPRESSION: Echogenic renal parenchyma bilaterally consistent with medical renal disease. Minimally complicated RIGHT renal cyst. No additional mass or hydronephrosis grossly visualized. Electronically Signed   By: Lavonia Dana M.D.   On: 04/14/2015 14:46   Nm Pulmonary Perf And Vent  04/15/2015  CLINICAL DATA:  Hypoxia, COPD, hypertension, coronary artery disease post MI, alcoholic cirrhosis, former smoker EXAM: NUCLEAR MEDICINE VENTILATION - PERFUSION LUNG SCAN TECHNIQUE: Ventilation images were obtained in multiple projections using inhaled aerosol Tc-59mDTPA. Perfusion images were obtained in multiple projections after intravenous injection of Tc-950mAA. RADIOPHARMACEUTICALS:  30.8 mCi Technetium-9932mPA aerosol inhalation and 4.38 mCi Technetium-14m70mA IV COMPARISON:  None Radiographic correlation: Chest radiograph 04/15/2015; CT chest 04/26/2013 FINDINGS: Ventilation: Markedly abnormal ventilation exam with small patchy areas of diminished and absent perfusion throughout both lungs. Ventilation is most impaired lower lobes bilaterally Perfusion: Irregular perfusion throughout both lungs. Scattered small subsegmental perfusion defects largest in RIGHT middle lobe. Diminished perfusion in the posterior lungs particularly lower lobes bilaterally matching ventilation pattern. Overall, better perfusion than ventilation throughout both lungs. Chest radiograph: Bibasilar  atelectasis greater on LEFT. Significant bronchiectasis in lower lobes by prior CT. IMPRESSION: Diffuse marked impairment in ventilation throughout both lungs compatible with parenchymal lung disease. Much less severely impaired perfusion in both lungs with a few small subsegmental perfusion defects. Findings are most suggestive of parenchymal lung disease and represent a low probability for pulmonary embolism. Electronically Signed   By: Lavonia Dana M.D.   On: 04/15/2015 11:02   Dg Chest Port 1 View  04/20/2015  CLINICAL DATA:  Central catheter placement EXAM: PORTABLE CHEST 1 VIEW COMPARISON:  April 15, 2015 FINDINGS: Right jugular catheter tip is in the superior vena cava. Right peripherally inserted central catheter tip is in the superior vena cava. No pneumothorax. There is mild bibasilar atelectasis. Lungs elsewhere clear. Heart size and pulmonary vascularity are normal. No adenopathy. There is extensive atherosclerotic calcification in the aorta. IMPRESSION: Central catheter tips are in the superior vena cava. No pneumothorax. Mild bibasilar atelectasis. No edema or consolidation. Stable cardiac silhouette. Extensive atherosclerotic calcification in the aorta. Electronically Signed   By: Lowella Grip III M.D.   On: 04/20/2015 11:34   Dg Chest Port 1 View  04/15/2015  CLINICAL DATA:   Shortness of breath and decreased oxygen saturation overnight last night, COPD, hypertension and atrial fibrillation, former smoker, coronary artery disease post MI, alcoholic cirrhosis EXAM: PORTABLE CHEST 1 VIEW COMPARISON:  Portable exam 1040 hours compared to 04/13/2015 and correlated with chest CT 04/26/2013 FINDINGS: RIGHT arm PICC line tip projects over SVC. Normal heart size and pulmonary vascularity. Calcified thoracic aorta. Mild bibasilar atelectasis. Remaining lungs grossly clear. No pleural effusion or pneumothorax. Bronchiectatic changes in the lower lobes on the prior CT from 2015 are not radiographically evident. 2 metallic foreign bodies question BBs project over the mid RIGHT chest. IMPRESSION: Bibasilar atelectasis. Electronically Signed   By: Lavonia Dana M.D.   On: 04/15/2015 10:55   Dg Foot Complete Right  04/13/2015  CLINICAL DATA:  Open wound of second toe. EXAM: RIGHT FOOT COMPLETE - 3+ VIEW COMPARISON:  April 27, 2013. FINDINGS: Vascular calcifications are noted. Lytic destruction of the distal portion of the second proximal phalanx is noted consistent with osteomyelitis. No other acute bony abnormality is noted. No radiopaque foreign body is noted. IMPRESSION: Lytic destruction of the second proximal phalanx is noted consistent with acute osteomyelitis. Electronically Signed   By: Marijo Conception, M.D.   On: 04/13/2015 19:47     Assessment/Plan   ICD-9-CM ICD-10-CM   1. Failure to thrive in adult 783.7 R62.7   2. End stage renal disease (HCC) 585.6 N18.6   3. Peripheral vascular disease (HCC) 443.9 I73.9   4. Chronic bronchitis, unspecified chronic bronchitis type ([place]) 491.9 J42   5. 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 Foley catheter in place V45.89 Z92.89   7. 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 Severe protein-calorie malnutrition ([place]) 262 E43   9. Chronic pain 338.29 G89.29   10. Paroxysmal  atrial fibrillation (HCC) 427.31 I48.0     Hospice referral pending  Cont current meds as ordered. Control pain  GOAL: comfort care.  Communicated with pt and nursing.  Will follow  Emali Heyward S. Perlie Gold  [facility] [address] [place], [place] [address] [phone] Cell (Monday-Friday 8 AM - 5 PM) [phone] After 5 PM and follow prompts

## 2015-05-14 DEATH — deceased

## 2016-07-08 IMAGING — DX DG FOOT COMPLETE 3+V*R*
3 series · 3 of 3 positions shown · non-contrast
Comparison: April 27, 2013.

CLINICAL DATA: Open wound of second toe.

EXAM:
RIGHT FOOT COMPLETE - 3+ VIEW

[x foot obl right]
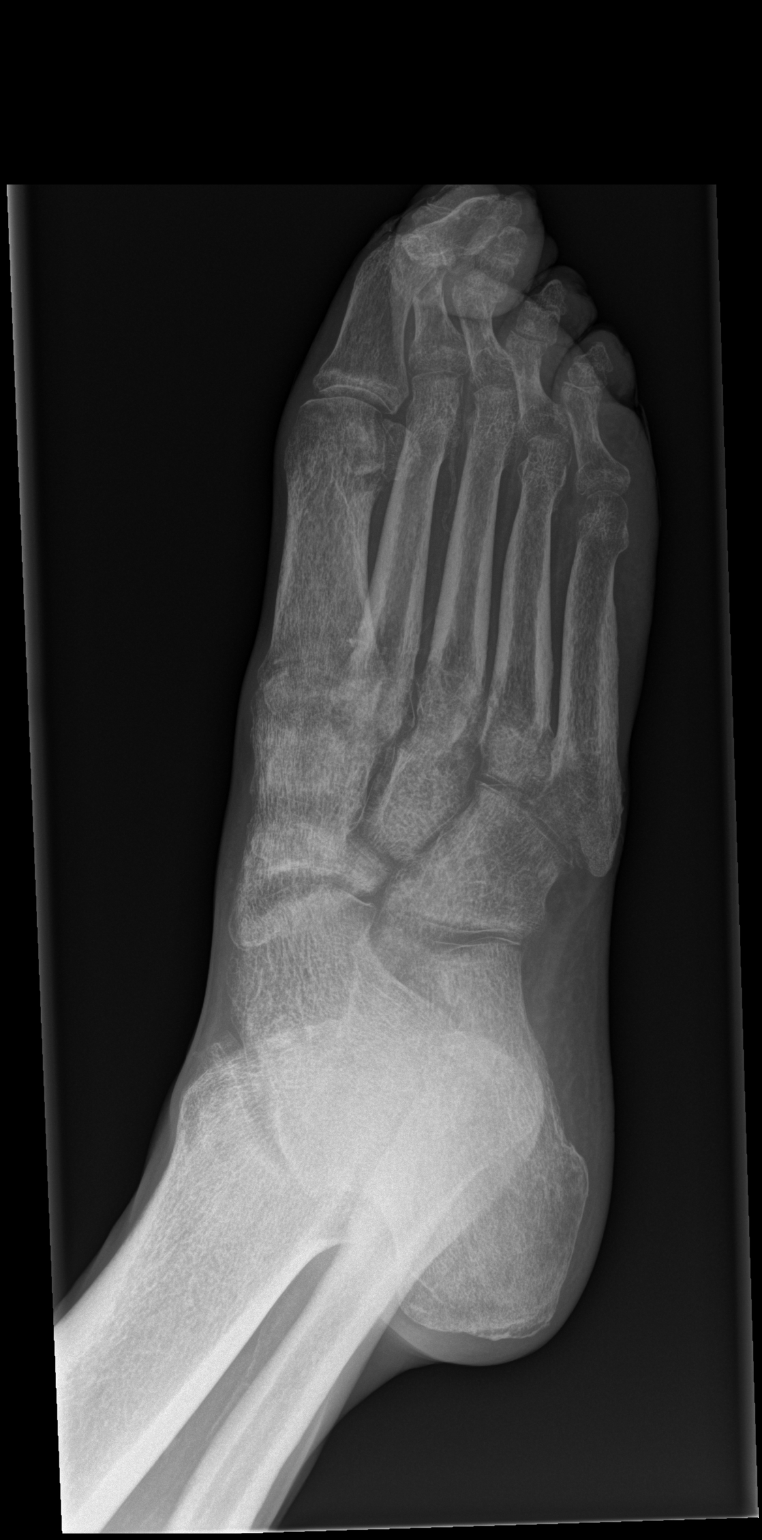

[x foot lat right]
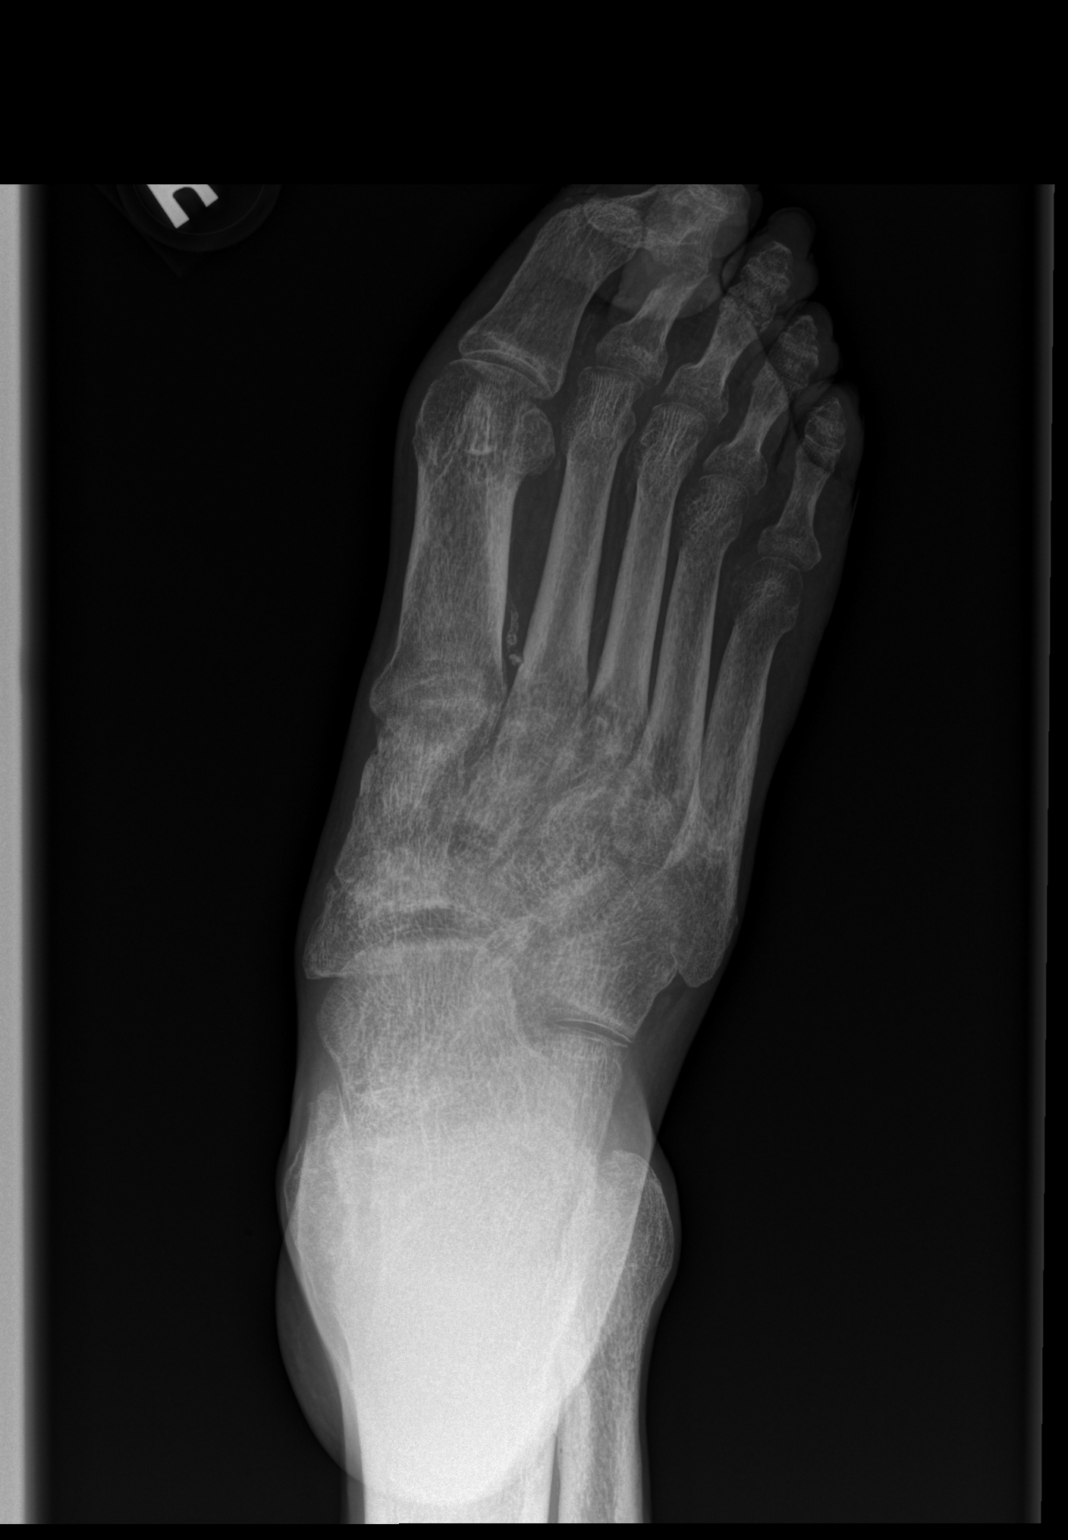

[w foot lat right]
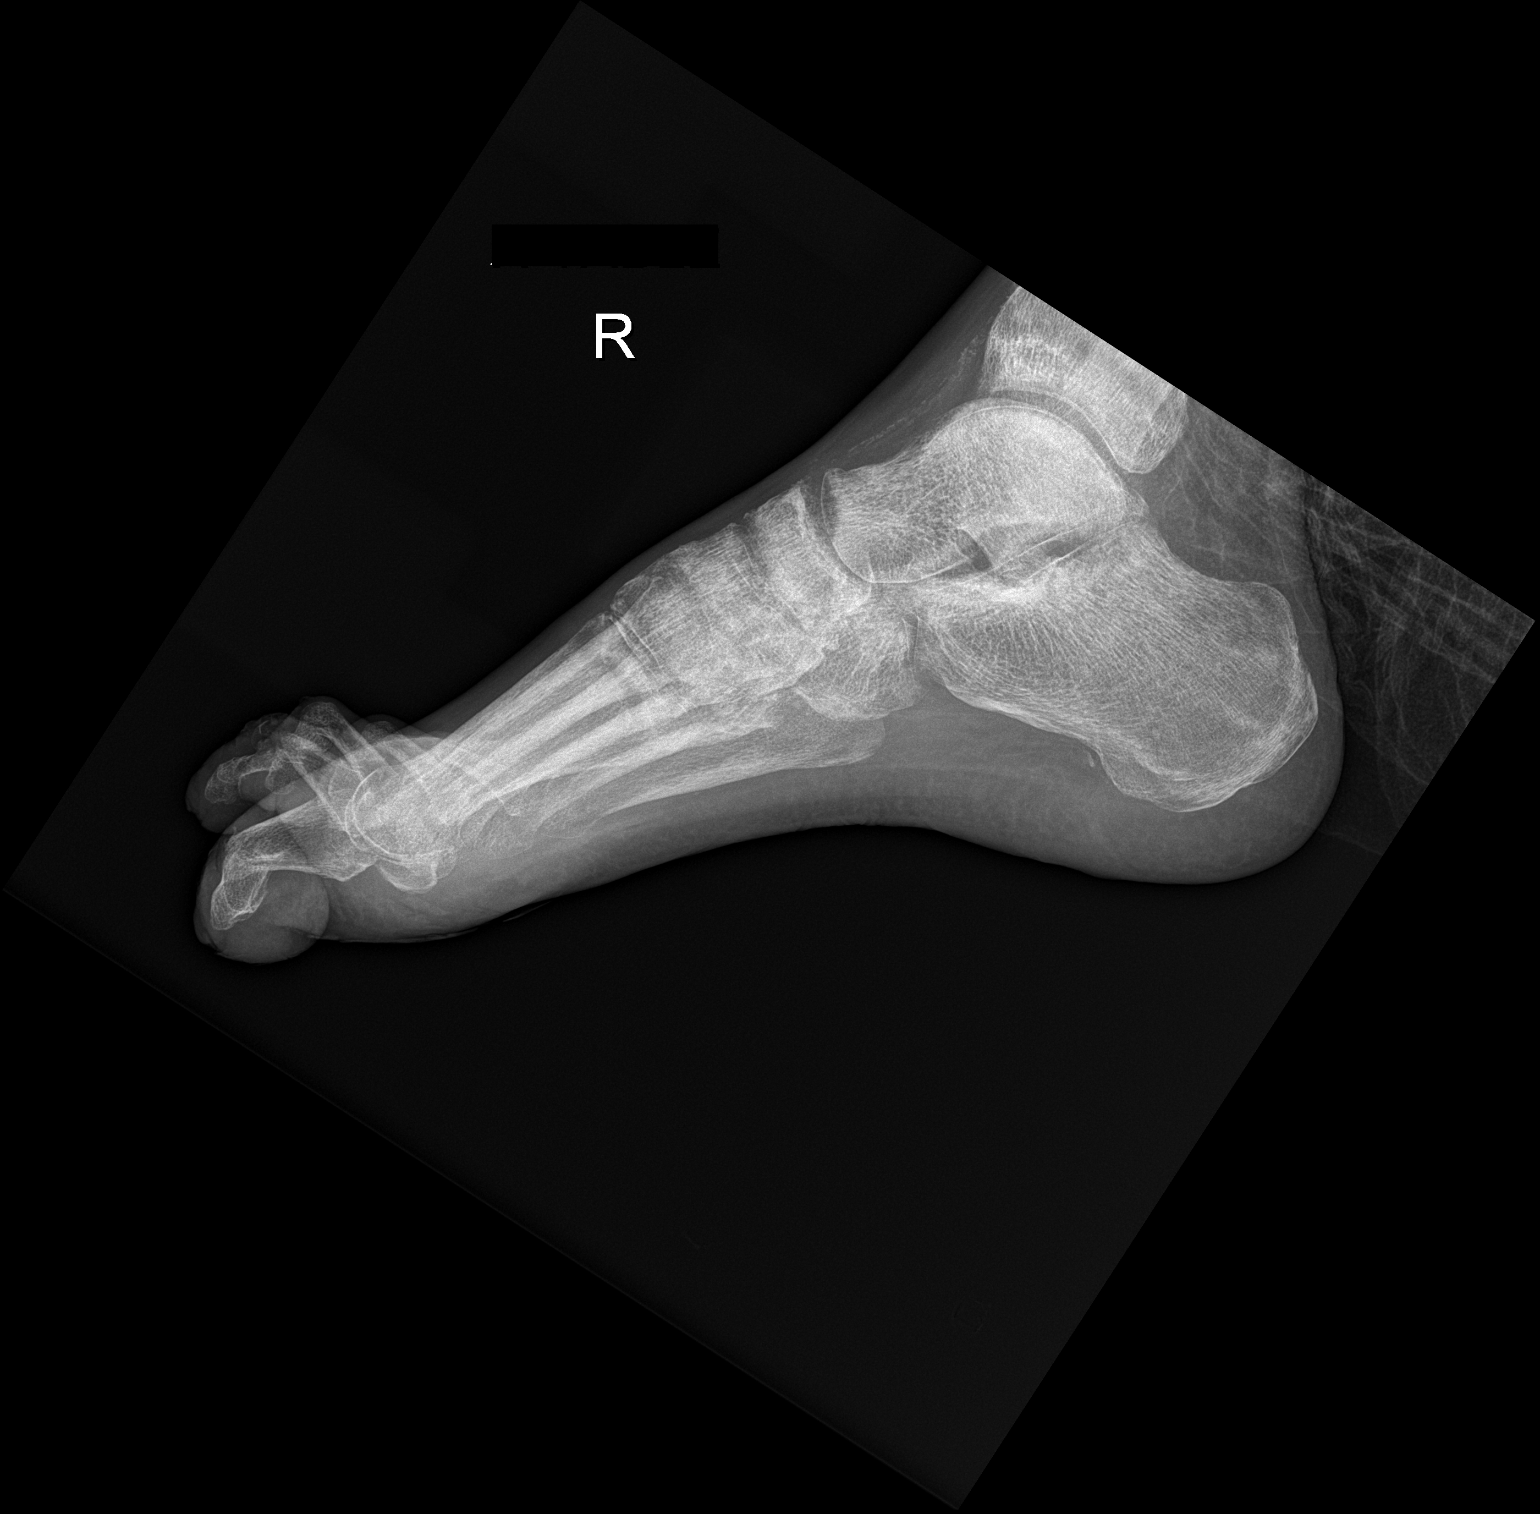

[3 of 3 positions shown; findings below may reference images not displayed]

FINDINGS: Vascular calcifications are noted. Lytic destruction of the distal
portion of the second proximal phalanx is noted consistent with
osteomyelitis. No other acute bony abnormality is noted. No
radiopaque foreign body is noted.
IMPRESSION: Lytic destruction of the second proximal phalanx is noted consistent
with acute osteomyelitis.

## 2016-07-08 IMAGING — DX DG CHEST 2V
2 series · 2 of 2 positions shown · non-contrast
Comparison: March 02, 2015.

CLINICAL DATA: PICC line placement.

EXAM:
CHEST  2 VIEW

[chest lat]
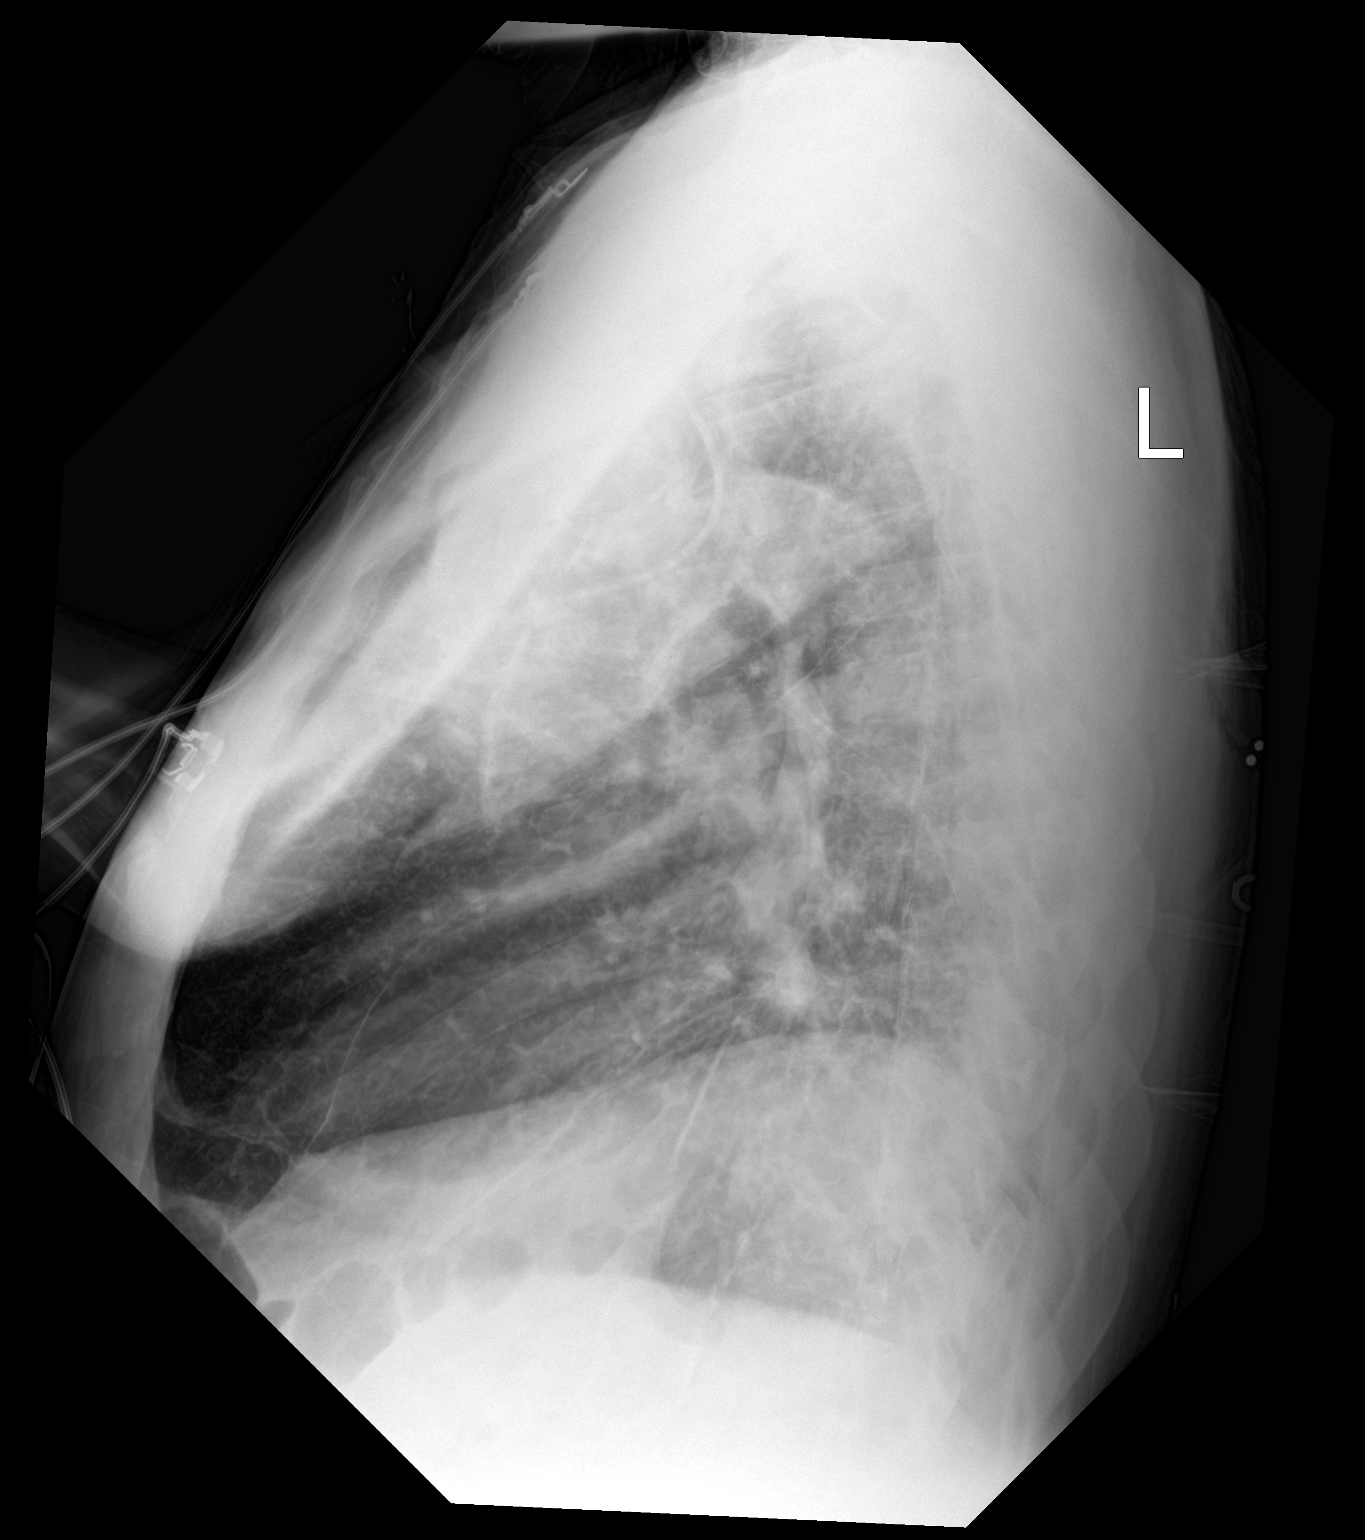

[chest ap]
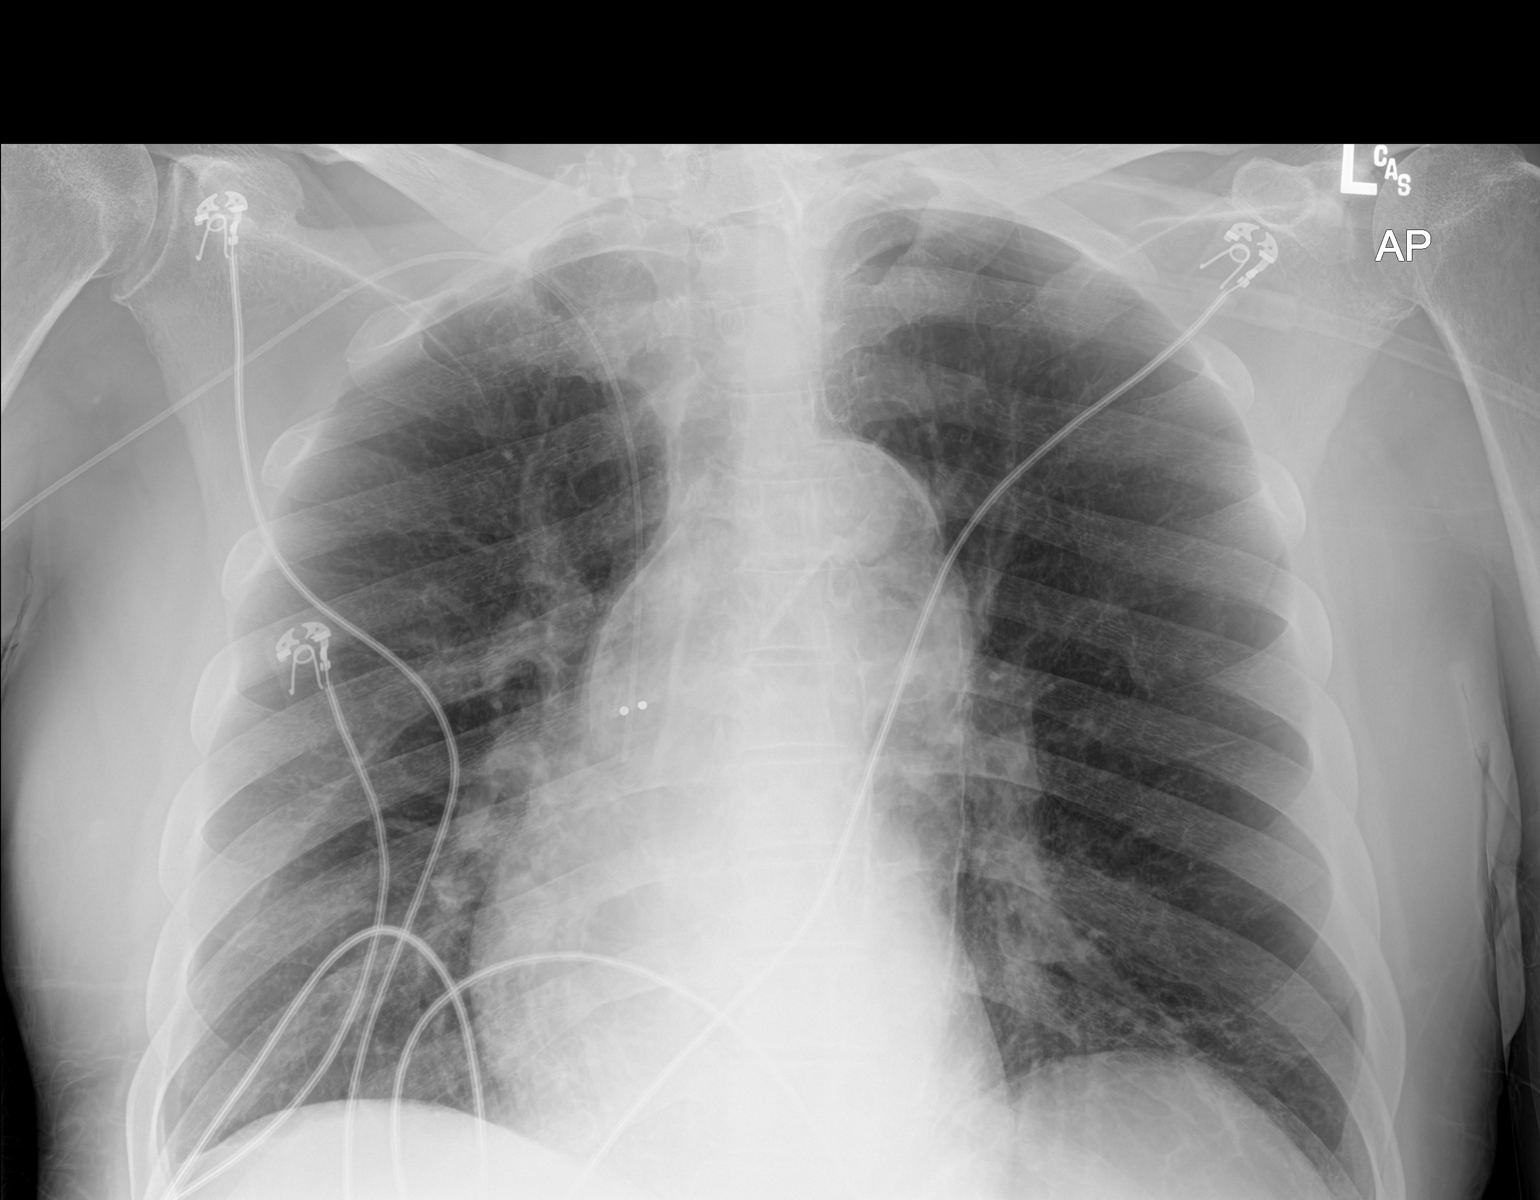

[2 of 2 positions shown; findings below may reference images not displayed]

FINDINGS: Stable cardiomediastinal silhouette. No pneumothorax or pleural
effusion is noted. Atherosclerosis of thoracic aorta is noted. No
acute pulmonary disease is noted. Right-sided PICC line is noted
with distal tip in expected position of the SVC. Bony thorax is
unremarkable.
IMPRESSION: Right-sided PICC line is noted with distal tip in expected position
of the SVC.
